# Patient Record
Sex: Female | Born: 1965 | Race: Black or African American | Hispanic: No | Marital: Married | Smoking: Never smoker
Health system: Southern US, Community
[De-identification: ages and names within clinical notes are randomized; demographics above are authoritative.]

## PROBLEM LIST (undated history)

## (undated) DIAGNOSIS — E079 Disorder of thyroid, unspecified: Secondary | ICD-10-CM

## (undated) DIAGNOSIS — E7800 Pure hypercholesterolemia, unspecified: Secondary | ICD-10-CM

## (undated) DIAGNOSIS — M1990 Unspecified osteoarthritis, unspecified site: Secondary | ICD-10-CM

## (undated) DIAGNOSIS — G43909 Migraine, unspecified, not intractable, without status migrainosus: Secondary | ICD-10-CM

## (undated) DIAGNOSIS — N939 Abnormal uterine and vaginal bleeding, unspecified: Secondary | ICD-10-CM

## (undated) DIAGNOSIS — F419 Anxiety disorder, unspecified: Secondary | ICD-10-CM

## (undated) DIAGNOSIS — I10 Essential (primary) hypertension: Secondary | ICD-10-CM

## (undated) DIAGNOSIS — M7500 Adhesive capsulitis of unspecified shoulder: Secondary | ICD-10-CM

## (undated) DIAGNOSIS — N83209 Unspecified ovarian cyst, unspecified side: Secondary | ICD-10-CM

## (undated) DIAGNOSIS — N840 Polyp of corpus uteri: Secondary | ICD-10-CM

## (undated) HISTORY — DX: Essential (primary) hypertension: I10

## (undated) HISTORY — DX: Disorder of thyroid, unspecified: E07.9

## (undated) HISTORY — PX: KNEE SURGERY: SHX244

## (undated) HISTORY — PX: COMBINED HYSTEROSCOPY DIAGNOSTIC / D&C: SUR297

## (undated) HISTORY — DX: Anxiety disorder, unspecified: F41.9

## (undated) HISTORY — DX: Migraine, unspecified, not intractable, without status migrainosus: G43.909

## (undated) HISTORY — DX: Polyp of corpus uteri: N84.0

## (undated) HISTORY — DX: Unspecified osteoarthritis, unspecified site: M19.90

## (undated) HISTORY — DX: Pure hypercholesterolemia, unspecified: E78.00

## (undated) HISTORY — PX: ROTATOR CUFF REPAIR: SHX139

## (undated) HISTORY — DX: Unspecified ovarian cyst, unspecified side: N83.209

## (undated) HISTORY — PX: WRIST SURGERY: SHX841

## (undated) HISTORY — DX: Adhesive capsulitis of unspecified shoulder: M75.00

## (undated) HISTORY — PX: INTRAUTERINE DEVICE INSERTION: SHX323

---

## 1991-03-04 HISTORY — PX: DIAGNOSTIC LAPAROSCOPY: SUR761

## 1997-06-05 ENCOUNTER — Ambulatory Visit (HOSPITAL_COMMUNITY): Admission: RE | Admit: 1997-06-05 | Discharge: 1997-06-05 | Payer: Self-pay | Admitting: Obstetrics and Gynecology

## 1997-09-19 ENCOUNTER — Ambulatory Visit (HOSPITAL_BASED_OUTPATIENT_CLINIC_OR_DEPARTMENT_OTHER): Admission: RE | Admit: 1997-09-19 | Discharge: 1997-09-19 | Payer: Self-pay | Admitting: *Deleted

## 1998-05-07 ENCOUNTER — Encounter: Payer: Self-pay | Admitting: Gastroenterology

## 1998-05-07 ENCOUNTER — Ambulatory Visit (HOSPITAL_COMMUNITY): Admission: RE | Admit: 1998-05-07 | Discharge: 1998-05-07 | Payer: Self-pay | Admitting: Gastroenterology

## 1998-09-07 ENCOUNTER — Ambulatory Visit (HOSPITAL_COMMUNITY): Admission: RE | Admit: 1998-09-07 | Discharge: 1998-09-07 | Payer: Self-pay | Admitting: Obstetrics and Gynecology

## 1999-02-03 ENCOUNTER — Encounter: Payer: Self-pay | Admitting: Gynecology

## 1999-02-03 ENCOUNTER — Inpatient Hospital Stay (HOSPITAL_COMMUNITY): Admission: AD | Admit: 1999-02-03 | Discharge: 1999-02-03 | Payer: Self-pay | Admitting: Gynecology

## 1999-03-14 ENCOUNTER — Other Ambulatory Visit: Admission: RE | Admit: 1999-03-14 | Discharge: 1999-03-14 | Payer: Self-pay | Admitting: Internal Medicine

## 1999-03-28 ENCOUNTER — Encounter: Admission: RE | Admit: 1999-03-28 | Discharge: 1999-03-28 | Payer: Self-pay | Admitting: Internal Medicine

## 1999-03-28 ENCOUNTER — Ambulatory Visit (HOSPITAL_COMMUNITY): Admission: RE | Admit: 1999-03-28 | Discharge: 1999-03-28 | Payer: Self-pay | Admitting: Infectious Diseases

## 1999-04-09 ENCOUNTER — Encounter: Admission: RE | Admit: 1999-04-09 | Discharge: 1999-04-09 | Payer: Self-pay | Admitting: Internal Medicine

## 1999-05-06 ENCOUNTER — Ambulatory Visit (HOSPITAL_COMMUNITY): Admission: RE | Admit: 1999-05-06 | Discharge: 1999-05-06 | Payer: Self-pay | Admitting: Infectious Diseases

## 1999-05-06 ENCOUNTER — Encounter: Admission: RE | Admit: 1999-05-06 | Discharge: 1999-05-06 | Payer: Self-pay | Admitting: Infectious Diseases

## 1999-05-27 ENCOUNTER — Encounter: Admission: RE | Admit: 1999-05-27 | Discharge: 1999-05-27 | Payer: Self-pay | Admitting: Internal Medicine

## 1999-06-15 ENCOUNTER — Inpatient Hospital Stay (HOSPITAL_COMMUNITY): Admission: AD | Admit: 1999-06-15 | Discharge: 1999-06-15 | Payer: Self-pay | Admitting: Gynecology

## 1999-06-21 ENCOUNTER — Encounter: Payer: Self-pay | Admitting: Gynecology

## 1999-06-21 ENCOUNTER — Inpatient Hospital Stay (HOSPITAL_COMMUNITY): Admission: AD | Admit: 1999-06-21 | Discharge: 1999-06-21 | Payer: Self-pay | Admitting: *Deleted

## 1999-07-09 ENCOUNTER — Encounter: Admission: RE | Admit: 1999-07-09 | Discharge: 1999-10-07 | Payer: Self-pay | Admitting: Gynecology

## 1999-07-23 ENCOUNTER — Inpatient Hospital Stay (HOSPITAL_COMMUNITY): Admission: AD | Admit: 1999-07-23 | Discharge: 1999-07-23 | Payer: Self-pay | Admitting: Gynecology

## 1999-08-06 ENCOUNTER — Inpatient Hospital Stay (HOSPITAL_COMMUNITY): Admission: AD | Admit: 1999-08-06 | Discharge: 1999-08-08 | Payer: Self-pay | Admitting: Gynecology

## 1999-09-14 ENCOUNTER — Inpatient Hospital Stay (HOSPITAL_COMMUNITY): Admission: AD | Admit: 1999-09-14 | Discharge: 1999-09-14 | Payer: Self-pay | Admitting: Gynecology

## 1999-09-26 ENCOUNTER — Inpatient Hospital Stay (HOSPITAL_COMMUNITY): Admission: AD | Admit: 1999-09-26 | Discharge: 1999-09-29 | Payer: Self-pay | Admitting: Gynecology

## 1999-11-14 ENCOUNTER — Other Ambulatory Visit: Admission: RE | Admit: 1999-11-14 | Discharge: 1999-11-14 | Payer: Self-pay | Admitting: Gynecology

## 2000-11-18 ENCOUNTER — Other Ambulatory Visit: Admission: RE | Admit: 2000-11-18 | Discharge: 2000-11-18 | Payer: Self-pay | Admitting: Obstetrics and Gynecology

## 2001-01-26 ENCOUNTER — Emergency Department (HOSPITAL_COMMUNITY): Admission: EM | Admit: 2001-01-26 | Discharge: 2001-01-27 | Payer: Self-pay | Admitting: Emergency Medicine

## 2001-01-26 ENCOUNTER — Encounter: Payer: Self-pay | Admitting: Emergency Medicine

## 2001-07-12 ENCOUNTER — Emergency Department (HOSPITAL_COMMUNITY): Admission: EM | Admit: 2001-07-12 | Discharge: 2001-07-12 | Payer: Self-pay | Admitting: Emergency Medicine

## 2001-07-12 ENCOUNTER — Encounter: Payer: Self-pay | Admitting: Emergency Medicine

## 2002-01-16 ENCOUNTER — Emergency Department (HOSPITAL_COMMUNITY): Admission: EM | Admit: 2002-01-16 | Discharge: 2002-01-16 | Payer: Self-pay | Admitting: Emergency Medicine

## 2002-01-16 ENCOUNTER — Encounter: Payer: Self-pay | Admitting: Emergency Medicine

## 2002-11-29 ENCOUNTER — Other Ambulatory Visit: Admission: RE | Admit: 2002-11-29 | Discharge: 2002-11-29 | Payer: Self-pay | Admitting: Obstetrics and Gynecology

## 2003-06-21 ENCOUNTER — Emergency Department (HOSPITAL_COMMUNITY): Admission: EM | Admit: 2003-06-21 | Discharge: 2003-06-21 | Payer: Self-pay | Admitting: Emergency Medicine

## 2003-11-30 ENCOUNTER — Other Ambulatory Visit: Admission: RE | Admit: 2003-11-30 | Discharge: 2003-11-30 | Payer: Self-pay | Admitting: Obstetrics and Gynecology

## 2004-12-04 ENCOUNTER — Other Ambulatory Visit: Admission: RE | Admit: 2004-12-04 | Discharge: 2004-12-04 | Payer: Self-pay | Admitting: Obstetrics and Gynecology

## 2006-01-01 ENCOUNTER — Other Ambulatory Visit: Admission: RE | Admit: 2006-01-01 | Discharge: 2006-01-01 | Payer: Self-pay | Admitting: Obstetrics and Gynecology

## 2006-03-03 HISTORY — PX: OOPHORECTOMY: SHX86

## 2006-05-15 ENCOUNTER — Ambulatory Visit (HOSPITAL_COMMUNITY): Admission: RE | Admit: 2006-05-15 | Discharge: 2006-05-15 | Payer: Self-pay | Admitting: Obstetrics and Gynecology

## 2006-05-19 ENCOUNTER — Encounter (INDEPENDENT_AMBULATORY_CARE_PROVIDER_SITE_OTHER): Payer: Self-pay | Admitting: *Deleted

## 2006-05-19 ENCOUNTER — Inpatient Hospital Stay (HOSPITAL_COMMUNITY): Admission: RE | Admit: 2006-05-19 | Discharge: 2006-05-21 | Payer: Self-pay | Admitting: Obstetrics and Gynecology

## 2007-01-25 ENCOUNTER — Other Ambulatory Visit: Admission: RE | Admit: 2007-01-25 | Discharge: 2007-01-25 | Payer: Self-pay | Admitting: Obstetrics and Gynecology

## 2007-08-24 ENCOUNTER — Ambulatory Visit (HOSPITAL_COMMUNITY): Admission: RE | Admit: 2007-08-24 | Discharge: 2007-08-24 | Payer: Self-pay | Admitting: Obstetrics and Gynecology

## 2007-09-16 ENCOUNTER — Encounter: Payer: Self-pay | Admitting: Obstetrics and Gynecology

## 2007-09-16 ENCOUNTER — Ambulatory Visit (HOSPITAL_BASED_OUTPATIENT_CLINIC_OR_DEPARTMENT_OTHER): Admission: RE | Admit: 2007-09-16 | Discharge: 2007-09-16 | Payer: Self-pay | Admitting: Obstetrics and Gynecology

## 2008-02-29 ENCOUNTER — Ambulatory Visit: Payer: Self-pay | Admitting: Obstetrics and Gynecology

## 2008-02-29 ENCOUNTER — Encounter: Payer: Self-pay | Admitting: Obstetrics and Gynecology

## 2008-02-29 ENCOUNTER — Other Ambulatory Visit: Admission: RE | Admit: 2008-02-29 | Discharge: 2008-02-29 | Payer: Self-pay | Admitting: Obstetrics and Gynecology

## 2008-06-24 ENCOUNTER — Emergency Department (HOSPITAL_COMMUNITY): Admission: EM | Admit: 2008-06-24 | Discharge: 2008-06-24 | Payer: Self-pay | Admitting: Family Medicine

## 2008-10-09 ENCOUNTER — Ambulatory Visit: Payer: Self-pay | Admitting: Obstetrics and Gynecology

## 2009-03-20 ENCOUNTER — Other Ambulatory Visit: Admission: RE | Admit: 2009-03-20 | Discharge: 2009-03-20 | Payer: Self-pay | Admitting: Obstetrics and Gynecology

## 2009-03-20 ENCOUNTER — Ambulatory Visit: Payer: Self-pay | Admitting: Obstetrics and Gynecology

## 2009-03-27 ENCOUNTER — Ambulatory Visit: Payer: Self-pay | Admitting: Obstetrics and Gynecology

## 2010-03-21 ENCOUNTER — Ambulatory Visit
Admission: RE | Admit: 2010-03-21 | Discharge: 2010-03-21 | Payer: Self-pay | Source: Home / Self Care | Attending: Obstetrics and Gynecology | Admitting: Obstetrics and Gynecology

## 2010-03-21 ENCOUNTER — Other Ambulatory Visit
Admission: RE | Admit: 2010-03-21 | Discharge: 2010-03-21 | Payer: Self-pay | Source: Home / Self Care | Admitting: Obstetrics and Gynecology

## 2010-03-21 ENCOUNTER — Other Ambulatory Visit: Payer: Self-pay | Admitting: Obstetrics and Gynecology

## 2010-06-16 ENCOUNTER — Observation Stay (HOSPITAL_COMMUNITY)
Admission: EM | Admit: 2010-06-16 | Discharge: 2010-06-17 | Disposition: A | Discharge status: Home / Self Care | Payer: 59 | Source: Ambulatory Visit | Attending: Emergency Medicine | Admitting: Emergency Medicine

## 2010-06-16 ENCOUNTER — Emergency Department (HOSPITAL_COMMUNITY): Payer: 59

## 2010-06-16 DIAGNOSIS — E119 Type 2 diabetes mellitus without complications: Secondary | ICD-10-CM | POA: Insufficient documentation

## 2010-06-16 DIAGNOSIS — E7800 Pure hypercholesterolemia, unspecified: Secondary | ICD-10-CM | POA: Insufficient documentation

## 2010-06-16 DIAGNOSIS — R079 Chest pain, unspecified: Principal | ICD-10-CM | POA: Insufficient documentation

## 2010-06-16 DIAGNOSIS — I10 Essential (primary) hypertension: Secondary | ICD-10-CM | POA: Insufficient documentation

## 2010-06-16 LAB — COMPREHENSIVE METABOLIC PANEL
ALT: 11 U/L (ref 0–35)
Albumin: 3.5 g/dL (ref 3.5–5.2)
Alkaline Phosphatase: 60 U/L (ref 39–117)
BUN: 15 mg/dL (ref 6–23)
Calcium: 9 mg/dL (ref 8.4–10.5)
Total Bilirubin: 0.6 mg/dL (ref 0.3–1.2)
Total Protein: 7.1 g/dL (ref 6.0–8.3)

## 2010-06-16 LAB — POCT CARDIAC MARKERS: Myoglobin, poc: 55.5 ng/mL (ref 12–200)

## 2010-06-16 LAB — CBC
HCT: 36.1 % (ref 36.0–46.0)
MCHC: 33.5 g/dL (ref 30.0–36.0)
MCV: 82.2 fL (ref 78.0–100.0)
RBC: 4.39 MIL/uL (ref 3.87–5.11)
WBC: 8 10*3/uL (ref 4.0–10.5)

## 2010-06-17 DIAGNOSIS — R072 Precordial pain: Secondary | ICD-10-CM

## 2010-06-17 LAB — POCT CARDIAC MARKERS
CKMB, poc: <1 ng/mL — ABNORMAL LOW (ref 1.0–8.0)
CKMB, poc: <1 ng/mL — ABNORMAL LOW (ref 1.0–8.0)
Myoglobin, poc: 35.1 ng/mL (ref 12–200)
Troponin i, poc: <0.05 ng/mL (ref 0.00–0.09)

## 2010-07-16 NOTE — Op Note (Signed)
NAME:  [name], [name]             ACCOUNT NO.:  [id number]   MEDICAL RECORD NO.:  [id number]          PATIENT TYPE:  AMB   LOCATION:  [facility]                         FACILITY:  [facility]   PHYSICIAN:  Daniel L. Gottsegen, M.D.DATE OF BIRTH:  1965/11/01   DATE OF PROCEDURE:  09/16/2007  DATE OF DISCHARGE:                               OPERATIVE REPORT   PREOPERATIVE DIAGNOSES:  1. Dysfunctional uterine bleeding.  2. Endometrial polyp.   POSTOPERATIVE DIAGNOSES:  1. Dysfunctional uterine bleeding.  2. Endometrial polyp.   OPERATION:  1. D&C.  2. Hysteroscopy.  3. Insertion of Marina IUD.  4. Excision of endometrial polyp.   SURGEON:  Dr. Eda Paschal   ANESTHESIA:  General.   INDICATIONS:  The patient is a 45 year old gravida 2, para 1, AB 1 who  had presented in the office with dysfunctional uterine bleeding with an  IUD in place.  She underwent a saline infusion histogram.  At the time  of the saline infusion histogram, it appeared that she was expelling her  [name], and the IUD was actually in the upper endocervical canal.  It was removed; however, she also had an endometrial polyp, and this was  seen at the time of saline infusion histogram.  She now enters the  hospital for excision of endometrial polyp as well as reinsertion of her  [name]   FINDINGS:  External is normal; BUS is normal; vaginal is normal; cervix  is clean.  Uterus is normal size and shape without any descensus.  Adnexa failed to reveal any masses.  At the time of hysteroscopy, the  patient had 2 endometrial polyps.  One was coming off the anterior wall  of the fundus and was about 1 cm.  One was little bit less than that and  was coming off the posterior wall of the fundus.  Other than this, top  of the fundus, tubal ostia, anterior and posterior walls of the fundus,  lower uterine segment and endocervical canal were free of disease.   PROCEDURE:  After adequate general endotracheal anesthesia, the  patient  was placed in the dorsal lithotomy position, prepped and draped in the  usual sterile manner.  A single-tooth tenaculum was placed in the  anterior lip of the cervix.  The cervix was dilated to a #31 Pratt  dilator.  A hysteroscopic resectoscope was introduced; 3% sorbitol was  used to expand each intrauterine cavity, and a camera was used for  magnification.  Using a 90-degree wire loop, the 2 polyps could be  sharply excised.  Endometrial curettings were also obtained.  She was  rehysteroscoped at this  point, and the cavity appeared to be completely clean.  Fluid deficit  was negligible.  Blood loss was minimal.  After the procedure been  terminated, a [name] was inserted without difficulty.  The patient  left the operating room in satisfactory condition.      Daniel L. Eda Paschal, M.D.  Electronically Signed     DLG/MEDQ  D:  09/16/2007  T:  09/16/2007  Job:  [id number]

## 2010-07-19 NOTE — H&P (Signed)
[facility]  Patient:    [name], [name]                    MRN: [id number] Adm. Date:  [id number] Attending:  Tonye Royalty                         History and Physical  CHIEF COMPLAINT:                  Low abdominal pressure.  HISTORY OF PRESENT ILLNESS:       The patient is a 45 year old, gravida 2, para 0, AB 1, with last menstrual period January 05, 1999, estimated date of confinement October 11, 1999.  The patient currently is 34-1/2 weeks estimated gestational age.  She presented to [facility] complaining of low abdominal pressure earlier today.  The patient has a known history of preterm labor for which she had been on terbutaline 2.5 mg p.o. q.4h.  She was placed on the monitor and was found to have contractions every 3 to 7 minutes apart. She was given p.o. fluids.  She was given terbutaline 0.25 mg subcutaneously and repeated administered at 1522 hours and then repeated at 1655 hours, and she still had some irregular contractions that were noted, and the patient was symptomatic.  Her pelvic examination demonstrated cervix to be long, closed, and posterior.  There was some thick white discharge for which a wet prep GC and chlamydia culture was obtained.  Of note, the patient also had been treated for urinary tract infection with Augmentin and afterwards was having a vaginal discharge and was found to have bacterial vaginosis for which she completed treatment with MetroGel approximately two days ago.  The patient also has a history of chronic hypertension for which she is on Aldomet 250 mg b.i.d. and has done well.  Her blood pressure on arrival to the hospital was 109/68.  PRENATAL COURSE:                  Significant for the fact that she had a therapeutic abortion in 1987.  She also had a false positive HIV for which she was followed in consultation with infectious disease at [facility]. The patient also, during  this pregnancy, developed gestational diabetes that did not respond to diet and was placed on insulin, and she currently takes 4 units of NPH at bedtime.  ALLERGIES:                        SULFA and CLINDAMYCIN.  REVIEW OF SYSTEMS:                See Hollister form.  PHYSICAL EXAMINATION:  VITAL SIGNS:                      Blood pressure 109/68, temperature 97.9, pulse 96, respirations 20.  HEENT:                            Unremarkable.  NECK:                             Supple.  Trachea midline.  No carotid bruits, no thyromegaly.  LUNGS:  Clear to auscultation without rhonchi or wheezes.  HEART:                            Regular rate and rhythm.  No murmurs or gallops.  BREASTS:                          Exam done in the first trimester and reported to be normal.  ABDOMEN:                          Gravid uterus.  Positive fetal heart tones.  PELVIC:                           Cervix long, closed, and posterior.  Thick, white vaginal discharge.  EXTREMITIES:                      Trace edema.  DTRs 1+, negative clonus.  PRENATAL LABORATORY DATA:         B positive blood type, negative antibody screen.  Sickle cell trait was negative.  VDRL was nonreactive.  Rubella with evidence of immunity.  Hepatitis B surface antigen was negative.  HIV initially thought positive, on repeat was negative.  Pap smear was normal. Alpha-fetoprotein was normal and patient with abnormal diabetes screen and three-hour GTP.  ASSESSMENT:                       A 45 year old gravida 2, para 0, AB 1 at 30-1/2 weeks estimated gestational age, not responding to outpatient treatment with oral tocolytics.   She will be admitted for IV hydration and magnesium sulfate tocolysis with 4 g bolus followed by 2 g per hour.  GC and chlamydia wet prep obtained.  Since patient was recently treated with Augmentin, will hold off on placing her on any antibiotic until the cultures become  available unless there is any evidence of any imminent delivery or anything shows differently on the wet prep.  We will continue to monitor her blood sugars in the hospital consisting of fasting blood sugar and two-hour postprandial, and she will receive her 4 units of NPH this evening.  We will hold off her p.o. terbutaline.  She will continue with her p.o. prenatal vitamins and iron supplementation due to her recently diagnosed anemia and continue her Aldomet 250 mg b.i.d. for hypertension.  The patients fetal heart rate tracing was found to be reassuring.  All this was related to the patient and will follow accordingly.  PLAN:                             As per Assessment above. DD:  08/06/99 TD:  08/06/99 Job: [id number]

## 2010-07-19 NOTE — Op Note (Signed)
[place]  Patient:    SKYLLAR, NOTARIANNI Visit Number: [id number] MRN: [id number]          Service Type: EMS Location: ED Attending Physician:  Sandi Raveling Proc. Date: 09/19/97 Admit Date:  01/26/2001 Discharge Date: 01/27/2001   CC:         Jearld Adjutant, M.D.             Dario Guardian, M.D.                           Operative Report  PREOPERATIVE DIAGNOSIS:  Left knee medial meniscus tear, rule out medial plica and chondromalacia patella.  POSTOPERATIVE DIAGNOSIS: 1. Large inflamed medial and lateral plicae. 2. 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  OPERATION:  Left knee operative arthroscopy with excision medial and lateral plicae with arthroscopic lateral retinacular release.  SURGEON:  Jearld Adjutant, M.D.  ASSISTANT:  Currie Paris. Thedore Mins.  ANESTHESIA:  General with LMA.  CULTURES:  None.  DRAINS:  None.  ESTIMATED BLOOD LOSS:  Without.  TOURNIQUET TIME: without.  PATHOLOGIC FINDINGS AND HISTORY:   [place] came in to [place] with a new problem with her knee August 09, 1997.  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  She went to [employer] at [employer]. Examination revealed range of motion from 10 degrees to 70 degrees when the knee was relaxed.  [place]rays was difficult because of pain.  Tender at both joint lines.  Had a 1-2+ effusion, negative Lachman, negative post-anterior drawer, stable to varus, valgus stressing.  Could not do a pivot-shift initially.  X-rays were negative.  We felt she had an internal derangement.  We needed to rule out a medial meniscus tear, data at ACL.  We injected the med portal with 3:12 Depo Medrol/Marcaine.  Did not aspirate any blood or any significant joint fluid.  Put her in a knee  immobilizer, put her Vicodin and sent her to physical therapy.  She came back in one week.  She was unable to bear weight and was having difficulty bending the knee, made zero progress in physical therapy.  Unable to weight bear, pain medially deep inside the joint.  Exam was still range of motion only 10-80 degrees, no laxity of her ligaments and we obtained a MRI scan and held on physical therapy.  She came back August 25, 1997.  MRI scan was basically normal.  The only abnormality being thinning of the tibial articular cartilage, possibly degenerative in nature.  The medial lateral menisci appeared normal and there was no cruciate ligament tear.  She complained of pain with weight bearing of the knee and came in with crutches and a knee immobilizer.  There was no ACL laxity.  She could flex the knee to about 100 degrees, we still thought she could have an internal derangement.  We placed her on Vioxx, knee immobilizer and saw her back in 10 more days. By September 06, 1997, she came back in, had gone to physical therapy, no change in range of motion or pain.  Slight decrease in swelling, still had marked tenderness at the medial joint line over the medial femoral condyle and medial retinaculum.  Our physical therapy stated, she made no progress in  physical therapy. The knee gave way over the weekend.  She had pain with flexion past 100 degrees in the medial compartment, no significant effusion.  I felt there was a component of chondromalacia and I thought she either had a medial meniscus tear versus an inflamed medial plica.  She had made absolutely no progress despite aggressive conservative treatment and we elected to proceed with diagnostic and operative arthroscopy.  At surgery today, we found a huge, thickened, inflamed medial and lateral plica in the medial lateral gutters that had a lot of adipose tissue in it and a fibrous base.  This also came into the medial joint line  and corresponded with her area of maximal tenderness.  This was all shaved out on both sides.  The joint surfaces all looked good, except for grade I chondromalacia patella with softening of the lateral facet, obvious tilting and lateral tracking and a very tight lateral retinaculum.  ACL was intact.  Both menisci were intact.  We did an arthroscopic lateral retinacular release.  DESCRIPTION OF PROCEDURE:  With adequate anesthesia obtained using LMA technique and 1 g Ancef given IV prophylaxis, the patient was placed in the supine position.  The left lower extremity was prepped from the malleoli to the leg holder in a standard fashion.  After standard prepping and draping, a superolateral inflow portal is made.  The knee was insufflated with normal saline with the arthroscopic pump.  Medial, lateral scope portals were then made, the joint was thoroughly inspected. We then shaved out the medial plica cauterized bleeding points, probed the ACL and medial meniscus.  Reversed portals, shaved out the lateral plica, cauterized bleeding point, assessed the tilt and track of the lateral patella, as well as, the posterior surface and then did a lateral retinacular release with arthroscopic Bovie from the vastus lateralis to the joint line. Good release was obtained.  We then Bovied the bleeding points, the knee was irrigated through the scope.   Then 0.5% Marcaine was injected in and about the portals.  The portals were left open.  A bulky, sterile, compressive dressing was applied with lateral compression for tamponade and the patient, having tolerated the procedure well was awakened, taken to recovery room in satisfactory condition to be discharged per outpatient routine, crutches and weight bearing as tolerated.  Told to call the office for recheck tomorrow. Attending Physician:  Sandi Raveling DD:  09/19/97 TD:  09/19/97 Job: [id number]

## 2010-07-19 NOTE — Consult Note (Signed)
[facility]  Patient:    [name], [name]                    MRN: [id number] Adm. Date:  [id number] Attending:  Merrily Pew                          Consultation Report  CHIEF COMPLAINT:              Contractions.  HISTORY OF PRESENT ILLNESS:   The patient is a 45 year old, G2, P0, AB 1, female, [redacted] weeks gestation.  Pregnancy complicated by chronic hypertension, on Aldomet 250 b.i.d. and gestational diabetes on NPH 10 units at bedtime. History of preterm labor requiring magnesium sulfate and subsequent oral terbutaline which she stopped this past week.  The patient notes since last evening she has been contracting and presents for evaluation.  She denies rupture of membranes or bleeding.  PAST MEDICAL HISTORY:         Elevated blood pressure.  PAST SURGICAL HISTORY:        TAB.  Knee surgery.  ALLERGIES:                    SULFA, CLINDAMYCIN.  REVIEW OF SYSTEMS:            Noncontributory.  ADMISSION PHYSICAL EXAMINATION:  VITAL SIGNS:                  Temperature 98, blood pressure 119/81, respirations 20, pulse 86.  HEENT:                        Normal.  LUNGS:                        Clear.  CARDIAC:                      Regular rate.  No murmurs, rubs or gallops.  ABDOMEN:                      Gravid, vertex presentation.  PELVIC:                       Uterus is soft, nontender.  External monitor shows a reactive fetal tracing with contractions every 3-7 minutes.  Pelvic examination:  Cervix 50%, closed, -2, -3 station, vertex presentation.  No evidence of rupture of membranes or bleeding.  ASSESSMENT AND PLAN:          A 45 year old G2, P0, AB 1 female, [redacted] weeks gestation with a history of preterm contractions requiring tocolysis, recently stopped several days ago with contractions every 4-7 minutes.  The patient was initially checked three hours ago and was walked and re-monitored and now on recheck her cervix has  remained unchanged.  I reviewed with the patient this may be prodromal labor in the early labor versus false labor and reviewed the various options with her and we elected for a trial of one shot of subcu terbutaline, subsequent monitoring, and then discharge home where she lives approximately 5-8 minutes from here with Ambien 10 mg for attempted therapeutic rest.  The patient has an appointment in 48 hours in the office. I rescheduled for an office visit and NST.  If her contractions resolve and she is otherwise doing well with good fetal movement, she will monitor  and present 48 hours for reevaluation.  If contractions continue and increase in intensity she will represent for further evaluation.  Certainly if any evidence of rupture of membranes, bleeding or any other concerning symptoms such as decreased fetal movement then she knows to present ASAP for evaluation. DD:  09/14/99 TD:  09/14/99 Job: [id number]

## 2010-07-19 NOTE — Discharge Summary (Signed)
[facility]  Patient:    [name], [name]                    MRN: [id number] Adm. Date:  [id number] Disc. Date: [id number] Attending:  Tonye Royalty Dictator:   Antony Contras, [facility]                           Discharge Summary  DISCHARGE DIAGNOSES:          1. Intrauterine pregnancy at 34-1/2 weeks.                               2. Preterm labor not responding to outpatient                                  treatment with oral tocolytics.  HISTORY OF PRESENT ILLNESS:   The patient is a 45 year old, gravida 2, para 0-0-1-0, with an LMP of January 05, 1999, and an [facility] of October 11, 1999. The pregnancy has been complicated by chronic hypertension.  The patient takes Aldomet 250 mg b.i.d.  She also has gestational diabetes and preterm labor for which she has been prescribed terbutaline 2.5 mg p.o. q.4h.  PRENATAL LABORATORY DATA:     Blood type B+.  Sickle cell negative.  Antibody screen negative.  Rubella immune.  RPR, HBSAG, and HIV nonreactive.  MSAFP within normal limits.  HOSPITAL COURSE:              The patient presented to [facility] on August 06, 1999, complaining of low abdominal pressure.  She was placed on the monitor and was found to have contractions every three to seven minutes.  She was given p.o. fluids and terbutaline 0.25 mg subcutaneously, which was repeated x 2.  She continued to have some irregular contractions.  On pelvic exam, her cervix was long, thick, and closed.  At this point, she was admitted for IV hydration and magnesium sulfate tocolysis with a 4 g bolus followed by 2 g/hr.  GC, chlamydia, and wet prep were also obtained.  She will be continued on her 4 units of NPH Insulin which she received in the evening and her Aldomet 250 mg b.i.d.  The patient did respond to this treatment.  Her magnesium sulfate was to be discontinued on August 07, 1999.  Her cervical exam remained unchanged.  She was able to be discharged  on her second hospital day in satisfactory condition.  LABORATORY DATA:              CMET:  Normal.  CBC:  Hemoglobin 11.8, hematocrit 36.2, white blood cell count 10.1.  Her fasting blood sugar was 99 and two-hour of 105, 78, and 103.  DISCHARGE MEDICATIONS:        The patient is to continue her terbutaline q.4h. at home and continue her Aldomet.  FOLLOW-UP:                    Follow up in the office in one week. DD:  08/23/99 TD:  08/24/99 Job: [id number]

## 2010-07-19 NOTE — Discharge Summary (Signed)
[name], [name]             ACCOUNT NO.:  [id number]   MEDICAL RECORD NO.:  [id number]          PATIENT TYPE:  INP   LOCATION:  9317                          FACILITY:  [facility]   PHYSICIAN:  Daniel L. Gottsegen, M.D.DATE OF BIRTH:  10-05-1965   DATE OF ADMISSION:  05/19/2006  DATE OF DISCHARGE:  05/21/2006                               DISCHARGE SUMMARY   The patient is a 45 year old female with severe left lower quadrant pain  and large left ovarian cyst who was admitted to the hospital for  definitive surgery.  The patient had previous laparoscopy, had dense  pelvic adhesions on the left, and previous surgical comments were that  she required salpingo-oophorectomy at some point that it should be done  open.   HOSPITAL COURSE:  Because of the above she was admitted for exploratory  laparotomy with left salpingo-oophorectomy.  Findings at the time of  surgery were large endometrioma of the left ovary. Findings were  confined to the left adnexa and she underwent a left salpingo-  oophorectomy.  Postoperatively she did well and by the second  postoperative day she was ready for discharge.   DISCHARGE MEDICATIONS:  Vicodin 5/500 for pain relief. Regular diet and  ambulatory activity.  She will return to office in 3 weeks.  Wound care  is routine. She has a subcuticular suture present.   DISCHARGE DIAGNOSIS:  Endometrioma left ovary.   OPERATION:  Exploratory laparotomy with left salpingo-oophorectomy.  Pathology report not available at time of dictation.   CONDITION ON DISCHARGE:  Is improved.      Daniel L. Eda Paschal, M.D.  Electronically Signed     DLG/MEDQ  D:  05/21/2006  T:  05/21/2006  Job:  [id number]

## 2010-07-19 NOTE — Op Note (Signed)
[name], [name]             ACCOUNT NO.:  [id number]   MEDICAL RECORD NO.:  [id number]          PATIENT TYPE:  AMB   LOCATION:  SDC                           FACILITY:  [facility]   PHYSICIAN:  Daniel L. Gottsegen, M.D.DATE OF BIRTH:  1966-03-01   DATE OF PROCEDURE:  05/19/2006  DATE OF DISCHARGE:                               OPERATIVE REPORT   PREOPERATIVE DIAGNOSIS:  Left lower quadrant pain with left ovarian  cyst.   POSTOPERATIVE DIAGNOSIS:  Left lower quadrant pain with endometrioma of  left ovary.   OPERATION:  Exploratory laparotomy with left salpingo-oophorectomy.   SURGEONS:  Dr. Eda Paschal   FIRST ASSISTANT:  Dr. Lily Peer.   FINDINGS AT SURGERY:  The patient's left ovary was enlarged to  approximately 6 cm. In elevating in order to see the pathology the cyst  ruptured and drained obvious endometrioma type fluid.  The ovary and  tube were actually fairly free as opposed to how they had been at her  two previous laparoscopies. The uterus was normal mobile without any  pathology. Right ovary and tube were normal and mobile without any  pathology.  All endometriosis was limited to both the left ovary and  also there was what appeared to be burned out endometrial implants on  her sigmoid colon.  These could be teased off the sigmoid colon. There  was no evidence of diverticulitis. Ileocecal junction was identified.  She had normal appendix. Right and left upper quadrant were explored and  were normal.   PROCEDURE:  After adequate general endotracheal anesthesia the patient  was placed in supine position, prepped and draped in usual sterile  manner.  A Foley catheter was inserted in the patient's bladder.  The  patient had two previous laparoscopies and the surgeons note on the  second was that if she required salpingo-oophorectomy because of dense  adherence of sigmoid colon which had been taken down at second  laparoscopy, she should have a laparotomy and not  laparoscopic S&O.  As  a result of this a Pfannenstiel incision was made.  The fascia was  opened transversely.  The peritoneum was entered vertically.  Subcutaneous bleeders were clamped and bovied as encountered.  When the  peritoneal cavity was opened there was obvious enlargement the left  ovary.  It was adherent behind the uterus and the surgeon put his hand  underneath, elevated the cyst which ruptured endometriotic fluid  drained. The entire ovary and tube however were mobile and free from the  bowel. The round ligament was sutured with #1 chromic catgut.  The IP  ligament was identified, clamped and doubly suture with #1 chromic  catgut.  The ureter had been identified and was nowhere near the above.  The rest of the attachments of the ovary and tube to the broad ligament  and the uterus were clamped, cut and suture ligated with #1 chromic  catgut.  The specimen was sent to pathology for tissue diagnosis.  This  was the left ovary and tube.  There was some endometriotic implant on  the serosa of the sigmoid colon which could be teased off.  There was no  active disease and this was done. Exploration of the cul-de-sac,  vesicouterine fold of peritoneum, right ovary and tube failed to reveal  any other disease.  The rest of the abdomen was explored and was normal.  Two sponge counts were counts were correct.  Copious irrigation was done  with sterile saline. Peritoneum was closed with a running 0 Vicryl.  Sub  and super fascial spaces were copiously irrigated and the fascia was  closed with two running 0 Vicryls.  The skin was closed with a 3-0 plain  subcuticular suture.  Estimated blood loss for entire procedure was 100  mL with none replaced.  The patient tolerated procedure well and left  the operating  room in satisfactory condition draining clear urine from  her Foley catheter which was then removed.      Daniel L. Eda Paschal, M.D.  Electronically Signed     DLG/MEDQ  D:   05/19/2006  T:  05/19/2006  Job:  [id number]

## 2010-07-19 NOTE — Discharge Summary (Signed)
[facility]  Patient:    [name], [name]                    MRN: [id number] Adm. Date:  [id number] Disc. Date: [id number] Attending:  Merrily Pew Dictator:   Antony Contras, [facility]                           Discharge Summary  DISCHARGE DIAGNOSES:          1. Intrauterine pregnancy at 37-38 weeks.                               2. History of chronic hypertension.                               3. Insulin-dependent gestational diabetes.                               4. Preterm labor during the current pregnancy.  PROCEDURES:                   Vacuum-assisted vaginal delivery of viable infant over a midline episiotomy.  HISTORY OF PRESENT ILLNESS:   The patient is a 45 year old gravida 2, para 0-0-1-0, with LMP January 05, 1999, and [facility] October 11, 1999.  Prenatal risk factors included chronic hypertension, insulin-dependent gestational diabetes. The patient also had a false-positive HIV test which was negative on repeat, and she did receive follow-up per infectious disease clinic at [facility]. Other prenatal laboratories were as follows:  Blood type was B positive, antibody screen negative, sickle cell negative.  RPR, HBsAg, and HIV nonreactive.  MSAFP normal.  HOSPITAL COURSE:              The patient was admitted on September 26, 1999, with spontaneous rupture of membranes, increasing contractions, and clear fluid. The cervix on admission was tight fingertip, 50% effaced, -2 station.  She was augmented with Pitocin and progressed to complete dilatation and delivered an Apgars 8/9 female infant weighing 6 pounds 6 ounces over a midline episiotomy per vacuum-assisted vaginal delivery.  Postpartum course was normal.  She remained afebrile, had no difficulty with voiding.  CBC:  Hematocrit 30.6, hemoglobin 10.0, platelets 228.  Fasting blood sugar 59.  She was discharged on her second postpartum day.  CONDITION ON DISCHARGE:        Satisfactory.  FOLLOW-UP:                    In the office in six weeks.  MEDICATIONS:                  Continue with prenatal vitamins and iron. Motrin and Tylox for pain.  Continue with Aldomet 250 mg b.i.d. DD:  10/18/99 TD:  10/20/99 Job: [id number]

## 2010-07-19 NOTE — Consult Note (Signed)
[facility]  Patient:    [name], [name]                    MRN: [id number] Adm. Date:  [id number] Disc. Date: [id number] Attending:  Tonye Royalty                          Consultation Report  EMERGENCY ROOM CONSULTATION  HISTORY:                      The patient is a 45 year old with an estimated date of confinement of October 11, 1999 who is currently at 20-5/[redacted] weeks gestation and presented to the emergency room at [facility] this evening complaining of contractions and lower abdominal pressure which started at approximately 1400 hours at home this afternoon and worsened.  She was placed on the monitor and found to be contracting every three minutes apart.  Fetal heart rate baseline is 150 beats/minutes.  Her vital signs were as follows: temperature is 98.4, pulse 96, respirations 20 and blood pressure 128/69.  Patient with a history of chronic hypertension and had been on Aldomet 250 mg b.i.d. during her pregnancy.  Also, she was recently started on Augmentin 875 mg b.i.d. of a urinary tract infection.  When the patient was placed on the monitor, besides the contractions that were noted, a reassuring fetal heart tracing was noted.  The patient, with this pregnancy, has had bacterial vaginosis, which was treated earlier.  PHYSICAL EXAMINATION:  ABDOMEN:                      Soft and nontender.  PELVIC EXAM:                  Cervix was long, closed and posterior.  LABORATORY DATA:              Urinalysis was essentially negative, specific gravity was 1.005.  Her wet prep demonstrated a few bacteria and GC and Chlamydia as well as group B Strep culture are pending at the time of this dictation.  EMERGENCY DEPARTMENT COURSE:  The patient was given 500 cc of lactated Ringers and terbutaline 0.25 mg subcu every 20 minutes apart x 3, which arrested her contractions.  The remaining IV fluid will be administered to the patient and  she will be discharged home.  She will be released on terbutaline 2.5 mg p.o. q.4h. and was instructed to follow up in the office at the end of the week.  She is currently working a half-day schedule consisting of four hours per day and was given a note to stay off work for the remainder of this week, and then return to four hours per day if the patient feels comfortable and is no longer contracting.  Signs, symptoms and warning signs of premature contractions and premature labor were discussed with the patient so she is aware of same.  All questions were answered and we will follow accordingly. DD:  07/23/99 TD:  07/24/99 Job: [id number]

## 2010-07-19 NOTE — H&P (Signed)
[facility]  Patient:    [name], [name]                    MRN: [id number] Adm. Date:  [id number] Attending:  Merrily Pew                         History and Physical  CHIEF COMPLAINT:              Labor, spontaneous rupture of membranes.  HISTORY OF PRESENT ILLNESS:   A 44 year old G2 P0 AB1 female at 84-[redacted] weeks gestation being followed for history of chronic hypertension and insulin-dependent gestational diabetes, normal antepartum testing, with spontaneous rupture of membranes following increasing contractions today. Fluid was noted to be clear and she was confirmed to be ruptured in the office and was sent to the hospital for admission.  OBSTETRICAL HISTORY:          Significant for apparent false positive HIV screening.  She initially had a positive HIV screening with an indeterminate Western blot to follow-up.  She was seen by Dr. Cliffton Asters in the infectious disease clinic and subsequently had a negative HIV EIA, and his recommendation was no further testing or any need for empiric antiretroviral therapy.  PAST MEDICAL HISTORY:         Hypertension.  PAST SURGICAL HISTORY:        TAB.  ALLERGIES:                    SULFA, CLINDAMYCIN.  MEDICATIONS:                  1. Aldomet 250 b.i.d.                               2. NPH insulin 10 units at bedtime.  REVIEW OF SYSTEMS:            Noncontributory.  SOCIAL HISTORY:               Noncontributory.  FAMILY HISTORY:               Noncontributory.  ADMISSION PHYSICAL EXAMINATION:  VITAL SIGNS:                  Afebrile, vital signs stable.  HEENT:                        Normal.  LUNGS:                        Clear.  CARDIAC:                      Regular rate, no rubs, murmurs, or gallops.  ABDOMEN:                      Benign, gravid.  Vertex fetus, positive fetal heart tones.  PELVIC:                       Cervix tight fingertip, 50% effaced -2 station, gross  rupture of membranes, fluid noted to be clear.  ASSESSMENT:                   A 45 year old G2 P0 AB1 female, gestational diabetes, insulin-dependent with good control, chronic hypertensive on  Aldomet 250 b.i.d. with normal blood pressures, early labor with spontaneous rupture of membranes.  History of false positive HIV, no recommended therapy or intervention.  Will admit, labor, and deliver. DD:  09/26/99 TD:  09/27/99 Job: [id number]

## 2010-10-08 ENCOUNTER — Encounter: Payer: Self-pay | Admitting: *Deleted

## 2010-10-08 ENCOUNTER — Ambulatory Visit (INDEPENDENT_AMBULATORY_CARE_PROVIDER_SITE_OTHER): Payer: 59 | Admitting: Obstetrics and Gynecology

## 2010-10-08 ENCOUNTER — Encounter: Payer: Self-pay | Admitting: Obstetrics and Gynecology

## 2010-10-08 DIAGNOSIS — N949 Unspecified condition associated with female genital organs and menstrual cycle: Secondary | ICD-10-CM

## 2010-10-08 DIAGNOSIS — N938 Other specified abnormal uterine and vaginal bleeding: Secondary | ICD-10-CM

## 2010-10-08 NOTE — Progress Notes (Signed)
The patient came to see me today with a history of spotting for the last 7-10 days. She has had a [name] now for 3 years. After the first year she never saw any bleeding so this is a change.  Her abdomen is soft without guarding rebound or masses. Pelvic: External and BUN is within normal limits. Vaginal exam within normal limits. Cervix is clean with IUD string visible. The IUD is not sitting in the cervix. Uterus is normal size and shape. Adnexa failed to reveal any masses.  Assessment: Vaginal spotting  Plan: Patient reassured. If spotting persists she will call we will cycle for one month with birth control pills. She is hypertensive but I think one-month pills will be fine. I told the last thing we will do would be an ultrasound.

## 2011-01-22 ENCOUNTER — Ambulatory Visit (INDEPENDENT_AMBULATORY_CARE_PROVIDER_SITE_OTHER): Payer: 59 | Admitting: Obstetrics and Gynecology

## 2011-01-22 ENCOUNTER — Encounter: Payer: Self-pay | Admitting: Obstetrics and Gynecology

## 2011-01-22 DIAGNOSIS — N760 Acute vaginitis: Secondary | ICD-10-CM

## 2011-01-22 DIAGNOSIS — N898 Other specified noninflammatory disorders of vagina: Secondary | ICD-10-CM

## 2011-01-22 DIAGNOSIS — B373 Candidiasis of vulva and vagina: Secondary | ICD-10-CM

## 2011-01-22 DIAGNOSIS — B9689 Other specified bacterial agents as the cause of diseases classified elsewhere: Secondary | ICD-10-CM

## 2011-01-22 DIAGNOSIS — A499 Bacterial infection, unspecified: Secondary | ICD-10-CM

## 2011-01-22 MED ORDER — METRONIDAZOLE 0.75 % VA GEL: 1.0000 | Freq: Two times a day (BID) | VAGINAL | Status: AC

## 2011-01-22 MED ORDER — FLUCONAZOLE 150 MG PO TABS: 150.0000 mg | ORAL_TABLET | Freq: Once | ORAL | Status: AC

## 2011-01-22 NOTE — Progress Notes (Signed)
Patient came to see me today with a three-week history of vaginal discharge with odor. She is now starting to have some spotting vaginally again but she is due to have a [name] changed in January and we discussed that that probably the reason for the above. She is having no itching. She has not been on antibiotics.  Pelvic exam: Beola Cord present. External: Within normal limits no vulvitis. BUS within normal limits. Vaginal exam: Normal estrogen effect, some blood present from cervical os. Wet prep positive for yeast, amine, includes cells. Cervix: Clean with IUD string visible.  Assessment: Bacterial vaginosis. Yeast vaginitis.  Plan: MetroGel vaginal cream one applicatorful at bedtime in the vagina for 5 days. Diflucan 150 mg daily for 4 days. Patient to call in December to set up approval for new [name].

## 2011-02-14 ENCOUNTER — Telehealth: Payer: Self-pay | Admitting: *Deleted

## 2011-02-14 NOTE — Telephone Encounter (Signed)
Pt called stating she has been having bad hot flashes during the daytime for about a week now. She use to have hot flashes at night but now only daytime. Pt wanted to know what she should do? Pt informed to make ov to see G once her returns in the office. Pt okay with this

## 2011-03-06 ENCOUNTER — Telehealth: Payer: Self-pay | Admitting: *Deleted

## 2011-03-06 ENCOUNTER — Other Ambulatory Visit: Payer: Self-pay | Admitting: *Deleted

## 2011-03-06 DIAGNOSIS — Z3049 Encounter for surveillance of other contraceptives: Secondary | ICD-10-CM

## 2011-03-06 MED ORDER — LEVONORGESTREL 20 MCG/24HR IU IUD: INTRAUTERINE_SYSTEM | Freq: Once | INTRAUTERINE | Status: DC

## 2011-03-06 NOTE — Telephone Encounter (Signed)
Patient informed benefits for [name].  Covered at 100%.  Patient has appt coming up on 03/12/11.

## 2011-03-12 ENCOUNTER — Encounter: Payer: 59 | Admitting: Obstetrics and Gynecology

## 2011-03-12 ENCOUNTER — Ambulatory Visit (INDEPENDENT_AMBULATORY_CARE_PROVIDER_SITE_OTHER): Payer: 59 | Admitting: Obstetrics and Gynecology

## 2011-03-12 ENCOUNTER — Encounter: Payer: Self-pay | Admitting: Obstetrics and Gynecology

## 2011-03-12 DIAGNOSIS — Z3049 Encounter for surveillance of other contraceptives: Secondary | ICD-10-CM

## 2011-03-12 DIAGNOSIS — Z30431 Encounter for routine checking of intrauterine contraceptive device: Secondary | ICD-10-CM

## 2011-03-12 NOTE — Progress Notes (Signed)
Patient came today to have her [name] removed after 5 years and have a new one inserted.  Pelvic exam: Kennon Portela present. External within normal limits. Vaginal exam within normal limits. Cervix clean with IUD string visible. Uterus normal size and shape. Adnexa fails to reveal masses.  Assessment: Normal GYN exam with IUD present.  Plan: [name] removed. New [name] inserted with ease. Patient will return in several weeks for yearly visit.

## 2011-03-24 ENCOUNTER — Other Ambulatory Visit (HOSPITAL_COMMUNITY)
Admission: RE | Admit: 2011-03-24 | Discharge: 2011-03-24 | Disposition: A | Discharge status: Home / Self Care | Payer: 59 | Source: Ambulatory Visit | Attending: Obstetrics and Gynecology | Admitting: Obstetrics and Gynecology

## 2011-03-24 ENCOUNTER — Ambulatory Visit (INDEPENDENT_AMBULATORY_CARE_PROVIDER_SITE_OTHER): Payer: 59 | Admitting: Obstetrics and Gynecology

## 2011-03-24 ENCOUNTER — Encounter: Payer: Self-pay | Admitting: Obstetrics and Gynecology

## 2011-03-24 VITALS — BP 124/76 | Ht 59.0 in | Wt 180.0 lb

## 2011-03-24 DIAGNOSIS — E7800 Pure hypercholesterolemia, unspecified: Secondary | ICD-10-CM | POA: Insufficient documentation

## 2011-03-24 DIAGNOSIS — Z01419 Encounter for gynecological examination (general) (routine) without abnormal findings: Secondary | ICD-10-CM | POA: Insufficient documentation

## 2011-03-24 DIAGNOSIS — N951 Menopausal and female climacteric states: Secondary | ICD-10-CM

## 2011-03-24 DIAGNOSIS — E785 Hyperlipidemia, unspecified: Secondary | ICD-10-CM | POA: Insufficient documentation

## 2011-03-24 DIAGNOSIS — Z780 Asymptomatic menopausal state: Secondary | ICD-10-CM

## 2011-03-24 LAB — URINALYSIS W MICROSCOPIC + REFLEX CULTURE
Bilirubin Urine: NEGATIVE
Crystals: NONE SEEN
Glucose, UA: NEGATIVE mg/dL
Nitrite: NEGATIVE
pH: 7.5 (ref 5.0–8.0)

## 2011-03-24 LAB — TSH: TSH: 2.182 u[IU]/mL (ref 0.350–4.500)

## 2011-03-24 MED ORDER — TERCONAZOLE 0.8 % VA CREA: 1.0000 | TOPICAL_CREAM | Freq: Every day | VAGINAL | Status: AC

## 2011-03-24 NOTE — Progress Notes (Signed)
The patient came to see me today for her annual GYN exam. She is having a lot of hot flashes. This has been going on for over 6 months. She has irregular bleeding with a [name]. She is a mammogram last June at work. The report was sent to Dr. Renato Gails but she was told it was normal. She is having no pelvic pain. She still little bit of persistent bleeding since I changed her IUD. Dr. Renato Gails watches her cholesterol. She is still having some vaginal irritation since I treated her with MetroGel and Diflucan.  Physical examination:  Kennon Portela present. HEENT within normal limits. Neck: Thyroid not large. No masses. Supraclavicular nodes: not enlarged. Breasts: Examined in both sitting midline position. No skin changes and no masses. Abdomen: Soft no guarding rebound or masses or hernia. Pelvic: External: Within normal limits. BUS: Within normal limits. Vaginal:within normal limits. Good estrogen effect. No evidence of cystocele rectocele or enterocele. Cervix: clean. IUD string visible. Uterus: Normal size and shape. Adnexa: No masses. Rectovaginal exam: Confirmatory and negative. Wet prep not done because she is bleeding. Extremities: Within normal limits.  Assessment: hot flashes, persistent yeast.  Plan: TSH and FSH drawn. We will treat her for transitional symptoms with estrogen alone pending lab results. No progesterone needed due to her [name]. Terconazole 3 cream.

## 2011-03-25 ENCOUNTER — Other Ambulatory Visit: Payer: Self-pay | Admitting: *Deleted

## 2011-03-25 MED ORDER — ESTRADIOL 1 MG PO TABS: 1.0000 mg | ORAL_TABLET | Freq: Every day | ORAL | Status: DC

## 2011-04-15 ENCOUNTER — Telehealth: Payer: Self-pay | Admitting: *Deleted

## 2011-04-15 NOTE — Telephone Encounter (Signed)
Pt had her Mirena removed and new one inserted beginning of January. Saw you end of January for AEX and told you had irregular bleeding since new one in along with hot flashes. You tested the [facility] showed she was menopausal, you gave her estrogen and told her to call in 6 weeks if sx's werent better. Was this just for hot flashes or for bleeding too? She is still having daily bleeding some days like a period other days spotting. Pls advise She knows you are not in the office today and may be tomorrow before an answer is given.

## 2011-04-16 ENCOUNTER — Ambulatory Visit (INDEPENDENT_AMBULATORY_CARE_PROVIDER_SITE_OTHER): Payer: 59 | Admitting: Obstetrics and Gynecology

## 2011-04-16 DIAGNOSIS — N950 Postmenopausal bleeding: Secondary | ICD-10-CM

## 2011-04-16 NOTE — Progress Notes (Signed)
Patient came back to see me today because of persistent postmenopausal bleeding. Some days very light and some days it is heavy. It has persisted since we changed her IUD in early January. We added estrogen on Jan 22 due to symptoms and an elevated FSH. She has a history of an endometrial polyp which we removed several years ago.  Pelvic exam: External within normal limits. BUS within normal limits. Vaginal exam within normal limits. Cervix is clean without lesions. IUD string present.  Uterus is normal size and shape. Adnexa failed to reveal masses. Rectovaginal examination is confirmatory and without masses. Kennon Portela present for exam.  Assessment: Postmenopausal bleeding  Plan: We discussed possibilities which include return of ovarian activity, a new endometrial polyp, adjustment to a new IUD with added estrogen.for the moment we've elected to observe her symptoms. We will continue the estrogen since she feels much better on it. If the bleeding persists for 8 more weeks we will have her return for ultrasound and recheck her [facility]. It is too annoying to wait that long she'll return for ultrasound and FSH levels sooner.

## 2011-04-16 NOTE — Telephone Encounter (Signed)
Office visit

## 2011-04-16 NOTE — Telephone Encounter (Signed)
Pt informed and transferred to apts

## 2011-05-09 ENCOUNTER — Telehealth: Payer: Self-pay | Admitting: *Deleted

## 2011-05-09 MED ORDER — METRONIDAZOLE 0.75 % VA GEL: 1.0000 | Freq: Every day | VAGINAL | Status: AC

## 2011-05-09 NOTE — Telephone Encounter (Signed)
MetroGel vaginal cream one applicatorful at bedtime in her vagina for 5 days.

## 2011-05-09 NOTE — Telephone Encounter (Signed)
Left message on pt vm that the below sent to pharmacy.

## 2011-05-09 NOTE — Telephone Encounter (Signed)
Pt called c/o vaginal odor, pt seen in office on 2/13 for PMB with mirena. The bleeding has now stopped but she has this bad odor now. I offered OV to pt but she said that you gave her something last time for this problem. No other complaints. Please advise

## 2011-09-16 ENCOUNTER — Ambulatory Visit (INDEPENDENT_AMBULATORY_CARE_PROVIDER_SITE_OTHER): Payer: 59 | Admitting: Gynecology

## 2011-09-16 ENCOUNTER — Encounter: Payer: Self-pay | Admitting: Gynecology

## 2011-09-16 VITALS — BP 124/78

## 2011-09-16 DIAGNOSIS — Z780 Asymptomatic menopausal state: Secondary | ICD-10-CM | POA: Insufficient documentation

## 2011-09-16 DIAGNOSIS — N920 Excessive and frequent menstruation with regular cycle: Secondary | ICD-10-CM

## 2011-09-16 NOTE — Progress Notes (Signed)
Patient is a 46 year old who in January this year had her [name] changed since the old was overdue. She had had some irregular bleeding and had an [facility] that was drawn and was found to be in the menopausal range and Dr. Eda Paschal (her primary physician) start her on Estrace 1 mg daily to help with her menopausal symptoms. She has not had a menstrual cycle now since February and started menstruating a few days ago. She 1 to make sure that the IUD string was in place. She stated now that she no longer has vasomotor symptoms and has been doing well on this regimen.  Exam: Bartholin urethra Skene glands: Within normal limits Vagina: No lesions or discharge Cervix: No lesions or discharge IUD string was seen with utilization of the colposcope and use of endocervical speculum. Uterus, anteverted normal size shape and consistency no palpable masses or tenderness Adnexa: No palpable masses or tenderness Rectal exam: Not examined  Assessment/plan: Menopausal patient with [name] and Estrace 1 mg by mouth daily. Patient with apparent cyclical bleed. Patient was instructed to monitor her cycles that it's okay if she has one cycle every month but if she has more than one a month she will return to the office for further evaluation. She is scheduled for her annual exam in January 2014. She was reassured all questions were answered and we will follow accordingly.

## 2011-09-16 NOTE — Patient Instructions (Addendum)
Intrauterine Device Information An intrauterine device (IUD) is inserted into your uterus and prevents pregnancy. There are 2 types of IUDs available:  Copper IUD. This type of IUD is wrapped in copper wire and is placed inside the uterus. Copper makes the uterus and fallopian tubes produce a fluid that kills sperm. The copper IUD can stay in place for 10 years.   Hormone IUD. This type of IUD contains the hormone progestin (synthetic progesterone). The hormone thickens the cervical mucus and prevents sperm from entering the uterus, and it also thins the uterine lining to prevent implantation of a fertilized egg. The hormone can weaken or kill the sperm that get into the uterus. The hormone IUD can stay in place for 5 years.  Your caregiver will make sure you are a good candidate for a contraceptive IUD. Discuss with your caregiver the possible side effects. ADVANTAGES  It is highly effective, reversible, long-acting, and low maintenance.   There are no estrogen-related side effects.   An IUD can be used when breastfeeding.   It is not associated with weight gain.   It works immediately after insertion.   The copper IUD does not interfere with your female hormones.   The progesterone IUD can make heavy menstrual periods lighter.   The progesterone IUD can be used for 5 years.   The copper IUD can be used for 10 years.  DISADVANTAGES  The progesterone IUD can be associated with irregular bleeding patterns.   The copper IUD can make your menstrual flow heavier and more painful.   You may experience cramping and vaginal bleeding after insertion.  Document Released: 01/22/2004 Document Revised: 02/06/2011 Document Reviewed: 06/22/2010 [facility] Patient Information 2012 [place], [place].  Menopause Menopause is the normal time of life when menstrual periods stop completely. Menopause is complete when you have missed 12 consecutive menstrual periods. It usually occurs between the ages of  86 to 81, with an average age of 46. Very rarely does a woman develop menopause before 44 years old. At menopause, your ovaries stop producing the female hormones, estrogen and progesterone. This can cause undesirable symptoms and also affect your health. Sometimes the symptoms may occur 4 to 5 years before the menopause begins. There is no relationship between menopause and:  Oral contraceptives.   Number of children you had.   Race.   The age your menstrual periods started (menarche).  Heavy smokers and very thin women may develop menopause earlier in life. CAUSES  The ovaries stop producing the female hormones estrogen and progesterone.   Other causes include:   Surgery to remove both ovaries.   The ovaries stop functioning for no known reason.   Tumors of the pituitary gland in the brain.   Medical disease that affects the ovaries and hormone production.   Radiation treatment to the abdomen or pelvis.   Chemotherapy that affects the ovaries.  SYMPTOMS   Hot flashes.   Night sweats.   Decrease in sex drive.   Vaginal dryness and thinning of the vagina causing painful intercourse.   Dryness of the skin and developing wrinkles.   Headaches.   Tiredness.   Irritability.   Memory problems.   Weight gain.   Bladder infections.   Hair growth of the face and chest.   Infertility.  More serious symptoms include:  Loss of bone (osteoporosis) causing breaks (fractures).   Depression.   Hardening and narrowing of the arteries (atherosclerosis) causing heart attacks and strokes.  DIAGNOSIS   When the  menstrual periods have stopped for 12 straight months.   Physical exam.   Hormone studies of the blood.  TREATMENT  There are many treatment choices and nearly as many questions about them. The decisions to treat or not to treat menopausal changes is an individual choice made with your caregiver. Your caregiver can discuss the treatments with you. Together, you  can decide which treatment will work best for you. Your treatment choices may include:   Hormone therapy (estorgen and progesterone).   Non-hormonal medications.   Treating the individual symptoms with medication (for example antidepressants for depression).   Herbal medications that may help specific symptoms.   Counseling by a psychiatrist or psychologist.   Group therapy.   Lifestyle changes including:   Eating healthy.   Regular exercise.   Limiting caffeine and alcohol.   Stress management and meditation.   No treatment.  HOME CARE INSTRUCTIONS   Take the medication your caregiver gives you as directed.   Get plenty of sleep and rest.   Exercise regularly.   Eat a diet that contains calcium (good for the bones) and soy products (acts like estrogen hormone).   Avoid alcoholic beverages.   Do not smoke.   If you have hot flashes, dress in layers.   Take supplements, calcium and vitamin D to strengthen bones.   You can use over-the-counter lubricants or moisturizers for vaginal dryness.   Group therapy is sometimes very helpful.   Acupuncture may be helpful in some cases.  SEEK MEDICAL CARE IF:   You are not sure you are in menopause.   You are having menopausal symptoms and need advice and treatment.   You are still having menstrual periods after age 21.   You have pain with intercourse.   Menopause is complete (no menstrual period for 12 months) and you develop vaginal bleeding.   You need a referral to a specialist (gynecologist, psychiatrist or psychologist) for treatment.  SEEK IMMEDIATE MEDICAL CARE IF:   You have severe depression.   You have excessive vaginal bleeding.   You fell and think you have a broken bone.   You have pain when you urinate.   You develop leg or chest pain.   You have a fast pounding heart beat (palpitations).   You have severe headaches.   You develop vision problems.   You feel a lump in your breast.    You have abdominal pain or severe indigestion.  Document Released: 05/10/2003 Document Revised: 02/06/2011 Document Reviewed: 12/16/2007 [facility] Patient Information 2012 [place], [place].

## 2011-11-22 ENCOUNTER — Encounter (HOSPITAL_COMMUNITY): Payer: Self-pay | Admitting: *Deleted

## 2011-11-22 ENCOUNTER — Emergency Department (HOSPITAL_COMMUNITY)
Admission: EM | Admit: 2011-11-22 | Discharge: 2011-11-22 | Disposition: A | Discharge status: Home / Self Care | Payer: 59 | Attending: Emergency Medicine | Admitting: Emergency Medicine

## 2011-11-22 ENCOUNTER — Emergency Department (HOSPITAL_COMMUNITY): Payer: 59

## 2011-11-22 DIAGNOSIS — E7800 Pure hypercholesterolemia, unspecified: Secondary | ICD-10-CM | POA: Insufficient documentation

## 2011-11-22 DIAGNOSIS — I10 Essential (primary) hypertension: Secondary | ICD-10-CM | POA: Insufficient documentation

## 2011-11-22 DIAGNOSIS — E119 Type 2 diabetes mellitus without complications: Secondary | ICD-10-CM | POA: Insufficient documentation

## 2011-11-22 DIAGNOSIS — Z888 Allergy status to other drugs, medicaments and biological substances status: Secondary | ICD-10-CM | POA: Insufficient documentation

## 2011-11-22 DIAGNOSIS — M169 Osteoarthritis of hip, unspecified: Secondary | ICD-10-CM | POA: Insufficient documentation

## 2011-11-22 DIAGNOSIS — Z882 Allergy status to sulfonamides status: Secondary | ICD-10-CM | POA: Insufficient documentation

## 2011-11-22 DIAGNOSIS — M25559 Pain in unspecified hip: Secondary | ICD-10-CM | POA: Insufficient documentation

## 2011-11-22 DIAGNOSIS — Z8249 Family history of ischemic heart disease and other diseases of the circulatory system: Secondary | ICD-10-CM | POA: Insufficient documentation

## 2011-11-22 DIAGNOSIS — N809 Endometriosis, unspecified: Secondary | ICD-10-CM | POA: Insufficient documentation

## 2011-11-22 DIAGNOSIS — M1610 Unilateral primary osteoarthritis, unspecified hip: Secondary | ICD-10-CM | POA: Insufficient documentation

## 2011-11-22 DIAGNOSIS — M25551 Pain in right hip: Secondary | ICD-10-CM

## 2011-11-22 DIAGNOSIS — Z881 Allergy status to other antibiotic agents status: Secondary | ICD-10-CM | POA: Insufficient documentation

## 2011-11-22 DIAGNOSIS — M160 Bilateral primary osteoarthritis of hip: Secondary | ICD-10-CM

## 2011-11-22 DIAGNOSIS — Z833 Family history of diabetes mellitus: Secondary | ICD-10-CM | POA: Insufficient documentation

## 2011-11-22 MED ORDER — IBUPROFEN 400 MG PO TABS
800.0000 mg | ORAL_TABLET | Freq: Once | ORAL | Status: AC
  Administered 2011-11-22: 800 mg via ORAL
  Filled 2011-11-22 (×2): qty 2

## 2011-11-22 MED ORDER — IBUPROFEN 800 MG PO TABS: 800.0000 mg | ORAL_TABLET | Freq: Three times a day (TID) | ORAL | Status: DC | PRN

## 2011-11-22 MED ORDER — HYDROCODONE-ACETAMINOPHEN 5-325 MG PO TABS
2.0000 | ORAL_TABLET | Freq: Once | ORAL | Status: AC
  Administered 2011-11-22: 2 via ORAL
  Filled 2011-11-22 (×2): qty 2

## 2011-11-22 MED ORDER — HYDROCODONE-ACETAMINOPHEN 5-325 MG PO TABS: ORAL_TABLET | ORAL | Status: DC

## 2011-11-22 NOTE — ED Notes (Signed)
Patient C/O pain in her right lateral hip. Denies injury.  States that the pain has been worsening over the past few days.  States that it is interfering with her sleep.  States that she is unable to support weight on her hip. States that she has to hold on to the furniture to move.  States that the pain shoots down her right leg.

## 2011-11-22 NOTE — ED Provider Notes (Signed)
History  This chart was scribed for Cyndra Numbers, MD by Shari Heritage. The patient was seen in room TR05C/TR05C. Patient's care was started at 1110.     CSN: [id number]  Arrival date & time 11/22/11  4098   First MD Initiated Contact with Patient 11/22/11 1110      Chief Complaint  Patient presents with  . Leg Pain  . Hip Pain    The history is provided by the patient. No language interpreter was used.    [name] is a 46 y.o. female who presents to the Emergency Department complaining of moderate to severe, constant right hip pain that radiates down her right leg. Patient states that the pain has been present for awhile, but has worsened over the past few days. Patient states that she cannot lie down on her right side and she has difficulty moving. Patient denies bladder or bowel incontinence, abdominal pain, tingling, weakness, nausea, vomiting or diarrhea. Patient also has Achilles tendonitis and spurs in her left foot and is wearing a boot. Patient says that she has been taking Ibuprofen 800mg  for her foot problems. Patient has been seeing a podiatrist. She has a medical history of HTN, endometriosis, diabetes and high cholesterol.    Past Medical History  Diagnosis Date  . Hypertension   . Endometriosis   . Endometrial polyp   . Diabetes mellitus   . Elevated cholesterol     Past Surgical History  Procedure Date  . Knee surgery   . Wrist surgery     gang. cyst  . Diagnostic laparoscopy 1993    with laser adhesions  . Rotator cuff repair   . Combined hysteroscopy diagnostic / d&c   . Oophorectomy 2008    left  . Intrauterine device insertion 03/2006    mirena    Family History  Problem Relation Age of Onset  . Hypertension Mother   . Diabetes Mother   . Hypertension Father   . Diabetes Sister   . Hypertension Brother   . Diabetes Maternal Grandfather     History  Substance Use Topics  . Smoking status: Never Smoker   . Smokeless tobacco: Never  Used  . Alcohol Use: Yes     rare    OB History    Grav Para Term Preterm Abortions TAB SAB Ect Mult Living   2 1 1  1     1       Review of Systems  Constitutional: Negative.   HENT: Negative.   Eyes: Negative.   Respiratory: Negative.   Cardiovascular: Negative.   Gastrointestinal: Negative for nausea, vomiting, abdominal pain and diarrhea.  Genitourinary: Negative for difficulty urinating.  Musculoskeletal: Positive for myalgias.  Skin: Negative.   Neurological: Negative for weakness and numbness.  Psychiatric/Behavioral: Negative.     Allergies  Clindamycin/lincomycin; Oxycodone-acetaminophen; and Sulfa antibiotics  Home Medications   Current Outpatient Rx  Name Route Sig Dispense Refill  . LIPITOR PO Oral Take by mouth.      Marland Kitchen CALTRATE PLUS PO Oral Take by mouth 2 (two) times daily.      Marland Kitchen ESTRADIOL 1 MG PO TABS Oral Take 1 tablet (1 mg total) by mouth daily. 30 tablet 11  . LEVONORGESTREL 20 MCG/24HR IU IUD Intrauterine 1 each by Intrauterine route once. Inserted 08/16/07     . MULTIVITAMINS PO CAPS Oral Take 1 capsule by mouth daily.      Marland Kitchen NAPROXEN SODIUM 220 MG PO TABS Oral Take 220 mg by mouth  as needed.      Marland Kitchen POTASSIUM PO Oral Take by mouth.      . ONGLYZA PO Oral Take by mouth.      . TELMISARTAN-HCTZ 80-25 MG PO TABS Oral Take 1 tablet by mouth daily.        BP 139/96  Pulse 80  Temp 98.3 F (36.8 C) (Oral)  Resp 20  Ht 4\' 10"  (1.473 m)  Wt 187 lb (84.823 kg)  BMI 39.08 kg/m2  SpO2 99%  Physical Exam GEN: Well-developed, well-nourished female in no acute distress HEENT: Atraumatic, normocephalic. EYES: PERRLA BL CV: regular rate and rhythm.  PULM: No respiratory distress.  No crackles, wheezes, or rales. GI: soft, non-tender. No guarding, rebound, or tenderness.  Neuro: cranial nerves grossly 2-12 intact, no abnormalities of strength or sensation, A and O x 3 MSK: Patient moves all 4 extremities symmetrically, no deformity, edema, or injury  noted. No tenderness to palpation over lumbar area. Pain on log roll with the right leg in the right hip. Tenderness to palpation in the right hip. No costovertebral angle tenderness. No paraspinal tenderness. No belly pain. No deformity noted at right hip. Skin: No rashes petechiae, purpura, or jaundice Psych: no abnormality of mood   ED Course  Procedures (including critical care time) DIAGNOSTIC STUDIES: Oxygen Saturation is 99% on room air, normal by my interpretation.    COORDINATION OF CARE: 11:42am- Patient informed of current plan for treatment and evaluation and agrees with plan at this time.    Labs Reviewed - No data to display  Dg Hip Complete Right  11/22/2011  *RADIOLOGY REPORT*  Clinical Data: Hip pain.  RIGHT HIP - COMPLETE 2+ VIEW  Comparison: No priors.  Findings: Single AP view of the pelvis and AP and lateral views of the right hip demonstrate no acute fracture, subluxation or dislocation.  There are degenerative changes of mild - moderate osteoarthritis of the hip joints bilaterally (left greater than right).  Multiple pelvic phleboliths are noted.  An IUD is seen projecting over the mid pelvis.  IMPRESSION: 1.  No acute radiographic abnormality of the bony pelvis or the right hip. 2.  Mild - moderate bilateral hip joint osteoarthritis (left greater than right).   Original Report Authenticated By: Florencia Reasons, M.D.      1. Right hip pain   2. Osteoarthritis of both hips       MDM  Patient was evaluated by myself. Based on history I felt the patient likely had right hip pain that was acute worsening of her chronic pain due to recent placement of a left boot for injury to her left foot. I felt the patient was likely favoring the right leg as a result which exacerbated the pain she had been having. Plain film the hips show degenerative changes bilaterally. Patient denies any acute fracture. Patient was told that she would need to followup with an orthopedist if  her symptoms continued. Patient has seen Dr. Thurston Hole in the past and his phone number was provided again. Patient was discharged with prescriptions for medications to control her symptoms. She is welcome to return if she has any other emergent concerns. I did not feel the patient had septic joint or any other concerning emergent etiology for her symptoms today.      I personally performed the services described in this documentation, which was scribed in my presence. The recorded information has been reviewed and considered.      Cyndra Numbers, MD 11/23/11  1111 

## 2011-11-22 NOTE — ED Notes (Signed)
Patient states she cannot put any pressure on her right side from her hip to her foot.  Patient states she has pain.  She states she cannot lay on the right side and she has diff moving.  Patient has had pain for a while but has tried to deal with it.

## 2012-02-23 ENCOUNTER — Other Ambulatory Visit: Payer: Self-pay | Admitting: Family Medicine

## 2012-02-23 DIAGNOSIS — M545 Low back pain: Secondary | ICD-10-CM

## 2012-02-27 ENCOUNTER — Ambulatory Visit
Admission: RE | Admit: 2012-02-27 | Discharge: 2012-02-27 | Disposition: A | Discharge status: Home / Self Care | Payer: 59 | Source: Ambulatory Visit | Attending: Family Medicine | Admitting: Family Medicine

## 2012-02-27 DIAGNOSIS — M545 Low back pain: Secondary | ICD-10-CM

## 2012-03-26 ENCOUNTER — Encounter: Payer: 59 | Admitting: Gynecology

## 2012-03-29 ENCOUNTER — Ambulatory Visit (INDEPENDENT_AMBULATORY_CARE_PROVIDER_SITE_OTHER): Payer: 59 | Admitting: Gynecology

## 2012-03-29 ENCOUNTER — Encounter: Payer: Self-pay | Admitting: Gynecology

## 2012-03-29 VITALS — BP 118/74 | Ht 59.0 in | Wt 190.0 lb

## 2012-03-29 DIAGNOSIS — N760 Acute vaginitis: Secondary | ICD-10-CM

## 2012-03-29 DIAGNOSIS — B9689 Other specified bacterial agents as the cause of diseases classified elsewhere: Secondary | ICD-10-CM

## 2012-03-29 DIAGNOSIS — Z01419 Encounter for gynecological examination (general) (routine) without abnormal findings: Secondary | ICD-10-CM

## 2012-03-29 DIAGNOSIS — A499 Bacterial infection, unspecified: Secondary | ICD-10-CM

## 2012-03-29 DIAGNOSIS — Z113 Encounter for screening for infections with a predominantly sexual mode of transmission: Secondary | ICD-10-CM

## 2012-03-29 DIAGNOSIS — N898 Other specified noninflammatory disorders of vagina: Secondary | ICD-10-CM

## 2012-03-29 LAB — WET PREP FOR TRICH, YEAST, CLUE
Trich, Wet Prep: NONE SEEN
Yeast Wet Prep HPF POC: NONE SEEN

## 2012-03-29 MED ORDER — METRONIDAZOLE 500 MG PO TABS: ORAL_TABLET | ORAL | Status: DC

## 2012-03-29 MED ORDER — ESTRADIOL 1 MG PO TABS: 1.0000 mg | ORAL_TABLET | Freq: Every day | ORAL | Status: DC

## 2012-03-29 NOTE — Progress Notes (Signed)
[name] [birth year] [id number]   History:    47 y.o.  for annual gyn exam who in January of this year had her [name] changed since the old was overdue. She had had some irregular bleeding and had an [facility] that was drawn and was found to be in the menopausal range and Dr. Eda Paschal (her primary physician) started  her on Estrace 1 mg daily to help with her menopausal symptoms. She states that even before she went on hormone replacement therapy that he she has complained of night sweats for years. She was complaining today of vaginal discharge with odor for the past week and a half and has recently had a new partner and wanted to have an STD screen. Her last mammogram was in 2013 she is due for one later this year. Patient states that she does her monthly self breast examination. Patient with past history of endometrial polyp removed several years ago. Patient's flu vaccine as well as Tdap are up-to-date. She states that she is taking her calcium and vitamin D regularly. Her last Pap smear was normal in 2013 and she denies any past history of abnormal Pap smears. Her primary physician is Dr. Elias Else who has been treating her for hypercholesterolemia and type 2 diabetes and has been drawn her labs.  Past medical history,surgical history, family history and social history were all reviewed and documented in the EPIC chart.  Gynecologic History No LMP recorded. Patient is not currently having periods (Reason: IUD)., and is menopausal and taking Estrace 1 mg daily Contraception: See above Last Pap: 2013. Results were: normal Last mammogram: 2013. Results were: normal  Obstetric History OB History    Grav Para Term Preterm Abortions TAB SAB Ect Mult Living   2 1 1  1     1      # Outc Date GA Lbr Len/2nd Wgt Sex Del Anes PTL Lv   1 TRM            2 ABT                ROS: A ROS was performed and pertinent positives and negatives are included in the history.  GENERAL: No fevers or  chills. HEENT: No change in vision, no earache, sore throat or sinus congestion. NECK: No pain or stiffness. CARDIOVASCULAR: No chest pain or pressure. No palpitations. PULMONARY: No shortness of breath, cough or wheeze. GASTROINTESTINAL: No abdominal pain, nausea, vomiting or diarrhea, melena or bright red blood per rectum. GENITOURINARY: No urinary frequency, urgency, hesitancy or dysuria. MUSCULOSKELETAL: No joint or muscle pain, no back pain, no recent trauma. DERMATOLOGIC: No rash, no itching, no lesions. ENDOCRINE: No polyuria, polydipsia, no heat or cold intolerance. No recent change in weight. HEMATOLOGICAL: No anemia or easy bruising or bleeding. NEUROLOGIC: No headache, seizures, numbness, tingling or weakness. PSYCHIATRIC: No depression, no loss of interest in normal activity or change in sleep pattern.     Exam: chaperone present  BP 118/74  Ht 4\' 11"  (1.499 m)  Wt 190 lb (86.183 kg)  BMI 38.38 kg/m2  Body mass index is 38.38 kg/(m^2).  General appearance : Well developed well nourished female. No acute distress HEENT: Neck supple, trachea midline, no carotid bruits, no thyroidmegaly Lungs: Clear to auscultation, no rhonchi or wheezes, or rib retractions  Heart: Regular rate and rhythm, no murmurs or gallops Breast:Examined in sitting and supine position were symmetrical in appearance, no palpable masses or tenderness,  no skin retraction, no nipple inversion,  no nipple discharge, no skin discoloration, no axillary or supraclavicular lymphadenopathy Abdomen: no palpable masses or tenderness, no rebound or guarding Extremities: no edema or skin discoloration or tenderness  Pelvic:  Bartholin, Urethra, Skene Glands: Within normal limits             Vagina: No gross lesions or discharge  Cervix: No gross lesions or discharge, IUD string not seen  Uterus  anteverted, normal size, shape and consistency, non-tender and mobile  Adnexa  Without masses or tenderness  Anus and perineum   normal   Rectovaginal  normal sphincter tone without palpated masses or tenderness             Hemoccult not indicated   Wet prep: Clue cells were evident as well as to numerous to count bacteria   Assessment/Plan:  47 y.o. female for annual exam with clinical evidence of bacterial vaginosis. Patient with early menopause has recently been placed on estradiol 1 mg daily. Last year she had a [name] placed. Today's wet prep demonstrated evidence of bacterial vaginosis for which she'll be placed on Flagyl 500 mg one by mouth twice a day for 5 days. GC and Chlamydia culture result pending at time of this dictation. Patient will return back to the office next week for an ultrasound to confirm the position of the IUD since the string was not seen on today's exam. Patient was reminded of the importance of calcium vitamin D for osteoporosis prevention as well as regular exercise. We'll be getting a baseline bone density study in the next couple of years. She was reminded do her monthly self breast examination and to schedule her mammogram for later this year.    Ok Edwards MD, 8:54 PM 03/29/2012

## 2012-03-29 NOTE — Patient Instructions (Addendum)
Bacterial Vaginosis Bacterial vaginosis (BV) is a vaginal infection where the normal balance of bacteria in the vagina is disrupted. The normal balance is then replaced by an overgrowth of certain bacteria. There are several different kinds of bacteria that can cause BV. BV is the most common vaginal infection in women of childbearing age. CAUSES   The cause of BV is not fully understood. BV develops when there is an increase or imbalance of harmful bacteria.  Some activities or behaviors can upset the normal balance of bacteria in the vagina and put women at increased risk including:  Having a new sex partner or multiple sex partners.  Douching.  Using an intrauterine device (IUD) for contraception.  It is not clear what role sexual activity plays in the development of BV. However, women that have never had sexual intercourse are rarely infected with BV. Women do not get BV from toilet seats, bedding, swimming pools or from touching objects around them.  SYMPTOMS   Grey vaginal discharge.  A fish-like odor with discharge, especially after sexual intercourse.  Itching or burning of the vagina and vulva.  Burning or pain with urination.  Some women have no signs or symptoms at all. DIAGNOSIS  Your caregiver must examine the vagina for signs of BV. Your caregiver will perform lab tests and look at the sample of vaginal fluid through a microscope. They will look for bacteria and abnormal cells (clue cells), a pH test higher than 4.5, and a positive amine test all associated with BV.  RISKS AND COMPLICATIONS   Pelvic inflammatory disease (PID).  Infections following gynecology surgery.  Developing HIV.  Developing herpes virus. TREATMENT  Sometimes BV will clear up without treatment. However, all women with symptoms of BV should be treated to avoid complications, especially if gynecology surgery is planned. Female partners generally do not need to be treated. However, BV may spread  between female sex partners so treatment is helpful in preventing a recurrence of BV.   BV may be treated with antibiotics. The antibiotics come in either pill or vaginal cream forms. Either can be used with nonpregnant or pregnant women, but the recommended dosages differ. These antibiotics are not harmful to the baby.  BV can recur after treatment. If this happens, a second round of antibiotics will often be prescribed.  Treatment is important for pregnant women. If not treated, BV can cause a premature delivery, especially for a pregnant woman who had a premature birth in the past. All pregnant women who have symptoms of BV should be checked and treated.  For chronic reoccurrence of BV, treatment with a type of prescribed gel vaginally twice a week is helpful. HOME CARE INSTRUCTIONS   Finish all medication as directed by your caregiver.  Do not have sex until treatment is completed.  Tell your sexual partner that you have a vaginal infection. They should see their caregiver and be treated if they have problems, such as a mild rash or itching.  Practice safe sex. Use condoms. Only have 1 sex partner. PREVENTION  Basic prevention steps can help reduce the risk of upsetting the natural balance of bacteria in the vagina and developing BV:  Do not have sexual intercourse (be abstinent).  Do not douche.  Use all of the medicine prescribed for treatment of BV, even if the signs and symptoms go away.  Tell your sex partner if you have BV. That way, they can be treated, if needed, to prevent reoccurrence. SEEK MEDICAL CARE IF:     Your symptoms are not improving after 3 days of treatment.  You have increased discharge, pain, or fever. MAKE SURE YOU:   Understand these instructions.  Will watch your condition.  Will get help right away if you are not doing well or get worse. FOR MORE INFORMATION  Division of STD Prevention (DSTDP), Centers for Disease Control and Prevention:  www.cdc.gov/std American Social Health Association (ASHA): www.ashastd.org  Document Released: 02/17/2005 Document Revised: 05/12/2011 Document Reviewed: 08/10/2008 [employer] Patient Information 2013 [employer].  

## 2012-03-30 ENCOUNTER — Encounter: Payer: Self-pay | Admitting: Gynecology

## 2012-03-30 LAB — GC/CHLAMYDIA PROBE AMP: CT Probe RNA: NEGATIVE

## 2012-04-05 ENCOUNTER — Other Ambulatory Visit: Payer: 59

## 2012-04-05 ENCOUNTER — Ambulatory Visit: Payer: 59 | Admitting: Gynecology

## 2012-04-14 ENCOUNTER — Ambulatory Visit: Payer: 59 | Admitting: Gynecology

## 2012-04-14 ENCOUNTER — Other Ambulatory Visit: Payer: 59

## 2012-04-28 ENCOUNTER — Ambulatory Visit (INDEPENDENT_AMBULATORY_CARE_PROVIDER_SITE_OTHER): Payer: 59

## 2012-04-28 ENCOUNTER — Encounter: Payer: Self-pay | Admitting: Gynecology

## 2012-04-28 ENCOUNTER — Ambulatory Visit (INDEPENDENT_AMBULATORY_CARE_PROVIDER_SITE_OTHER): Payer: 59 | Admitting: Gynecology

## 2012-04-28 MED ORDER — METRONIDAZOLE 0.75 % VA GEL: 1.0000 | Freq: Two times a day (BID) | VAGINAL | Status: DC

## 2012-04-28 NOTE — Addendum Note (Signed)
Addended by: Ok Edwards on: 04/28/2012 03:24 PM   Modules accepted: Orders

## 2012-04-28 NOTE — Progress Notes (Signed)
Patient is a 47 year old who was recently seen the office for her annual exam. In January 2014 and had a [name] changed. Patient last year had an FSH that was found to be in the menopausal range. Her primary physician Dr. Eda Paschal had started her on Estrace 1 mg daily to help with her menopausal symptoms. She states that even before she went on hormone replacement therapy that he she has complained of night sweats for years.at time of her annual exam her IUD string was not seen so she was asked to come in today for an ultrasound.  Ultrasound report: Uterus measured 8.2 x 5.1 x 4.0 cm with endometrial stripe of 1.2 mm. Right and left ovary were normal. IUD was seen in the endometrial cavity normal position.  Assessment/plan: Patient menopausal on Estrace 1 mg daily and has a [name] as a progestational agent. Instead of increasing her estrogen to help with her evening night sweats we discussed about placing her on Brisdelle 7.5 mg daily. The risks benefits and pros and cons were discussed. Patient would like to go this route instead of putting her at risk for breast cancer by increasing her estrogen. Literature information was provided. Patient will return back in one year for her annual exam or when necessary.

## 2012-08-26 ENCOUNTER — Encounter: Payer: Self-pay | Admitting: Gynecology

## 2012-09-13 ENCOUNTER — Telehealth: Payer: Self-pay | Admitting: *Deleted

## 2012-09-13 NOTE — Telephone Encounter (Signed)
Pt informed with the below note. 

## 2012-09-13 NOTE — Telephone Encounter (Signed)
Pt said she received a recall letter in mail for return 6 months from Mar 29, 2012 to have IUD changed? Pt just had IUD placed with Dr.G on OV 03/12/11. Pt is very confused about why she received the letter, it has not been 5 years. Please advise

## 2012-09-13 NOTE — Telephone Encounter (Signed)
Please offer patient an apology for g received. I reviewed her records and her IUD is good for 5 years. We will see her at her annual gynecological exam

## 2012-11-18 ENCOUNTER — Emergency Department (HOSPITAL_COMMUNITY): Payer: 59

## 2012-11-18 ENCOUNTER — Observation Stay (HOSPITAL_COMMUNITY)
Admission: EM | Admit: 2012-11-18 | Discharge: 2012-11-19 | Disposition: A | Discharge status: Home / Self Care | Payer: 59 | Attending: Internal Medicine | Admitting: Internal Medicine

## 2012-11-18 ENCOUNTER — Encounter (HOSPITAL_COMMUNITY): Payer: Self-pay | Admitting: Emergency Medicine

## 2012-11-18 DIAGNOSIS — Y9210 Unspecified residential institution as the place of occurrence of the external cause: Secondary | ICD-10-CM | POA: Insufficient documentation

## 2012-11-18 DIAGNOSIS — E1165 Type 2 diabetes mellitus with hyperglycemia: Secondary | ICD-10-CM

## 2012-11-18 DIAGNOSIS — E119 Type 2 diabetes mellitus without complications: Secondary | ICD-10-CM | POA: Diagnosis present

## 2012-11-18 DIAGNOSIS — E7800 Pure hypercholesterolemia, unspecified: Secondary | ICD-10-CM | POA: Insufficient documentation

## 2012-11-18 DIAGNOSIS — E876 Hypokalemia: Secondary | ICD-10-CM | POA: Diagnosis present

## 2012-11-18 DIAGNOSIS — R0789 Other chest pain: Principal | ICD-10-CM | POA: Diagnosis present

## 2012-11-18 DIAGNOSIS — T148XXA Other injury of unspecified body region, initial encounter: Secondary | ICD-10-CM

## 2012-11-18 DIAGNOSIS — R079 Chest pain, unspecified: Secondary | ICD-10-CM

## 2012-11-18 DIAGNOSIS — I10 Essential (primary) hypertension: Secondary | ICD-10-CM | POA: Diagnosis present

## 2012-11-18 DIAGNOSIS — T80818A Extravasation of other vesicant agent, initial encounter: Secondary | ICD-10-CM

## 2012-11-18 DIAGNOSIS — Z23 Encounter for immunization: Secondary | ICD-10-CM | POA: Insufficient documentation

## 2012-11-18 DIAGNOSIS — Z780 Asymptomatic menopausal state: Secondary | ICD-10-CM

## 2012-11-18 DIAGNOSIS — T888XXA Other specified complications of surgical and medical care, not elsewhere classified, initial encounter: Secondary | ICD-10-CM | POA: Insufficient documentation

## 2012-11-18 DIAGNOSIS — Y849 Medical procedure, unspecified as the cause of abnormal reaction of the patient, or of later complication, without mention of misadventure at the time of the procedure: Secondary | ICD-10-CM | POA: Insufficient documentation

## 2012-11-18 DIAGNOSIS — E785 Hyperlipidemia, unspecified: Secondary | ICD-10-CM | POA: Diagnosis present

## 2012-11-18 DIAGNOSIS — Z79899 Other long term (current) drug therapy: Secondary | ICD-10-CM | POA: Insufficient documentation

## 2012-11-18 LAB — SEDIMENTATION RATE: Sed Rate: 33 mm/hr — ABNORMAL HIGH (ref 0–22)

## 2012-11-18 LAB — POCT I-STAT TROPONIN I

## 2012-11-18 LAB — CBC
MCH: 28.1 pg (ref 26.0–34.0)
MCHC: 34.7 g/dL (ref 30.0–36.0)
MCV: 80.9 fL (ref 78.0–100.0)
Platelets: 291 10*3/uL (ref 150–400)
RBC: 5.02 MIL/uL (ref 3.87–5.11)
RDW: 13.1 % (ref 11.5–15.5)

## 2012-11-18 LAB — HEPATIC FUNCTION PANEL
Albumin: 4.2 g/dL (ref 3.5–5.2)
Total Protein: 8.3 g/dL (ref 6.0–8.3)

## 2012-11-18 LAB — BASIC METABOLIC PANEL
Calcium: 9.7 mg/dL (ref 8.4–10.5)
Creatinine, Ser: 0.69 mg/dL (ref 0.50–1.10)
GFR calc non Af Amer: >90 mL/min (ref >90)
Sodium: 138 mEq/L (ref 135–145)

## 2012-11-18 LAB — GLUCOSE, CAPILLARY: Glucose-Capillary: 94 mg/dL (ref 70–99)

## 2012-11-18 LAB — LIPASE, BLOOD: Lipase: 42 U/L (ref 11–59)

## 2012-11-18 MED ORDER — POTASSIUM CHLORIDE CRYS ER 20 MEQ PO TBCR: 40.0000 meq | EXTENDED_RELEASE_TABLET | Freq: Two times a day (BID) | ORAL | Status: DC

## 2012-11-18 MED ORDER — ONDANSETRON HCL 4 MG PO TABS: 4.0000 mg | ORAL_TABLET | Freq: Four times a day (QID) | ORAL | Status: DC | PRN

## 2012-11-18 MED ORDER — HYDROMORPHONE HCL PF 1 MG/ML IJ SOLN
1.0000 mg | INTRAMUSCULAR | Status: DC | PRN
  Administered 2012-11-19: 1 mg via INTRAVENOUS
  Filled 2012-11-18: qty 1

## 2012-11-18 MED ORDER — HYDROCHLOROTHIAZIDE 12.5 MG PO CAPS
12.5000 mg | ORAL_CAPSULE | Freq: Every day | ORAL | Status: DC
Start: 2012-11-19 — End: 2012-11-19
  Administered 2012-11-19: 12.5 mg via ORAL
  Filled 2012-11-18: qty 1

## 2012-11-18 MED ORDER — ATORVASTATIN CALCIUM 20 MG PO TABS
20.0000 mg | ORAL_TABLET | Freq: Every day | ORAL | Status: DC
  Administered 2012-11-19: 20 mg via ORAL
  Filled 2012-11-18: qty 1

## 2012-11-18 MED ORDER — POTASSIUM CHLORIDE CRYS ER 20 MEQ PO TBCR
40.0000 meq | EXTENDED_RELEASE_TABLET | Freq: Once | ORAL | Status: AC
  Administered 2012-11-18: 40 meq via ORAL
  Filled 2012-11-18: qty 2

## 2012-11-18 MED ORDER — ASPIRIN 81 MG PO CHEW: 324.0000 mg | CHEWABLE_TABLET | Freq: Once | ORAL | Status: DC

## 2012-11-18 MED ORDER — ACETAMINOPHEN 650 MG RE SUPP: 650.0000 mg | Freq: Four times a day (QID) | RECTAL | Status: DC | PRN

## 2012-11-18 MED ORDER — MORPHINE SULFATE 4 MG/ML IJ SOLN
4.0000 mg | Freq: Once | INTRAMUSCULAR | Status: AC
  Administered 2012-11-18: 4 mg via INTRAVENOUS
  Filled 2012-11-18: qty 1

## 2012-11-18 MED ORDER — ESTRADIOL 1 MG PO TABS
1.0000 mg | ORAL_TABLET | Freq: Every day | ORAL | Status: DC
  Administered 2012-11-19: 1 mg via ORAL
  Filled 2012-11-18: qty 1

## 2012-11-18 MED ORDER — ACETAMINOPHEN 325 MG PO TABS: 650.0000 mg | ORAL_TABLET | Freq: Four times a day (QID) | ORAL | Status: DC | PRN

## 2012-11-18 MED ORDER — HYDROMORPHONE HCL PF 1 MG/ML IJ SOLN
1.0000 mg | Freq: Once | INTRAMUSCULAR | Status: DC
  Filled 2012-11-18: qty 1

## 2012-11-18 MED ORDER — KETOROLAC TROMETHAMINE 30 MG/ML IJ SOLN
30.0000 mg | Freq: Once | INTRAMUSCULAR | Status: AC
  Administered 2012-11-18: 30 mg via INTRAVENOUS
  Filled 2012-11-18: qty 1

## 2012-11-18 MED ORDER — OLMESARTAN MEDOXOMIL-HCTZ 40-12.5 MG PO TABS: 1.0000 | ORAL_TABLET | Freq: Every day | ORAL | Status: DC

## 2012-11-18 MED ORDER — SODIUM CHLORIDE 0.9 % IJ SOLN
3.0000 mL | Freq: Two times a day (BID) | INTRAMUSCULAR | Status: DC
  Administered 2012-11-18 – 2012-11-19 (×2): 3 mL via INTRAVENOUS

## 2012-11-18 MED ORDER — IRBESARTAN 300 MG PO TABS
300.0000 mg | ORAL_TABLET | Freq: Every day | ORAL | Status: DC
  Administered 2012-11-19: 300 mg via ORAL
  Filled 2012-11-18: qty 1

## 2012-11-18 MED ORDER — IOHEXOL 350 MG/ML SOLN
100.0000 mL | Freq: Once | INTRAVENOUS | Status: AC | PRN
  Administered 2012-11-18: 100 mL via INTRAVENOUS

## 2012-11-18 MED ORDER — DIAZEPAM 5 MG PO TABS
5.0000 mg | ORAL_TABLET | Freq: Once | ORAL | Status: AC
  Administered 2012-11-18: 5 mg via ORAL
  Filled 2012-11-18: qty 1

## 2012-11-18 MED ORDER — MELOXICAM 7.5 MG PO TABS
7.5000 mg | ORAL_TABLET | Freq: Every day | ORAL | Status: DC
  Filled 2012-11-18: qty 1

## 2012-11-18 MED ORDER — HYDROMORPHONE HCL PF 1 MG/ML IJ SOLN
0.5000 mg | Freq: Once | INTRAMUSCULAR | Status: DC
  Filled 2012-11-18: qty 1

## 2012-11-18 MED ORDER — ASPIRIN EC 81 MG PO TBEC
81.0000 mg | DELAYED_RELEASE_TABLET | Freq: Every day | ORAL | Status: DC
  Administered 2012-11-19: 81 mg via ORAL
  Filled 2012-11-18 (×2): qty 1

## 2012-11-18 MED ORDER — LINAGLIPTIN 5 MG PO TABS
5.0000 mg | ORAL_TABLET | Freq: Every day | ORAL | Status: DC
  Administered 2012-11-19: 5 mg via ORAL
  Filled 2012-11-18: qty 1

## 2012-11-18 MED ORDER — OXYCODONE HCL 5 MG PO TABS: 5.0000 mg | ORAL_TABLET | Freq: Once | ORAL | Status: DC

## 2012-11-18 MED ORDER — ONDANSETRON HCL 4 MG/2ML IJ SOLN
4.0000 mg | Freq: Four times a day (QID) | INTRAMUSCULAR | Status: DC | PRN
  Administered 2012-11-19: 4 mg via INTRAVENOUS
  Filled 2012-11-18: qty 2

## 2012-11-18 MED ORDER — NITROGLYCERIN 0.4 MG SL SUBL
0.4000 mg | SUBLINGUAL_TABLET | SUBLINGUAL | Status: DC | PRN
  Administered 2012-11-18 (×2): 0.4 mg via SUBLINGUAL
  Filled 2012-11-18: qty 25

## 2012-11-18 NOTE — Consult Note (Addendum)
[name] is an 47 y.o. female.   Chief Complaint: right forearm pain HPI: 46 yo female states she came to ED for chest pain and shortness of breath.  While undergoing CT angiogram, contrast extravasated in proximal lateral forearm.  Per radiology note, extravasation of 30-40 ml of omnipaque contrast at ~ 1645.  I was consulted by ED for evaluation for possible compartment syndrome ~1900.  Patient seen at 64.  Patient rates her pain at 6/10 in proximal right forearm.  Feels it has swollen more since extravasation event, but not more painful.  Previous right shoulder arthroscopy and right wrist volar ganglion excision.    Past Medical History  Diagnosis Date  . Hypertension   . Endometriosis   . Endometrial polyp   . Diabetes mellitus   . Elevated cholesterol   . Arthritis     Past Surgical History  Procedure Laterality Date  . Knee surgery    . Wrist surgery      gang. cyst  . Diagnostic laparoscopy  1993    with laser adhesions  . Rotator cuff repair    . Combined hysteroscopy diagnostic / d&c    . Oophorectomy  2008    left  . Intrauterine device insertion  03/2006    mirena    Family History  Problem Relation Age of Onset  . Hypertension Mother   . Diabetes Mother   . Hypertension Father   . Diabetes Sister   . Hypertension Brother   . Diabetes Maternal Grandfather    Social History:  reports that she has never smoked. She has never used smokeless tobacco. She reports that  drinks alcohol. She reports that she does not use illicit drugs.  Allergies:  Allergies  Allergen Reactions  . Sulfa Antibiotics Hives  . Clindamycin/Lincomycin Rash     (Not in a hospital admission)  Results for orders placed during the hospital encounter of 11/18/12 (from the past 48 hour(s))  CBC     Status: None   Collection Time    11/18/12 12:57 PM      Result Value Range   WBC 10.0  4.0 - 10.5 K/uL   RBC 5.02  3.87 - 5.11 MIL/uL   Hemoglobin 14.1  12.0 - 15.0 g/dL   HCT  04.5  40.9 - 81.1 %   MCV 80.9  78.0 - 100.0 fL   MCH 28.1  26.0 - 34.0 pg   MCHC 34.7  30.0 - 36.0 g/dL   RDW 91.4  78.2 - 95.6 %   Platelets 291  150 - 400 K/uL  BASIC METABOLIC PANEL     Status: Abnormal   Collection Time    11/18/12 12:57 PM      Result Value Range   Sodium 138  135 - 145 mEq/L   Potassium 3.2 (*) 3.5 - 5.1 mEq/L   Chloride 96  96 - 112 mEq/L   CO2 29  19 - 32 mEq/L   Glucose, Bld 102 (*) 70 - 99 mg/dL   BUN 10  6 - 23 mg/dL   Creatinine, Ser 2.13  0.50 - 1.10 mg/dL   Calcium 9.7  8.4 - 08.6 mg/dL   GFR calc non Af Amer >90  >90 mL/min   GFR calc Af Amer >90  >90 mL/min   Comment: (NOTE)     The eGFR has been calculated using the CKD EPI equation.     This calculation has not been validated in all clinical situations.  eGFR's persistently <90 mL/min signify possible Chronic Kidney     Disease.  HEPATIC FUNCTION PANEL     Status: None   Collection Time    11/18/12 12:57 PM      Result Value Range   Total Protein 8.3  6.0 - 8.3 g/dL   Albumin 4.2  3.5 - 5.2 g/dL   AST 15  0 - 37 U/L   ALT 9  0 - 35 U/L   Alkaline Phosphatase 80  39 - 117 U/L   Total Bilirubin 0.4  0.3 - 1.2 mg/dL   Bilirubin, Direct PENDING  0.0 - 0.3 mg/dL   Indirect Bilirubin PENDING  0.3 - 0.9 mg/dL  LIPASE, BLOOD     Status: None   Collection Time    11/18/12 12:57 PM      Result Value Range   Lipase 42  11 - 59 U/L  POCT I-STAT TROPONIN I     Status: None   Collection Time    11/18/12  1:04 PM      Result Value Range   Troponin i, poc 0.00  0.00 - 0.08 ng/mL   Comment 3            Comment: Due to the release kinetics of cTnI,     a negative result within the first hours     of the onset of symptoms does not rule out     myocardial infarction with certainty.     If myocardial infarction is still suspected,     repeat the test at appropriate intervals.  TROPONIN I     Status: None   Collection Time    11/18/12  7:20 PM      Result Value Range   Troponin I <0.30  <0.30  ng/mL   Comment:            Due to the release kinetics of cTnI,     a negative result within the first hours     of the onset of symptoms does not rule out     myocardial infarction with certainty.     If myocardial infarction is still suspected,     repeat the test at appropriate intervals.    Dg Chest 2 View  11/18/2012   CLINICAL DATA:  Chest pain  EXAM: CHEST  2 VIEW  COMPARISON:  None.  FINDINGS: The lungs are clear. Heart size and pulmonary vascularity are normal. No adenopathy. No bone lesions. There is mild upper thoracic levoscoliosis. No pneumothorax.  IMPRESSION: No edema or consolidation.   Electronically Signed   By: Bretta Bang   On: 11/18/2012 14:24   Ct Angio Chest Pe W/cm &/or Wo Cm  11/18/2012   CLINICAL DATA:  Left chest pain, shortness of breast  EXAM: CT ANGIOGRAPHY CHEST WITH CONTRAST  TECHNIQUE: Multidetector CT imaging of the chest was performed using the standard protocol during bolus administration of intravenous contrast. Multiplanar CT image reconstructions including MIPs were obtained to evaluate the vascular anatomy.  CONTRAST:  OMNIPAQUE IOHEXOL 350 MG/ML SOLN  Note: During contrast administration, there was extravasation of contrast at the IV site (right antecubital fossa). The total extravasation volume likely did not exceed 30-40 mL based on the diagnostic quality of the study. However, the patient exhibited significant discomfort and swelling. The patient was evaluated by Dr Derinda Late, an ice pack was administered, and the arm was elevated. [name] was notified in the ED on 11/18/2012 at 1645 hrs and the patient  returned to the ED for further evaluation/care.  COMPARISON:  Chest radiographs dated 11/18/2012.  FINDINGS: No evidence of pulmonary embolism.  Lungs are clear. No suspicious pulmonary nodules. No pleural effusion or pneumothorax.  Visualized thyroid is a mildly prominent in size.  The heart is mildly enlarged. No pericardial effusion.  No  suspicious mediastinal, hilar, or axillary lymphadenopathy.  Visualized upper abdomen is unremarkable.  Mild degenerative changes of the visualized thoracolumbar spine.  Review of the MIP images confirms the above findings.  IMPRESSION: No evidence of pulmonary embolism.  No evidence of acute cardiopulmonary disease.  Extravasation of contrast at the IV site (right antecubital fossa), as described above. [name] was notified in the ED on 11/18/2012 at 1645 hrs and the patient returned to the ED for further evaluation/care.   Electronically Signed   By: Charline Bills M.D.   On: 11/18/2012 17:02     A comprehensive review of systems was negative except for: Cardiovascular: positive for chest pain and shortness of breath  Blood pressure 125/83, pulse 85, temperature 97.9 F (36.6 C), temperature source Oral, resp. rate 20, SpO2 94.00%.  General appearance: alert, cooperative and appears stated age Head: Normocephalic, without obvious abnormality, atraumatic Neck: supple, symmetrical, trachea midline Extremities: intact sensation and capillary refill all digits bilaterally.  normal sensation.  hand warm. +epl/fpl/io.  full range of motion of all digits and wrist without pain.  albe to flex and extend elbow without pain.  compartments soft.  swelling laterally at elbow just proximal to iv site.  tender to palpation in this area (~5x3 cm). no other tenderness to palpation.  pulses equal but weak bilaterally at wrists.  no tenderness, wounds, or swelling of left arm.  Pulses: equal but weak at wrists bilaterally Skin: Skin color, texture, turgor normal. No rashes or lesions Neurologic: Grossly normal Incision/Wound: na  Assessment/Plan Right forearm contrast extravasation.  Does not appear to be compartment syndrome at this time.  Extravasation seems limited to subcutaneous tissues lateral to elbow.  Will follow closely.  Nyx Keady R 11/18/2012, 8:12 PM

## 2012-11-18 NOTE — ED Provider Notes (Signed)
Medical screening examination/treatment/procedure(s) were conducted as a shared visit with non-physician practitioner(s) and myself.  I personally evaluated the patient during the encounter.  Chest pain of uncertain etiology. Admit. Extravasation of contrast into right distal humerus/proximal forearm was examined. Hand surgery consult obtained to rule out compartment syndrome  Donnetta Hutching, MD 11/18/12 [phone]

## 2012-11-18 NOTE — ED Notes (Signed)
Per EMS Pt here from [facility] PCP c/o CP x24 hours. Central CP radiates to left arm, pain is constant in nature but intermittently increases to 10/10. Pain increases with movement and breathing. NSR on monitor.

## 2012-11-18 NOTE — H&P (Addendum)
[facility] History and Physical  [name] ZOX:[id number] DOB: [birth year] DOA: 11/18/2012  Referring physician: Adriana Vasquez PCP: Isabel Patella, MD   Chief Complaint: Chest pain  HPI: [name] is a 47 y.o. female who began having essentially constant substernal and left-sided chest pain yesterday. Radiated to intrascapular area and mid back. Shortness of breath. No fevers or chills. Reports having had a negative stress test at [facility] in 2012 though I do not see results in Epic. She has a history of diabetes, hypertension, hyperlipidemia. CT angiogram of the chest showed no pulmonary embolus. Also, patient had extravasation of dye during CAT scan. She's complaining of arm pain swelling and blistering.  Review of Systems: Systems reviewed and as above otherwise negative.  Past Medical History  Diagnosis Date  . Hypertension   . Endometriosis   . Endometrial polyp   . Diabetes mellitus   . Elevated cholesterol   . Arthritis    Past Surgical History  Procedure Laterality Date  . Knee surgery    . Wrist surgery      gang. cyst  . Diagnostic laparoscopy  1993    with laser adhesions  . Rotator cuff repair    . Combined hysteroscopy diagnostic / d&c    . Oophorectomy  2008    left  . Intrauterine device insertion  03/2006    mirena   Social History:  reports that she has never smoked. She has never used smokeless tobacco. She reports that  drinks alcohol. She reports that she does not use illicit drugs.  Allergies  Allergen Reactions  . Sulfa Antibiotics Hives  . Clindamycin/Lincomycin Rash    Family History  Problem Relation Age of Onset  . Hypertension Mother   . Diabetes Mother   . Hypertension Father   . Diabetes Sister   . Hypertension Brother   . Diabetes Maternal Grandfather    Prior to Admission medications   Medication Sig Start Date End Date Taking? Authorizing Provider  atorvastatin (LIPITOR) 20 MG tablet Take 20 mg by  mouth at bedtime.   Yes Historical Provider, MD  estradiol (ESTRACE) 1 MG tablet Take 1 tablet (1 mg total) by mouth daily. 03/29/12  Yes Ok Edwards, MD  furosemide (LASIX) 20 MG tablet Take 20 mg by mouth 2 (two) times daily as needed (fluid).    Yes Historical Provider, MD  ibuprofen (ADVIL,MOTRIN) 400 MG tablet Take 800 mg by mouth 3 (three) times daily.   Yes Historical Provider, MD  ibuprofen (ADVIL,MOTRIN) 800 MG tablet Take 1 tablet (800 mg total) by mouth every 8 (eight) hours as needed for pain. 11/22/11  Yes Meagan Hunt, MD  meloxicam (MOBIC) 7.5 MG tablet Take 7.5 mg by mouth daily.   Yes Historical Provider, MD  olmesartan-hydrochlorothiazide (BENICAR HCT) 40-12.5 MG per tablet Take 1 tablet by mouth daily.   Yes Historical Provider, MD  saxagliptin HCl (ONGLYZA) 2.5 MG TABS tablet Take 5 mg by mouth every morning.   Yes Historical Provider, MD  levonorgestrel (MIRENA) 20 MCG/24HR IUD 1 each by Intrauterine route once.     Historical Provider, MD   Physical Exam: Filed Vitals:   11/18/12 1810  BP: 125/83  Pulse: 85  Temp:   Resp: 20   BP 125/83  Pulse 85  Temp(Src) 97.9 F (36.6 C) (Oral)  Resp 20  SpO2 94%  General Appearance:   anxious appearing. Alert. Uncomfortable  Head:    Normocephalic, without obvious abnormality, atraumatic  Eyes:  PERRL, conjunctiva/corneas clear, EOM's intact, fundi    benign, both eyes     Nose:   Nares normal, septum midline, mucosa normal, no drainage    or sinus tenderness  Throat:   Lips, mucosa, and tongue normal; teeth and gums normal  Neck:   Supple, symmetrical, trachea midline, no adenopathy;    thyroid:  no enlargement/tenderness/nodules; no carotid   bruit or JVD     Lungs:     Clear to auscultation bilaterally, respirations unlabored  Chest Wall:    No tenderness or deformity   Heart:    Regular rate and rhythm, S1 and S2 normal, no murmur, rub   or gallop     Abdomen:     Soft, non-tender, bowel sounds active all  four quadrants,    no masses, no organomegaly  Genitalia:  deferred  Rectal:  deferred  Extremities:   right arm with blistering near the antecubital fossa. Tenderness, induration and fullness noted proximal and distal to the elbow. Patient has this arm elevated with ice.  Pulses:   2+ and symmetric all extremities  Skin:   Skin color, texture, turgor normal, no rashes or lesions  Lymph nodes:   Cervical, supraclavicular, and axillary nodes normal  Neurologic:   CNII-XII intact, normal strength, sensation and reflexes    throughout    Psychiatric: Anxious appearing.  Labs on Admission:  Basic Metabolic Panel:  Recent Labs Lab 11/18/12 1257  NA 138  K 3.2*  CL 96  CO2 29  GLUCOSE 102*  BUN 10  CREATININE 0.69  CALCIUM 9.7   Liver Function Tests: No results found for this basename: AST, ALT, ALKPHOS, BILITOT, PROT, ALBUMIN,  in the last 168 hours No results found for this basename: LIPASE, AMYLASE,  in the last 168 hours No results found for this basename: AMMONIA,  in the last 168 hours CBC:  Recent Labs Lab 11/18/12 1257  WBC 10.0  HGB 14.1  HCT 40.6  MCV 80.9  PLT 291    Radiological Exams on Admission: Dg Chest 2 View  11/18/2012   CLINICAL DATA:  Chest pain  EXAM: CHEST  2 VIEW  COMPARISON:  None.  FINDINGS: The lungs are clear. Heart size and pulmonary vascularity are normal. No adenopathy. No bone lesions. There is mild upper thoracic levoscoliosis. No pneumothorax.  IMPRESSION: No edema or consolidation.   Electronically Signed   By: Bretta Bang   On: 11/18/2012 14:24   Ct Angio Chest Pe W/cm &/or Wo Cm  11/18/2012   CLINICAL DATA:  Left chest pain, shortness of breast  EXAM: CT ANGIOGRAPHY CHEST WITH CONTRAST  TECHNIQUE: Multidetector CT imaging of the chest was performed using the standard protocol during bolus administration of intravenous contrast. Multiplanar CT image reconstructions including MIPs were obtained to evaluate the vascular anatomy.   CONTRAST:  OMNIPAQUE IOHEXOL 350 MG/ML SOLN  Note: During contrast administration, there was extravasation of contrast at the IV site (right antecubital fossa). The total extravasation volume likely did not exceed 30-40 mL based on the diagnostic quality of the study. However, the patient exhibited significant discomfort and swelling. The patient was evaluated by Dr Derinda Late, an ice pack was administered, and the arm was elevated. [name] was notified in the ED on 11/18/2012 at 1645 hrs and the patient returned to the ED for further evaluation/care.  COMPARISON:  Chest radiographs dated 11/18/2012.  FINDINGS: No evidence of pulmonary embolism.  Lungs are clear. No suspicious pulmonary nodules. No pleural effusion  or pneumothorax.  Visualized thyroid is a mildly prominent in size.  The heart is mildly enlarged. No pericardial effusion.  No suspicious mediastinal, hilar, or axillary lymphadenopathy.  Visualized upper abdomen is unremarkable.  Mild degenerative changes of the visualized thoracolumbar spine.  Review of the MIP images confirms the above findings.  IMPRESSION: No evidence of pulmonary embolism.  No evidence of acute cardiopulmonary disease.  Extravasation of contrast at the IV site (right antecubital fossa), as described above. [name] was notified in the ED on 11/18/2012 at 1645 hrs and the patient returned to the ED for further evaluation/care.   Electronically Signed   By: Charline Bills M.D.   On: 11/18/2012 17:02   EKG: Sinus rhythm Left anterior fascicular block Low voltage, precordial leads Consider anterior infarct Baseline wander in lead(s) V1  Assessment/Plan Active Problems:   Atypical chest pain: With radiation to back. We'll check lipase, liver function tests. Echocardiogram and ESR. Cycle troponins. Observation on telemetry   Injection site extravasation of IV contrast: At this point, more concerned about her arm swelling post extravasation. Discussed with  Dr. Adriana Vasquez who has consulted Dr. Ave Filter via telephone. Dr. Ave Filter recommends hand surgery consult and evaluation is currently pending. Keep the arm elevated and iced. N.p.o. pending orthopedic evaluation.   DM (diabetes mellitus), type 2,    Benign hypertension   Elevated cholesterol   Hypokalemia: Replete.  Code Status: full Family Communication: mother at bedside Disposition Plan: home  Time spent: 60 min  Christiane Ha [facility] Pager [phone]  If 7PM-7AM, please contact night-coverage [email or web address] Password [email or web address] 11/18/2012, 7:24 PM

## 2012-11-18 NOTE — Progress Notes (Signed)
Subjective:     Patient reports pain as mild.    Objective: Vital signs in last 24 hours: Temp:  [97.9 F (36.6 C)-98.3 F (36.8 C)] 98.3 F (36.8 C) (09/18 2035) Pulse Rate:  [85-98] 85 (09/18 2035) Resp:  [12-26] 16 (09/18 2035) BP: (108-153)/(61-96) 125/85 mmHg (09/18 2035) SpO2:  [93 %-99 %] 98 % (09/18 2035) Weight:  [190 lb (86.183 kg)] 190 lb (86.183 kg) (09/18 2035)  Intake/Output from previous day:   Intake/Output this shift:     Recent Labs  11/18/12 1257  HGB 14.1    Recent Labs  11/18/12 1257  WBC 10.0  RBC 5.02  HCT 40.6  PLT 291    Recent Labs  11/18/12 1257  NA 138  K 3.2*  CL 96  CO2 29  BUN 10  CREATININE 0.69  GLUCOSE 102*  CALCIUM 9.7   No results found for this basename: LABPT, INR,  in the last 72 hours  intact sensation and capillary refill all digits.  full ROM digits/hand with no pain.  near full ROM elbow with no pain.  feels tightness, but no pain.  compartments soft and swelling significantly decreased since evaluation in ED.  forearm and upper arm soft to palpation.  no tenderness to palpation.  Assessment/Plan:     No signs of compartment syndrome.  Swelling improving.  Will recheck again later; expect continued improvement.    [name] 11/18/2012, 9:26 PM

## 2012-11-18 NOTE — ED Provider Notes (Signed)
CSN: [id number]     Arrival date & time 11/18/12  1157 History   First MD Initiated Contact with Patient 11/18/12 1217     Chief Complaint  Patient presents with  . Chest Pain  . Cough   (Consider location/radiation/quality/duration/timing/severity/associated sxs/prior Treatment) HPI  [name] is a 47 y.o. female HTN,  NIDDM, high cholesterol sent from PCP to evaluate CP. Chest pain started yesterday at 3 PM while she was at work typing on a computer. There was no exertion beforehand. She states the pain started in the left scapular region radiated down the arm to the fingertips. It is also associated with a left anterior chest pain. She cannot describe the character of the pain she rates it as severe, it is with her constantly but has exacerbations lasting one to 2 minutes. She cannot identify any exacerbating or alleviating factors. It is associated with shortness of breath. Patient denies nausea, vomiting, dyspepsia, diaphoresis. Patient has had 2 weeks of productive cough associated with posttussive emesis. There is no fever. 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 Denies recent immobilization, calf swelling or tenderness. Patient reports that she has stress test 2 years ago and that was normal. She's never seen a cardiologist. She denies any   Past Medical History  Diagnosis Date  . Hypertension   . Endometriosis   . Endometrial polyp   . Diabetes mellitus   . Elevated cholesterol   . Arthritis    Past Surgical History  Procedure Laterality Date  . Knee surgery    . Wrist surgery      gang. cyst  . Diagnostic laparoscopy  1993    with laser adhesions  . Rotator cuff repair    . Combined hysteroscopy diagnostic / d&c    . Oophorectomy  2008    left  . Intrauterine device insertion  03/2006    mirena   Family History  Problem Relation Age of Onset  . Hypertension Mother   . Diabetes Mother   .  Hypertension Father   . Diabetes Sister   . Hypertension Brother   . Diabetes Maternal Grandfather    History  Substance Use Topics  . Smoking status: Never Smoker   . Smokeless tobacco: Never Used  . Alcohol Use: Yes     Comment: rare   OB History   Grav Para Term Preterm Abortions TAB SAB Ect Mult Living   2 1 1  1     1      Review of Systems 10 systems reviewed and found to be negative, except as noted in the HPI  Allergies  Sulfa antibiotics and Clindamycin/lincomycin  Home Medications   Current Outpatient Rx  Name  Route  Sig  Dispense  Refill  . atorvastatin (LIPITOR) 20 MG tablet   Oral   Take 20 mg by mouth at bedtime.         Marland Kitchen estradiol (ESTRACE) 1 MG tablet   Oral   Take 1 tablet (1 mg total) by mouth daily.   30 tablet   11   . furosemide (LASIX) 20 MG tablet   Oral   Take 20 mg by mouth 2 (two) times daily as needed (fluid).          Marland Kitchen ibuprofen (ADVIL,MOTRIN) 400 MG tablet   Oral   Take 800 mg by mouth 3 (three) times daily.         Marland Kitchen  ibuprofen (ADVIL,MOTRIN) 800 MG tablet   Oral   Take 1 tablet (800 mg total) by mouth every 8 (eight) hours as needed for pain.   30 tablet   0   . meloxicam (MOBIC) 7.5 MG tablet   Oral   Take 7.5 mg by mouth daily.         Marland Kitchen olmesartan-hydrochlorothiazide (BENICAR HCT) 40-12.5 MG per tablet   Oral   Take 1 tablet by mouth daily.         . saxagliptin HCl (ONGLYZA) 2.5 MG TABS tablet   Oral   Take 5 mg by mouth every morning.         Marland Kitchen levonorgestrel (MIRENA) 20 MCG/24HR IUD   Intrauterine   1 each by Intrauterine route once.           BP 153/96  Pulse 86  Temp(Src) 97.9 F (36.6 C) (Oral)  SpO2 99% Physical Exam  Nursing note and vitals reviewed. Constitutional: She is oriented to person, place, and time. She appears well-developed and well-nourished. No distress.  HENT:  Head: Normocephalic.  Mouth/Throat: Oropharynx is clear and moist.  Eyes: Conjunctivae and EOM are normal.  Pupils are equal, round, and reactive to light.  Neck: No JVD present.  Cardiovascular: Normal rate, regular rhythm and intact distal pulses.   Pulmonary/Chest: Effort normal and breath sounds normal. No stridor. No respiratory distress. She has no wheezes. She has no rales. She exhibits no tenderness.  Abdominal: Soft. Bowel sounds are normal. She exhibits no distension and no mass. There is no tenderness. There is no rebound and no guarding.  Musculoskeletal: Normal range of motion. She exhibits no edema.  No calf asymmetry, superficial collaterals, palpable cords, edema, Homans sign negative bilaterally.    Neurological: She is alert and oriented to person, place, and time.  Psychiatric: She has a normal mood and affect.    ED Course  Procedures (including critical care time) Labs Review Labs Reviewed  BASIC METABOLIC PANEL - Abnormal; Notable for the following:    Potassium 3.2 (*)    Glucose, Bld 102 (*)    All other components within normal limits  CBC  POCT I-STAT TROPONIN I   Imaging Review Dg Chest 2 View  11/18/2012   CLINICAL DATA:  Chest pain  EXAM: CHEST  2 VIEW  COMPARISON:  None.  FINDINGS: The lungs are clear. Heart size and pulmonary vascularity are normal. No adenopathy. No bone lesions. There is mild upper thoracic levoscoliosis. No pneumothorax.  IMPRESSION: No edema or consolidation.   Electronically Signed   By: Bretta Bang   On: 11/18/2012 14:24   Ct Angio Chest Pe W/cm &/or Wo Cm  11/18/2012   CLINICAL DATA:  Left chest pain, shortness of breast  EXAM: CT ANGIOGRAPHY CHEST WITH CONTRAST  TECHNIQUE: Multidetector CT imaging of the chest was performed using the standard protocol during bolus administration of intravenous contrast. Multiplanar CT image reconstructions including MIPs were obtained to evaluate the vascular anatomy.  CONTRAST:  OMNIPAQUE IOHEXOL 350 MG/ML SOLN  Note: During contrast administration, there was extravasation of contrast at the  IV site (right antecubital fossa). The total extravasation volume likely did not exceed 30-40 mL based on the diagnostic quality of the study. However, the patient exhibited significant discomfort and swelling. The patient was evaluated by Dr Derinda Late, an ice pack was administered, and the arm was elevated. Joni Reining Neftali Thurow was notified in the ED on 11/18/2012 at 1645 hrs and the patient returned to the  ED for further evaluation/care.  COMPARISON:  Chest radiographs dated 11/18/2012.  FINDINGS: No evidence of pulmonary embolism.  Lungs are clear. No suspicious pulmonary nodules. No pleural effusion or pneumothorax.  Visualized thyroid is a mildly prominent in size.  The heart is mildly enlarged. No pericardial effusion.  No suspicious mediastinal, hilar, or axillary lymphadenopathy.  Visualized upper abdomen is unremarkable.  Mild degenerative changes of the visualized thoracolumbar spine.  Review of the MIP images confirms the above findings.  IMPRESSION: No evidence of pulmonary embolism.  No evidence of acute cardiopulmonary disease.  Extravasation of contrast at the IV site (right antecubital fossa), as described above. Joni Reining Donella Pascarella was notified in the ED on 11/18/2012 at 1645 hrs and the patient returned to the ED for further evaluation/care.   Electronically Signed   By: Charline Bills M.D.   On: 11/18/2012 17:02    Date: 11/18/2012  Rate: 82  Rhythm: normal sinus rhythm  QRS Axis: left  Intervals: normal  ST/T Wave abnormalities: nonspecific T wave changes  Conduction Disutrbances:left anterior fascicular block  Narrative Interpretation:   Old EKG Reviewed: unchanged  4:15 PM pain is not improved after morphine, nitroglycerin and Valium. I will give the patient Dilaudid and heat pack.  4:46 PM radiologist called and informed me that during the PE study there was extravasation of contrast, there still able to get a diagnostic study. Roughly 30 cc extravasated. There is swelling and tenderness  to palpation in the right a.c., small blistering on the lateral side.   MDM   1. Chest pain   2. Extravasation injury     Filed Vitals:   11/18/12 1330 11/18/12 1400 11/18/12 1404 11/18/12 1430  BP: 129/81 108/61 108/61 114/78  Pulse: 89 98 90 85  Temp:      TempSrc:      Resp: 20 26 12 17   SpO2: 95% 93% 98% 98%     [name] is a 47 y.o. female with past medical history significant for non-insulin-dependent diabetes, hypertension and high cholesterol sent by her primary care physician at [facility] family [facility] for evaluation of chest pain at rest starting 24 hours ago. Pain radiates to the scapula and down the left arm, his been constant with episodes of severe exacerbation she cannot identify any exacerbating or alleviating factors. EKG is nonischemic and troponin is negative. Patient has a history of amount DVT, she is not anticoagulated and is taking exogenous estrogen. CTA rules out pulmonary embolism. Patient has severe pain, 8/10 which is not alleviated by nitroglycerin, morphine, Dilaudid, Valium. She will need admission for a low risk chest pain rule out.   This will be admitted to [facility] hospitalist Dr. Lendell Caprice. Hand surgeon Dr. Merlyn Lot will come to evaluate the patient.   Medications  nitroGLYCERIN (NITROSTAT) SL tablet 0.4 mg (0.4 mg Sublingual Given 11/18/12 1336)  aspirin chewable tablet 324 mg (0 mg Oral Hold 11/18/12 1321)  morphine 4 MG/ML injection 4 mg (4 mg Intravenous Given 11/18/12 1410)  potassium chloride SA (K-DUR,KLOR-CON) CR tablet 40 mEq (40 mEq Oral Given 11/18/12 1456)  diazepam (VALIUM) tablet 5 mg (5 mg Oral Given 11/18/12 1512)  iohexol (OMNIPAQUE) 350 MG/ML injection 100 mL (100 mLs Intravenous Contrast Given 11/18/12 1627)  ketorolac (TORADOL) 30 MG/ML injection 30 mg (30 mg Intravenous Given 11/18/12 1912)    Note: Portions of this report may have been transcribed using voice recognition software. Every effort was made to ensure accuracy; however,  inadvertent computerized transcription errors may be present  Wynetta Emery, PA-C 11/18/12 1926

## 2012-11-18 NOTE — ED Notes (Signed)
Right arm circumference measurement at point of skin marker 13.25 inches.

## 2012-11-19 ENCOUNTER — Encounter (HOSPITAL_COMMUNITY): Payer: Self-pay | Admitting: Anesthesiology

## 2012-11-19 DIAGNOSIS — R072 Precordial pain: Secondary | ICD-10-CM

## 2012-11-19 DIAGNOSIS — R079 Chest pain, unspecified: Secondary | ICD-10-CM

## 2012-11-19 DIAGNOSIS — E119 Type 2 diabetes mellitus without complications: Secondary | ICD-10-CM

## 2012-11-19 LAB — TROPONIN I: Troponin I: <0.3 ng/mL (ref <0.30)

## 2012-11-19 MED ORDER — PNEUMOCOCCAL VAC POLYVALENT 25 MCG/0.5ML IJ INJ: 0.5000 mL | INJECTION | INTRAMUSCULAR | Status: DC

## 2012-11-19 MED ORDER — INFLUENZA VAC SPLIT QUAD 0.5 ML IM SUSP
0.5000 mL | Freq: Once | INTRAMUSCULAR | Status: AC
  Administered 2012-11-19: 0.5 mL via INTRAMUSCULAR
  Filled 2012-11-19: qty 0.5

## 2012-11-19 MED ORDER — METHOCARBAMOL 500 MG PO TABS
500.0000 mg | ORAL_TABLET | Freq: Four times a day (QID) | ORAL | Status: DC | PRN
  Administered 2012-11-19: 500 mg via ORAL
  Filled 2012-11-19 (×3): qty 1

## 2012-11-19 MED ORDER — METHOCARBAMOL 500 MG PO TABS: 500.0000 mg | ORAL_TABLET | Freq: Four times a day (QID) | ORAL | Status: DC | PRN

## 2012-11-19 MED ORDER — PANTOPRAZOLE SODIUM 40 MG PO TBEC
40.0000 mg | DELAYED_RELEASE_TABLET | Freq: Every day | ORAL | Status: DC
  Administered 2012-11-19: 40 mg via ORAL
  Filled 2012-11-19: qty 1

## 2012-11-19 MED ORDER — PANTOPRAZOLE SODIUM 40 MG PO TBEC: 40.0000 mg | DELAYED_RELEASE_TABLET | Freq: Every day | ORAL | Status: DC

## 2012-11-19 MED ORDER — ASPIRIN 81 MG PO TBEC: 81.0000 mg | DELAYED_RELEASE_TABLET | Freq: Every day | ORAL | Status: DC

## 2012-11-19 MED ORDER — INFLUENZA VAC SPLIT QUAD 0.5 ML IM SUSP: 0.5000 mL | INTRAMUSCULAR | Status: DC

## 2012-11-19 MED ORDER — PNEUMOCOCCAL VAC POLYVALENT 25 MCG/0.5ML IJ INJ
0.5000 mL | INJECTION | Freq: Once | INTRAMUSCULAR | Status: AC
  Administered 2012-11-19: 0.5 mL via INTRAMUSCULAR
  Filled 2012-11-19: qty 0.5

## 2012-11-19 NOTE — Progress Notes (Signed)
Subjective:     Patient reports pain as minimal.    Objective: Vital signs in last 24 hours: Temp:  [97.9 F (36.6 C)-98.3 F (36.8 C)] 98.3 F (36.8 C) (09/18 2035) Pulse Rate:  [85-98] 85 (09/18 2035) Resp:  [12-26] 16 (09/18 2035) BP: (108-153)/(61-96) 125/85 mmHg (09/18 2035) SpO2:  [93 %-99 %] 98 % (09/18 2035) Weight:  [190 lb (86.183 kg)] 190 lb (86.183 kg) (09/18 2035)  Intake/Output from previous day:   Intake/Output this shift:     Recent Labs  11/18/12 1257  HGB 14.1    Recent Labs  11/18/12 1257  WBC 10.0  RBC 5.02  HCT 40.6  PLT 291    Recent Labs  11/18/12 1257  NA 138  K 3.2*  CL 96  CO2 29  BUN 10  CREATININE 0.69  GLUCOSE 102*  CALCIUM 9.7   No results found for this basename: LABPT, INR,  in the last 72 hours  intact sensation and capillary refill all digits.  moving digits/wrist/elbow well without pain.  swelling significantly decreased.  compartments soft with no tenderness.  some tenderness directly proximal to iv infiltration site.    Assessment/Plan:     No signs of compartment syndrome.  Continuing to improve.  May follow up as outpatient as needed.   Ricky Gallery R 11/19/2012, 12:16 AM

## 2012-11-19 NOTE — Progress Notes (Signed)
Utilization review completed.  

## 2012-11-19 NOTE — Progress Notes (Signed)
  Echocardiogram 2D Echocardiogram has been performed.  ALVIE, SPELTZ 11/19/2012, 9:42 AM

## 2012-11-19 NOTE — Discharge Summary (Signed)
Physician Discharge Summary  [name] ZOX:[id number] DOB: 10-24-1965 DOA: 11/18/2012  PCP: Isabel Patella, MD  Admit date: 11/18/2012 Discharge date: 11/19/2012  Time spent: 35 minutes  Recommendations for Outpatient Follow-up:    Discharge Diagnoses:  Active Problems:   Elevated cholesterol   Atypical chest pain   Injection site extravasation of IV contrast   DM (diabetes mellitus), type 2   Benign hypertension   Hypokalemia   Discharge Condition: improved  Diet recommendation: cardiac/diabetic  Filed Weights   11/18/12 2035  Weight: 86.183 kg (190 lb)    History of present illness:  [name] is a 47 y.o. female who began having essentially constant substernal and left-sided chest pain yesterday. Radiated to intrascapular area and mid back. Shortness of breath. No fevers or chills. Reports having had a negative stress test at [facility] in 2012 though I do not see results in Epic. She has a history of diabetes, hypertension, hyperlipidemia. CT angiogram of the chest showed no pulmonary embolus. Also, patient had extravasation of dye during CAT scan. She's complaining of arm pain swelling and blistering   Hospital Course:  IV infiltration- seen by ortho- arm soft, NT Musculoskeletal pain- robaxin, heat, stress reduction advised DM- continue home meds Atypical chest pain- CE negative, tele ok, echo  Procedures:  Echo: Left ventricle: The cavity size was normal. Wall thickness was normal. Systolic function was normal. The estimated ejection fraction was in the range of 60% to 65%. Regional wall motion abnormalities cannot be excluded. Left ventricular diastolic function parameters were normal   Consultations:  none  Discharge Exam: Filed Vitals:   11/19/12 0822  BP: 118/65  Pulse: 85  Temp: 97.7 F (36.5 C)  Resp:     General: A+Ox3, NAD Cardiovascular: rrr Respiratory: clear anterior  Discharge Instructions       Discharge Orders   Future Orders Complete By Expires   Diet - low sodium heart healthy  As directed    Diet Carb Modified  As directed    Discharge instructions  As directed    Comments:     Can follow up with ortho surgeon as needed Ice and elevate arm until swelling gone   Increase activity slowly  As directed        Medication List    STOP taking these medications       meloxicam 7.5 MG tablet  Commonly known as:  MOBIC      TAKE these medications       aspirin 81 MG EC tablet  Take 1 tablet (81 mg total) by mouth daily.     atorvastatin 20 MG tablet  Commonly known as:  LIPITOR  Take 20 mg by mouth at bedtime.     estradiol 1 MG tablet  Commonly known as:  ESTRACE  Take 1 tablet (1 mg total) by mouth daily.     furosemide 20 MG tablet  Commonly known as:  LASIX  Take 20 mg by mouth 2 (two) times daily as needed (fluid).     ibuprofen 400 MG tablet  Commonly known as:  ADVIL,MOTRIN  Take 800 mg by mouth 3 (three) times daily.     levonorgestrel 20 MCG/24HR IUD  Commonly known as:  MIRENA  1 each by Intrauterine route once.     methocarbamol 500 MG tablet  Commonly known as:  ROBAXIN  Take 1 tablet (500 mg total) by mouth every 6 (six) hours as needed.     olmesartan-hydrochlorothiazide 40-12.5 MG  per tablet  Commonly known as:  BENICAR HCT  Take 1 tablet by mouth daily.     pantoprazole 40 MG tablet  Commonly known as:  PROTONIX  Take 1 tablet (40 mg total) by mouth daily.     saxagliptin HCl 2.5 MG Tabs tablet  Commonly known as:  ONGLYZA  Take 5 mg by mouth every morning.       Allergies  Allergen Reactions  . Sulfa Antibiotics Hives  . Tylox [Oxycodone-Acetaminophen] Nausea And Vomiting  . Clindamycin/Lincomycin Rash   Follow-up Information   Call Tami Ribas, MD. (As needed)    Specialty:  Orthopedic Surgery   Contact information:   [address] [place] [address] [phone]        The results of significant  diagnostics from this hospitalization (including imaging, microbiology, ancillary and laboratory) are listed below for reference.    Significant Diagnostic Studies: Dg Chest 2 View  11/18/2012   CLINICAL DATA:  Chest pain  EXAM: CHEST  2 VIEW  COMPARISON:  None.  FINDINGS: The lungs are clear. Heart size and pulmonary vascularity are normal. No adenopathy. No bone lesions. There is mild upper thoracic levoscoliosis. No pneumothorax.  IMPRESSION: No edema or consolidation.   Electronically Signed   By: Bretta Bang   On: 11/18/2012 14:24   Ct Angio Chest Pe W/cm &/or Wo Cm  11/18/2012   CLINICAL DATA:  Left chest pain, shortness of breast  EXAM: CT ANGIOGRAPHY CHEST WITH CONTRAST  TECHNIQUE: Multidetector CT imaging of the chest was performed using the standard protocol during bolus administration of intravenous contrast. Multiplanar CT image reconstructions including MIPs were obtained to evaluate the vascular anatomy.  CONTRAST:  OMNIPAQUE IOHEXOL 350 MG/ML SOLN  Note: During contrast administration, there was extravasation of contrast at the IV site (right antecubital fossa). The total extravasation volume likely did not exceed 30-40 mL based on the diagnostic quality of the study. However, the patient exhibited significant discomfort and swelling. The patient was evaluated by Dr Derinda Late, an ice pack was administered, and the arm was elevated. [name] was notified in the ED on 11/18/2012 at 1645 hrs and the patient returned to the ED for further evaluation/care.  COMPARISON:  Chest radiographs dated 11/18/2012.  FINDINGS: No evidence of pulmonary embolism.  Lungs are clear. No suspicious pulmonary nodules. No pleural effusion or pneumothorax.  Visualized thyroid is a mildly prominent in size.  The heart is mildly enlarged. No pericardial effusion.  No suspicious mediastinal, hilar, or axillary lymphadenopathy.  Visualized upper abdomen is unremarkable.  Mild degenerative changes of the  visualized thoracolumbar spine.  Review of the MIP images confirms the above findings.  IMPRESSION: No evidence of pulmonary embolism.  No evidence of acute cardiopulmonary disease.  Extravasation of contrast at the IV site (right antecubital fossa), as described above. [name] was notified in the ED on 11/18/2012 at 1645 hrs and the patient returned to the ED for further evaluation/care.   Electronically Signed   By: Charline Bills M.D.   On: 11/18/2012 17:02    Microbiology: No results found for this or any previous visit (from the past 240 hour(s)).   Labs: Basic Metabolic Panel:  Recent Labs Lab 11/18/12 1257  NA 138  K 3.2*  CL 96  CO2 29  GLUCOSE 102*  BUN 10  CREATININE 0.69  CALCIUM 9.7   Liver Function Tests:  Recent Labs Lab 11/18/12 1257  AST 15  ALT 9  ALKPHOS 80  BILITOT  0.4  PROT 8.3  ALBUMIN 4.2    Recent Labs Lab 11/18/12 1257  LIPASE 42   No results found for this basename: AMMONIA,  in the last 168 hours CBC:  Recent Labs Lab 11/18/12 1257  WBC 10.0  HGB 14.1  HCT 40.6  MCV 80.9  PLT 291   Cardiac Enzymes:  Recent Labs Lab 11/18/12 1920 11/19/12 0520  TROPONINI <0.30 <0.30   BNP: BNP (last 3 results) No results found for this basename: PROBNP,  in the last 8760 hours CBG:  Recent Labs Lab 11/18/12 2037  GLUCAP 94       Signed:  Rishita Petron  [facility] 11/19/2012, 2:11 PM

## 2013-01-31 HISTORY — PX: OTHER SURGICAL HISTORY: SHX169

## 2013-03-04 ENCOUNTER — Ambulatory Visit (INDEPENDENT_AMBULATORY_CARE_PROVIDER_SITE_OTHER): Payer: 59 | Admitting: Gynecology

## 2013-03-04 ENCOUNTER — Encounter: Payer: Self-pay | Admitting: Gynecology

## 2013-03-04 DIAGNOSIS — N898 Other specified noninflammatory disorders of vagina: Secondary | ICD-10-CM

## 2013-03-04 DIAGNOSIS — A499 Bacterial infection, unspecified: Secondary | ICD-10-CM

## 2013-03-04 DIAGNOSIS — A5901 Trichomonal vulvovaginitis: Secondary | ICD-10-CM

## 2013-03-04 DIAGNOSIS — N760 Acute vaginitis: Secondary | ICD-10-CM

## 2013-03-04 DIAGNOSIS — A5900 Urogenital trichomoniasis, unspecified: Secondary | ICD-10-CM

## 2013-03-04 DIAGNOSIS — B9689 Other specified bacterial agents as the cause of diseases classified elsewhere: Secondary | ICD-10-CM

## 2013-03-04 LAB — WET PREP FOR TRICH, YEAST, CLUE: Yeast Wet Prep HPF POC: NONE SEEN

## 2013-03-04 MED ORDER — METRONIDAZOLE 500 MG PO TABS: 500.0000 mg | ORAL_TABLET | Freq: Two times a day (BID) | ORAL | Status: DC

## 2013-03-04 NOTE — Patient Instructions (Signed)
Take the Flagyl medication twice daily for 7 days. Avoid alcohol while taking. Followup if symptoms persist, worsen or recur. Have your partner evaluated because he will need to be treated with antibiotics or he will give the infection back to you.  Trichomoniasis Trichomoniasis is an infection, caused by the Trichomonas organism, that affects both women and men. In women, the outer female genitalia and the vagina are affected. In men, the penis is mainly affected, but the prostate and other reproductive organs can also be involved. Trichomoniasis is a sexually transmitted disease (STD) and is most often passed to another person through sexual contact. The majority of people who get trichomoniasis do so from a sexual encounter and are also at risk for other STDs. CAUSES   Sexual intercourse with an infected partner.  It can be present in swimming pools or hot tubs. SYMPTOMS   Abnormal gray-green frothy vaginal discharge in women.  Vaginal itching and irritation in women.  Itching and irritation of the area outside the vagina in women.  Penile discharge with or without pain in males.  Inflammation of the urethra (urethritis), causing painful urination.  Bleeding after sexual intercourse. RELATED COMPLICATIONS  Pelvic inflammatory disease.  Infection of the uterus (endometritis).  Infertility.  Tubal (ectopic) pregnancy.  It can be associated with other STDs, including gonorrhea and chlamydia, hepatitis B, and HIV. COMPLICATIONS DURING PREGNANCY  Early (premature) delivery.  Premature rupture of the membranes (PROM).  Low birth weight. DIAGNOSIS   Visualization of Trichomonas under the microscope from the vagina discharge.  Ph of the vagina greater than 4.5, tested with a test tape.  Trich Rapid Test.  Culture of the organism, but this is not usually needed.  It may be found on a Pap test.  Having a "strawberry cervix,"which means the cervix looks very red like a  strawberry. TREATMENT   You may be given medication to fight the infection. Inform your caregiver if you could be or are pregnant. Some medications used to treat the infection should not be taken during pregnancy.  Over-the-counter medications or creams to decrease itching or irritation may be recommended.  Your sexual partner will need to be treated if infected. HOME CARE INSTRUCTIONS   Take all medication prescribed by your caregiver.  Take over-the-counter medication for itching or irritation as directed by your caregiver.  Do not have sexual intercourse while you have the infection.  Do not douche or wear tampons.  Discuss your infection with your partner, as your partner may have acquired the infection from you. Or, your partner may have been the person who transmitted the infection to you.  Have your sex partner examined and treated if necessary.  Practice safe, informed, and protected sex.  See your caregiver for other STD testing. SEEK MEDICAL CARE IF:   You still have symptoms after you finish the medication.  You have an oral temperature above 102 F (38.9 C).  You develop belly (abdominal) pain.  You have pain when you urinate.  You have bleeding after sexual intercourse.  You develop a rash.  The medication makes you sick or makes you throw up (vomit). Document Released: 08/13/2000 Document Revised: 05/12/2011 Document Reviewed: 09/08/2008 [facility] Patient Information 2014 [place], [place].

## 2013-03-04 NOTE — Progress Notes (Signed)
Patient presents with several days of vaginal discharge, odor, irritation. No urinary symptoms such as frequency dysuria or urgency. Does have history of being treated for bacterial vaginosis in the past.  Exam with Maudie Mercury Assistant Abdomen soft nontender without masses guarding rebound organomegaly. Pelvic external BUS vagina with frothy yellow to white discharge area cervix normal. Uterus grossly normal size midline mobile nontender. Adnexa without masses or tenderness.  Assessment and plan: 1. Vaginal discharge. Exam and wet prep consistent with bacterial vaginosis and trichomoniasis. Will treat with Flagyl 500 mg twice a day x7 days, alcohol avoidance reviewed. Followup if symptoms persist, worsen or recur. Discussed possible STD nature of this and the need for her partner to be seen evaluated and treated so that he does not really affect her. Screening GC/chlamydia was done today. 2. IUD management. Patient has [name] and IUD string was not visualized. Review of her record shows similar in the past with ultrasound 04/2012 demonstrating intrauterine placement. No further evaluation needed at this time.

## 2013-03-06 LAB — GC/CHLAMYDIA PROBE AMP
CT PROBE, AMP APTIMA: NEGATIVE
GC Probe RNA: NEGATIVE

## 2013-04-04 ENCOUNTER — Encounter: Payer: 59 | Admitting: Gynecology

## 2013-04-08 ENCOUNTER — Telehealth: Payer: Self-pay | Admitting: *Deleted

## 2013-04-08 ENCOUNTER — Other Ambulatory Visit (HOSPITAL_COMMUNITY)
Admission: RE | Admit: 2013-04-08 | Discharge: 2013-04-08 | Disposition: A | Discharge status: Home / Self Care | Payer: 59 | Source: Ambulatory Visit | Attending: Gynecology | Admitting: Gynecology

## 2013-04-08 ENCOUNTER — Encounter: Payer: Self-pay | Admitting: Gynecology

## 2013-04-08 ENCOUNTER — Ambulatory Visit (INDEPENDENT_AMBULATORY_CARE_PROVIDER_SITE_OTHER): Payer: 59 | Admitting: Gynecology

## 2013-04-08 VITALS — BP 130/86 | Ht 58.5 in | Wt 191.0 lb

## 2013-04-08 DIAGNOSIS — T8332XA Displacement of intrauterine contraceptive device, initial encounter: Secondary | ICD-10-CM

## 2013-04-08 DIAGNOSIS — Z1151 Encounter for screening for human papillomavirus (HPV): Secondary | ICD-10-CM | POA: Insufficient documentation

## 2013-04-08 DIAGNOSIS — N760 Acute vaginitis: Secondary | ICD-10-CM

## 2013-04-08 DIAGNOSIS — Z30431 Encounter for routine checking of intrauterine contraceptive device: Secondary | ICD-10-CM

## 2013-04-08 DIAGNOSIS — A499 Bacterial infection, unspecified: Secondary | ICD-10-CM

## 2013-04-08 DIAGNOSIS — Z01419 Encounter for gynecological examination (general) (routine) without abnormal findings: Secondary | ICD-10-CM | POA: Insufficient documentation

## 2013-04-08 DIAGNOSIS — B9689 Other specified bacterial agents as the cause of diseases classified elsewhere: Secondary | ICD-10-CM

## 2013-04-08 DIAGNOSIS — T8389XA Other specified complication of genitourinary prosthetic devices, implants and grafts, initial encounter: Secondary | ICD-10-CM

## 2013-04-08 DIAGNOSIS — L293 Anogenital pruritus, unspecified: Secondary | ICD-10-CM

## 2013-04-08 DIAGNOSIS — N898 Other specified noninflammatory disorders of vagina: Secondary | ICD-10-CM

## 2013-04-08 DIAGNOSIS — Z113 Encounter for screening for infections with a predominantly sexual mode of transmission: Secondary | ICD-10-CM

## 2013-04-08 LAB — WET PREP FOR TRICH, YEAST, CLUE
Trich, Wet Prep: NONE SEEN
Yeast Wet Prep HPF POC: NONE SEEN

## 2013-04-08 MED ORDER — FLUCONAZOLE 150 MG PO TABS: 150.0000 mg | ORAL_TABLET | Freq: Once | ORAL | Status: DC

## 2013-04-08 MED ORDER — METRONIDAZOLE 0.75 % VA GEL: 1.0000 | Freq: Every day | VAGINAL | Status: DC

## 2013-04-08 MED ORDER — METRONIDAZOLE 0.75 % VA GEL: 1.0000 | Freq: Two times a day (BID) | VAGINAL | Status: DC

## 2013-04-08 NOTE — Telephone Encounter (Signed)
Pt was seen today for annual visit, pt was prescribed  metrogel vaginal cream x 1 week, no twice daily. New rx sent

## 2013-04-08 NOTE — Progress Notes (Signed)
[name] [birth year] [id number]   History:    48 y.o.  for annual gyn exam with no complaints today with the exception of slight vulvar pruritus. Patient was seen here in the office last month with a vaginal discharge and was noted to have trichomoniasis and BV and was treated with Flagyl. She had a negative GC and chlamydia culture. Upon questioning she has not sure if her partner ever got treated. She has a [name] that was placed several years ago. The patient denies any prior history of abnormal Pap smear. She has had one new sexual partner within the past year. Patient received her flu vaccine in September 2014. Her PCP Dr. Mariea Clonts has been doing her blood work.  Past medical history,surgical history, family history and social history were all reviewed and documented in the EPIC chart.  Gynecologic History No LMP recorded. Patient is not currently having periods (Reason: IUD). Contraception: IUD Last Pap: 2013. Results were: normal Last mammogram: 2014. Results were: normal  Obstetric History OB History  Gravida Para Term Preterm AB SAB TAB Ectopic Multiple Living  2 1 1  1     1     # Outcome Date GA Lbr Len/2nd Weight Sex Delivery Anes PTL Lv  2 ABT           1 TRM                ROS: A ROS was performed and pertinent positives and negatives are included in the history.  GENERAL: No fevers or chills. HEENT: No change in vision, no earache, sore throat or sinus congestion. NECK: No pain or stiffness. CARDIOVASCULAR: No chest pain or pressure. No palpitations. PULMONARY: No shortness of breath, cough or wheeze. GASTROINTESTINAL: No abdominal pain, nausea, vomiting or diarrhea, melena or bright red blood per rectum. GENITOURINARY: No urinary frequency, urgency, hesitancy or dysuria. MUSCULOSKELETAL: No joint or muscle pain, no back pain, no recent trauma. DERMATOLOGIC: No rash, no itching, no lesions. ENDOCRINE: No polyuria, polydipsia, no heat or cold intolerance. No recent  change in weight. HEMATOLOGICAL: No anemia or easy bruising or bleeding. NEUROLOGIC: No headache, seizures, numbness, tingling or weakness. PSYCHIATRIC: No depression, no loss of interest in normal activity or change in sleep pattern.     Exam: chaperone present  BP 130/86  Ht 4' 10.5" (1.486 m)  Wt 191 lb (86.637 kg)  BMI 39.23 kg/m2  Body mass index is 39.23 kg/(m^2).  General appearance : Well developed well nourished female. No acute distress HEENT: Neck supple, trachea midline, no carotid bruits, no thyroidmegaly Lungs: Clear to auscultation, no rhonchi or wheezes, or rib retractions  Heart: Regular rate and rhythm, no murmurs or gallops Breast:Examined in sitting and supine position were symmetrical in appearance, no palpable masses or tenderness,  no skin retraction, no nipple inversion, no nipple discharge, no skin discoloration, no axillary or supraclavicular lymphadenopathy Abdomen: no palpable masses or tenderness, no rebound or guarding Extremities: no edema or skin discoloration or tenderness  Pelvic:  Bartholin, Urethra, Skene Glands: Within normal limits             Vagina: No gross lesions or discharge  Cervix: No gross lesions or discharge, IUD string not seen  Uterus  anteverted, normal size, shape and consistency, non-tender and mobile  Adnexa  Without masses or tenderness  Anus and perineum  normal   Rectovaginal  normal sphincter tone without palpated masses or tenderness  Hemoccult not indicated   Wet prep moderate bacteria rare WBC rare clue cell  Assessment/Plan:  48 y.o. female for annual exam treated one month ago for trichomoniasis and BV. GC and chlamydia culture was obtained today along with a RPR, HIV, hepatitis B and hepatitis C. She will be called and MetroGel to apply intravaginally each bedtime for one week. We'll wait for the above test results. She was reminded to encourage her partner to be treated for trichomoniasis before she engages  in sexual activity with him. Patient will return back to the office in a few weeks for an ultrasound to confirm position of the IUD since the string was not visualized today. She was reminded to do her mammogram in June of this year. PCP has draw her blood work. A prescription for Diflucan 150 mg was provided as well.  Note: This dictation was prepared with  Dragon/digital dictation along withSmart phrase technology. Any transcriptional errors that result from this process are unintentional.   Terrance Mass MD, 11:07 AM 04/08/2013

## 2013-04-08 NOTE — Patient Instructions (Signed)

## 2013-04-09 LAB — GC/CHLAMYDIA PROBE AMP
CT Probe RNA: NEGATIVE
GC Probe RNA: NEGATIVE

## 2013-05-06 ENCOUNTER — Other Ambulatory Visit: Payer: 59

## 2013-05-06 ENCOUNTER — Ambulatory Visit: Payer: 59 | Admitting: Gynecology

## 2013-05-09 ENCOUNTER — Other Ambulatory Visit: Payer: Self-pay | Admitting: Gynecology

## 2013-05-25 ENCOUNTER — Other Ambulatory Visit: Payer: 59

## 2013-05-25 ENCOUNTER — Ambulatory Visit: Payer: 59 | Admitting: Gynecology

## 2013-06-08 ENCOUNTER — Other Ambulatory Visit: Payer: Self-pay | Admitting: Gynecology

## 2013-06-08 ENCOUNTER — Ambulatory Visit (INDEPENDENT_AMBULATORY_CARE_PROVIDER_SITE_OTHER): Payer: 59

## 2013-06-08 ENCOUNTER — Ambulatory Visit (INDEPENDENT_AMBULATORY_CARE_PROVIDER_SITE_OTHER): Payer: 59 | Admitting: Gynecology

## 2013-06-08 ENCOUNTER — Encounter: Payer: Self-pay | Admitting: Gynecology

## 2013-06-08 VITALS — BP 126/84

## 2013-06-08 DIAGNOSIS — D259 Leiomyoma of uterus, unspecified: Secondary | ICD-10-CM

## 2013-06-08 DIAGNOSIS — B3731 Acute candidiasis of vulva and vagina: Secondary | ICD-10-CM

## 2013-06-08 DIAGNOSIS — B373 Candidiasis of vulva and vagina: Secondary | ICD-10-CM

## 2013-06-08 DIAGNOSIS — T8389XA Other specified complication of genitourinary prosthetic devices, implants and grafts, initial encounter: Principal | ICD-10-CM

## 2013-06-08 DIAGNOSIS — Z30431 Encounter for routine checking of intrauterine contraceptive device: Secondary | ICD-10-CM

## 2013-06-08 DIAGNOSIS — N9089 Other specified noninflammatory disorders of vulva and perineum: Secondary | ICD-10-CM

## 2013-06-08 DIAGNOSIS — T8332XA Displacement of intrauterine contraceptive device, initial encounter: Secondary | ICD-10-CM

## 2013-06-08 DIAGNOSIS — N831 Corpus luteum cyst of ovary, unspecified side: Secondary | ICD-10-CM

## 2013-06-08 DIAGNOSIS — N83209 Unspecified ovarian cyst, unspecified side: Secondary | ICD-10-CM

## 2013-06-08 LAB — WET PREP FOR TRICH, YEAST, CLUE
CLUE CELLS WET PREP: NONE SEEN
Trich, Wet Prep: NONE SEEN

## 2013-06-08 MED ORDER — FLUCONAZOLE 150 MG PO TABS: 150.0000 mg | ORAL_TABLET | Freq: Once | ORAL | Status: DC

## 2013-06-08 NOTE — Progress Notes (Signed)
   The patient presented to the office today to discuss her ultrasound that was ordered at the time of her last visit on February 6 as a result of the IUD string not having been visualized. She was also complaining some slight vulvar parotid.  Ultrasound today: Uterus measured 8.0 x 4.5 x 4.2 cm with endometrial stripe of 1.5 mm. The IUD was seen in the normal position. There was a right ovarian thick wall cyst measuring 21 x 15 x 17 mm with internal low level echoes in the lumen and a small feeder vessel to the cystic mass noted there was no fluid in the cul-de-sac left adnexa was normal.  Assessment/plan: IUD migration present in the normal position in the uterine cavity based on ultrasound findings today. Small ovarian cyst possibly hemorrhagic will followup with ultrasound in 4 months. Patient was complaining simple vulvar pruritus and a wet prep demonstrated evidence of Moniliasis and will be prescribed Diflucan 150 mg one by mouth.

## 2013-06-08 NOTE — Patient Instructions (Addendum)
Ovarian Cyst An ovarian cyst is a fluid-filled sac that forms on an ovary. The ovaries are small organs that produce eggs in women. Various types of cysts can form on the ovaries. Most are not cancerous. Many do not cause problems, and they often go away on their own. Some may cause symptoms and require treatment. Common types of ovarian cysts include:  Functional cysts These cysts may occur every month during the menstrual cycle. This is normal. The cysts usually go away with the next menstrual cycle if the woman does not get pregnant. Usually, there are no symptoms with a functional cyst.  Endometrioma cysts These cysts form from the tissue that lines the uterus. They are also called "chocolate cysts" because they become filled with blood that turns brown. This type of cyst can cause pain in the lower abdomen during intercourse and with your menstrual period.  Cystadenoma cysts This type develops from the cells on the outside of the ovary. These cysts can get very big and cause lower abdomen pain and pain with intercourse. This type of cyst can twist on itself, cut off its blood supply, and cause severe pain. It can also easily rupture and cause a lot of pain.  Dermoid cysts This type of cyst is sometimes found in both ovaries. These cysts may contain different kinds of body tissue, such as skin, teeth, hair, or cartilage. They usually do not cause symptoms unless they get very big.  Theca lutein cysts These cysts occur when too much of a certain hormone (human chorionic gonadotropin) is produced and overstimulates the ovaries to produce an egg. This is most common after procedures used to assist with the conception of a baby (in vitro fertilization). CAUSES   Fertility drugs can cause a condition in which multiple large cysts are formed on the ovaries. This is called ovarian hyperstimulation syndrome.  A condition called polycystic ovary syndrome can cause hormonal imbalances that can lead to  nonfunctional ovarian cysts. SIGNS AND SYMPTOMS  Many ovarian cysts do not cause symptoms. If symptoms are present, they may include:  Pelvic pain or pressure.  Pain in the lower abdomen.  Pain during sexual intercourse.  Increasing girth (swelling) of the abdomen.  Abnormal menstrual periods.  Increasing pain with menstrual periods.  Stopping having menstrual periods without being pregnant. DIAGNOSIS  These cysts are commonly found during a routine or annual pelvic exam. Tests may be ordered to find out more about the cyst. These tests may include:  Ultrasound.  X-ray of the pelvis.  CT scan.  MRI.  Blood tests. TREATMENT  Many ovarian cysts go away on their own without treatment. Your health care provider may want to check your cyst regularly for 2 3 months to see if it changes. For women in menopause, it is particularly important to monitor a cyst closely because of the higher rate of ovarian cancer in menopausal women. When treatment is needed, it may include any of the following:  A procedure to drain the cyst (aspiration). This may be done using a long needle and ultrasound. It can also be done through a laparoscopic procedure. This involves using a thin, lighted tube with a tiny camera on the end (laparoscope) inserted through a small incision.  Surgery to remove the whole cyst. This may be done using laparoscopic surgery or an open surgery involving a larger incision in the lower abdomen.  Hormone treatment or birth control pills. These methods are sometimes used to help dissolve a cyst. HOME CARE  INSTRUCTIONS   Only take over-the-counter or prescription medicines as directed by your health care provider.  Follow up with your health care provider as directed.  Get regular pelvic exams and Pap tests. SEEK MEDICAL CARE IF:   Your periods are late, irregular, or painful, or they stop.  Your pelvic pain or abdominal pain does not go away.  Your abdomen becomes  larger or swollen.  You have pressure on your bladder or trouble emptying your bladder completely.  You have pain during sexual intercourse.  You have feelings of fullness, pressure, or discomfort in your stomach.  You lose weight for no apparent reason.  You feel generally ill.  You become constipated.  You lose your appetite.  You develop acne.  You have an increase in body and facial hair.  You are gaining weight, without changing your exercise and eating habits.  You think you are pregnant. SEEK IMMEDIATE MEDICAL CARE IF:   You have increasing abdominal pain.  You feel sick to your stomach (nauseous), and you throw up (vomit).  You develop a fever that comes on suddenly.  You have abdominal pain during a bowel movement.  Your menstrual periods become heavier than usual. Document Released: 02/17/2005 Document Revised: 12/08/2012 Document Reviewed: 10/25/2012 [facility] Patient Information 2014 [place]. Monilial Vaginitis Vaginitis in a soreness, swelling and redness (inflammation) of the vagina and vulva. Monilial vaginitis is not a sexually transmitted infection. CAUSES  Yeast vaginitis is caused by yeast (candida) that is normally found in your vagina. With a yeast infection, the candida has overgrown in number to a point that upsets the chemical balance. SYMPTOMS  White, thick vaginal discharge. Swelling, itching, redness and irritation of the vagina and possibly the lips of the vagina (vulva). Burning or painful urination. Painful intercourse. DIAGNOSIS  Things that may contribute to monilial vaginitis are: Postmenopausal and virginal states. Pregnancy. Infections. Being tired, sick or stressed, especially if you had monilial vaginitis in the past. Diabetes. Good control will help lower the chance. Birth control pills. Tight fitting garments. Using bubble bath, feminine sprays, douches or deodorant tampons. Taking certain medications that kill germs  (antibiotics). Sporadic recurrence can occur if you become ill. TREATMENT  Your caregiver will give you medication. There are several kinds of anti monilial vaginal creams and suppositories specific for monilial vaginitis. For recurrent yeast infections, use a suppository or cream in the vagina 2 times a week, or as directed. Anti-monilial or steroid cream for the itching or irritation of the vulva may also be used. Get your caregiver's permission. Painting the vagina with methylene blue solution may help if the monilial cream does not work. Eating yogurt may help prevent monilial vaginitis. HOME CARE INSTRUCTIONS  Finish all medication as prescribed. Do not have sex until treatment is completed or after your caregiver tells you it is okay. Take warm sitz baths. Do not douche. Do not use tampons, especially scented ones. Wear cotton underwear. Avoid tight pants and panty hose. Tell your sexual partner that you have a yeast infection. They should go to their caregiver if they have symptoms such as mild rash or itching. Your sexual partner should be treated as well if your infection is difficult to eliminate. Practice safer sex. Use condoms. Some vaginal medications cause latex condoms to fail. Vaginal medications that harm condoms are: Cleocin cream. Butoconazole (Femstat). Terconazole (Terazol) vaginal suppository. Miconazole (Monistat) (may be purchased over the counter). SEEK MEDICAL CARE IF:  You have a temperature by mouth above 102 F (38.9  C). The infection is getting worse after 2 days of treatment. The infection is not getting better after 3 days of treatment. You develop blisters in or around your vagina. You develop vaginal bleeding, and it is not your menstrual period. You have pain when you urinate. You develop intestinal problems. You have pain with sexual intercourse. Document Released: 11/27/2004 Document Revised: 05/12/2011 Document Reviewed: 08/11/2008 [facility]  Patient Information 2014 [place], [place].

## 2013-07-26 ENCOUNTER — Other Ambulatory Visit: Payer: Self-pay | Admitting: Gynecology

## 2013-08-29 ENCOUNTER — Encounter: Payer: Self-pay | Admitting: Gynecology

## 2013-10-10 ENCOUNTER — Other Ambulatory Visit: Payer: 59

## 2013-10-10 ENCOUNTER — Ambulatory Visit: Payer: 59 | Admitting: Gynecology

## 2013-10-20 ENCOUNTER — Encounter: Payer: Self-pay | Admitting: Gynecology

## 2013-10-20 ENCOUNTER — Ambulatory Visit (INDEPENDENT_AMBULATORY_CARE_PROVIDER_SITE_OTHER): Payer: 59 | Admitting: Gynecology

## 2013-10-20 ENCOUNTER — Ambulatory Visit (INDEPENDENT_AMBULATORY_CARE_PROVIDER_SITE_OTHER): Payer: 59

## 2013-10-20 VITALS — BP 120/80

## 2013-10-20 DIAGNOSIS — R300 Dysuria: Secondary | ICD-10-CM

## 2013-10-20 DIAGNOSIS — L293 Anogenital pruritus, unspecified: Secondary | ICD-10-CM

## 2013-10-20 DIAGNOSIS — N83209 Unspecified ovarian cyst, unspecified side: Secondary | ICD-10-CM

## 2013-10-20 DIAGNOSIS — Z113 Encounter for screening for infections with a predominantly sexual mode of transmission: Secondary | ICD-10-CM

## 2013-10-20 DIAGNOSIS — N898 Other specified noninflammatory disorders of vagina: Secondary | ICD-10-CM

## 2013-10-20 DIAGNOSIS — N83201 Unspecified ovarian cyst, right side: Secondary | ICD-10-CM | POA: Insufficient documentation

## 2013-10-20 DIAGNOSIS — A599 Trichomoniasis, unspecified: Secondary | ICD-10-CM | POA: Insufficient documentation

## 2013-10-20 LAB — WET PREP FOR TRICH, YEAST, CLUE: Yeast Wet Prep HPF POC: NONE SEEN

## 2013-10-20 LAB — URINALYSIS W MICROSCOPIC + REFLEX CULTURE
Bilirubin Urine: NEGATIVE
Casts: NONE SEEN
Crystals: NONE SEEN
GLUCOSE, UA: NEGATIVE mg/dL
Ketones, ur: NEGATIVE mg/dL
NITRITE: NEGATIVE
PROTEIN: NEGATIVE mg/dL
Specific Gravity, Urine: 1.01 (ref 1.005–1.030)
UROBILINOGEN UA: 0.2 mg/dL (ref 0.0–1.0)
pH: 6.5 (ref 5.0–8.0)

## 2013-10-20 MED ORDER — TINIDAZOLE 500 MG PO TABS: ORAL_TABLET | ORAL | Status: DC

## 2013-10-20 NOTE — Patient Instructions (Signed)
CA-125 Tumor Marker CA 125 is a tumor marker that is used to help monitor the course of ovarian or endometrial cancer. PREPARATION FOR TEST No preparation is necessary. NORMAL FINDINGS Adults: 0-35 units/mL (0-35 kilounits)/L Ranges for normal findings may vary among different laboratories and hospitals. You should always check with your doctor after having lab work or other tests done to discuss the meaning of your test results and whether your values are considered within normal limits. MEANING OF TEST  Your caregiver will go over the test results with you and discuss the importance and meaning of your results, as well as treatment options and the need for additional tests if necessary. OBTAINING THE TEST RESULTS It is your responsibility to obtain your test results. Ask the lab or department performing the test when and how you will get your results. Document Released: 03/11/2004 Document Revised: 05/12/2011 Document Reviewed: 01/26/2008 [facility] Patient Information 2015 [place], [place]. This information is not intended to replace advice given to you by your health care provider. Make sure you discuss any questions you have with your health care provider. Tinidazole tablets What is this medicine? TINIDAZOLE (tye NI da zole) is an antiinfective. It is used to treat amebiasis, giardiasis, trichomoniasis, and vaginosis. It will not work for colds, flu, or other viral infections. This medicine may be used for other purposes; ask your health care provider or pharmacist if you have questions. COMMON BRAND NAME(S): Tindamax What should I tell my health care provider before I take this medicine? They need to know if you have any of these conditions: -anemia or other blood disorders -if you frequently drink alcohol containing drinks -receiving hemodialysis -seizure disorder -an unusual or allergic reaction to tinidazole, other medicines, foods, dyes, or preservatives -pregnant or trying to get  pregnant -breast-feeding How should I use this medicine? Take this medicine by mouth with a full glass of water. Follow the directions on the prescription label. Take with food. Take your medicine at regular intervals. Do not take your medicine more often than directed. Take all of your medicine as directed even if you think you are better. Do not skip doses or stop your medicine early. Talk to your pediatrician regarding the use of this medicine in children. While this drug may be prescribed for children as young as 15 years of age for selected conditions, precautions do apply. Overdosage: If you think you have taken too much of this medicine contact a poison control center or emergency room at once. NOTE: This medicine is only for you. Do not share this medicine with others. What if I miss a dose? If you miss a dose, take it as soon as you can. If it is almost time for your next dose, take only that dose. Do not take double or extra doses. What may interact with this medicine? Do not take this medicine with any of the following medications: -alcohol or any product that contains alcohol -amprenavir oral solution -disulfiram -paclitaxel injection -ritonavir oral solution -sertraline oral solution -sulfamethoxazole-trimethoprim injection This medicine may also interact with the following medications: -cholestyramine -cimetidine -conivaptan -cyclosporin -fluorouracil -fosphenytoin, phenytoin -ketoconazole -lithium -phenobarbital -tacrolimus -warfarin This list may not describe all possible interactions. Give your health care provider a list of all the medicines, herbs, non-prescription drugs, or dietary supplements you use. Also tell them if you smoke, drink alcohol, or use illegal drugs. Some items may interact with your medicine. What should I watch for while using this medicine? Tell your doctor or health care professional  if your symptoms do not improve or if they get worse. Avoid  alcoholic drinks while you are taking this medicine and for three days afterward. Alcohol may make you feel dizzy, sick, or flushed. If you are being treated for a sexually transmitted disease, avoid sexual contact until you have finished your treatment. Your sexual partner may also need treatment. What side effects may I notice from receiving this medicine? Side effects that you should report to your doctor or health care professional as soon as possible: -allergic reactions like skin rash, itching or hives, swelling of the face, lips, or tongue -breathing problems -confusion, depression -dark or white patches in the mouth -feeling faint or lightheaded, falls -fever, infection -numbness, tingling, pain or weakness in the hands or feet -pain when passing urine -seizures -unusually weak or tired -vaginal irritation or discharge -vomiting Side effects that usually do not require medical attention (report to your doctor or health care professional if they continue or are bothersome): -dark brown or reddish urine -diarrhea -headache -loss of appetite -metallic taste -nausea -stomach upset This list may not describe all possible side effects. Call your doctor for medical advice about side effects. You may report side effects to FDA at 1-800-FDA-1088. Where should I keep my medicine? Keep out of the reach of children. Store at room temperature between 15 and 30 degrees C (59 and 86 degrees F). Protect from light and moisture. Keep container tightly closed. Throw away any unused medicine after the expiration date. NOTE: This sheet is a summary. It may not cover all possible information. If you have questions about this medicine, talk to your doctor, pharmacist, or health care provider.  2015, [employer] Standard. (2007-11-15 15:22:28) Trichomoniasis Trichomoniasis is an infection caused by an organism called Trichomonas. The infection can affect both women and men. In women, the outer  female genitalia and the vagina are affected. In men, the penis is mainly affected, but the prostate and other reproductive organs can also be involved. Trichomoniasis is a sexually transmitted infection (STI) and is most often passed to another person through sexual contact.  RISK FACTORS  Having unprotected sexual intercourse.  Having sexual intercourse with an infected partner. SIGNS AND SYMPTOMS  Symptoms of trichomoniasis in women include:  Abnormal gray-green frothy vaginal discharge.  Itching and irritation of the vagina.  Itching and irritation of the area outside the vagina. Symptoms of trichomoniasis in men include:   Penile discharge with or without pain.  Pain during urination. This results from inflammation of the urethra. DIAGNOSIS  Trichomoniasis may be found during a Pap test or physical exam. Your health care provider may use one of the following methods to help diagnose this infection:  Examining vaginal discharge under a microscope. For men, urethral discharge would be examined.  Testing the pH of the vagina with a test tape.  Using a vaginal swab test that checks for the Trichomonas organism. A test is available that provides results within a few minutes.  Doing a culture test for the organism. This is not usually needed. TREATMENT   You may be given medicine to fight the infection. Women should inform their health care provider if they could be or are pregnant. Some medicines used to treat the infection should not be taken during pregnancy.  Your health care provider may recommend over-the-counter medicines or creams to decrease itching or irritation.  Your sexual partner will need to be treated if infected. HOME CARE INSTRUCTIONS   Take medicines only as directed by your  health care provider.  Take over-the-counter medicine for itching or irritation as directed by your health care provider.  Do not have sexual intercourse while you have the  infection.  Women should not douche or wear tampons while they have the infection.  Discuss your infection with your partner. Your partner may have gotten the infection from you, or you may have gotten it from your partner.  Have your sex partner get examined and treated if necessary.  Practice safe, informed, and protected sex.  See your health care provider for other STI testing. SEEK MEDICAL CARE IF:   You still have symptoms after you finish your medicine.  You develop abdominal pain.  You have pain when you urinate.  You have bleeding after sexual intercourse.  You develop a rash.  Your medicine makes you sick or makes you throw up (vomit). MAKE SURE YOU:  Understand these instructions.  Will watch your condition.  Will get help right away if you are not doing well or get worse. Document Released: 08/13/2000 Document Revised: 07/04/2013 Document Reviewed: 11/29/2012 [facility] Patient Information 2015 [place], [place]. This information is not intended to replace advice given to you by your health care provider. Make sure you discuss any questions you have with your health care provider.

## 2013-10-20 NOTE — Progress Notes (Signed)
   Patient presented to the office today for ultrasound and followup on ovarian cysts. She was complaining of vaginal irritation and discharge. The ultrasound originally had been done due the fact that at time of her annual exam the IUD string from her Mirena was not visualized. The ultrasound demonstrated the following:  Uterus measured 8.0 x 4.5 x 4.2 cm with endometrial stripe of 1.5 mm. The IUD was seen in the normal position. There was a right ovarian thick wall cyst measuring 21 x 15 x 17 mm with internal low level echoes in the lumen and a small feeder vessel to the cystic mass noted there was no fluid in the cul-de-sac left adnexa was normal.   Today's ultrasound demonstrated the following: Uterus measured 8.1 x 4.3 x 3.7 cm with endometrial stripe 1.3 mm. IUD was seen in the normal position. Right ovary continued presence of a thick wall cyst measuring 14 x 16 x 14 mm average size 16 mm slight decrease in size from previous scan. The regular cystic lumen avascular. Left adnexa absent due to the fact the patient had a left salpingo-oophorectomy the past for hemorrhagic cyst. Patient has had history of endometriosis in the past.  Patient was complaining as well as some dysuria so a wet prep and urinalysis was done. Before we did the wet prep a urinalysis that demonstrated trichomoniasis with many bacteria along with many white blood cells and few RBC. We proceeded with doing a GC chlamydia culture and wet prep which confirmed evidence of trichomoniasis, many clue cells, many WBC and too numerous to count bacteria.  In January of this year the patient was treated for trichomoniasis. She returned 2 months later for test of cure and was negative as well as for GC and chlamydia.  Assessment/: #1 persistent small right ovarian cyst decreasing in size. Past history of endometriosis and hemorrhagic cyst. She will return back in 6 months for follow up ultrasound time of her annual exam. She was  otherwise asymptomatic. We will check a CA 125 today. Its limitations were discussed with the patient. #2 trichomoniasis will be treated with Tindamax 500 mg 4 tablets today repeat in 24 hours. A GC and chlamydia culture was obtained today. To complete the STD screen and HIV, RPR, hepatitis B and C. will be obtained today as well. #3 urine culture pending

## 2013-10-21 LAB — URINE CULTURE

## 2013-10-21 LAB — GC/CHLAMYDIA PROBE AMP
CT Probe RNA: NEGATIVE
GC PROBE AMP APTIMA: NEGATIVE

## 2013-10-21 LAB — HEPATITIS B SURFACE ANTIGEN: HEP B S AG: NEGATIVE

## 2013-10-21 LAB — RPR

## 2013-10-21 LAB — CA 125: CA 125: 4.9 U/mL (ref 0.0–30.2)

## 2013-10-21 LAB — HEPATITIS C ANTIBODY: HCV Ab: NEGATIVE

## 2013-10-21 LAB — HIV ANTIBODY (ROUTINE TESTING W REFLEX): HIV 1&2 Ab, 4th Generation: NONREACTIVE

## 2013-11-01 ENCOUNTER — Other Ambulatory Visit: Payer: Self-pay | Admitting: Gynecology

## 2013-11-01 DIAGNOSIS — R8271 Bacteriuria: Secondary | ICD-10-CM

## 2013-11-16 ENCOUNTER — Ambulatory Visit: Payer: 59

## 2013-11-16 DIAGNOSIS — R8271 Bacteriuria: Secondary | ICD-10-CM

## 2013-11-17 LAB — URINALYSIS W MICROSCOPIC + REFLEX CULTURE
Bilirubin Urine: NEGATIVE
Casts: NONE SEEN
Crystals: NONE SEEN
Glucose, UA: NEGATIVE mg/dL
HGB URINE DIPSTICK: NEGATIVE
Ketones, ur: NEGATIVE mg/dL
Leukocytes, UA: NEGATIVE
NITRITE: NEGATIVE
PROTEIN: NEGATIVE mg/dL
Specific Gravity, Urine: 1.01 (ref 1.005–1.030)
Squamous Epithelial / LPF: NONE SEEN
Urobilinogen, UA: 0.2 mg/dL (ref 0.0–1.0)
pH: 7 (ref 5.0–8.0)

## 2013-11-18 LAB — URINE CULTURE

## 2013-11-30 ENCOUNTER — Telehealth: Payer: Self-pay

## 2013-11-30 NOTE — Telephone Encounter (Signed)
Patient informed. Patient said this was recheck from August urine culture that showed 40,000 multiple bacteria morphotypes present.  She has no symptoms.  She asked if she really needs to check it a 3rd time?  Also, patient said at August visit she was diagnosed with Trich infection. She was treated with Tindamax but she said her symptoms never resolved and the odor is very bothersome.  She said she is not currently sexually active and has not been since her visit. Rec?

## 2013-11-30 NOTE — Telephone Encounter (Signed)
Patient was advised and scheduled.

## 2013-11-30 NOTE — Telephone Encounter (Signed)
Patient called regarding 11/16/13 urine culture results.  "Colony Count  >=100,000 COLONIES/ML   Organism ID, Bacteria  Multiple bacterial morphotypes present, none   Organism ID, Bacteria  predominant. Suggest appropriate recollection if    Organism ID, Bacteria  clinically ".  Please advise what to tell her?

## 2013-11-30 NOTE — Telephone Encounter (Signed)
Sounds to me an office visit is warranted this week

## 2013-11-30 NOTE — Telephone Encounter (Signed)
Nonspecific mixed bacterial organisms found correction. She can come by and we can repeat the urinalysis and culture later this week

## 2013-12-05 ENCOUNTER — Ambulatory Visit: Payer: 59 | Admitting: Gynecology

## 2013-12-12 ENCOUNTER — Ambulatory Visit: Payer: 59 | Admitting: Gynecology

## 2013-12-13 ENCOUNTER — Ambulatory Visit: Payer: 59 | Admitting: Gynecology

## 2014-01-02 ENCOUNTER — Encounter: Payer: Self-pay | Admitting: Gynecology

## 2014-03-10 ENCOUNTER — Encounter: Payer: Self-pay | Admitting: Women's Health

## 2014-03-10 ENCOUNTER — Ambulatory Visit (INDEPENDENT_AMBULATORY_CARE_PROVIDER_SITE_OTHER): Payer: 59 | Admitting: Women's Health

## 2014-03-10 VITALS — BP 138/80 | Ht 59.0 in | Wt 196.0 lb

## 2014-03-10 DIAGNOSIS — N898 Other specified noninflammatory disorders of vagina: Secondary | ICD-10-CM

## 2014-03-10 DIAGNOSIS — R350 Frequency of micturition: Secondary | ICD-10-CM

## 2014-03-10 LAB — URINALYSIS W MICROSCOPIC + REFLEX CULTURE
BILIRUBIN URINE: NEGATIVE
Casts: NONE SEEN
Crystals: NONE SEEN
Glucose, UA: NEGATIVE mg/dL
KETONES UR: NEGATIVE mg/dL
Nitrite: NEGATIVE
Protein, ur: NEGATIVE mg/dL
SPECIFIC GRAVITY, URINE: 1.015 (ref 1.005–1.030)
Urobilinogen, UA: 0.2 mg/dL (ref 0.0–1.0)
pH: 6 (ref 5.0–8.0)

## 2014-03-10 LAB — WET PREP FOR TRICH, YEAST, CLUE
Clue Cells Wet Prep HPF POC: NONE SEEN
Trich, Wet Prep: NONE SEEN
YEAST WET PREP: NONE SEEN

## 2014-03-10 MED ORDER — NITROFURANTOIN MONOHYD MACRO 100 MG PO CAPS: 100.0000 mg | ORAL_CAPSULE | Freq: Two times a day (BID) | ORAL | Status: AC

## 2014-03-10 NOTE — Progress Notes (Signed)
Patient ID: [name], female   DOB: January 11, 1966, 49 y.o.   MRN: [id number] Presents with complaint of increased urinary frequency, urgency, pain at end of stream of urination for 4 days. Scant discharge minimal vaginal irritation, amenorrheic [name]. Not sexually active greater than 6 months.  Exam: Appears well. External genitalia within normal limits, speculum exam scant white discharge wet prep negative. Bimanual no CMT or adnexal fullness or tenderness. UA: Moderate blood, large leukocytes, TNTC WBCs, TNTC RBCs, many bacteria.  UTI  Plan: Macrobid twice daily for 7 days prescription, proper use, take with food reviewed. Instructed to call if no relief of symptoms. Urine culture pending.

## 2014-03-10 NOTE — Patient Instructions (Signed)

## 2014-03-11 LAB — URINE CULTURE: Colony Count: 40000

## 2014-03-13 ENCOUNTER — Other Ambulatory Visit: Payer: Self-pay | Admitting: Women's Health

## 2014-03-13 DIAGNOSIS — R3129 Other microscopic hematuria: Secondary | ICD-10-CM

## 2014-03-15 ENCOUNTER — Telehealth: Payer: Self-pay | Admitting: *Deleted

## 2014-03-15 NOTE — Telephone Encounter (Signed)
Pt was treated on OV 03/10/14 for UTI given Macrobid 100 mg x 7 days #14 2 days left on Rx and does not feel any better. Pt still have pain and frequency along with loose stools since on medication. Please advise

## 2014-03-16 NOTE — Telephone Encounter (Signed)
Message left

## 2014-03-17 ENCOUNTER — Ambulatory Visit (INDEPENDENT_AMBULATORY_CARE_PROVIDER_SITE_OTHER): Payer: 59

## 2014-03-17 ENCOUNTER — Ambulatory Visit (INDEPENDENT_AMBULATORY_CARE_PROVIDER_SITE_OTHER): Payer: 59 | Admitting: Gynecology

## 2014-03-17 ENCOUNTER — Other Ambulatory Visit: Payer: Self-pay | Admitting: Gynecology

## 2014-03-17 ENCOUNTER — Encounter: Payer: Self-pay | Admitting: Gynecology

## 2014-03-17 VITALS — BP 138/88

## 2014-03-17 DIAGNOSIS — N809 Endometriosis, unspecified: Secondary | ICD-10-CM

## 2014-03-17 DIAGNOSIS — N8320 Unspecified ovarian cysts: Secondary | ICD-10-CM

## 2014-03-17 DIAGNOSIS — D251 Intramural leiomyoma of uterus: Secondary | ICD-10-CM

## 2014-03-17 DIAGNOSIS — N83201 Unspecified ovarian cyst, right side: Secondary | ICD-10-CM

## 2014-03-17 DIAGNOSIS — R102 Pelvic and perineal pain: Secondary | ICD-10-CM

## 2014-03-17 DIAGNOSIS — N898 Other specified noninflammatory disorders of vagina: Secondary | ICD-10-CM

## 2014-03-17 DIAGNOSIS — R300 Dysuria: Secondary | ICD-10-CM

## 2014-03-17 LAB — CBC WITH DIFFERENTIAL/PLATELET
BASOS ABS: 0.1 10*3/uL (ref 0.0–0.1)
Basophils Relative: 1 % (ref 0–1)
Eosinophils Absolute: 0.2 10*3/uL (ref 0.0–0.7)
Eosinophils Relative: 2 % (ref 0–5)
HCT: 40.6 % (ref 36.0–46.0)
Hemoglobin: 13.2 g/dL (ref 12.0–15.0)
Lymphocytes Relative: 40 % (ref 12–46)
Lymphs Abs: 3.3 10*3/uL (ref 0.7–4.0)
MCH: 26.6 pg (ref 26.0–34.0)
MCHC: 32.5 g/dL (ref 30.0–36.0)
MCV: 81.9 fL (ref 78.0–100.0)
MPV: 9.5 fL (ref 8.6–12.4)
Monocytes Absolute: 0.5 10*3/uL (ref 0.1–1.0)
Monocytes Relative: 6 % (ref 3–12)
Neutro Abs: 4.2 10*3/uL (ref 1.7–7.7)
Neutrophils Relative %: 51 % (ref 43–77)
PLATELETS: 306 10*3/uL (ref 150–400)
RBC: 4.96 MIL/uL (ref 3.87–5.11)
RDW: 14 % (ref 11.5–15.5)
WBC: 8.3 10*3/uL (ref 4.0–10.5)

## 2014-03-17 LAB — URINALYSIS W MICROSCOPIC + REFLEX CULTURE
BILIRUBIN URINE: NEGATIVE
CASTS: NONE SEEN
Crystals: NONE SEEN
Glucose, UA: NEGATIVE mg/dL
Ketones, ur: NEGATIVE mg/dL
NITRITE: NEGATIVE
Specific Gravity, Urine: 1.015 (ref 1.005–1.030)
UROBILINOGEN UA: 0.2 mg/dL (ref 0.0–1.0)
pH: 6.5 (ref 5.0–8.0)

## 2014-03-17 LAB — WET PREP FOR TRICH, YEAST, CLUE
Clue Cells Wet Prep HPF POC: NONE SEEN
Trich, Wet Prep: NONE SEEN
Yeast Wet Prep HPF POC: NONE SEEN

## 2014-03-17 MED ORDER — METRONIDAZOLE 500 MG PO TABS: 500.0000 mg | ORAL_TABLET | Freq: Two times a day (BID) | ORAL | Status: DC

## 2014-03-17 MED ORDER — DOXYCYCLINE HYCLATE 100 MG PO CAPS: 100.0000 mg | ORAL_CAPSULE | Freq: Two times a day (BID) | ORAL | Status: DC

## 2014-03-17 MED ORDER — OXYCODONE-ACETAMINOPHEN 5-325 MG PO TABS: 1.0000 | ORAL_TABLET | ORAL | Status: DC | PRN

## 2014-03-17 NOTE — Progress Notes (Signed)
   Patient is a 49 year old presented to the office today complaining of low abdominal discomfort radiating to her back. Patient denied any true dysuria some frequency. She denied any fever, chills, nausea or vomiting. She was seen in the office last week for a suspected urinary tract infection based on urinalysis and our nurse practitioner had started her on Macrobid one by mouth twice a day for 7 days and when the cultures came back no microorganism was identified. Patient was having slight discharge today as well. Review of her record indicated she has been treated in the past for trichomoniasis. She had a negative STD screen in favor 2015. Patient had a [name] placed in January 2014. Patient has a history of a small right ovarian cyst and was previously scheduled for an ultrasound next month for follow-up. This was found incidentally when the ultrasound had been ordered since the IUD string was not visualized. She had a normal CA- 125. Exam: Back: No CVA tenderness Abdomen: Soft slightly tender on the right lower quadrant some guarding. Pelvic: Bartholin urethra Skene was within normal limits Vagina: Brown creamy-like discharge was noted. Wet prep was obtained. IUD string not visualized Cervix: As per above. Bimanual exam: Uterus anteverted normal size shape and consistency left adnexa nontender, tenderness in right adnexa was noted difficult to assess due to some guarding. Rectal exam: Not done  Wet prep: Moderate WBC, moderate bacteria   Urinalysis: WBC: too numerous to count RBC: 21-50 Bacteria: Many  Ultrasound today:  Uterus measured 8.5 x 5.2 x 3.6 cm with endometrial stripe of 4.4 mm. A single fibroid measuring 9 x 6 x 10 mm intramural was noted. The IUD was seen in the proper position in the endometrial cavity. Left and right adnexa normal. No free fluid in the cul-de-sac.   Assessment/plan: With apparent urinary tract infection and slightly tender lower abdomen. In the event  of underlying PID she will be placed on Vibramycin 100 mg twice a day for 7 days and Flagyl 500 mg twice a day for 5 days. Since she develops yeast infection after taking antibiotics and Diflucan 150 mg will be prescribed. Patient has not been sexually active since June 2015. A GC and Chlamydia culture was not done. If her symptoms do not improve she'll return back sooner are otherwise next month she is due for her annual exam and we will wait a result of the urine culture as well. For abdominal discomfort she was prescribed Percocet to take 1 by mouth every 4-6 hours when necessary.

## 2014-03-17 NOTE — Telephone Encounter (Signed)
Just saw her in hallway

## 2014-03-17 NOTE — Telephone Encounter (Signed)
Pt came in the office today to been seen regarding this.

## 2014-03-17 NOTE — Patient Instructions (Signed)
Bacterial Vaginosis Bacterial vaginosis is a vaginal infection that occurs when the normal balance of bacteria in the vagina is disrupted. It results from an overgrowth of certain bacteria. This is the most common vaginal infection in women of childbearing age. Treatment is important to prevent complications, especially in pregnant women, as it can cause a premature delivery. CAUSES  Bacterial vaginosis is caused by an increase in harmful bacteria that are normally present in smaller amounts in the vagina. Several different kinds of bacteria can cause bacterial vaginosis. However, the reason that the condition develops is not fully understood. RISK FACTORS Certain activities or behaviors can put you at an increased risk of developing bacterial vaginosis, including:  Having a new sex partner or multiple sex partners.  Douching.  Using an intrauterine device (IUD) for contraception. Women do not get bacterial vaginosis from toilet seats, bedding, swimming pools, or contact with objects around them. SIGNS AND SYMPTOMS  Some women with bacterial vaginosis have no signs or symptoms. Common symptoms include:  Grey vaginal discharge.  A fishlike odor with discharge, especially after sexual intercourse.  Itching or burning of the vagina and vulva.  Burning or pain with urination. DIAGNOSIS  Your health care provider will take a medical history and examine the vagina for signs of bacterial vaginosis. A sample of vaginal fluid may be taken. Your health care provider will look at this sample under a microscope to check for bacteria and abnormal cells. A vaginal pH test may also be done.  TREATMENT  Bacterial vaginosis may be treated with antibiotic medicines. These may be given in the form of a pill or a vaginal cream. A second round of antibiotics may be prescribed if the condition comes back after treatment.  HOME CARE INSTRUCTIONS   Only take over-the-counter or prescription medicines as  directed by your health care provider.  If antibiotic medicine was prescribed, take it as directed. Make sure you finish it even if you start to feel better.  Do not have sex until treatment is completed.  Tell all sexual partners that you have a vaginal infection. They should see their health care provider and be treated if they have problems, such as a mild rash or itching.  Practice safe sex by using condoms and only having one sex partner. SEEK MEDICAL CARE IF:   Your symptoms are not improving after 3 days of treatment.  You have increased discharge or pain.  You have a fever. MAKE SURE YOU:   Understand these instructions.  Will watch your condition.  Will get help right away if you are not doing well or get worse. FOR MORE INFORMATION  Centers for Disease Control and Prevention, Division of STD Prevention: www.cdc.gov/std American Sexual Health Association (ASHA): www.ashastd.org  Document Released: 02/17/2005 Document Revised: 12/08/2012 Document Reviewed: 09/29/2012 [employer] Patient Information 2015 [employer]. This information is not intended to replace advice given to you by your health care provider. Make sure you discuss any questions you have with your health care provider.  

## 2014-03-19 LAB — URINE CULTURE
COLONY COUNT: NO GROWTH
Organism ID, Bacteria: NO GROWTH

## 2014-04-17 ENCOUNTER — Other Ambulatory Visit: Payer: 59

## 2014-04-17 ENCOUNTER — Encounter: Payer: Self-pay | Admitting: Gynecology

## 2014-04-17 ENCOUNTER — Encounter: Payer: 59 | Admitting: Gynecology

## 2014-05-10 ENCOUNTER — Encounter: Payer: Self-pay | Admitting: Gynecology

## 2014-05-10 ENCOUNTER — Ambulatory Visit (INDEPENDENT_AMBULATORY_CARE_PROVIDER_SITE_OTHER): Payer: 59 | Admitting: Gynecology

## 2014-05-10 VITALS — BP 142/94 | Ht 58.25 in | Wt 194.0 lb

## 2014-05-10 DIAGNOSIS — Z01419 Encounter for gynecological examination (general) (routine) without abnormal findings: Secondary | ICD-10-CM | POA: Diagnosis not present

## 2014-05-10 DIAGNOSIS — I10 Essential (primary) hypertension: Secondary | ICD-10-CM | POA: Diagnosis not present

## 2014-05-10 DIAGNOSIS — A599 Trichomoniasis, unspecified: Secondary | ICD-10-CM

## 2014-05-10 DIAGNOSIS — N898 Other specified noninflammatory disorders of vagina: Secondary | ICD-10-CM

## 2014-05-10 LAB — WET PREP FOR TRICH, YEAST, CLUE: YEAST WET PREP: NONE SEEN

## 2014-05-10 MED ORDER — OLMESARTAN MEDOXOMIL-HCTZ 40-12.5 MG PO TABS: 1.0000 | ORAL_TABLET | Freq: Every day | ORAL | Status: DC

## 2014-05-10 MED ORDER — TINIDAZOLE 500 MG PO TABS: 500.0000 mg | ORAL_TABLET | Freq: Once | ORAL | Status: DC

## 2014-05-10 NOTE — Progress Notes (Signed)
[name] 08-12-1965 [id number]   History:    49 y.o.  for annual gyn exam under a lot of stress. She ran out of her hypertensive medication and is not scheduled to see her primary care physician Dr. Mariea Clonts until next week. Patient also is complaining of vaginal discharge. She has been treated for trichomoniasis in August 2015. At which time she had a negative STD screen to include GC and Chlamydia culture, HIV, RPR, hepatitis B and C. Patient states she still with the same partner. She has a [name] that has been in place for more than 5 years and needs to be changed. Patient with no previous history of any abnormal Pap smears. She did receive her flu vaccine this past year.  Past medical history,surgical history, family history and social history were all reviewed and documented in the EPIC chart.  Gynecologic History No LMP recorded. Patient is not currently having periods (Reason: IUD). Contraception: IUD Last Pap: 2015. Results were: normal Last mammogram: 2015. Results were: Dense but normal  Obstetric History OB History  Gravida Para Term Preterm AB SAB TAB Ectopic Multiple Living  2 1 1  1     1     # Outcome Date GA Lbr Len/2nd Weight Sex Delivery Anes PTL Lv  2 AB           1 Term                ROS: A ROS was performed and pertinent positives and negatives are included in the history.  GENERAL: No fevers or chills. HEENT: No change in vision, no earache, sore throat or sinus congestion. NECK: No pain or stiffness. CARDIOVASCULAR: No chest pain or pressure. No palpitations. PULMONARY: No shortness of breath, cough or wheeze. GASTROINTESTINAL: No abdominal pain, nausea, vomiting or diarrhea, melena or bright red blood per rectum. GENITOURINARY: No urinary frequency, urgency, hesitancy or dysuria. MUSCULOSKELETAL: No joint or muscle pain, no back pain, no recent trauma. DERMATOLOGIC: No rash, no itching, no lesions. ENDOCRINE: No polyuria, polydipsia, no heat or cold  intolerance. No recent change in weight. HEMATOLOGICAL: No anemia or easy bruising or bleeding. NEUROLOGIC: No headache, seizures, numbness, tingling or weakness. PSYCHIATRIC: No depression, no loss of interest in normal activity or change in sleep pattern.     Exam: chaperone present  BP 142/94 mmHg  Ht 4' 10.25" (1.48 m)  Wt 194 lb (87.998 kg)  BMI 40.17 kg/m2  Body mass index is 40.17 kg/(m^2).  General appearance : Well developed well nourished female. No acute distress HEENT: Eyes: no retinal hemorrhage or exudates,  Neck supple, trachea midline, no carotid bruits, no thyroidmegaly Lungs: Clear to auscultation, no rhonchi or wheezes, or rib retractions  Heart: Regular rate and rhythm, no murmurs or gallops Breast:Examined in sitting and supine position were symmetrical in appearance, no palpable masses or tenderness,  no skin retraction, no nipple inversion, no nipple discharge, no skin discoloration, no axillary or supraclavicular lymphadenopathy Abdomen: no palpable masses or tenderness, no rebound or guarding Extremities: no edema or skin discoloration or tenderness  Pelvic:  Bartholin, Urethra, Skene Glands: Within normal limits             Vagina: No gross lesions clear discharge  Cervix: No gross lesions or discharge IUD string not seen Uterus  anteverted, normal size, shape and consistency, non-tender and mobile  Adnexa  Without masses or tenderness  Anus and perineum  normal   Rectovaginal  normal sphincter tone without  palpated masses or tenderness             Hemoccult not indicated   Wet prep: Positive amine, moderate trichomonads, many clue cells, too numerous to count white blood cell, too numerous to count bacteria  Assessment/Plan:  49 y.o. female for annual exam with clinical evidence of trichomoniasis. She'll be treated with Tindamax 500 mg she is to take 4 times today and repeat in 24 hours. A GC and Chlamydia culture was obtained today. She was reminded to  make sure that her partner gets treated. She will return back next week to change her [name] this overdue. I have given her a prescription for 30 days supply of her Benicar HCT for hypertension until she sees her PCP next week. She was reminded on the importance of monthly breast exam. When she has her mammogram this year she is to request a three-dimensional mammogram due to the fact that her last study demonstrated her breasts were dense. We also discussed importance of calcium vitamin D and regular exercise for osteoporosis prevention. We also discussed until the IUD is changed at meanwhile she use condoms during intercourse but first make sure that her partner gets treated for trichomoniasis.   Terrance Mass MD, 10:48 AM 05/10/2014

## 2014-05-10 NOTE — Patient Instructions (Signed)
Trichomoniasis Trichomoniasis is an infection caused by an organism called Trichomonas. The infection can affect both women and men. In women, the outer female genitalia and the vagina are affected. In men, the penis is mainly affected, but the prostate and other reproductive organs can also be involved. Trichomoniasis is a sexually transmitted infection (STI) and is most often passed to another person through sexual contact.  RISK FACTORS  Having unprotected sexual intercourse.  Having sexual intercourse with an infected partner. SIGNS AND SYMPTOMS  Symptoms of trichomoniasis in women include:  Abnormal gray-green frothy vaginal discharge.  Itching and irritation of the vagina.  Itching and irritation of the area outside the vagina. Symptoms of trichomoniasis in men include:   Penile discharge with or without pain.  Pain during urination. This results from inflammation of the urethra. DIAGNOSIS  Trichomoniasis may be found during a Pap test or physical exam. Your health care provider may use one of the following methods to help diagnose this infection:  Examining vaginal discharge under a microscope. For men, urethral discharge would be examined.  Testing the pH of the vagina with a test tape.  Using a vaginal swab test that checks for the Trichomonas organism. A test is available that provides results within a few minutes.  Doing a culture test for the organism. This is not usually needed. TREATMENT   You may be given medicine to fight the infection. Women should inform their health care provider if they could be or are pregnant. Some medicines used to treat the infection should not be taken during pregnancy.  Your health care provider may recommend over-the-counter medicines or creams to decrease itching or irritation.  Your sexual partner will need to be treated if infected. HOME CARE INSTRUCTIONS   Take medicines only as directed by your health care provider.  Take  over-the-counter medicine for itching or irritation as directed by your health care provider.  Do not have sexual intercourse while you have the infection.  Women should not douche or wear tampons while they have the infection.  Discuss your infection with your partner. Your partner may have gotten the infection from you, or you may have gotten it from your partner.  Have your sex partner get examined and treated if necessary.  Practice safe, informed, and protected sex.  See your health care provider for other STI testing. SEEK MEDICAL CARE IF:   You still have symptoms after you finish your medicine.  You develop abdominal pain.  You have pain when you urinate.  You have bleeding after sexual intercourse.  You develop a rash.  Your medicine makes you sick or makes you throw up (vomit). MAKE SURE YOU:  Understand these instructions.  Will watch your condition.  Will get help right away if you are not doing well or get worse. Document Released: 08/13/2000 Document Revised: 07/04/2013 Document Reviewed: 11/29/2012 [facility] Patient Information 2015 [place], [place]. This information is not intended to replace advice given to you by your health care provider. Make sure you discuss any questions you have with your health care provider. Tinidazole tablets What is this medicine? TINIDAZOLE (tye NI da zole) is an antiinfective. It is used to treat amebiasis, giardiasis, trichomoniasis, and vaginosis. It will not work for colds, flu, or other viral infections. This medicine may be used for other purposes; ask your health care provider or pharmacist if you have questions. COMMON BRAND NAME(S): Tindamax What should I tell my health care provider before I take this medicine? They need to  know if you have any of these conditions: -anemia or other blood disorders -if you frequently drink alcohol containing drinks -receiving hemodialysis -seizure disorder -an unusual or allergic  reaction to tinidazole, other medicines, foods, dyes, or preservatives -pregnant or trying to get pregnant -breast-feeding How should I use this medicine? Take this medicine by mouth with a full glass of water. Follow the directions on the prescription label. Take with food. Take your medicine at regular intervals. Do not take your medicine more often than directed. Take all of your medicine as directed even if you think you are better. Do not skip doses or stop your medicine early. Talk to your pediatrician regarding the use of this medicine in children. While this drug may be prescribed for children as young as 34 years of age for selected conditions, precautions do apply. Overdosage: If you think you have taken too much of this medicine contact a poison control center or emergency room at once. NOTE: This medicine is only for you. Do not share this medicine with others. What if I miss a dose? If you miss a dose, take it as soon as you can. If it is almost time for your next dose, take only that dose. Do not take double or extra doses. What may interact with this medicine? Do not take this medicine with any of the following medications: -alcohol or any product that contains alcohol -amprenavir oral solution -disulfiram -paclitaxel injection -ritonavir oral solution -sertraline oral solution -sulfamethoxazole-trimethoprim injection This medicine may also interact with the following medications: -cholestyramine -cimetidine -conivaptan -cyclosporin -fluorouracil -fosphenytoin, phenytoin -ketoconazole -lithium -phenobarbital -tacrolimus -warfarin This list may not describe all possible interactions. Give your health care provider a list of all the medicines, herbs, non-prescription drugs, or dietary supplements you use. Also tell them if you smoke, drink alcohol, or use illegal drugs. Some items may interact with your medicine. What should I watch for while using this medicine? Tell  your doctor or health care professional if your symptoms do not improve or if they get worse. Avoid alcoholic drinks while you are taking this medicine and for three days afterward. Alcohol may make you feel dizzy, sick, or flushed. If you are being treated for a sexually transmitted disease, avoid sexual contact until you have finished your treatment. Your sexual partner may also need treatment. What side effects may I notice from receiving this medicine? Side effects that you should report to your doctor or health care professional as soon as possible: -allergic reactions like skin rash, itching or hives, swelling of the face, lips, or tongue -breathing problems -confusion, depression -dark or white patches in the mouth -feeling faint or lightheaded, falls -fever, infection -numbness, tingling, pain or weakness in the hands or feet -pain when passing urine -seizures -unusually weak or tired -vaginal irritation or discharge -vomiting Side effects that usually do not require medical attention (report to your doctor or health care professional if they continue or are bothersome): -dark brown or reddish urine -diarrhea -headache -loss of appetite -metallic taste -nausea -stomach upset This list may not describe all possible side effects. Call your doctor for medical advice about side effects. You may report side effects to FDA at 1-800-FDA-1088. Where should I keep my medicine? Keep out of the reach of children. Store at room temperature between 15 and 30 degrees C (59 and 86 degrees F). Protect from light and moisture. Keep container tightly closed. Throw away any unused medicine after the expiration date. NOTE: This sheet is a summary.  It may not cover all possible information. If you have questions about this medicine, talk to your doctor, pharmacist, or health care provider.  2015, Elsevier/[employer] Standard. (2007-11-15 15:22:28)

## 2014-05-11 LAB — GC/CHLAMYDIA PROBE AMP
CT PROBE, AMP APTIMA: NEGATIVE
GC Probe RNA: NEGATIVE

## 2014-05-15 ENCOUNTER — Other Ambulatory Visit: Payer: Self-pay | Admitting: Gynecology

## 2014-05-15 ENCOUNTER — Telehealth: Payer: Self-pay | Admitting: Gynecology

## 2014-05-15 MED ORDER — LEVONORGESTREL 20 MCG/24HR IU IUD: INTRAUTERINE_SYSTEM | Freq: Once | INTRAUTERINE | Status: DC

## 2014-05-15 NOTE — Telephone Encounter (Signed)
05/15/14-I LM VM for pt today that her [place] ins will cover the removal of existing Mirena and insertion of new for contraception at 100%. Pt already has appt set with JF for 05/16/14/wl

## 2014-05-16 ENCOUNTER — Ambulatory Visit (INDEPENDENT_AMBULATORY_CARE_PROVIDER_SITE_OTHER): Payer: 59 | Admitting: Gynecology

## 2014-05-16 ENCOUNTER — Encounter: Payer: Self-pay | Admitting: Gynecology

## 2014-05-16 VITALS — BP 136/92

## 2014-05-16 DIAGNOSIS — Z30433 Encounter for removal and reinsertion of intrauterine contraceptive device: Secondary | ICD-10-CM | POA: Insufficient documentation

## 2014-05-16 DIAGNOSIS — Z8742 Personal history of other diseases of the female genital tract: Secondary | ICD-10-CM

## 2014-05-16 DIAGNOSIS — Z8619 Personal history of other infectious and parasitic diseases: Secondary | ICD-10-CM

## 2014-05-16 DIAGNOSIS — Z975 Presence of (intrauterine) contraceptive device: Secondary | ICD-10-CM | POA: Insufficient documentation

## 2014-05-16 LAB — WET PREP FOR TRICH, YEAST, CLUE
CLUE CELLS WET PREP: NONE SEEN
TRICH WET PREP: NONE SEEN
YEAST WET PREP: NONE SEEN

## 2014-05-16 NOTE — Patient Instructions (Signed)

## 2014-05-16 NOTE — Progress Notes (Addendum)
   Patient presented to the office today to remove her outdated [name] and replace it with a new one. Patient was seen in the office on March 9 for her annual exam and had been complaining of vaginal discharge and a wet prep demonstrated trichomoniasis. She was started on Tindamax 500 mg 4 tablets a day for 2 days. She also had a negative GC and Chlamydia culture. She was advised her partner needs to be treated which she has told him. She also was offered a full STD screening to include HIV, RPR, hepatitis B and C she would like to wait to month. She did have a full STD screen 6 months ago was negative.  Exam: Abdomen: Soft nontender no rebound or guarding Pelvic: Bartholin urethra Skene was within normal limits Vagina: No lesions or discharge Cervix: No lesions or discharge Wet prep was done which was negative Bimanual exam: Uterus anteverted normal size shape and consistency Adnexa: No palpable masses or Rectal exam: Not done  The cervix was cleansed with Betadine solution and a small metal was placed into the uterine cavity and the IUD was retrieved shown to the patient discarded.                                                                    IUD procedure note       Patient presented to the office today for placement of [name]. The patient had previously been provided with literature information on this method of contraception. The risks benefits and pros and cons were discussed and all her questions were answered. She is fully aware that this form of contraception is 99% effective and is good for 5 years.  The cervix was cleansed with Betadine solution. A single-tooth tenaculum was placed on the anterior cervical lip. The uterus sounded to 7-1/2 centimeter. The IUD was shown to the patient and inserted in a sterile fashion. The IUD string was trimmed. The single-tooth tenaculum was removed. Patient was instructed to return back to the office in one month for follow up.     Patient  had experienced some queasiness and some cramping so she was given Toradol 30 mg IM and Zofran ODT 8 mg sublingual she was watched for about 30 minutes with stable vital signs she was given some the drink and to eat and released home.

## 2014-05-17 ENCOUNTER — Encounter: Payer: Self-pay | Admitting: Gynecology

## 2014-06-05 ENCOUNTER — Other Ambulatory Visit: Payer: Self-pay | Admitting: Gynecology

## 2014-06-05 NOTE — Telephone Encounter (Signed)
Was pt still going to take HRT, didn't see anything about it in annual note?

## 2014-06-09 ENCOUNTER — Telehealth: Payer: Self-pay | Admitting: *Deleted

## 2014-06-09 MED ORDER — ESTRADIOL 1 MG PO TABS: 1.0000 mg | ORAL_TABLET | Freq: Every day | ORAL | Status: DC

## 2014-06-09 NOTE — Telephone Encounter (Signed)
Pt called due to Estrace 1mg  prescription being denied.  The prescription was denied for reason of not being prescribed medication.  Pt was started in 03/2011 by Dr Cherylann Banas due to menopausal symptoms and an elevated FSH. Pt states has continued to take it to keep symptoms down. [place] for refill?  KW CMA

## 2014-06-09 NOTE — Telephone Encounter (Signed)
Per JF verbally okay to prescribe Estrace 1mg  for a year. KW CMA

## 2014-06-09 NOTE — Telephone Encounter (Signed)
RX sent

## 2014-06-13 ENCOUNTER — Encounter: Payer: Self-pay | Admitting: Gynecology

## 2014-06-13 ENCOUNTER — Ambulatory Visit (INDEPENDENT_AMBULATORY_CARE_PROVIDER_SITE_OTHER): Payer: 59 | Admitting: Gynecology

## 2014-06-13 VITALS — BP 130/84

## 2014-06-13 DIAGNOSIS — Z30431 Encounter for routine checking of intrauterine contraceptive device: Secondary | ICD-10-CM | POA: Diagnosis not present

## 2014-06-13 NOTE — Progress Notes (Signed)
   Patient presented to the office today for her 1 month follow-up after having replaced her outdated [name]. On March 9 of this year patient was treated for trichomoniasis with Tindamax. She broke up with her boyfriend. She had a negative GC and Chlamydia culture. She was advised her partner needs to be treated which she has told him. She also was offered a full STD screening to include HIV, RPR, hepatitis B and C which was negative. Her follow-up with prep was negative. Patient is done well with a [name]. Patient recently today.  Exam: Abdomen soft nontender no rebound or guarding Pelvic: Bartholin urethra Skene was within normal limits Vagina: No lesions or discharge Cervix: No lesions or discharge IUD string was visualized Uterus: Anteverted normal size shape and consistency Adnexa: No palpable mass or tenderness Rectal exam not done  Assessment/plan: Patient status post one month placement of [name] doing well. Patient treated in March of this year for trichomoniasis follow-up test of cure was negative. Patient no longer seeing her partner although she had informed him to be treated. Patient scheduled to return back next year for annual exam or when necessary.

## 2014-08-01 ENCOUNTER — Other Ambulatory Visit: Payer: Self-pay

## 2014-08-01 DIAGNOSIS — Z1231 Encounter for screening mammogram for malignant neoplasm of breast: Secondary | ICD-10-CM

## 2014-09-12 ENCOUNTER — Ambulatory Visit: Payer: Self-pay

## 2014-10-12 ENCOUNTER — Ambulatory Visit: Payer: 59

## 2014-10-31 ENCOUNTER — Ambulatory Visit (INDEPENDENT_AMBULATORY_CARE_PROVIDER_SITE_OTHER): Payer: 59 | Admitting: Gynecology

## 2014-10-31 ENCOUNTER — Ambulatory Visit (INDEPENDENT_AMBULATORY_CARE_PROVIDER_SITE_OTHER): Payer: 59

## 2014-10-31 ENCOUNTER — Encounter: Payer: Self-pay | Admitting: Gynecology

## 2014-10-31 ENCOUNTER — Other Ambulatory Visit: Payer: Self-pay | Admitting: Gynecology

## 2014-10-31 VITALS — BP 142/90

## 2014-10-31 DIAGNOSIS — B9689 Other specified bacterial agents as the cause of diseases classified elsewhere: Secondary | ICD-10-CM

## 2014-10-31 DIAGNOSIS — N760 Acute vaginitis: Secondary | ICD-10-CM | POA: Diagnosis not present

## 2014-10-31 DIAGNOSIS — N898 Other specified noninflammatory disorders of vagina: Secondary | ICD-10-CM

## 2014-10-31 DIAGNOSIS — N8320 Unspecified ovarian cysts: Secondary | ICD-10-CM

## 2014-10-31 DIAGNOSIS — R1032 Left lower quadrant pain: Secondary | ICD-10-CM

## 2014-10-31 DIAGNOSIS — A499 Bacterial infection, unspecified: Secondary | ICD-10-CM

## 2014-10-31 DIAGNOSIS — Z113 Encounter for screening for infections with a predominantly sexual mode of transmission: Secondary | ICD-10-CM | POA: Diagnosis not present

## 2014-10-31 DIAGNOSIS — N83201 Unspecified ovarian cyst, right side: Secondary | ICD-10-CM

## 2014-10-31 LAB — CBC WITH DIFFERENTIAL/PLATELET
BASOS PCT: 0 % (ref 0–1)
Basophils Absolute: 0 10*3/uL (ref 0.0–0.1)
EOS ABS: 0.1 10*3/uL (ref 0.0–0.7)
Eosinophils Relative: 1 % (ref 0–5)
HCT: 41.1 % (ref 36.0–46.0)
HEMOGLOBIN: 13.6 g/dL (ref 12.0–15.0)
Lymphocytes Relative: 45 % (ref 12–46)
Lymphs Abs: 4.2 10*3/uL — ABNORMAL HIGH (ref 0.7–4.0)
MCH: 27 pg (ref 26.0–34.0)
MCHC: 33.1 g/dL (ref 30.0–36.0)
MCV: 81.5 fL (ref 78.0–100.0)
MONO ABS: 0.6 10*3/uL (ref 0.1–1.0)
MONOS PCT: 6 % (ref 3–12)
MPV: 9.6 fL (ref 8.6–12.4)
Neutro Abs: 4.5 10*3/uL (ref 1.7–7.7)
Neutrophils Relative %: 48 % (ref 43–77)
PLATELETS: 312 10*3/uL (ref 150–400)
RBC: 5.04 MIL/uL (ref 3.87–5.11)
RDW: 14.1 % (ref 11.5–15.5)
WBC: 9.3 10*3/uL (ref 4.0–10.5)

## 2014-10-31 LAB — WET PREP FOR TRICH, YEAST, CLUE
Clue Cells Wet Prep HPF POC: NONE SEEN
Trich, Wet Prep: NONE SEEN
YEAST WET PREP: NONE SEEN

## 2014-10-31 MED ORDER — KETOROLAC TROMETHAMINE 30 MG/ML IJ SOLN
30.0000 mg | Freq: Once | INTRAMUSCULAR | Status: AC
  Administered 2014-10-31: 30 mg via INTRAMUSCULAR

## 2014-10-31 MED ORDER — METRONIDAZOLE 500 MG PO TABS: 500.0000 mg | ORAL_TABLET | Freq: Two times a day (BID) | ORAL | Status: DC

## 2014-10-31 MED ORDER — KETOROLAC TROMETHAMINE 10 MG PO TABS: 10.0000 mg | ORAL_TABLET | Freq: Four times a day (QID) | ORAL | Status: DC | PRN

## 2014-10-31 MED ORDER — CIPROFLOXACIN HCL 250 MG PO TABS: 250.0000 mg | ORAL_TABLET | Freq: Two times a day (BID) | ORAL | Status: DC

## 2014-10-31 MED ORDER — FLUCONAZOLE 150 MG PO TABS: 150.0000 mg | ORAL_TABLET | Freq: Once | ORAL | Status: DC

## 2014-10-31 NOTE — Progress Notes (Signed)
   Patient is a 49 year old who presented to the office today complaining of several days of on and off left lower quadrant discomfort. She reports no problems with urination or bowel movements. She denies any fever, chills, nausea, or vomiting. Patient was last seen in the office on 06/13/2014 for a 1 month follow-up after having changed her IUD for a new Mirena. Patient has had some spotting at times as a result of the [name]. She stated her last sexual contact was back in June and that she use condoms. Review of her record indicated that in March of this year she was treated for trichomoniasis but has not had the full STD screen as had been recommended at that time because she felt she had had one less than 6 months prior to that event. Patient denied any GU or GI complaints. No fever, chills, nausea, vomiting.  Exam: Blood pressure 142/90 Gen. appearance patient complaining of left lower quadrant discomfort Abdomen: Some tenderness noted in the left lower quadrant but no rebound or guarding Back: No CVA tenderness Pelvic: Bartholin urethra Skene glands within normal limits Vagina: No lesions or discharge Cervix: No lesions or discharge IUD string not visualized Uterus: Anteverted normal size shape and consistency Right adnexa no palpable masses or tenderness Some tenderness elicited in the left lower quadrant with some guarding. Rectal exam not done  Wet prep few white blood cells and moderate bacteria  Ultrasound today: Uterus measured 8.0 x 4.6 x 4.0 cm endometrial stripe 1.3 mm. A small intramural fibroid measured 10 x 7 mm was noted. IUD was seen in the normal position. A right wall thinned echo-free cyst measuring 2.0 x 2.4 x 2.2 cm internal low level echoes negative color flow. Left adnexa absent. No fluid in the cul-de-sac.  Assessment/plan: Patient with on and off left lower quadrant pain of the past few days. Patient with past history of left salpingo-oophorectomy. Patient with  [name] for cycle control and for contraception. Full STD screening done today to include: GC and Chlamydia culture, HIV, RPR, hepatitis B and C. If the event of a possible diverticulitis patient will be started on Cipro 250 mg one by mouth twice a day for 7 days along with Flagyl 500 mg one by mouth twice a day for 7 days. Instructions were provided. She would develop any worsening of symptoms or temperature elevation she should report to the office or to the emergency room after hours. She was given a prescription Diflucan the event she develops a yeast infection. She received Toradol 30 mg IM today and a prescription for 10 mg to take 1 by mouth every 6 hours when necessary pain was provided. If system persists after the above she will contact the office and we'll refer her to the gastroenterologist.

## 2014-10-31 NOTE — Patient Instructions (Signed)

## 2014-11-01 LAB — GC/CHLAMYDIA PROBE AMP
CT Probe RNA: NEGATIVE
GC Probe RNA: NEGATIVE

## 2014-11-01 LAB — HEPATITIS B SURFACE ANTIGEN: HEP B S AG: NEGATIVE

## 2014-11-01 LAB — HIV ANTIBODY (ROUTINE TESTING W REFLEX): HIV 1&2 Ab, 4th Generation: NONREACTIVE

## 2014-11-01 LAB — RPR

## 2014-11-01 LAB — HEPATITIS C ANTIBODY: HCV AB: NEGATIVE

## 2014-11-03 ENCOUNTER — Ambulatory Visit: Payer: 59

## 2014-11-29 ENCOUNTER — Telehealth: Payer: Self-pay | Admitting: *Deleted

## 2014-11-29 MED ORDER — METRONIDAZOLE 0.75 % VA GEL: 1.0000 | Freq: Every day | VAGINAL | Status: DC

## 2014-11-29 NOTE — Telephone Encounter (Signed)
MetroGel will be fine

## 2014-11-29 NOTE — Telephone Encounter (Signed)
Pt is allergic to cleocin vaginal cream asked if she can take metrogel vaginal cream?

## 2014-11-29 NOTE — Telephone Encounter (Signed)
Pt was treated with BV infection on OV 10/31/14 Flagyl 500 mg one by mouth twice a day for 7 days, completed Rx, states odor never went away, pt didn't call sooner assumed it would go away. Not sexually active, asked if you could please give Rx for this, no itching, odor only. Pt asked for cream if possible, Ov offered. Please advise

## 2014-11-29 NOTE — Telephone Encounter (Signed)
Rx sent pt aware 

## 2014-11-29 NOTE — Telephone Encounter (Signed)
Please call in Cleocin vaginal cream to apply twice a day for 5-7 days. If symptoms persist she will need to be seen the office

## 2015-02-20 ENCOUNTER — Ambulatory Visit
Admission: RE | Admit: 2015-02-20 | Discharge: 2015-02-20 | Disposition: A | Discharge status: Home / Self Care | Payer: 59 | Source: Ambulatory Visit

## 2015-02-20 DIAGNOSIS — Z1231 Encounter for screening mammogram for malignant neoplasm of breast: Secondary | ICD-10-CM

## 2015-05-22 ENCOUNTER — Encounter: Payer: Self-pay | Admitting: Women's Health

## 2015-05-22 ENCOUNTER — Encounter: Payer: 59 | Admitting: Gynecology

## 2015-05-22 ENCOUNTER — Ambulatory Visit (INDEPENDENT_AMBULATORY_CARE_PROVIDER_SITE_OTHER): Payer: 59 | Admitting: Women's Health

## 2015-05-22 VITALS — BP 132/90 | Ht 58.75 in | Wt 199.0 lb

## 2015-05-22 DIAGNOSIS — N9489 Other specified conditions associated with female genital organs and menstrual cycle: Secondary | ICD-10-CM | POA: Diagnosis not present

## 2015-05-22 DIAGNOSIS — N898 Other specified noninflammatory disorders of vagina: Secondary | ICD-10-CM

## 2015-05-22 LAB — WET PREP FOR TRICH, YEAST, CLUE
CLUE CELLS WET PREP: NONE SEEN
Trich, Wet Prep: NONE SEEN
Yeast Wet Prep HPF POC: NONE SEEN

## 2015-05-22 NOTE — Progress Notes (Signed)
Patient ID: [name], female   DOB: 11/27/1965, 50 y.o.   MRN: [id number] Presents with complaint of vaginal odor and pruritus for the past 4 months. Denies discharge, pain, urinary symptoms, or abnormal vaginal bleeding. [name]/occassional spotting/ no new partners since STD screen 10/2014. Treated for BV in 10/2014 with Flagyl 500mg  BID x7 days followed by MetroGel, which resolved her odor and itching. History of BV.  Exam: Appears well. Normal external genitalia. Speculum exam: minimal discharge, no erythema. No odor noted. IUD strings visible. Wet prep negative.  Vaginal Odor  History BV [name]  Plan: Rx for Boric Acid 600 gel caps per vagina twice weekly, with instructions for use given. Aware to call if no relief of symptoms, will try Metrogel if symptoms persist.

## 2015-05-23 LAB — HM DIABETES EYE EXAM

## 2015-07-09 ENCOUNTER — Encounter: Payer: 59 | Admitting: Gynecology

## 2015-07-12 ENCOUNTER — Other Ambulatory Visit: Payer: Self-pay

## 2015-07-12 MED ORDER — ESTRADIOL 1 MG PO TABS: 1.0000 mg | ORAL_TABLET | Freq: Every day | ORAL | Status: DC

## 2015-08-10 ENCOUNTER — Ambulatory Visit (INDEPENDENT_AMBULATORY_CARE_PROVIDER_SITE_OTHER): Payer: 59 | Admitting: Gynecology

## 2015-08-10 ENCOUNTER — Encounter: Payer: Self-pay | Admitting: Gynecology

## 2015-08-10 VITALS — BP 134/88 | Ht 58.75 in | Wt 195.8 lb

## 2015-08-10 DIAGNOSIS — Z01419 Encounter for gynecological examination (general) (routine) without abnormal findings: Secondary | ICD-10-CM | POA: Diagnosis not present

## 2015-08-10 DIAGNOSIS — N898 Other specified noninflammatory disorders of vagina: Secondary | ICD-10-CM

## 2015-08-10 DIAGNOSIS — N9489 Other specified conditions associated with female genital organs and menstrual cycle: Secondary | ICD-10-CM

## 2015-08-10 LAB — WET PREP FOR TRICH, YEAST, CLUE
Clue Cells Wet Prep HPF POC: NONE SEEN
Trich, Wet Prep: NONE SEEN
Yeast Wet Prep HPF POC: NONE SEEN

## 2015-08-10 NOTE — Progress Notes (Signed)
[name] [birth year] [id number]   History:    50 y.o.  for annual gyn exam with a complaint of occasional vaginal odor. She denies any vaginal discharge. She has not been sexually active in over year. She had a [name] changed last year and is having no menstrual cycle. She was treated in 2015 for trichomoniasis and had a full negative STD screen. She states that she still been with the same partner has not been sexually active since last year. Her PCP is Dr. Joneen Caraway who is been doing her yearly blood work. Patient with no previous history of any abnormal Pap smears.  Past medical history,surgical history, family history and social history were all reviewed and documented in the EPIC chart.  Gynecologic History No LMP recorded. Patient is not currently having periods (Reason: IUD). Contraception: IUD Last Pap: 2015. Results were: normal Last mammogram: 2016. Results were: normal  Obstetric History OB History  Gravida Para Term Preterm AB SAB TAB Ectopic Multiple Living  2 1 1  1     1     # Outcome Date GA Lbr Len/2nd Weight Sex Delivery Anes PTL Lv  2 AB           1 Term                ROS: A ROS was performed and pertinent positives and negatives are included in the history.  GENERAL: No fevers or chills. HEENT: No change in vision, no earache, sore throat or sinus congestion. NECK: No pain or stiffness. CARDIOVASCULAR: No chest pain or pressure. No palpitations. PULMONARY: No shortness of breath, cough or wheeze. GASTROINTESTINAL: No abdominal pain, nausea, vomiting or diarrhea, melena or bright red blood per rectum. GENITOURINARY: No urinary frequency, urgency, hesitancy or dysuria. MUSCULOSKELETAL: No joint or muscle pain, no back pain, no recent trauma. DERMATOLOGIC: No rash, no itching, no lesions. ENDOCRINE: No polyuria, polydipsia, no heat or cold intolerance. No recent change in weight. HEMATOLOGICAL: No anemia or easy bruising or bleeding. NEUROLOGIC: No headache,  seizures, numbness, tingling or weakness. PSYCHIATRIC: No depression, no loss of interest in normal activity or change in sleep pattern.     Exam: chaperone present  BP 134/88 mmHg  Ht 4' 10.75" (1.492 m)  Wt 195 lb 12.8 oz (88.814 kg)  BMI 39.90 kg/m2  Body mass index is 39.9 kg/(m^2).  General appearance : Well developed well nourished female. No acute distress HEENT: Eyes: no retinal hemorrhage or exudates,  Neck supple, trachea midline, no carotid bruits, no thyroidmegaly Lungs: Clear to auscultation, no rhonchi or wheezes, or rib retractions  Heart: Regular rate and rhythm, no murmurs or gallops Breast:Examined in sitting and supine position were symmetrical in appearance, no palpable masses or tenderness,  no skin retraction, no nipple inversion, no nipple discharge, no skin discoloration, no axillary or supraclavicular lymphadenopathy Abdomen: no palpable masses or tenderness, no rebound or guarding Extremities: no edema or skin discoloration or tenderness  Pelvic:  Bartholin, Urethra, Skene Glands: Within normal limits             Vagina: No gross lesions or discharge  Cervix: No gross lesions or discharge  Uterus  anteverted, normal size, shape and consistency, non-tender and mobile  Adnexa  Without masses or tenderness  Anus and perineum  normal   Rectovaginal  normal sphincter tone without palpated masses or tenderness             Hemoccult not indicated   Wet prep negative  Assessment/Plan:  50 y.o. female for annual exam with on and off at times vaginal odor. I have recommended she use Replens as a vaginal moisturizer once or twice a week or refresh a probiotic gel twice a week. Patient will not need a Pap smear this year. She was encouraged to do her monthly breast exam. Patient due for mammogram December this year.   Terrance Mass MD, 11:02 AM 08/10/2015

## 2016-02-28 ENCOUNTER — Other Ambulatory Visit: Payer: Self-pay | Admitting: Gynecology

## 2016-02-28 DIAGNOSIS — Z1231 Encounter for screening mammogram for malignant neoplasm of breast: Secondary | ICD-10-CM

## 2016-02-29 ENCOUNTER — Ambulatory Visit
Admission: RE | Admit: 2016-02-29 | Discharge: 2016-02-29 | Disposition: A | Discharge status: Home / Self Care | Payer: 59 | Source: Ambulatory Visit | Attending: Gynecology | Admitting: Gynecology

## 2016-02-29 DIAGNOSIS — Z1231 Encounter for screening mammogram for malignant neoplasm of breast: Secondary | ICD-10-CM

## 2016-03-12 ENCOUNTER — Telehealth: Payer: Self-pay | Admitting: Neurology

## 2016-03-17 DIAGNOSIS — N390 Urinary tract infection, site not specified: Secondary | ICD-10-CM | POA: Diagnosis not present

## 2016-03-17 DIAGNOSIS — I10 Essential (primary) hypertension: Secondary | ICD-10-CM | POA: Diagnosis not present

## 2016-03-17 DIAGNOSIS — E119 Type 2 diabetes mellitus without complications: Secondary | ICD-10-CM | POA: Diagnosis not present

## 2016-03-17 DIAGNOSIS — E7800 Pure hypercholesterolemia, unspecified: Secondary | ICD-10-CM | POA: Diagnosis not present

## 2016-05-02 DIAGNOSIS — L739 Follicular disorder, unspecified: Secondary | ICD-10-CM | POA: Diagnosis not present

## 2016-05-27 DIAGNOSIS — R21 Rash and other nonspecific skin eruption: Secondary | ICD-10-CM | POA: Diagnosis not present

## 2016-05-27 DIAGNOSIS — L738 Other specified follicular disorders: Secondary | ICD-10-CM | POA: Diagnosis not present

## 2016-05-27 DIAGNOSIS — L0889 Other specified local infections of the skin and subcutaneous tissue: Secondary | ICD-10-CM | POA: Diagnosis not present

## 2016-06-02 ENCOUNTER — Telehealth: Payer: Self-pay

## 2016-06-02 MED ORDER — ESTRADIOL 1 MG PO TABS: 1.0000 mg | ORAL_TABLET | Freq: Every day | ORAL | 0 refills | Status: DC

## 2016-06-02 NOTE — Telephone Encounter (Signed)
Encounter not needed

## 2016-06-16 DIAGNOSIS — E119 Type 2 diabetes mellitus without complications: Secondary | ICD-10-CM | POA: Diagnosis not present

## 2016-07-16 ENCOUNTER — Encounter: Payer: Self-pay | Admitting: Gynecology

## 2016-08-11 ENCOUNTER — Ambulatory Visit (INDEPENDENT_AMBULATORY_CARE_PROVIDER_SITE_OTHER): Payer: 59 | Admitting: Gynecology

## 2016-08-11 ENCOUNTER — Encounter: Payer: Self-pay | Admitting: Gynecology

## 2016-08-11 VITALS — BP 126/80 | Ht <= 58 in | Wt 195.0 lb

## 2016-08-11 DIAGNOSIS — Z01419 Encounter for gynecological examination (general) (routine) without abnormal findings: Secondary | ICD-10-CM

## 2016-08-11 DIAGNOSIS — Z79890 Hormone replacement therapy (postmenopausal): Secondary | ICD-10-CM | POA: Diagnosis not present

## 2016-08-11 MED ORDER — ESTRADIOL 1 MG PO TABS: 1.0000 mg | ORAL_TABLET | Freq: Every day | ORAL | 4 refills | Status: DC

## 2016-08-11 NOTE — Progress Notes (Signed)
[name] [birth year] [id number]   History:    51 y.o.  for annual gyn exam with no complaints today. Review of patient's record indicated that in my previous partner had started her on Estrace 1 mg daily for menopausal symptoms and she's currently with a [name] that was placed in 2015. She reports no vaginal bleeding. Patient with no past history of any abnormal Pap smears. Her PCP Dr. Joneen Caraway has been doing her blood work.  Past medical history,surgical history, family history and social history were all reviewed and documented in the EPIC chart.  Gynecologic History No LMP recorded. Patient is not currently having periods (Reason: IUD). Contraception: IUD Last Pap: 2015. Results were: normal Last mammogram: 2017. Results were: normal  Obstetric History OB History  Gravida Para Term Preterm AB Living  2 1 1   1 1   SAB TAB Ectopic Multiple Live Births               # Outcome Date GA Lbr Len/2nd Weight Sex Delivery Anes PTL Lv  2 AB           1 Term                ROS: A ROS was performed and pertinent positives and negatives are included in the history.  GENERAL: No fevers or chills. HEENT: No change in vision, no earache, sore throat or sinus congestion. NECK: No pain or stiffness. CARDIOVASCULAR: No chest pain or pressure. No palpitations. PULMONARY: No shortness of breath, cough or wheeze. GASTROINTESTINAL: No abdominal pain, nausea, vomiting or diarrhea, melena or bright red blood per rectum. GENITOURINARY: No urinary frequency, urgency, hesitancy or dysuria. MUSCULOSKELETAL: No joint or muscle pain, no back pain, no recent trauma. DERMATOLOGIC: No rash, no itching, no lesions. ENDOCRINE: No polyuria, polydipsia, no heat or cold intolerance. No recent change in weight. HEMATOLOGICAL: No anemia or easy bruising or bleeding. NEUROLOGIC: No headache, seizures, numbness, tingling or weakness. PSYCHIATRIC: No depression, no loss of interest in normal activity or change in  sleep pattern.     Exam: chaperone present  BP 126/80   Ht 4\' 10"  (1.473 m)   Wt 195 lb (88.5 kg)   BMI 40.76 kg/m   Body mass index is 40.76 kg/m.  General appearance : Well developed well nourished female. No acute distress HEENT: Eyes: no retinal hemorrhage or exudates,  Neck supple, trachea midline, no carotid bruits, no thyroidmegaly Lungs: Clear to auscultation, no rhonchi or wheezes, or rib retractions  Heart: Regular rate and rhythm, no murmurs or gallops Breast:Examined in sitting and supine position were symmetrical in appearance, no palpable masses or tenderness,  no skin retraction, no nipple inversion, no nipple discharge, no skin discoloration, no axillary or supraclavicular lymphadenopathy Abdomen: no palpable masses or tenderness, no rebound or guarding Extremities: no edema or skin discoloration or tenderness  Pelvic:  Bartholin, Urethra, Skene Glands: Within normal limits             Vagina: No gross lesions or discharge  Cervix: No gross lesions or discharge, tented of IUD string seen  Uterus  anteverted, normal size, shape and consistency, non-tender and mobile  Adnexa  Without masses or tenderness  Anus and perineum  normal   Rectovaginal  normal sphincter tone without palpated masses or tenderness             Hemoccult not indicated     Assessment/Plan:  51 y.o. female for annual exam menopausal with [name]  to be removed in 2020 also on Estrace 1 mg daily but no vaginal bleeding. We did discuss that she should not be on hormonal replacement therapy for more than 8-10 years. Patient fully understands. Pap smear was done today without HPV. She is due for mammogram in December this year. We discussed importance of calcium vitamin D and weightbearing exercises for osteoporosis prevention.   Terrance Mass MD, 10:04 AM 08/11/2016

## 2016-08-12 LAB — PAP IG W/ RFLX HPV ASCU

## 2016-12-04 LAB — HM DIABETES EYE EXAM

## 2016-12-05 ENCOUNTER — Emergency Department (HOSPITAL_COMMUNITY): Payer: 59

## 2016-12-05 ENCOUNTER — Emergency Department (HOSPITAL_COMMUNITY)
Admission: EM | Admit: 2016-12-05 | Discharge: 2016-12-06 | Disposition: A | Discharge status: Home / Self Care | Payer: 59 | Attending: Emergency Medicine | Admitting: Emergency Medicine

## 2016-12-05 ENCOUNTER — Encounter (HOSPITAL_COMMUNITY): Payer: Self-pay | Admitting: Emergency Medicine

## 2016-12-05 DIAGNOSIS — S7012XA Contusion of left thigh, initial encounter: Secondary | ICD-10-CM

## 2016-12-05 DIAGNOSIS — Y9382 Activity, spectator at an event: Secondary | ICD-10-CM | POA: Insufficient documentation

## 2016-12-05 DIAGNOSIS — M79652 Pain in left thigh: Secondary | ICD-10-CM | POA: Diagnosis not present

## 2016-12-05 DIAGNOSIS — Y998 Other external cause status: Secondary | ICD-10-CM | POA: Insufficient documentation

## 2016-12-05 DIAGNOSIS — S79912A Unspecified injury of left hip, initial encounter: Secondary | ICD-10-CM | POA: Diagnosis not present

## 2016-12-05 DIAGNOSIS — Z79899 Other long term (current) drug therapy: Secondary | ICD-10-CM | POA: Insufficient documentation

## 2016-12-05 DIAGNOSIS — Z7982 Long term (current) use of aspirin: Secondary | ICD-10-CM | POA: Insufficient documentation

## 2016-12-05 DIAGNOSIS — Y92321 Football field as the place of occurrence of the external cause: Secondary | ICD-10-CM | POA: Insufficient documentation

## 2016-12-05 DIAGNOSIS — S79922A Unspecified injury of left thigh, initial encounter: Secondary | ICD-10-CM | POA: Diagnosis present

## 2016-12-05 DIAGNOSIS — E119 Type 2 diabetes mellitus without complications: Secondary | ICD-10-CM | POA: Diagnosis not present

## 2016-12-05 DIAGNOSIS — I10 Essential (primary) hypertension: Secondary | ICD-10-CM | POA: Diagnosis not present

## 2016-12-05 DIAGNOSIS — T148XXA Other injury of unspecified body region, initial encounter: Secondary | ICD-10-CM | POA: Diagnosis not present

## 2016-12-05 DIAGNOSIS — Z7984 Long term (current) use of oral hypoglycemic drugs: Secondary | ICD-10-CM | POA: Insufficient documentation

## 2016-12-05 DIAGNOSIS — S7011XA Contusion of right thigh, initial encounter: Secondary | ICD-10-CM | POA: Diagnosis not present
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
## 2016-12-05 NOTE — ED Notes (Signed)
Bed: WA20 Expected date:  Expected time:  Means of arrival:  Comments: EMS 51 yo female fall-posterior left leg pain/Fentanyl 50 mcg IV
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
## 2016-12-06 DIAGNOSIS — S7012XA Contusion of left thigh, initial encounter: Secondary | ICD-10-CM | POA: Diagnosis not present

## 2016-12-06 MED ORDER — NAPROXEN 500 MG PO TABS: 500.0000 mg | ORAL_TABLET | Freq: Two times a day (BID) | ORAL | 0 refills | Status: DC

## 2016-12-06 MED ORDER — KETOROLAC TROMETHAMINE 15 MG/ML IJ SOLN
15.0000 mg | Freq: Once | INTRAMUSCULAR | Status: AC
  Administered 2016-12-06: 15 mg via INTRAVENOUS
  Filled 2016-12-06: qty 1

## 2016-12-06 NOTE — Discharge Instructions (Signed)
Return to the ER immediately if you develop worsening leg pain, swelling, or a firmness to her leg. This could indicate a very serious condition called compartment syndrome. Other signs and symptoms include change in the color of the skin and a numbness or changes in sensation. Otherwise take anti-inflammatories, ice the area, and follow-up with her primary care doctor.

## 2016-12-16 DIAGNOSIS — E11649 Type 2 diabetes mellitus with hypoglycemia without coma: Secondary | ICD-10-CM | POA: Diagnosis not present

## 2016-12-16 DIAGNOSIS — Z0000 Encounter for general adult medical examination without abnormal findings: Secondary | ICD-10-CM | POA: Diagnosis not present

## 2016-12-16 DIAGNOSIS — Z23 Encounter for immunization: Secondary | ICD-10-CM | POA: Diagnosis not present

## 2016-12-16 DIAGNOSIS — I10 Essential (primary) hypertension: Secondary | ICD-10-CM | POA: Diagnosis not present

## 2016-12-16 DIAGNOSIS — E7800 Pure hypercholesterolemia, unspecified: Secondary | ICD-10-CM | POA: Diagnosis not present

## 2016-12-22 ENCOUNTER — Encounter (INDEPENDENT_AMBULATORY_CARE_PROVIDER_SITE_OTHER): Payer: Self-pay | Admitting: Physician Assistant

## 2016-12-22 ENCOUNTER — Ambulatory Visit (INDEPENDENT_AMBULATORY_CARE_PROVIDER_SITE_OTHER): Payer: 59 | Admitting: Physician Assistant

## 2016-12-22 DIAGNOSIS — S7012XA Contusion of left thigh, initial encounter: Secondary | ICD-10-CM | POA: Diagnosis not present

## 2016-12-22 MED ORDER — CYCLOBENZAPRINE HCL 10 MG PO TABS: 10.0000 mg | ORAL_TABLET | Freq: Three times a day (TID) | ORAL | 1 refills | Status: DC | PRN

## 2016-12-22 MED ORDER — NAPROXEN 500 MG PO TABS: 500.0000 mg | ORAL_TABLET | Freq: Two times a day (BID) | ORAL | 1 refills | Status: DC
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
## 2016-12-24 DIAGNOSIS — M79606 Pain in leg, unspecified: Secondary | ICD-10-CM | POA: Diagnosis not present

## 2016-12-31 DIAGNOSIS — M79606 Pain in leg, unspecified: Secondary | ICD-10-CM | POA: Diagnosis not present

## 2017-01-05 ENCOUNTER — Encounter (INDEPENDENT_AMBULATORY_CARE_PROVIDER_SITE_OTHER): Payer: Self-pay

## 2017-01-05 ENCOUNTER — Ambulatory Visit (INDEPENDENT_AMBULATORY_CARE_PROVIDER_SITE_OTHER): Payer: 59 | Admitting: Physician Assistant

## 2017-01-07 ENCOUNTER — Ambulatory Visit (INDEPENDENT_AMBULATORY_CARE_PROVIDER_SITE_OTHER): Payer: 59

## 2017-01-07 ENCOUNTER — Encounter (INDEPENDENT_AMBULATORY_CARE_PROVIDER_SITE_OTHER): Payer: Self-pay | Admitting: Physician Assistant

## 2017-01-07 ENCOUNTER — Ambulatory Visit (INDEPENDENT_AMBULATORY_CARE_PROVIDER_SITE_OTHER): Payer: 59 | Admitting: Physician Assistant

## 2017-01-07 DIAGNOSIS — S7012XS Contusion of left thigh, sequela: Secondary | ICD-10-CM

## 2017-01-07 DIAGNOSIS — M545 Low back pain: Secondary | ICD-10-CM

## 2017-01-07 DIAGNOSIS — S7002XS Contusion of left hip, sequela: Secondary | ICD-10-CM

## 2017-01-07 MED ORDER — CYCLOBENZAPRINE HCL 10 MG PO TABS: 10.0000 mg | ORAL_TABLET | Freq: Three times a day (TID) | ORAL | 1 refills | Status: DC | PRN

## 2017-01-08 ENCOUNTER — Encounter (INDEPENDENT_AMBULATORY_CARE_PROVIDER_SITE_OTHER): Payer: Self-pay | Admitting: Physician Assistant

## 2017-01-08 DIAGNOSIS — M545 Low back pain, unspecified: Secondary | ICD-10-CM | POA: Insufficient documentation

## 2017-01-08 DIAGNOSIS — S7002XS Contusion of left hip, sequela: Secondary | ICD-10-CM | POA: Insufficient documentation

## 2017-01-08 DIAGNOSIS — S7012XS Contusion of left thigh, sequela: Secondary | ICD-10-CM
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
## 2017-01-12 DIAGNOSIS — M79606 Pain in leg, unspecified: Secondary | ICD-10-CM | POA: Diagnosis not present

## 2017-02-02 ENCOUNTER — Encounter (INDEPENDENT_AMBULATORY_CARE_PROVIDER_SITE_OTHER): Payer: Self-pay | Admitting: Physician Assistant

## 2017-02-02 ENCOUNTER — Ambulatory Visit (INDEPENDENT_AMBULATORY_CARE_PROVIDER_SITE_OTHER): Payer: 59 | Admitting: Physician Assistant

## 2017-02-02 DIAGNOSIS — S7012XS Contusion of left thigh, sequela: Secondary | ICD-10-CM | POA: Diagnosis not present

## 2017-02-02 DIAGNOSIS — S7002XS Contusion of left hip, sequela: Secondary | ICD-10-CM

## 2017-02-02 DIAGNOSIS — M545 Low back pain: Secondary | ICD-10-CM

## 2017-02-02 DIAGNOSIS — M7062 Trochanteric bursitis, left hip: Secondary | ICD-10-CM | POA: Diagnosis not present

## 2017-02-02 MED ORDER — METHYLPREDNISOLONE ACETATE 40 MG/ML IJ SUSP
40.0000 mg | INTRAMUSCULAR | Status: AC | PRN
  Administered 2017-02-02: 40 mg via INTRA_ARTICULAR

## 2017-02-02 MED ORDER — LIDOCAINE HCL 1 % IJ SOLN
3.0000 mL | INTRAMUSCULAR | Status: AC | PRN
  Administered 2017-02-02: 3 mL

## 2017-02-02 NOTE — Progress Notes (Signed)
Office Visit Note   Patient: [name]           Date of Birth: 04-28-1965           MRN: [id number] Visit Date: 02/02/2017              Requested by: Maury Dus, MD [address] [place], [place] [address] PCP: Maury Dus, MD   Assessment & Plan: Visit Diagnoses:  1. Low back pain, unspecified back pain laterality, unspecified chronicity, with sciatica presence unspecified   2. Contusion, thigh and hip, left, sequela   3. Trochanteric bursitis, left hip     Plan: Continue physical therapy and home exercise program for back exercises and stretching of the hamstrings.  She is shown IT band stretching exercises that she is to perform on her own at home did have her demonstrate this to me.  Follow-Up Instructions: Return in about 4 weeks (around 03/02/2017).   Orders:  Orders Placed This Encounter  Procedures  . Large Joint Inj: L greater trochanter   No orders of the defined types were placed in this encounter.     Procedures: Large Joint Inj: L greater trochanter on 02/02/2017 5:42 PM Indications: pain Details: 22 G 1.5 in needle, lateral approach  Arthrogram: No  Medications: 3 mL lidocaine 1 %; 40 mg methylPREDNISolone acetate 40 MG/ML Outcome: tolerated well, no immediate complications Procedure, treatment alternatives, risks and benefits explained, specific risks discussed. Consent was given by the patient. Immediately prior to procedure a time out was called to verify the correct patient, procedure, equipment, support staff and site/side marked as required. Patient was prepped and draped in the usual sterile fashion.       Clinical Data: No additional findings.   Subjective: Left Thigh pain and muscle spasm  HPI Mrs. [name] returns today for follow-up of her left thigh injury almost 2 months status post injury.  She continues to have pain in her thigh with muscle spasms.  She has tingling in her feet which she states is less  severe than it has been.  She feels overall that she is 65-75% better since undergoing physical therapy.  She states she has difficulty walking up steps and has to take 1 at a time slowly due to pain in her thigh.  She notes that she has pain on the lateral aspect of her left hip if she tries to lie on it at night.  She is having no waking pain. Review of Systems Please see HPI otherwise negative  Objective: Vital Signs: There were no vitals taken for this visit.  Physical Exam Well-developed well-nourished female in no acute distress mood and affect appropriate Ortho Exam 5 out of 5 strength throughout the lower extremities against resistance.  Negative straight leg raise bilaterally.  She comes within 2 inches of being able to touch her toes with forward flexion of the lumbar spine.  She has good extension of lumbar spine.  Tenderness over the left trochanteric region.  Good range of motion of bilateral hips otherwise. Specialty Comments:  No specialty comments available.  Imaging: No results found.   PMFS History: Patient Active Problem List   Diagnosis Date Noted  . Contusion, thigh and hip, left, sequela 01/08/2017  . Low back pain 01/08/2017  . IUD (intrauterine device) in place 05/16/2014  . Atypical chest pain 11/18/2012  . Injection site extravasation of IV contrast 11/18/2012  . DM (diabetes mellitus), type 2 ([place]) 11/18/2012  . Benign hypertension  11/18/2012  . Hypokalemia 11/18/2012  . Menopause 09/16/2011  . Elevated cholesterol    Past Medical History:  Diagnosis Date  . Arthritis   . Diabetes mellitus   . Elevated cholesterol   . Endometrial polyp   . Endometriosis   . Hypertension     Family History  Problem Relation Age of Onset  . Hypertension Mother   . Diabetes Mother   . Hypertension Father   . Diabetes Sister   . Hypertension Brother   . Diabetes Maternal Grandfather     Past Surgical History:  Procedure Laterality Date  . COMBINED  HYSTEROSCOPY DIAGNOSTIC / D&C    . DIAGNOSTIC LAPAROSCOPY  1993   with laser adhesions  . INTRAUTERINE DEVICE INSERTION  03/2006   mirena  . KNEE SURGERY    . OOPHORECTOMY  2008   left  . ROTATOR CUFF REPAIR    . trichomoniasis  01/2013  . WRIST SURGERY     gang. cyst   Social History   Occupational History  . Not on file  Tobacco Use  . Smoking status: Never Smoker  . Smokeless tobacco: Never Used  Substance and Sexual Activity  . Alcohol use: Yes    Alcohol/week: 0.0 oz    Comment: rare  . Drug use: No  . Sexual activity: Yes    Birth control/protection: IUD    Comment: Mirena inserted 03-12-11.. 1st intercourse- 19, partners- 5

## 2017-02-04 ENCOUNTER — Other Ambulatory Visit: Payer: Self-pay | Admitting: Gynecology

## 2017-02-04 DIAGNOSIS — Z139 Encounter for screening, unspecified: Secondary | ICD-10-CM

## 2017-02-05 DIAGNOSIS — M79606 Pain in leg, unspecified: Secondary | ICD-10-CM | POA: Diagnosis not present

## 2017-02-12 DIAGNOSIS — M79606 Pain in leg, unspecified: Secondary | ICD-10-CM | POA: Diagnosis not present

## 2017-02-25 ENCOUNTER — Other Ambulatory Visit: Payer: Self-pay | Admitting: *Deleted

## 2017-03-05 ENCOUNTER — Ambulatory Visit (INDEPENDENT_AMBULATORY_CARE_PROVIDER_SITE_OTHER): Payer: 59 | Admitting: Physician Assistant

## 2017-03-05 ENCOUNTER — Ambulatory Visit: Payer: 59

## 2017-03-12 ENCOUNTER — Encounter (INDEPENDENT_AMBULATORY_CARE_PROVIDER_SITE_OTHER): Payer: Self-pay | Admitting: Physician Assistant

## 2017-03-12 ENCOUNTER — Ambulatory Visit (INDEPENDENT_AMBULATORY_CARE_PROVIDER_SITE_OTHER): Payer: 59 | Admitting: Physician Assistant

## 2017-03-12 DIAGNOSIS — S7002XS Contusion of left hip, sequela: Secondary | ICD-10-CM | POA: Diagnosis not present

## 2017-03-12 DIAGNOSIS — M76821 Posterior tibial tendinitis, right leg: Secondary | ICD-10-CM

## 2017-03-12 DIAGNOSIS — S7012XS Contusion of left thigh, sequela: Secondary | ICD-10-CM

## 2017-03-12 DIAGNOSIS — M545 Low back pain: Secondary | ICD-10-CM

## 2017-03-12 MED ORDER — DICLOFENAC SODIUM 1 % TD GEL: 4.0000 g | Freq: Four times a day (QID) | TRANSDERMAL | 2 refills | Status: DC

## 2017-03-12 NOTE — Progress Notes (Signed)
Office Visit Note   Patient: [name]           Date of Birth: [birth year]           MRN: [id number] Visit Date: 03/12/2017              Requested by: Maury Dus, MD [address] [place], [place] [address] PCP: Maury Dus, MD   Assessment & Plan: Visit Diagnoses:  1. Low back pain, unspecified back pain laterality, unspecified chronicity, with sciatica presence unspecified   2. Contusion, thigh and hip, left, sequela   3. Posterior tibial tendinitis, right leg     Plan: She will do heel raises bilaterally.  Apply Voltaren gel 4 g up to 4 times daily to the right posterior tibial tendon.  She does have a boot that she can go in home of her tendons starts bothering her.  She can always follow-up with [place] for treatment of the posterior tibial tendon.  Did offer formal therapy she declines.  In regards to her low back and left hip contusion this is resolved and she is at maximal medical improvement having no pain in her low back or hip at this point time.  She will follow-up with [place] on an as-needed basis.  Follow-Up Instructions: Return if symptoms worsen or fail to improve.   Orders:  No orders of the defined types were placed in this encounter.  Meds ordered this encounter  Medications  . diclofenac sodium (VOLTAREN) 1 % GEL    Sig: Apply 4 g topically 4 (four) times daily. Right posterior tibial tendon    Dispense:  3 Tube    Refill:  2      Procedures: No procedures performed   Clinical Data: No additional findings.   Subjective: Chief Complaint  Patient presents with  . Left Hip - Follow-up    HPI [name] returns today for follow-up of her left hip pain and low back pain.  She states that overall she is doing very well.  She is no longer having any pain in the left hip or the low back.  She denies any radicular symptoms down either leg.  She is finished with therapy.  She found therapy with a dry needling to be very beneficial.  She  also found the trochanteric injection to be beneficial. She has a new complaint of in her right foot pain that is not related to the incident that occurred on 12/05/2016..  She denies any injury to her right foot.  Pain is been ongoing for the past 2-3 weeks but she has had intermittent pain in this region in the past.  Review of Systems See HPI otherwise negative  Objective: Vital Signs: There were no vitals taken for this visit.  Physical Exam  Constitutional: She is oriented to person, place, and time. She appears well-developed and well-nourished. No distress.  Pulmonary/Chest: Effort normal.  Neurological: She is alert and oriented to person, place, and time.  Skin: She is not diaphoretic.  Psychiatric: She has a normal mood and affect.    Ortho Exam Bilateral hips good range of motion without pain.  Nontender over the left trochanteric region.  Negative straight leg raise bilaterally. Right foot pes planus with tenderness at the insertion of the posterior tibial tendon on the navicular.  No erythema ecchymosis impending ulcers of either foot.  She is able to do single heel raise bilaterally.  No pain with inversion eversion of bilateral feet against  resistance and 5 out of 5 strength bilaterally. Specialty Comments:  No specialty comments available.  Imaging: No results found.   PMFS History: Patient Active Problem List   Diagnosis Date Noted  . Contusion, thigh and hip, left, sequela 01/08/2017  . Low back pain 01/08/2017  . IUD (intrauterine device) in place 05/16/2014  . Atypical chest pain 11/18/2012  . Injection site extravasation of IV contrast 11/18/2012  . DM (diabetes mellitus), type 2 ([place]) 11/18/2012  . Benign hypertension 11/18/2012  . Hypokalemia 11/18/2012  . Menopause 09/16/2011  . Elevated cholesterol    Past Medical History:  Diagnosis Date  . Arthritis   . Diabetes mellitus   . Elevated cholesterol   . Endometrial polyp   . Endometriosis   .  Hypertension     Family History  Problem Relation Age of Onset  . Hypertension Mother   . Diabetes Mother   . Hypertension Father   . Diabetes Sister   . Hypertension Brother   . Diabetes Maternal Grandfather     Past Surgical History:  Procedure Laterality Date  . COMBINED HYSTEROSCOPY DIAGNOSTIC / D&C    . DIAGNOSTIC LAPAROSCOPY  1993   with laser adhesions  . INTRAUTERINE DEVICE INSERTION  03/2006   mirena  . KNEE SURGERY    . OOPHORECTOMY  2008   left  . ROTATOR CUFF REPAIR    . trichomoniasis  01/2013  . WRIST SURGERY     gang. cyst   Social History   Occupational History  . Not on file  Tobacco Use  . Smoking status: Never Smoker  . Smokeless tobacco: Never Used  Substance and Sexual Activity  . Alcohol use: Yes    Alcohol/week: 0.0 oz    Comment: rare  . Drug use: No  . Sexual activity: Yes    Birth control/protection: IUD    Comment: Mirena inserted 03-12-11.. 1st intercourse- 19, partners- 5

## 2017-03-24 ENCOUNTER — Ambulatory Visit
Admission: RE | Admit: 2017-03-24 | Discharge: 2017-03-24 | Disposition: A | Discharge status: Home / Self Care | Payer: 59 | Source: Ambulatory Visit | Attending: Gynecology | Admitting: Gynecology

## 2017-03-24 DIAGNOSIS — Z1231 Encounter for screening mammogram for malignant neoplasm of breast: Secondary | ICD-10-CM | POA: Diagnosis not present

## 2017-03-24 DIAGNOSIS — Z139 Encounter for screening, unspecified: Secondary | ICD-10-CM

## 2017-04-29 NOTE — Telephone Encounter (Signed)
Error

## 2017-05-26 ENCOUNTER — Telehealth (INDEPENDENT_AMBULATORY_CARE_PROVIDER_SITE_OTHER): Payer: Self-pay

## 2017-05-26 ENCOUNTER — Encounter (HOSPITAL_COMMUNITY): Payer: Self-pay | Admitting: Family Medicine

## 2017-05-26 ENCOUNTER — Ambulatory Visit (HOSPITAL_COMMUNITY)
Admission: EM | Admit: 2017-05-26 | Discharge: 2017-05-26 | Disposition: A | Discharge status: Home / Self Care | Payer: 59 | Attending: Family Medicine | Admitting: Family Medicine

## 2017-05-26 DIAGNOSIS — M25551 Pain in right hip: Secondary | ICD-10-CM | POA: Diagnosis not present

## 2017-05-26 MED ORDER — METHYLPREDNISOLONE SODIUM SUCC 125 MG IJ SOLR
80.0000 mg | Freq: Once | INTRAMUSCULAR | Status: AC
  Administered 2017-05-26: 80 mg via INTRAMUSCULAR

## 2017-05-26 MED ORDER — METHYLPREDNISOLONE SODIUM SUCC 125 MG IJ SOLR
INTRAMUSCULAR | Status: AC
  Filled 2017-05-26: qty 2

## 2017-05-26 MED ORDER — HYDROCODONE-ACETAMINOPHEN 5-325 MG PO TABS: 1.0000 | ORAL_TABLET | Freq: Four times a day (QID) | ORAL | 0 refills | Status: DC | PRN

## 2017-05-26 NOTE — Telephone Encounter (Signed)
Patient called again concerning being worked in to see Dr. Ninfa Linden or Erskine Emery.  Stated that she went to the Urgent Care and was told to follow-up with her orthopedic doctor.  Cb# is [phone].

## 2017-05-26 NOTE — ED Triage Notes (Addendum)
Pt here for right hip pain since Sunday. sts started after propping her leg up. sts locked up and wouldn't move. No fall or injury. She has been taking naproxen 500 mg since yesterday. She has a hx of chronic hip problems. She called her orthopedic and they couldn't see her until tomorrow.

## 2017-05-26 NOTE — Telephone Encounter (Signed)
Patient would like to be worked in to see Artis Delay or [place] for right hip pain.  I know patient can not be worked in today. Please advise.  Thank You.  Cb# is [phone].

## 2017-05-26 NOTE — Telephone Encounter (Signed)
Worked patient in the schedule tomorrow

## 2017-05-26 NOTE — ED Provider Notes (Signed)
[name]   [id number] 05/26/17 Arrival Time: 1059  ASSESSMENT & PLAN:  1. Right hip pain   -acute on chronic  Meds ordered this encounter  Medications  . methylPREDNISolone sodium succinate (SOLU-MEDROL) 125 mg/2 mL injection 80 mg  . HYDROcodone-acetaminophen (NORCO/VICODIN) 5-325 MG tablet    Sig: Take 1 tablet by mouth every 6 (six) hours as needed for moderate pain or severe pain.    Dispense:  6 tablet    Refill:  0   [place] Controlled Substances Registry consulted for this patient. I feel the risk/benefit ratio today is favorable for proceeding with this prescription for a controlled substance. Medication sedation precautions given. Plans f/u with her PCP.  Reviewed expectations re: course of current medical issues. Questions answered. Outlined signs and symptoms indicating need for more acute intervention. Patient verbalized understanding. After Visit Summary given.  SUBJECTIVE: History from: patient. [name] is a 52 y.o. female with a history of chronic hip pain; infrequent exacerbations. Reports intermittent localized mild to moderate pain of her right hip that is stable; described as aching without radiation. Typical of past exacerbations. Onset: gradual, a few days ago. Injury/trama: no specific injury. Relieved by: sitting. Worsened by: certain movements. Associated symptoms: none reported. Extremity sensation changes or weakness: none. Self treatment: tried OTCs without relief of pain. History of similar: yes  ROS: As per HPI.   OBJECTIVE:  Vitals:   05/26/17 1116  BP: (!) 148/89  Pulse: 88  Resp: 18  Temp: 98.2 F (36.8 C)  SpO2: 100%    General appearance: alert; no distress Extremities: no cyanosis or edema; symmetrical with no gross deformities; diffuse tenderness over her right buttock and posterior thigh with no swelling and no bruising; ROM: limited by reported pain CV: normal extremity capillary refill Skin: warm and  dry Neurologic: normal gait; normal symmetric reflexes in all extremities; normal sensation in all extremities Psychological: alert and cooperative; normal mood and affect  Allergies  Allergen Reactions  . Sulfa Antibiotics Hives  . Tylox [Oxycodone-Acetaminophen] Nausea And Vomiting  . Clindamycin/Lincomycin Rash    Past Medical History:  Diagnosis Date  . Arthritis   . Diabetes mellitus   . Elevated cholesterol   . Endometrial polyp   . Endometriosis   . Hypertension    Social History   Socioeconomic History  . Marital status: Married    Spouse name: Not on file  . Number of children: Not on file  . Years of education: Not on file  . Highest education level: Not on file  Occupational History  . Not on file  Social Needs  . Financial resource strain: Not on file  . Food insecurity:    Worry: Not on file    Inability: Not on file  . Transportation needs:    Medical: Not on file    Non-medical: Not on file  Tobacco Use  . Smoking status: Never Smoker  . Smokeless tobacco: Never Used  Substance and Sexual Activity  . Alcohol use: Yes    Alcohol/week: 0.0 oz    Comment: rare  . Drug use: No  . Sexual activity: Yes    Birth control/protection: IUD    Comment: Mirena inserted 03-12-11.. 1st intercourse- 19, partners- 5  Lifestyle  . Physical activity:    Days per week: Not on file    Minutes per session: Not on file  . Stress: Not on file  Relationships  . Social connections:    Talks on phone: Not on  file    Gets together: Not on file    Attends religious service: Not on file    Active member of club or organization: Not on file    Attends meetings of clubs or organizations: Not on file    Relationship status: Not on file  . Intimate partner violence:    Fear of current or ex partner: Not on file    Emotionally abused: Not on file    Physically abused: Not on file    Forced sexual activity: Not on file  Other Topics Concern  . Not on file  Social History  Narrative  . Not on file   Family History  Problem Relation Age of Onset  . Hypertension Mother   . Diabetes Mother   . Hypertension Father   . Diabetes Sister   . Hypertension Brother   . Diabetes Maternal Grandfather    Past Surgical History:  Procedure Laterality Date  . COMBINED HYSTEROSCOPY DIAGNOSTIC / D&C    . DIAGNOSTIC LAPAROSCOPY  1993   with laser adhesions  . INTRAUTERINE DEVICE INSERTION  03/2006   mirena  . KNEE SURGERY    . OOPHORECTOMY  2008   left  . ROTATOR CUFF REPAIR    . trichomoniasis  01/2013  . WRIST SURGERY     gang. cyst      Vanessa Kick, MD 05/27/17 [phone]

## 2017-05-26 NOTE — Discharge Instructions (Signed)
Be aware, pain medications may cause drowsiness. Please do not drive, operate heavy machinery or make important decisions while on this medication, it can cloud your judgement.  

## 2017-05-27 ENCOUNTER — Ambulatory Visit (INDEPENDENT_AMBULATORY_CARE_PROVIDER_SITE_OTHER): Payer: 59

## 2017-05-27 ENCOUNTER — Encounter (INDEPENDENT_AMBULATORY_CARE_PROVIDER_SITE_OTHER): Payer: Self-pay | Admitting: Orthopaedic Surgery

## 2017-05-27 ENCOUNTER — Ambulatory Visit (INDEPENDENT_AMBULATORY_CARE_PROVIDER_SITE_OTHER): Payer: 59 | Admitting: Orthopaedic Surgery

## 2017-05-27 DIAGNOSIS — M25551 Pain in right hip: Secondary | ICD-10-CM | POA: Insufficient documentation

## 2017-05-27 DIAGNOSIS — M7061 Trochanteric bursitis, right hip: Secondary | ICD-10-CM

## 2017-05-27 MED ORDER — METHOCARBAMOL 500 MG PO TABS: 500.0000 mg | ORAL_TABLET | Freq: Four times a day (QID) | ORAL | 0 refills | Status: DC | PRN

## 2017-05-27 MED ORDER — TRAMADOL HCL 50 MG PO TABS: 50.0000 mg | ORAL_TABLET | Freq: Four times a day (QID) | ORAL | 0 refills | Status: DC | PRN

## 2017-05-27 NOTE — Progress Notes (Signed)
Office Visit Note   Patient: [name]           Date of Birth: 06-13-1965           MRN: [id number] Visit Date: 05/27/2017              Requested by: Maury Dus, MD [address], [place] [id number] PCP: Maury Dus, MD   Assessment & Plan: Visit Diagnoses:  1. Pain in right hip   2. Trochanteric bursitis, right hip     Plan: Hopefully this is just a flareup of severe hip bursitis.  There can be a sciatic component of this as well.  We will try combination of tramadol and methocarbamol.  Also offered a steroid injection of the trochanteric area and she is agreeable to this and tolerated it well.  We will see her back in about 2 weeks to see if she is had good progress or not.  Follow-Up Instructions: Return in about 2 weeks (around 06/10/2017).   Orders:  Orders Placed This Encounter  Procedures  . XR HIP UNILAT W OR W/O PELVIS 2-3 VIEWS RIGHT   Meds ordered this encounter  Medications  . traMADol (ULTRAM) 50 MG tablet    Sig: Take 1-2 tablets (50-100 mg total) by mouth every 6 (six) hours as needed.    Dispense:  60 tablet    Refill:  0  . methocarbamol (ROBAXIN) 500 MG tablet    Sig: Take 1 tablet (500 mg total) by mouth every 6 (six) hours as needed for muscle spasms.    Dispense:  60 tablet    Refill:  0      Procedures: No procedures performed   Clinical Data: No additional findings.   Subjective: Chief Complaint  Patient presents with  . Right Hip - Pain  The patient comes in today with acute right hip and low back pain.  She is actually seen in urgent care yesterday and she received some type of pain shot and was given a few pills of hydrocodone.  She said the hydrocodone does cause him to have bad headaches.  The pain just started Sunday and she has a history of hip bursitis.  She is had some pain though in the groin it does radiate down her right leg as well.  We seen her for her back as well in the past.  She denies any  change in bowel bladder function or weakness in her leg but the pain is quite significant.  HPI  Review of Systems She currently denies any headache, chest pain, shortness of breath, fever, chills, nausea, vomiting.  Objective: Vital Signs: There were no vitals taken for this visit.  Physical Exam She is alert and oriented x3 and in no acute distress examination of her right hip shows fluid range of motion with internal and external rotation.  She has severe pain D on palpation of the trochanteric area.  There is a sciatic component of this as well. Ortho Exam Distally her motor and sensory exam seems to be fine. Specialty Comments:  No specialty comments available.  Imaging: Xr Hip Unilat W Or W/o Pelvis 2-3 Views Right  Result Date: 05/27/2017 An AP pelvis and a lateral of the right hip shows mild arthritic changes with overhanging osteophytes of both acetabulum.  There are no acute findings otherwise.    PMFS History: Patient Active Problem List   Diagnosis Date Noted  . Trochanteric bursitis, right hip 05/27/2017  .  Pain in right hip 05/27/2017  . Contusion, thigh and hip, left, sequela 01/08/2017  . Low back pain 01/08/2017  . IUD (intrauterine device) in place 05/16/2014  . Atypical chest pain 11/18/2012  . Injection site extravasation of IV contrast 11/18/2012  . DM (diabetes mellitus), type 2 ([place]) 11/18/2012  . Benign hypertension 11/18/2012  . Hypokalemia 11/18/2012  . Menopause 09/16/2011  . Elevated cholesterol    Past Medical History:  Diagnosis Date  . Arthritis   . Diabetes mellitus   . Elevated cholesterol   . Endometrial polyp   . Endometriosis   . Hypertension     Family History  Problem Relation Age of Onset  . Hypertension Mother   . Diabetes Mother   . Hypertension Father   . Diabetes Sister   . Hypertension Brother   . Diabetes Maternal Grandfather     Past Surgical History:  Procedure Laterality Date  . COMBINED HYSTEROSCOPY  DIAGNOSTIC / D&C    . DIAGNOSTIC LAPAROSCOPY  1993   with laser adhesions  . INTRAUTERINE DEVICE INSERTION  03/2006   mirena  . KNEE SURGERY    . OOPHORECTOMY  2008   left  . ROTATOR CUFF REPAIR    . trichomoniasis  01/2013  . WRIST SURGERY     gang. cyst   Social History   Occupational History  . Not on file  Tobacco Use  . Smoking status: Never Smoker  . Smokeless tobacco: Never Used  Substance and Sexual Activity  . Alcohol use: Yes    Alcohol/week: 0.0 oz    Comment: rare  . Drug use: No  . Sexual activity: Yes    Birth control/protection: IUD    Comment: Mirena inserted 03-12-11.. 1st intercourse- 19, partners- 5

## 2017-06-10 ENCOUNTER — Ambulatory Visit (INDEPENDENT_AMBULATORY_CARE_PROVIDER_SITE_OTHER): Payer: 59 | Admitting: Orthopaedic Surgery

## 2017-06-10 ENCOUNTER — Encounter (INDEPENDENT_AMBULATORY_CARE_PROVIDER_SITE_OTHER): Payer: Self-pay | Admitting: Orthopaedic Surgery

## 2017-06-10 DIAGNOSIS — M5441 Lumbago with sciatica, right side: Secondary | ICD-10-CM | POA: Insufficient documentation

## 2017-06-10 DIAGNOSIS — M7061 Trochanteric bursitis, right hip: Secondary | ICD-10-CM | POA: Diagnosis not present

## 2017-06-10 NOTE — Progress Notes (Signed)
The patient is referred for follow-up 2 weeks after placed a steroid injection around her greater trochanteric area on the right side.  This was an acute flareup of a chronic condition however she is also had low back pain with symptoms radiating into her groin and her thigh anteriorly.  The x-rays of her right hip are entirely normal.  I was concerned that this may be a back component of things.  She said the steroid injection is helped some but the muscle relaxants and anti-inflammatories help more.  On exam I can easily put her right hip through internal extra rotation with minimal discomfort.  She does have a positive straight leg raise though on the right side.  She hurts in the lumbar spine and radiates into the groin.  It does radiate down the anterior thigh.  There is no pain of the trochanteric area.  This point I would like to send her to outpatient physical therapy and treat this is a it is a back issue with sciatica.  All the therapist work on any modalities to try to get her feeling better even including traction.  She agrees with this plan as well.  Obviously if this does not work we will obtain an MRI of her lumbar spine.  All questions concerns were answered and addressed.

## 2017-06-12 ENCOUNTER — Other Ambulatory Visit (INDEPENDENT_AMBULATORY_CARE_PROVIDER_SITE_OTHER): Payer: Self-pay | Admitting: Orthopaedic Surgery

## 2017-06-15 NOTE — Telephone Encounter (Signed)
Please advise 

## 2017-07-08 ENCOUNTER — Ambulatory Visit (INDEPENDENT_AMBULATORY_CARE_PROVIDER_SITE_OTHER): Payer: 59 | Admitting: Orthopaedic Surgery

## 2017-08-13 ENCOUNTER — Encounter: Payer: 59 | Admitting: Gynecology

## 2017-08-13 DIAGNOSIS — Z0289 Encounter for other administrative examinations: Secondary | ICD-10-CM

## 2017-09-02 ENCOUNTER — Other Ambulatory Visit: Payer: Self-pay

## 2017-09-09 ENCOUNTER — Telehealth: Payer: Self-pay | Admitting: *Deleted

## 2017-09-09 ENCOUNTER — Other Ambulatory Visit: Payer: Self-pay | Admitting: Gynecology

## 2017-09-09 MED ORDER — ESTRADIOL 1 MG PO TABS: 1.0000 mg | ORAL_TABLET | Freq: Every day | ORAL | 0 refills | Status: DC

## 2017-09-09 NOTE — Telephone Encounter (Signed)
Patient has annual exam scheduled on 10/29/17, needs refill on estrace 1 mg tablet. Rx sent.

## 2017-09-18 ENCOUNTER — Ambulatory Visit: Payer: Self-pay | Admitting: Gynecology

## 2017-09-18 ENCOUNTER — Encounter: Payer: Self-pay | Admitting: Gynecology

## 2017-10-29 ENCOUNTER — Ambulatory Visit (INDEPENDENT_AMBULATORY_CARE_PROVIDER_SITE_OTHER): Payer: 59 | Admitting: Gynecology

## 2017-10-29 ENCOUNTER — Encounter: Payer: Self-pay | Admitting: Gynecology

## 2017-10-29 VITALS — BP 126/82 | Ht 59.0 in | Wt 198.0 lb

## 2017-10-29 DIAGNOSIS — Z01419 Encounter for gynecological examination (general) (routine) without abnormal findings: Secondary | ICD-10-CM

## 2017-10-29 DIAGNOSIS — L299 Pruritus, unspecified: Secondary | ICD-10-CM

## 2017-10-29 DIAGNOSIS — Z79890 Hormone replacement therapy (postmenopausal): Secondary | ICD-10-CM

## 2017-10-29 MED ORDER — ESTRADIOL 1 MG PO TABS: 1.0000 mg | ORAL_TABLET | Freq: Every day | ORAL | 3 refills | Status: DC

## 2017-10-29 NOTE — Progress Notes (Signed)
    VELTA ROCKHOLT 1965/10/02 [id number]        52 y.o.  G2P1011 for annual gynecologic exam.  Without gynecologic complaints.  Does note some skin itching in the folds of her panniculus.  On estradiol 1 mg with good relief of her menopausal symptoms.  Using [name] for endometrial protection.  Past medical history,surgical history, problem list, medications, allergies, family history and social history were all reviewed and documented as reviewed in the EPIC chart.  ROS:  Performed with pertinent positives and negatives included in the history, assessment and plan.   Additional significant findings : None   Exam: [name] assistant Vitals:   10/29/17 1539  BP: 126/82  Weight: 198 lb (89.8 kg)  Height: 4\' 11"  (1.499 m)   Body mass index is 39.99 kg/m.  General appearance:  Normal affect, orientation and appearance. Skin: Grossly normal.  Skin under panniculus without significant rash HEENT: Without gross lesions.  No cervical or supraclavicular adenopathy. Thyroid normal.  Lungs:  Clear without wheezing, rales or rhonchi Cardiac: RR, without RMG Abdominal:  Soft, nontender, without masses, guarding, rebound, organomegaly or hernia Breasts:  Examined lying and sitting without masses, retractions, discharge or axillary adenopathy. Pelvic:  Ext, BUS, Vagina: Normal  Cervix: Normal with IUD string visualized  Uterus: Grossly normal midline mobile nontender  Adnexa: Without masses or tenderness    Anus and perineum: Normal   Rectovaginal: Normal sphincter tone without palpated masses or tenderness.    Assessment/Plan:  52 y.o. G37P1011 female for annual gynecologic exam without menses, [name].   1. Postmenopausal/atrophic genital changes/HRT.  On estradiol 1 mg doing well without significant hot flushes or sweats.  No vaginal bleeding.  Using [name] as endometrial protection.  We discussed the issues of HRT, risks versus benefits.  Symptom relief possible  cardiovascular and bone health when started early versus the risks of thrombosis as well as the breast cancer issue.  Use of Mirena for endometrial protection off brand label.  At this point the patient is comfortable continuing and refill x1 year provided. 2. [name] 05/2014.  IUD string visualized doing well with this.  5-year replacement need reviewed. 3. Mammography 03/2017.  Continue with annual mammography when due.  Breast exam normal today. 4. Pap smears 2018.  No Pap smear done today.  No history of significant abnormal Pap smears.  Plan repeat Pap smear at 3-year interval per current screening guidelines. 5. Health maintenance.  No routine lab work done as patient does this through her primary physician's office.  Recommend screening colonoscopy over the next year so she has turned 53.  Follow-up in 1 year, sooner as needed.   Anastasio Auerbach MD, 4:08 PM 10/29/2017

## 2017-10-29 NOTE — Patient Instructions (Signed)
Try the over-the-counter yeast cream for the skin to see if that does not help with the abdominal itching.  Call if this continues to be an issue and I can prescribe a medication for this.  Follow-up in 1 year for annual exam.

## 2017-11-10 ENCOUNTER — Other Ambulatory Visit: Payer: Self-pay

## 2017-11-10 ENCOUNTER — Encounter: Payer: Self-pay | Admitting: Family Medicine

## 2017-11-10 ENCOUNTER — Ambulatory Visit (INDEPENDENT_AMBULATORY_CARE_PROVIDER_SITE_OTHER): Payer: 59 | Admitting: Family Medicine

## 2017-11-10 VITALS — BP 127/88 | HR 93 | Temp 98.8°F | Resp 16 | Ht 59.0 in | Wt 194.4 lb

## 2017-11-10 DIAGNOSIS — Z6839 Body mass index (BMI) 39.0-39.9, adult: Secondary | ICD-10-CM

## 2017-11-10 DIAGNOSIS — E118 Type 2 diabetes mellitus with unspecified complications: Secondary | ICD-10-CM | POA: Diagnosis not present

## 2017-11-10 DIAGNOSIS — I10 Essential (primary) hypertension: Secondary | ICD-10-CM | POA: Diagnosis not present

## 2017-11-10 DIAGNOSIS — Z23 Encounter for immunization: Secondary | ICD-10-CM | POA: Diagnosis not present

## 2017-11-10 DIAGNOSIS — Z1211 Encounter for screening for malignant neoplasm of colon: Secondary | ICD-10-CM | POA: Diagnosis not present

## 2017-11-10 DIAGNOSIS — E785 Hyperlipidemia, unspecified: Secondary | ICD-10-CM

## 2017-11-10 LAB — POCT GLYCOSYLATED HEMOGLOBIN (HGB A1C): Hemoglobin A1C: 7 % — AB (ref 4.0–5.6)

## 2017-11-10 MED ORDER — FUROSEMIDE 20 MG PO TABS: 20.0000 mg | ORAL_TABLET | Freq: Two times a day (BID) | ORAL | 3 refills | Status: DC | PRN

## 2017-11-10 MED ORDER — SITAGLIPTIN PHOSPHATE 100 MG PO TABS: 100.0000 mg | ORAL_TABLET | Freq: Every day | ORAL | 1 refills | Status: DC

## 2017-11-10 NOTE — Progress Notes (Signed)
Chief Complaint  Patient presents with  . New Patient (Initial Visit)    establish care.  . Medication Refill    furosemide    HPI  Diabetes Mellitus: Patient presents for follow up of diabetes. Symptoms: hyperglycemia. Patient denies hypoglycemia , nausea, polydipsia, polyuria and visual disturbances.  Evaluation to date has been included: hemoglobin A1C.  Home sugars: BGs consistently in an acceptable range. Treatment to date:  Metformin and januvia Lab Results  Component Value Date   HGBA1C 7.0 (A) 11/10/2017    Hypertension: Patient here for follow-up of elevated blood pressure. She is not exercising and is adherent to low salt diet.  Blood pressure is well controlled at home. Cardiac symptoms none. Patient denies chest pain, chest pressure/discomfort, claudication, dyspnea, exertional chest pressure/discomfort, irregular heart beat, near-syncope and orthopnea.  Cardiovascular risk factors: diabetes mellitus, dyslipidemia, obesity (BMI >= 30 kg/m2) and sedentary lifestyle.  BP Readings from Last 3 Encounters:  11/10/17 127/88  10/29/17 126/82  05/26/17 (!) 148/89    Hyperlipidemia: Patient presents with hyperlipidemia.  She was tested because of routine screening. negative. There is a family history of hyperlipidemia. There is a family history of early ischemia heart disease. Her father had a stroke.  Obesity: [name] is a 52 y.o. female who presents for continued treatment for obesity.  There is a positive family history for obesity.  Previous treatments for obesity include: weight loss program on her own. Associated medical conditions: hirsutism.  Cardiovascular risk factors besides obesity: diabetes mellitus, dyslipidemia and hypertension. Wt Readings from Last 3 Encounters:  11/10/17 194 lb 6.4 oz (88.2 kg)  10/29/17 198 lb (89.8 kg)  08/11/16 195 lb (88.5 kg)   Body mass index is 39.26 kg/m.     Past Medical History:  Diagnosis Date  . Arthritis   .  Diabetes mellitus   . Elevated cholesterol   . Endometrial polyp   . Endometriosis   . Hypertension     Current Outpatient Medications  Medication Sig Dispense Refill  . aspirin EC 81 MG EC tablet Take 1 tablet (81 mg total) by mouth daily.    . calcium carbonate (OS-CAL) 600 MG TABS tablet Take 600 mg by mouth daily with breakfast.     . estradiol (ESTRACE) 1 MG tablet Take 1 tablet (1 mg total) by mouth daily. 90 tablet 3  . furosemide (LASIX) 20 MG tablet Take 1 tablet (20 mg total) by mouth 2 (two) times daily as needed (fluid). 30 tablet 3  . metFORMIN (GLUCOPHAGE) 500 MG tablet Take 500 mg by mouth daily with breakfast.   0  . olmesartan-hydrochlorothiazide (BENICAR HCT) 40-12.5 MG per tablet Take 1 tablet by mouth daily. 30 tablet 0  . Potassium 75 MG TABS Take 75 mg by mouth daily.     . promethazine (PHENERGAN) 25 MG tablet TAKE 1 TABLET EVERY 6 HOURS AS NEEDED FOR NAUSEA  1  . rosuvastatin (CRESTOR) 10 MG tablet Take 10 mg by mouth daily after breakfast.     . sitaGLIPtin (JANUVIA) 100 MG tablet Take 1 tablet (100 mg total) by mouth daily after breakfast. 90 tablet 1  . SUMAtriptan (IMITREX) 100 MG tablet TAKE 1 TAB IMMEDIATELY AT THE ONSET OF HEADACHE,MAY REPEAT IN 2 HOURS FOR PERSISTENT HEADACHE PAIN  5  . diclofenac sodium (VOLTAREN) 1 % GEL Apply 4 g topically 4 (four) times daily. Right posterior tibial tendon 3 Tube 2  . ibuprofen (ADVIL,MOTRIN) 200 MG tablet Take 200 mg by mouth every  6 (six) hours as needed.     Current Facility-Administered Medications  Medication Dose Route Frequency Provider Last Rate Last Dose  . levonorgestrel (MIRENA) 20 MCG/24HR IUD   Intrauterine Once Terrance Mass, MD        Allergies:  Allergies  Allergen Reactions  . Sulfa Antibiotics Hives  . Tylox [Oxycodone-Acetaminophen] Nausea And Vomiting  . Clindamycin/Lincomycin Rash    Past Surgical History:  Procedure Laterality Date  . COMBINED HYSTEROSCOPY DIAGNOSTIC / D&C    .  DIAGNOSTIC LAPAROSCOPY  1993   with laser adhesions  . INTRAUTERINE DEVICE INSERTION     mirena-Inserted 05-16-14  . KNEE SURGERY    . OOPHORECTOMY  2008   left  . ROTATOR CUFF REPAIR    . trichomoniasis  01/2013  . WRIST SURGERY     gang. cyst    Social History   Socioeconomic History  . Marital status: Married    Spouse name: Not on file  . Number of children: Not on file  . Years of education: Not on file  . Highest education level: Not on file  Occupational History  . Not on file  Social Needs  . Financial resource strain: Not on file  . Food insecurity:    Worry: Not on file    Inability: Not on file  . Transportation needs:    Medical: Not on file    Non-medical: Not on file  Tobacco Use  . Smoking status: Never Smoker  . Smokeless tobacco: Never Used  Substance and Sexual Activity  . Alcohol use: Yes    Alcohol/week: 0.0 standard drinks    Comment: rare  . Drug use: No  . Sexual activity: Not Currently    Birth control/protection: IUD    Comment: Mirena inserted 05-16-14.1st intercourse- 19, partners- 5  Lifestyle  . Physical activity:    Days per week: Not on file    Minutes per session: Not on file  . Stress: Not on file  Relationships  . Social connections:    Talks on phone: Not on file    Gets together: Not on file    Attends religious service: Not on file    Active member of club or organization: Not on file    Attends meetings of clubs or organizations: Not on file    Relationship status: Not on file  Other Topics Concern  . Not on file  Social History Narrative  . Not on file    Family History  Problem Relation Age of Onset  . Hypertension Mother   . Diabetes Mother   . Hypertension Father   . Diabetes Sister   . Hypertension Brother   . Diabetes Maternal Grandfather      ROS Review of Systems See HPI Constitution: No fevers or chills No malaise No diaphoresis Skin: No rash or itching Eyes: no blurry vision, no double  vision GU: no dysuria or hematuria Neuro: no dizziness or headaches all others reviewed and negative   Objective: Vitals:   11/10/17 0848  BP: 127/88  Pulse: 93  Resp: 16  Temp: 98.8 F (37.1 C)  TempSrc: Oral  SpO2: 97%  Weight: 194 lb 6.4 oz (88.2 kg)  Height: 4\' 11"  (1.499 m)  Body mass index is 39.26 kg/m.  Diabetic Foot Exam - Simple   Simple Foot Form Diabetic Foot exam was performed with the following findings:  Yes 11/10/2017  8:55 AM  Visual Inspection No deformities, no ulcerations, no other skin breakdown bilaterally:  Yes Sensation Testing Intact to touch and monofilament testing bilaterally:  Yes Pulse Check Posterior Tibialis and Dorsalis pulse intact bilaterally:  Yes Comments      Physical Exam  Constitutional: She is oriented to person, place, and time. She appears well-developed and well-nourished.  HENT:  Head: Normocephalic and atraumatic.  Nose: Nose normal.  Mouth/Throat: Oropharynx is clear and moist.  Eyes: Conjunctivae and EOM are normal.  Neck: Normal range of motion. Neck supple. No thyromegaly present.  Cardiovascular: Normal rate, regular rhythm and normal heart sounds.  No murmur heard. Pulmonary/Chest: Effort normal and breath sounds normal. No stridor. No respiratory distress. She has no wheezes.  Abdominal: Soft. Bowel sounds are normal. She exhibits no distension. There is no tenderness.  Musculoskeletal: Normal range of motion. She exhibits no edema.  Neurological: She is alert and oriented to person, place, and time.  Skin: Skin is warm. Capillary refill takes less than 2 seconds.  Psychiatric: She has a normal mood and affect. Her behavior is normal. Judgment and thought content normal.     Assessment and Plan Allysia was seen today for new patient (initial visit) and medication refill.  Diagnoses and all orders for this visit:  Type 2 diabetes mellitus with complication, unspecified whether long term insulin use ([place])-  discussed diabetes mgmt Reviewed vaccines Discussed standard of care -     POCT glycosylated hemoglobin (Hb A1C) -     HM Diabetes Foot Exam -     Flu Vaccine QUAD 36+ mos IM -     Ambulatory referral to diabetic education -     TSH  Encounter for screening colonoscopy -     Ambulatory referral to Gastroenterology  Benign hypertension- bp at goal for diabetes mgmt -     Comprehensive metabolic panel -     TSH  Dyslipidemia- will assess levels Tolerating crestor -     Lipid panel -     TSH  Class 2 severe obesity due to excess calories with serious comorbidity and body mass index (BMI) of 39.0 to 39.9 in adult [facility])- discussed weight mgmt   Other orders -     furosemide (LASIX) 20 MG tablet; Take 1 tablet (20 mg total) by mouth 2 (two) times daily as needed (fluid). -     sitaGLIPtin (JANUVIA) 100 MG tablet; Take 1 tablet (100 mg total) by mouth daily after breakfast.       Serrina Minogue A Nolon Rod

## 2017-11-10 NOTE — Patient Instructions (Addendum)
     If you have lab work done today you will be contacted with your lab results within the next 2 weeks.  If you have not heard from us then please contact us. The fastest way to get your results is to register for My Chart.   IF you received an x-ray today, you will receive an invoice from [facility]. Please contact [facility] at [phone] with questions or concerns regarding your invoice.   IF you received labwork today, you will receive an invoice from [facility]. Please contact [facility] at [phone] with questions or concerns regarding your invoice.   Our billing staff will not be able to assist you with questions regarding bills from these companies.  You will be contacted with the lab results as soon as they are available. The fastest way to get your results is to activate your My Chart account. Instructions are located on the last page of this paperwork. If you have not heard from us regarding the results in 2 weeks, please contact this office.     Colorectal Cancer Screening Colorectal cancer screening is a group of tests used to check for colorectal cancer. Colorectal refers to your colon and rectum. Your colon and rectum are located at the end of your large intestine and carry your bowel movements out of your body. Why is colorectal cancer screening done? It is common for abnormal growths (polyps) to form in the lining of your colon, especially as you get older. These polyps can be cancerous or become cancerous. If colorectal cancer is found at an early stage, it is treatable. Who should be screened for colorectal cancer? Screening is recommended for all adults at average risk starting at age 50. Tests may be recommended every 1 to 10 years. Your health care provider may recommend earlier or more frequent screening if you have:  A history of colorectal cancer or polyps.  A family member with a history of colorectal cancer or polyps.  Inflammatory bowel  disease, such as ulcerative colitis or Crohn disease.  A type of hereditary colon cancer syndrome.  Colorectal cancer symptoms.  Types of screening tests There are several types of colorectal screening tests. They include:  Guaiac-based fecal occult blood testing.  Fecal immunochemical test (FIT).  Stool DNA test.  Barium enema.  Virtual colonoscopy.  Sigmoidoscopy. During this test, a sigmoidoscope is used to examine your rectum and lower colon. A sigmoidoscope is a flexible tube with a camera that is inserted through your anus into your rectum and lower colon.  Colonoscopy. During this test, a colonoscope is used to examine your entire colon. A colonoscope is a long, thin, flexible tube with a camera. This test examines your entire colon and rectum.  This information is not intended to replace advice given to you by your health care provider. Make sure you discuss any questions you have with your health care provider. Document Released: 08/07/2009 Document Revised: 09/27/2015 Document Reviewed: 05/26/2013 Elsevier Interactive Patient Education  2018 Elsevier Inc.  

## 2017-11-11 LAB — LIPID PANEL
CHOL/HDL RATIO: 2.4 ratio (ref 0.0–4.4)
Cholesterol, Total: 158 mg/dL (ref 100–199)
HDL: 66 mg/dL (ref >39)
LDL Calculated: 73 mg/dL (ref 0–99)
TRIGLYCERIDES: 93 mg/dL (ref 0–149)
VLDL Cholesterol Cal: 19 mg/dL (ref 5–40)

## 2017-11-11 LAB — COMPREHENSIVE METABOLIC PANEL
ALT: 6 IU/L (ref 0–32)
AST: 14 IU/L (ref 0–40)
Albumin/Globulin Ratio: 1.4 (ref 1.2–2.2)
Albumin: 4.4 g/dL (ref 3.5–5.5)
Alkaline Phosphatase: 61 IU/L (ref 39–117)
BUN/Creatinine Ratio: 12 (ref 9–23)
BUN: 9 mg/dL (ref 6–24)
Bilirubin Total: 0.4 mg/dL (ref 0.0–1.2)
CALCIUM: 9.8 mg/dL (ref 8.7–10.2)
CO2: 25 mmol/L (ref 20–29)
CREATININE: 0.75 mg/dL (ref 0.57–1.00)
Chloride: 97 mmol/L (ref 96–106)
GFR calc non Af Amer: 93 mL/min/{1.73_m2} (ref >59)
GFR, EST AFRICAN AMERICAN: 107 mL/min/{1.73_m2} (ref >59)
Globulin, Total: 3.1 g/dL (ref 1.5–4.5)
Glucose: 107 mg/dL — ABNORMAL HIGH (ref 65–99)
Potassium: 4 mmol/L (ref 3.5–5.2)
Sodium: 141 mmol/L (ref 134–144)
TOTAL PROTEIN: 7.5 g/dL (ref 6.0–8.5)

## 2017-11-11 LAB — TSH: TSH: 1.24 u[IU]/mL (ref 0.450–4.500)

## 2017-11-13 ENCOUNTER — Encounter: Payer: Self-pay | Admitting: Family Medicine

## 2017-11-17 ENCOUNTER — Encounter: Payer: Self-pay | Admitting: Gastroenterology

## 2017-11-30 ENCOUNTER — Other Ambulatory Visit: Payer: Self-pay | Admitting: *Deleted

## 2017-11-30 ENCOUNTER — Telehealth: Payer: Self-pay | Admitting: Family Medicine

## 2017-11-30 MED ORDER — OLMESARTAN MEDOXOMIL-HCTZ 40-12.5 MG PO TABS: 1.0000 | ORAL_TABLET | Freq: Every day | ORAL | 0 refills | Status: DC

## 2017-11-30 MED ORDER — ROSUVASTATIN CALCIUM 10 MG PO TABS: 10.0000 mg | ORAL_TABLET | Freq: Every day | ORAL | 0 refills | Status: DC

## 2017-11-30 NOTE — Telephone Encounter (Signed)
Copied from [place] [id number]. Topic: Quick Communication - Rx Refill/Question >> Nov 30, 2017  2:42 PM Chauncey Mann A wrote: Medication: olmesartan-hydrochlorothiazide (BENICAR HCT) 40-12.5 MG per tablet rosuvastatin (CRESTOR) 10 MG tablet  Has the patient contacted their pharmacy? Yes.   (Agent: If no, request that the patient contact the pharmacy for the refill.) (Agent: If yes, when and what did the pharmacy advise?)  Preferred Pharmacy (with phone number or street name): [id number] - [place], [place] [phone] (Phone) [phone] (Fax)    Agent: Please be advised that RX refills may take up to 3 business days. We ask that you follow-up with your pharmacy.

## 2017-12-01 ENCOUNTER — Other Ambulatory Visit (INDEPENDENT_AMBULATORY_CARE_PROVIDER_SITE_OTHER): Payer: Self-pay | Admitting: Physician Assistant

## 2017-12-17 ENCOUNTER — Ambulatory Visit (AMBULATORY_SURGERY_CENTER): Payer: Self-pay

## 2017-12-17 ENCOUNTER — Other Ambulatory Visit: Payer: Self-pay

## 2017-12-17 ENCOUNTER — Encounter: Payer: Self-pay | Admitting: Gastroenterology

## 2017-12-17 VITALS — Ht 59.0 in | Wt 199.8 lb

## 2017-12-17 DIAGNOSIS — Z1211 Encounter for screening for malignant neoplasm of colon: Secondary | ICD-10-CM

## 2017-12-17 MED ORDER — NA SULFATE-K SULFATE-MG SULF 17.5-3.13-1.6 GM/177ML PO SOLN: 1.0000 | Freq: Once | ORAL | 0 refills | Status: AC

## 2017-12-17 NOTE — Progress Notes (Signed)
No egg or soy allergy known to patient  No issues with past sedation with any surgeries  or procedures, no intubation problems  No diet pills per patient No home 02 use per patient  No blood thinners per patient  Pt denies issues with constipation  No A fib or A flutter  EMMI video sent to pt's e mail , pt declined    

## 2017-12-22 ENCOUNTER — Other Ambulatory Visit: Payer: Self-pay | Admitting: Family Medicine

## 2017-12-22 NOTE — Telephone Encounter (Signed)
Requested Prescriptions  Pending Prescriptions Disp Refills  . rosuvastatin (CRESTOR) 10 MG tablet [Pharmacy Med Name: ROSUVASTATIN CALCIUM 10 MG TAB] 90 tablet 0    Sig: TAKE 1 TABLET (10 MG TOTAL) BY MOUTH DAILY AFTER BREAKFAST.     Cardiovascular:  Antilipid - Statins Passed - 12/22/2017  9:34 AM      Passed - Total Cholesterol in normal range and within 360 days    Cholesterol, Total  Date Value Ref Range Status  11/10/2017 158 100 - 199 mg/dL Final         Passed - LDL in normal range and within 360 days    LDL Calculated  Date Value Ref Range Status  11/10/2017 73 0 - 99 mg/dL Final         Passed - HDL in normal range and within 360 days    HDL  Date Value Ref Range Status  11/10/2017 66 >39 mg/dL Final         Passed - Triglycerides in normal range and within 360 days    Triglycerides  Date Value Ref Range Status  11/10/2017 93 0 - 149 mg/dL Final         Passed - Patient is not pregnant      Passed - Valid encounter within last 12 months    Recent Outpatient Visits          1 month ago Type 2 diabetes mellitus with complication, unspecified whether long term insulin use ([place])   [facility], MD      Future Appointments            In 4 months Forrest Moron, MD [facility], [facility]

## 2017-12-24 ENCOUNTER — Other Ambulatory Visit: Payer: Self-pay | Admitting: Family Medicine

## 2017-12-25 ENCOUNTER — Ambulatory Visit (AMBULATORY_SURGERY_CENTER): Payer: 59 | Admitting: Gastroenterology

## 2017-12-25 ENCOUNTER — Encounter: Payer: Self-pay | Admitting: Gastroenterology

## 2017-12-25 VITALS — BP 117/72 | HR 93 | Temp 97.3°F | Resp 14 | Ht 59.0 in | Wt 194.0 lb

## 2017-12-25 DIAGNOSIS — Z1211 Encounter for screening for malignant neoplasm of colon: Secondary | ICD-10-CM | POA: Diagnosis not present

## 2017-12-25 MED ORDER — SODIUM CHLORIDE 0.9 % IV SOLN: 500.0000 mL | Freq: Once | INTRAVENOUS | Status: DC

## 2017-12-25 NOTE — Progress Notes (Signed)
Pt's states no medical or surgical changes since previsit or office visit. 

## 2017-12-25 NOTE — Patient Instructions (Signed)
YOU HAD AN ENDOSCOPIC PROCEDURE TODAY AT [facility]:   Refer to the procedure report that was given to you for any specific questions about what was found during the examination.  If the procedure report does not answer your questions, please call your gastroenterologist to clarify.  If you requested that your care partner not be given the details of your procedure findings, then the procedure report has been included in a sealed envelope for you to review at your convenience later.  YOU SHOULD EXPECT: Some feelings of bloating in the abdomen. Passage of more gas than usual.  Walking can help get rid of the air that was put into your GI tract during the procedure and reduce the bloating. If you had a lower endoscopy (such as a colonoscopy or flexible sigmoidoscopy) you may notice spotting of blood in your stool or on the toilet paper. If you underwent a bowel prep for your procedure, you may not have a normal bowel movement for a few days.  Please Note:  You might notice some irritation and congestion in your nose or some drainage.  This is from the oxygen used during your procedure.  There is no need for concern and it should clear up in a day or so.  SYMPTOMS TO REPORT IMMEDIATELY:   Following lower endoscopy (colonoscopy or flexible sigmoidoscopy):  Excessive amounts of blood in the stool  Significant tenderness or worsening of abdominal pains  Swelling of the abdomen that is new, acute  Fever of 100F or higher   Following upper endoscopy (EGD)  Vomiting of blood or coffee ground material  New chest pain or pain under the shoulder blades  Painful or persistently difficult swallowing  New shortness of breath  Fever of 100F or higher  Black, tarry-looking stools  For urgent or emergent issues, a gastroenterologist can be reached at any hour by calling [phone].   DIET:  We do recommend a small meal at first, but then you may proceed to your regular diet.  Drink  plenty of fluids but you should avoid alcoholic beverages for 24 hours.  ACTIVITY:  You should plan to take it easy for the rest of today and you should NOT DRIVE or use heavy machinery until tomorrow (because of the sedation medicines used during the test).    FOLLOW UP: Our staff will call the number listed on your records the next business day following your procedure to check on you and address any questions or concerns that you may have regarding the information given to you following your procedure. If we do not reach you, we will leave a message.  However, if you are feeling well and you are not experiencing any problems, there is no need to return our call.  We will assume that you have returned to your regular daily activities without incident.  If any biopsies were taken you will be contacted by phone or by letter within the next 1-3 weeks.  Please call us at [phone] if you have not heard about the biopsies in 3 weeks.    SIGNATURES/CONFIDENTIALITY: You and/or your care partner have signed paperwork which will be entered into your electronic medical record.  These signatures attest to the fact that that the information above on your After Visit Summary has been reviewed and is understood.  Full responsibility of the confidentiality of this discharge information lies with you and/or your care-partner.  Diverticulosis information given.  Recall colonoscopy 10 years 2029YOU HAD AN  ENDOSCOPIC PROCEDURE TODAY AT [facility]:   Refer to the procedure report that was given to you for any specific questions about what was found during the examination.  If the procedure report does not answer your questions, please call your gastroenterologist to clarify.  If you requested that your care partner not be given the details of your procedure findings, then the procedure report has been included in a sealed envelope for you to review at your convenience later.  YOU SHOULD EXPECT:  Some feelings of bloating in the abdomen. Passage of more gas than usual.  Walking can help get rid of the air that was put into your GI tract during the procedure and reduce the bloating. If you had a lower endoscopy (such as a colonoscopy or flexible sigmoidoscopy) you may notice spotting of blood in your stool or on the toilet paper. If you underwent a bowel prep for your procedure, you may not have a normal bowel movement for a few days.  Please Note:  You might notice some irritation and congestion in your nose or some drainage.  This is from the oxygen used during your procedure.  There is no need for concern and it should clear up in a day or so.  SYMPTOMS TO REPORT IMMEDIATELY:   Following lower endoscopy (colonoscopy or flexible sigmoidoscopy):  Excessive amounts of blood in the stool  Significant tenderness or worsening of abdominal pains  Swelling of the abdomen that is new, acute  Fever of 100F or higher   Following upper endoscopy (EGD)  Vomiting of blood or coffee ground material  New chest pain or pain under the shoulder blades  Painful or persistently difficult swallowing  New shortness of breath  Fever of 100F or higher  Black, tarry-looking stools  For urgent or emergent issues, a gastroenterologist can be reached at any hour by calling [phone].   DIET:  We do recommend a small meal at first, but then you may proceed to your regular diet.  Drink plenty of fluids but you should avoid alcoholic beverages for 24 hours.  ACTIVITY:  You should plan to take it easy for the rest of today and you should NOT DRIVE or use heavy machinery until tomorrow (because of the sedation medicines used during the test).    FOLLOW UP: Our staff will call the number listed on your records the next business day following your procedure to check on you and address any questions or concerns that you may have regarding the information given to you following your procedure. If we do not  reach you, we will leave a message.  However, if you are feeling well and you are not experiencing any problems, there is no need to return our call.  We will assume that you have returned to your regular daily activities without incident.  If any biopsies were taken you will be contacted by phone or by letter within the next 1-3 weeks.  Please call us at [phone] if you have not heard about the biopsies in 3 weeks.    SIGNATURES/CONFIDENTIALITY: You and/or your care partner have signed paperwork which will be entered into your electronic medical record.  These signatures attest to the fact that that the information above on your After Visit Summary has been reviewed and is understood.  Full responsibility of the confidentiality of this discharge information lies with you and/or your care-partner.

## 2017-12-25 NOTE — Op Note (Signed)
[facility] Patient Name: [name] Procedure Date: 12/25/2017 2:03 PM MRN: [id number] Endoscopist: Gerrit Heck , MD Age: 52 Referring MD:  Date of Birth: [birth year] Gender: Female Account #: [id number] Procedure:                Colonoscopy Indications:              Screening for colorectal malignant neoplasm, This                            is the patient's first colonoscopy Medicines:                Monitored Anesthesia Care Procedure:                Pre-Anesthesia Assessment:                           - Prior to the procedure, a History and Physical                            was performed, and patient medications and                            allergies were reviewed. The patient's tolerance of                            previous anesthesia was also reviewed. The risks                            and benefits of the procedure and the sedation                            options and risks were discussed with the patient.                            All questions were answered, and informed consent                            was obtained. Prior Anticoagulants: The patient has                            taken aspirin, last dose was 1 day prior to                            procedure. ASA Grade Assessment: II - A patient                            with mild systemic disease. After reviewing the                            risks and benefits, the patient was deemed in                            satisfactory condition to undergo the procedure.  After obtaining informed consent, the colonoscope                            was passed under direct vision. Throughout the                            procedure, the patient's blood pressure, pulse, and                            oxygen saturations were monitored continuously. The                            Colonoscope was introduced through the anus and                            advanced to the the  cecum, identified by                            appendiceal orifice and ileocecal valve. The                            colonoscopy was performed without difficulty. The                            patient tolerated the procedure well. The quality                            of the bowel preparation was adequate. Scope In: 2:19:01 PM Scope Out: 2:30:07 PM Scope Withdrawal Time: 0 hours 7 minutes 53 seconds  Total Procedure Duration: 0 hours 11 minutes 6 seconds  Findings:                 The perianal and digital rectal examinations were                            normal.                           A few small-mouthed diverticula were found in the                            sigmoid colon.                           The exam was otherwise normal throughout the                            examined colon.                           The retroflexed view of the distal rectum and anal                            verge was normal and showed no anal or rectal  abnormalities. Complications:            No immediate complications. Estimated Blood Loss:     Estimated blood loss: none. Impression:               - Diverticulosis in the sigmoid colon. Otherwise,                            normal colonoscopy.                           - No specimens collected. Recommendation:           - Patient has a contact number available for                            emergencies. The signs and symptoms of potential                            delayed complications were discussed with the                            patient. Return to normal activities tomorrow.                            Written discharge instructions were provided to the                            patient.                           - Resume previous diet.                           - Continue present medications.                           - Repeat colonoscopy in 10 years for screening                            purposes.                            - Return to GI office PRN. Gerrit Heck, MD 12/25/2017 2:34:19 PM

## 2017-12-25 NOTE — Progress Notes (Signed)
Report to PACU, RN, vss, BBS= Clear.  

## 2017-12-28 ENCOUNTER — Telehealth: Payer: Self-pay

## 2017-12-28 NOTE — Telephone Encounter (Signed)
  Follow up Call-  Call back number 12/25/2017  Post procedure Call Back phone  # [phone]  Permission to leave phone message Yes  Some recent data might be hidden     Patient questions:  Do you have a fever, pain , or abdominal swelling? No. Pain Score  0 *  Have you tolerated food without any problems? Yes.    Have you been able to return to your normal activities? Yes.  Do you have any questions about your discharge instructions: Diet   No. Medications  No. Follow up visit  No.  Do you have questions or concerns about your Care? No.  Actions: * If pain score is 4 or above: No action needed, pain <4.  No problems noted per pt.  maw

## 2017-12-28 NOTE — Telephone Encounter (Signed)
Called [phone] and left a messaged we tried to reach pt for a follow up call. maw

## 2017-12-31 ENCOUNTER — Ambulatory Visit: Payer: 59 | Admitting: Skilled Nursing Facility1

## 2018-01-01 ENCOUNTER — Other Ambulatory Visit: Payer: Self-pay | Admitting: Family Medicine

## 2018-01-01 NOTE — Telephone Encounter (Signed)
Requested medication (s) are due for refill today: yes  Requested medication (s) are on the active medication list: yesd  Last refill:  Filled by historical provider.   Future visit scheduled: yes 05/03/18  Notes to clinic:  Unable to fill due to medication being prescribed by a different provider previously. LOV on 11/10/17 with Dr. Nolon Rod. Pt states she is out of medication.     Requested Prescriptions  Pending Prescriptions Disp Refills   metFORMIN (GLUCOPHAGE) 500 MG tablet 90 tablet 0    Sig: Take 1 tablet (500 mg total) by mouth daily with breakfast.     Endocrinology:  Diabetes - Biguanides Passed - 01/01/2018 11:34 AM      Passed - Cr in normal range and within 360 days    Creatinine, Ser  Date Value Ref Range Status  11/10/2017 0.75 0.57 - 1.00 mg/dL Final         Passed - HBA1C is between 0 and 7.9 and within 180 days    Hemoglobin A1C  Date Value Ref Range Status  11/10/2017 7.0 (A) 4.0 - 5.6 % Final         Passed - eGFR in normal range and within 360 days    GFR calc Af Amer  Date Value Ref Range Status  11/10/2017 107 >59 mL/min/1.73 Final   GFR calc non Af Amer  Date Value Ref Range Status  11/10/2017 93 >59 mL/min/1.73 Final         Passed - Valid encounter within last 6 months    Recent Outpatient Visits          1 month ago Type 2 diabetes mellitus with complication, unspecified whether long term insulin use (HCC)   [facility] [place], MD      Future Appointments            In 4 months Forrest Moron, MD [facility] at [place], [facility]

## 2018-01-01 NOTE — Telephone Encounter (Signed)
Copied from [place] [id number]. Topic: Quick Communication - Rx Refill/Question >> Jan 01, 2018 11:19 AM Keene Breath wrote: Medication: metFORMIN (GLUCOPHAGE) 500 MG tablet  Patient called to request a refill for the above medication.  Patient is all out and needs asap.  CB# [phone]  Preferred Pharmacy (with phone number or street name): [id number] - [place], [place] [phone] (Phone) [phone] (Fax)

## 2018-01-02 MED ORDER — METFORMIN HCL 500 MG PO TABS: 500.0000 mg | ORAL_TABLET | Freq: Every day | ORAL | 0 refills | Status: DC

## 2018-01-04 ENCOUNTER — Ambulatory Visit: Payer: 59 | Admitting: Registered"

## 2018-01-06 ENCOUNTER — Telehealth: Payer: Self-pay | Admitting: Family Medicine

## 2018-01-06 NOTE — Telephone Encounter (Signed)
Patient was advise we do not have samples Advise patient of Good rx and pulled up prescriptions gave prices to patient she will call back tonight once she does some research to let us know what pharmacy to send prescription to.

## 2018-01-06 NOTE — Telephone Encounter (Signed)
Clicked signed in error.  Forwarding to office.

## 2018-01-06 NOTE — Telephone Encounter (Signed)
Copied from [place] [id number]. Topic: Quick Communication - Rx Refill/Question >> Jan 06, 2018 11:05 AM Reyne Dumas L wrote: Medication:  olmesartan-hydrochlorothiazide (BENICAR HCT) 40-12.5 MG tablet rosuvastatin (CRESTOR) 10 MG tablet metFORMIN (GLUCOPHAGE) 500 MG tablet  Pt wants to know if she can get samples of these medication from [place].  Pt states that it is too expensive for her, states she is on [employer]. Pt can be reached at [phone]

## 2018-01-07 ENCOUNTER — Other Ambulatory Visit: Payer: Self-pay | Admitting: Family Medicine

## 2018-01-07 MED ORDER — ROSUVASTATIN CALCIUM 10 MG PO TABS: 10.0000 mg | ORAL_TABLET | Freq: Every day | ORAL | 0 refills | Status: DC

## 2018-01-07 MED ORDER — OLMESARTAN MEDOXOMIL-HCTZ 40-12.5 MG PO TABS: 1.0000 | ORAL_TABLET | Freq: Every day | ORAL | 0 refills | Status: DC

## 2018-01-07 NOTE — Telephone Encounter (Signed)
Pt requesting pharmacy change

## 2018-01-07 NOTE — Telephone Encounter (Signed)
Copied from [place] [phone]. Topic: Quick Communication - Rx Refill/Question >> Jan 07, 2018  2:27 PM Leward Quan A wrote: Medication: rosuvastatin (CRESTOR) 10 MG tablet , olmesartan-hydrochlorothiazide (BENICAR HCT) 40-12.5 MG tablet   Patient say she is all out of medication  Has the patient contacted their pharmacy? Yes.     Preferred Pharmacy (with phone number or street name): [place], [place] - [address] [phone] (Phone) [phone] (Fax)    Agent: Please be advised that RX refills may take up to 3 business days. We ask that you follow-up with your pharmacy.

## 2018-01-15 ENCOUNTER — Other Ambulatory Visit: Payer: Self-pay | Admitting: Family Medicine

## 2018-01-15 NOTE — Telephone Encounter (Signed)
Copied from [place] [id number]. Topic: Quick Communication - Rx Refill/Question >> Jan 15, 2018 10:21 AM Keene Breath wrote: Medication: metFORMIN (GLUCOPHAGE) 500 MG tablet  Patient called to request a refill for the above medication  Preferred Pharmacy (with phone number or street name): [id number] [place], [place] [phone] (Phone) [phone] (Fax)

## 2018-03-17 ENCOUNTER — Ambulatory Visit (HOSPITAL_COMMUNITY)
Admission: EM | Admit: 2018-03-17 | Discharge: 2018-03-17 | Disposition: A | Discharge status: Home / Self Care | Payer: 59 | Attending: Family Medicine | Admitting: Family Medicine

## 2018-03-17 ENCOUNTER — Encounter (HOSPITAL_COMMUNITY): Payer: Self-pay | Admitting: Emergency Medicine

## 2018-03-17 ENCOUNTER — Ambulatory Visit: Payer: 59 | Admitting: Family Medicine

## 2018-03-17 DIAGNOSIS — J069 Acute upper respiratory infection, unspecified: Secondary | ICD-10-CM | POA: Insufficient documentation

## 2018-03-17 MED ORDER — CETIRIZINE HCL 10 MG PO CHEW: 10.0000 mg | CHEWABLE_TABLET | Freq: Every day | ORAL | 0 refills | Status: DC

## 2018-03-17 MED ORDER — OXYMETAZOLINE HCL 0.05 % NA SOLN: 1.0000 | Freq: Two times a day (BID) | NASAL | 0 refills | Status: AC

## 2018-03-17 MED ORDER — FLUTICASONE PROPIONATE 50 MCG/ACT NA SUSP: 2.0000 | Freq: Every day | NASAL | 0 refills | Status: DC

## 2018-03-17 NOTE — Discharge Instructions (Signed)
Get plenty of rest and push fluids Zyrtec prescribed for nasal congestion, runny nose, and/or sore throat Flonase prescribed for nasal congestion and runny nose Afrin prescribed.  Use as directed for 3 days Use OTC medications like ibuprofen or tylenol as needed fever or pain Follow up with PCP if symptoms persist Return or go to ER if you have any new or worsening symptoms fever, chills, nausea, vomiting, chest pain, cough, shortness of breath, wheezing, abdominal pain, changes in bowel or bladder habits, etc..Marland Kitchen

## 2018-03-17 NOTE — ED Provider Notes (Signed)
[facility]   [id number] 03/17/18 Arrival Time: 0945   CC: URI symptoms   SUBJECTIVE: History from: patient.  [name] is a 53 y.o. female who presents with abrupt onset of sinus pain/ pressure, nasal congestion, sore throat and fatigue that began 3 days ago.  Admits to sick exposure at work to coworkers with similar symptoms.  Has tried OTC medications without relief.   Reports previous symptoms in the past.   Denies fever, chills, SOB, wheezing, chest pain, nausea, changes in bowel or bladder habits.    ROS: As per HPI.  Past Medical History:  Diagnosis Date  . Arthritis   . Diabetes mellitus   . Elevated cholesterol   . Endometrial polyp   . Endometriosis   . Hypertension   . Ovarian cyst    Past Surgical History:  Procedure Laterality Date  . COMBINED HYSTEROSCOPY DIAGNOSTIC / D&C    . DIAGNOSTIC LAPAROSCOPY  1993   with laser adhesions  . INTRAUTERINE DEVICE INSERTION     mirena-Inserted 05-16-14  . KNEE SURGERY    . OOPHORECTOMY  2008   left  . ROTATOR CUFF REPAIR    . trichomoniasis  01/2013  . WRIST SURGERY     gang. cyst   Allergies  Allergen Reactions  . Dilaudid [Hydromorphone Hcl] Other (See Comments)    Broke in sweat and started shaking, can take oral  . Sulfa Antibiotics Hives  . Tylox [Oxycodone-Acetaminophen] Nausea And Vomiting  . Clindamycin/Lincomycin Rash   Current Facility-Administered Medications on File Prior to Encounter  Medication Dose Route Frequency Provider Last Rate Last Dose  . levonorgestrel (MIRENA) 20 MCG/24HR IUD   Intrauterine Once Terrance Mass, MD       Current Outpatient Medications on File Prior to Encounter  Medication Sig Dispense Refill  . aspirin EC 81 MG EC tablet Take 1 tablet (81 mg total) by mouth daily.    Marland Kitchen estradiol (ESTRACE) 1 MG tablet Take 1 tablet (1 mg total) by mouth daily. 90 tablet 3  . furosemide (LASIX) 20 MG tablet Take 1 tablet (20 mg total) by mouth 2 (two) times daily as  needed (fluid). 30 tablet 3  . ibuprofen (ADVIL,MOTRIN) 200 MG tablet Take 200 mg by mouth every 6 (six) hours as needed.    . metFORMIN (GLUCOPHAGE) 500 MG tablet Take 1 tablet (500 mg total) by mouth daily with breakfast. 90 tablet 0  . olmesartan-hydrochlorothiazide (BENICAR HCT) 40-12.5 MG tablet Take 1 tablet by mouth daily. 90 tablet 0  . Potassium 75 MG TABS Take 75 mg by mouth daily.     . rosuvastatin (CRESTOR) 10 MG tablet Take 1 tablet (10 mg total) by mouth daily after breakfast. 90 tablet 0  . sitaGLIPtin (JANUVIA) 100 MG tablet Take 1 tablet (100 mg total) by mouth daily after breakfast. 90 tablet 1  . diclofenac sodium (VOLTAREN) 1 % GEL APPLY 4 GRAMS TOPICALLY 4 (FOUR) TIMES DAILY. RIGHT POSTERIOR TIBIAL TENDON 900 g 0  . promethazine (PHENERGAN) 25 MG tablet TAKE 1 TABLET EVERY 6 HOURS AS NEEDED FOR NAUSEA  1  . SUMAtriptan (IMITREX) 100 MG tablet TAKE 1 TAB IMMEDIATELY AT THE ONSET OF HEADACHE,MAY REPEAT IN 2 HOURS FOR PERSISTENT HEADACHE PAIN  5   Social History   Socioeconomic History  . Marital status: Married    Spouse name: Not on file  . Number of children: Not on file  . Years of education: Not on file  . Highest education level:  Not on file  Occupational History  . Not on file  Social Needs  . Financial resource strain: Not on file  . Food insecurity:    Worry: Not on file    Inability: Not on file  . Transportation needs:    Medical: Not on file    Non-medical: Not on file  Tobacco Use  . Smoking status: Never Smoker  . Smokeless tobacco: Never Used  Substance and Sexual Activity  . Alcohol use: Yes    Alcohol/week: 0.0 standard drinks    Comment: rare  . Drug use: No  . Sexual activity: Not Currently    Birth control/protection: I.U.D.    Comment: Mirena inserted 05-16-14.1st intercourse- 19, partners- 5  Lifestyle  . Physical activity:    Days per week: Not on file    Minutes per session: Not on file  . Stress: Not on file  Relationships  .  Social connections:    Talks on phone: Not on file    Gets together: Not on file    Attends religious service: Not on file    Active member of club or organization: Not on file    Attends meetings of clubs or organizations: Not on file    Relationship status: Not on file  . Intimate partner violence:    Fear of current or ex partner: Not on file    Emotionally abused: Not on file    Physically abused: Not on file    Forced sexual activity: Not on file  Other Topics Concern  . Not on file  Social History Narrative  . Not on file   Family History  Problem Relation Age of Onset  . Hypertension Mother   . Diabetes Mother   . Hypertension Father   . Diabetes Sister   . Hypertension Brother   . Diabetes Maternal Grandfather   . Colon cancer Neg Hx   . Esophageal cancer Neg Hx   . Rectal cancer Neg Hx   . Stomach cancer Neg Hx     OBJECTIVE:  Vitals:   03/17/18 1026  BP: (!) 123/44  Pulse: 90  Temp: 98.4 F (36.9 C)  TempSrc: Temporal  SpO2: 99%     General appearance: alert; appears fatigued, but nontoxic; speaking in full sentences and tolerating own secretions HEENT: NCAT; Ears: EACs clear, TMs pearly gray; Eyes: PERRL.  EOM grossly intact. Sinuses: TTP; Nose: nares patent with mild rhinorrhea, turbinates appear swollen and erythematous, Throat: oropharynx injected, tonsils non erythematous or enlarged, uvula midline  Neck: supple with left sided cervical and submandibular LAD Lungs: unlabored respirations, symmetrical air entry; cough: absent; no respiratory distress; CTAB Heart: regular rate and rhythm.  Radial pulses 2+ symmetrical bilaterally Skin: warm and dry Psychological: alert and cooperative; normal mood and affect  ASSESSMENT & PLAN:  1. Viral URI     Meds ordered this encounter  Medications  . fluticasone (FLONASE) 50 MCG/ACT nasal spray    Sig: Place 2 sprays into both nostrils daily.    Dispense:  16 g    Refill:  0    Order Specific Question:    Supervising Provider    Answer:   Raylene Everts [[id number]]  . cetirizine (ZYRTEC) 10 MG chewable tablet    Sig: Chew 1 tablet (10 mg total) by mouth daily.    Dispense:  20 tablet    Refill:  0    Order Specific Question:   Supervising Provider    Answer:   Blanchie Serve  SUE [[id number]]  . oxymetazoline (AFRIN NASAL SPRAY) 0.05 % nasal spray    Sig: Place 1 spray into both nostrils 2 (two) times daily for 3 days. DO NOT USE AFTER 3 DAYS OR YOU MAY EXPERIENCE A REBOUND EFFECT    Dispense:  14.7 mL    Refill:  0    Order Specific Question:   Supervising Provider    Answer:   Raylene Everts [[id number]]    Get plenty of rest and push fluids Zyrtec prescribed for nasal congestion, runny nose, and/or sore throat Flonase prescribed for nasal congestion and runny nose Afrin prescribed.  Use as directed for 3 days Use OTC medications like ibuprofen or tylenol as needed fever or pain Follow up with PCP if symptoms persist Return or go to ER if you have any new or worsening symptoms fever, chills, nausea, vomiting, chest pain, cough, shortness of breath, wheezing, abdominal pain, changes in bowel or bladder habits, etc...  Reviewed expectations re: course of current medical issues. Questions answered. Outlined signs and symptoms indicating need for more acute intervention. Patient verbalized understanding. After Visit Summary given.         Lestine Box, PA-C 03/17/18 1104

## 2018-03-17 NOTE — ED Triage Notes (Signed)
Pt reports a sore throat, facial pressure and headache and mild cough since Sunday.  Pt has been gargling with warm salt water and Mucinex.

## 2018-04-08 ENCOUNTER — Other Ambulatory Visit: Payer: Self-pay | Admitting: Family Medicine

## 2018-04-08 MED ORDER — ROSUVASTATIN CALCIUM 10 MG PO TABS: 10.0000 mg | ORAL_TABLET | Freq: Every day | ORAL | 0 refills | Status: DC

## 2018-04-08 MED ORDER — OLMESARTAN MEDOXOMIL-HCTZ 40-12.5 MG PO TABS: 1.0000 | ORAL_TABLET | Freq: Every day | ORAL | 0 refills | Status: DC

## 2018-04-08 NOTE — Telephone Encounter (Signed)
Copied from [place] [id number]. Topic: Quick Communication - Rx Refill/Question >> Apr 08, 2018 10:32 AM Lionel December wrote: Medication: olmesartan-hydrochlorothiazide (BENICAR HCT) 40-12.5 MG tablet and  rosuvastatin (CRESTOR) 10 MG tablet   Has the patient contacted their pharmacy? Yes.   (Agent: If no, request that the patient contact the pharmacy for the refill.) (Agent: If yes, when and what did the pharmacy advise?)  Preferred Pharmacy (with phone number or street name): [id number] - [place], [place] [phone] (Phone) [phone] (Fax    Agent: Please be advised that RX refills may take up to 3 business days. We ask that you follow-up with your pharmacy.

## 2018-04-14 ENCOUNTER — Other Ambulatory Visit: Payer: Self-pay | Admitting: Family Medicine

## 2018-04-14 NOTE — Telephone Encounter (Signed)
Requested Prescriptions  Pending Prescriptions Disp Refills  . metFORMIN (GLUCOPHAGE) 500 MG tablet [Pharmacy Med Name: METFORMIN HCL 500 MG TABLET] 90 tablet 0    Sig: TAKE 1 TABLET BY MOUTH EVERY DAY WITH BREAKFAST     Endocrinology:  Diabetes - Biguanides Passed - 04/14/2018  9:32 AM      Passed - Cr in normal range and within 360 days    Creatinine, Ser  Date Value Ref Range Status  11/10/2017 0.75 0.57 - 1.00 mg/dL Final         Passed - HBA1C is between 0 and 7.9 and within 180 days    Hemoglobin A1C  Date Value Ref Range Status  11/10/2017 7.0 (A) 4.0 - 5.6 % Final         Passed - eGFR in normal range and within 360 days    GFR calc Af Amer  Date Value Ref Range Status  11/10/2017 107 >59 mL/min/1.73 Final   GFR calc non Af Amer  Date Value Ref Range Status  11/10/2017 93 >59 mL/min/1.73 Final         Passed - Valid encounter within last 6 months    Recent Outpatient Visits          5 months ago Type 2 diabetes mellitus with complication, unspecified whether long term insulin use (HCC)   Primary Care at [place], MD      Future Appointments            In 2 weeks Forrest Moron, MD Primary Care at [place], [facility]

## 2018-05-03 ENCOUNTER — Ambulatory Visit: Payer: 59 | Admitting: Family Medicine

## 2018-05-07 ENCOUNTER — Other Ambulatory Visit: Payer: Self-pay | Admitting: Family Medicine

## 2018-05-07 ENCOUNTER — Other Ambulatory Visit: Payer: Self-pay | Admitting: Gynecology

## 2018-05-07 DIAGNOSIS — Z1231 Encounter for screening mammogram for malignant neoplasm of breast: Secondary | ICD-10-CM

## 2018-05-29 ENCOUNTER — Other Ambulatory Visit: Payer: Self-pay | Admitting: Family Medicine

## 2018-06-17 ENCOUNTER — Telehealth (INDEPENDENT_AMBULATORY_CARE_PROVIDER_SITE_OTHER): Payer: BLUE CROSS/BLUE SHIELD | Admitting: Family Medicine

## 2018-06-17 ENCOUNTER — Other Ambulatory Visit: Payer: Self-pay

## 2018-06-17 DIAGNOSIS — E785 Hyperlipidemia, unspecified: Secondary | ICD-10-CM

## 2018-06-17 DIAGNOSIS — G43009 Migraine without aura, not intractable, without status migrainosus: Secondary | ICD-10-CM | POA: Diagnosis not present

## 2018-06-17 DIAGNOSIS — I10 Essential (primary) hypertension: Secondary | ICD-10-CM | POA: Diagnosis not present

## 2018-06-17 DIAGNOSIS — Z6839 Body mass index (BMI) 39.0-39.9, adult: Secondary | ICD-10-CM

## 2018-06-17 DIAGNOSIS — E1169 Type 2 diabetes mellitus with other specified complication: Secondary | ICD-10-CM

## 2018-06-17 MED ORDER — RIZATRIPTAN BENZOATE 10 MG PO TBDP: 10.0000 mg | ORAL_TABLET | ORAL | 0 refills | Status: DC | PRN

## 2018-06-17 MED ORDER — METFORMIN HCL 500 MG PO TABS: 500.0000 mg | ORAL_TABLET | Freq: Every day | ORAL | 0 refills | Status: DC

## 2018-06-17 MED ORDER — GABAPENTIN 300 MG PO CAPS: 300.0000 mg | ORAL_CAPSULE | Freq: Every day | ORAL | 3 refills | Status: DC

## 2018-06-17 MED ORDER — SITAGLIPTIN PHOSPHATE 100 MG PO TABS: ORAL_TABLET | ORAL | 0 refills | Status: DC

## 2018-06-17 MED ORDER — OLMESARTAN MEDOXOMIL-HCTZ 40-12.5 MG PO TABS: 1.0000 | ORAL_TABLET | Freq: Every day | ORAL | 0 refills | Status: DC

## 2018-06-17 NOTE — Patient Instructions (Signed)
° ° ° °  If you have lab work done today you will be contacted with your lab results within the next 2 weeks.  If you have not heard from us then please contact us. The fastest way to get your results is to register for My Chart. ° ° °IF you received an x-ray today, you will receive an invoice from [facility]. Please contact [facility] at [phone] with questions or concerns regarding your invoice.  ° °IF you received labwork today, you will receive an invoice from [facility]. Please contact [facility] at [phone] with questions or concerns regarding your invoice.  ° °Our billing staff will not be able to assist you with questions regarding bills from these companies. ° °You will be contacted with the lab results as soon as they are available. The fastest way to get your results is to activate your My Chart account. Instructions are located on the last page of this paperwork. If you have not heard from us regarding the results in 2 weeks, please contact this office. °  ° ° ° °

## 2018-06-17 NOTE — Progress Notes (Signed)
CC: follow up diabetes and htn.  Per pt she needs to talk with dr Nolon Rod about Januvia and sumatriptin as it causes he rhead to hurt more.  Pt c/o not being able to stay asleep at night. Per pt waking up at 1:30 am and not being able to go back to sleep until 4-4:30 am.  Per pt she can fall asleep but unable to stay asleep, pt admits to having a lot of stressors one being her mom was just dx with lung cancer.  Pt needs refills on olmesartan-hctz, metformin and rosuvastatin.  No travel outside the [place] or [place] in the last 3 weeks. Dgaddy, CMA

## 2018-06-17 NOTE — Progress Notes (Signed)
Telemedicine Encounter- SOAP NOTE Established Patient  This telephone encounter was conducted with the patient's (or proxy's) verbal consent via audio telecommunications: yes/no: Yes Patient was instructed to have this encounter in a suitably private space; and to only have persons present to whom they give permission to participate. In addition, patient identity was confirmed by use of name plus two identifiers (DOB and address).  I discussed the limitations, risks, security and privacy concerns of performing an evaluation and management service by telephone and the availability of in person appointments. I also discussed with the patient that there may be a patient responsible charge related to this service. The patient expressed understanding and agreed to proceed.  I spent a total of TIME; 0 MIN TO 60 MIN: 25 minutes talking with the patient or their proxy.  CC: HEADACHE  Subjective   [name] is a 53 y.o. established patient. Telephone visit today for  HPI  Headaches She reports that when she gets headaches the medication sumatriptan makes the headache worse She states that she typically gets unbearable 10/10 pain and causes nausea and vomiting That is when she takes phenergan which helps the nausea and vomiting She denies blurry vision Her headaches are on both sides with aura sometimes with pain behind the eyes  She reports that she gets headaches 3 times a month She has increased stress and is noticing increasing difficulty with sleeping She reports that she has difficulty with sleeping Her mother was recently diagnosed with lung cancer She has tried melatonin She has only taken one but does not stay asleep She is a nonsmoker She does not keep a phone in the bed with her  Hypertension She states that she is on the [facility] She is    She takes Januvia, metformin and crestor She checks her blood glucose today and it was 135 fasting She denies  polyuria, polydipsia, polyphagia Lab Results  Component Value Date   HGBA1C 7.0 (A) 11/10/2017       Patient Active Problem List   Diagnosis Date Noted  . Acute right-sided low back pain with right-sided sciatica 06/10/2017  . Trochanteric bursitis, right hip 05/27/2017  . Pain in right hip 05/27/2017  . Contusion, thigh and hip, left, sequela 01/08/2017  . Low back pain 01/08/2017  . IUD (intrauterine device) in place 05/16/2014  . Atypical chest pain 11/18/2012  . Injection site extravasation of IV contrast 11/18/2012  . DM (diabetes mellitus), type 2 ([place]) 11/18/2012  . Benign hypertension 11/18/2012  . Hypokalemia 11/18/2012  . Menopause 09/16/2011  . Elevated cholesterol     Past Medical History:  Diagnosis Date  . Arthritis   . Diabetes mellitus   . Elevated cholesterol   . Endometrial polyp   . Endometriosis   . Hypertension   . Ovarian cyst     Current Outpatient Medications  Medication Sig Dispense Refill  . aspirin EC 81 MG EC tablet Take 1 tablet (81 mg total) by mouth daily.    . diclofenac sodium (VOLTAREN) 1 % GEL APPLY 4 GRAMS TOPICALLY 4 (FOUR) TIMES DAILY. RIGHT POSTERIOR TIBIAL TENDON 900 g 0  . furosemide (LASIX) 20 MG tablet Take 1 tablet (20 mg total) by mouth 2 (two) times daily as needed (fluid). 30 tablet 3  . ibuprofen (ADVIL,MOTRIN) 200 MG tablet Take 200 mg by mouth every 6 (six) hours as needed.    . metFORMIN (GLUCOPHAGE) 500 MG tablet Take 1 tablet (500 mg total) by mouth daily with  breakfast. 90 tablet 0  . olmesartan-hydrochlorothiazide (BENICAR HCT) 40-12.5 MG tablet Take 1 tablet by mouth daily. 90 tablet 0  . Potassium 75 MG TABS Take 75 mg by mouth daily.     . promethazine (PHENERGAN) 25 MG tablet TAKE 1 TABLET EVERY 6 HOURS AS NEEDED FOR NAUSEA  1  . rosuvastatin (CRESTOR) 10 MG tablet Take 1 tablet (10 mg total) by mouth daily after breakfast. 90 tablet 0  . sitaGLIPtin (JANUVIA) 100 MG tablet TAKE 1 TABLET (100 MG TOTAL) BY MOUTH  DAILY AFTER BREAKFAST. 90 tablet 0  . fluticasone (FLONASE) 50 MCG/ACT nasal spray Place 2 sprays into both nostrils daily. (Patient not taking: Reported on 06/17/2018) 16 g 0  . gabapentin (NEURONTIN) 300 MG capsule Take 1 capsule (300 mg total) by mouth at bedtime. 10 capsule 3  . rizatriptan (MAXALT-MLT) 10 MG disintegrating tablet Take 1 tablet (10 mg total) by mouth as needed for migraine. May repeat in 2 hours if needed 10 tablet 0   Current Facility-Administered Medications  Medication Dose Route Frequency Provider Last Rate Last Dose  . levonorgestrel (MIRENA) 20 MCG/24HR IUD   Intrauterine Once Terrance Mass, MD        Allergies  Allergen Reactions  . Dilaudid [Hydromorphone Hcl] Other (See Comments)    Broke in sweat and started shaking, can take oral  . Sulfa Antibiotics Hives  . Tylox [Oxycodone-Acetaminophen] Nausea And Vomiting  . Clindamycin/Lincomycin Rash    Social History   Socioeconomic History  . Marital status: Married    Spouse name: Not on file  . Number of children: Not on file  . Years of education: Not on file  . Highest education level: Not on file  Occupational History  . Not on file  Social Needs  . Financial resource strain: Not on file  . Food insecurity:    Worry: Not on file    Inability: Not on file  . Transportation needs:    Medical: Not on file    Non-medical: Not on file  Tobacco Use  . Smoking status: Never Smoker  . Smokeless tobacco: Never Used  Substance and Sexual Activity  . Alcohol use: Yes    Alcohol/week: 0.0 standard drinks    Comment: rare  . Drug use: No  . Sexual activity: Not Currently    Birth control/protection: I.U.D.    Comment: Mirena inserted 05-16-14.1st intercourse- 19, partners- 5  Lifestyle  . Physical activity:    Days per week: Not on file    Minutes per session: Not on file  . Stress: Not on file  Relationships  . Social connections:    Talks on phone: Not on file    Gets together: Not on file     Attends religious service: Not on file    Active member of club or organization: Not on file    Attends meetings of clubs or organizations: Not on file    Relationship status: Not on file  . Intimate partner violence:    Fear of current or ex partner: Not on file    Emotionally abused: Not on file    Physically abused: Not on file    Forced sexual activity: Not on file  Other Topics Concern  . Not on file  Social History Narrative  . Not on file    ROS Review of Systems  Constitutional: Negative for activity change, appetite change, chills and fever.  HENT: Negative for congestion, nosebleeds, trouble swallowing and voice change.  Respiratory: Negative for cough, shortness of breath and wheezing.   Gastrointestinal: Negative for diarrhea, nausea and vomiting.  Genitourinary: Negative for difficulty urinating, dysuria, flank pain and hematuria.  Musculoskeletal: Negative for back pain, joint swelling and neck pain.  Neurological: see hpi See HPI. All other review of systems negative.   Objective   Vitals as reported by the patient: There were no vitals filed for this visit.  Diagnoses and all orders for this visit:  Benign hypertension- stable, cpm  Dyslipidemia -     Lipid panel; Future -     TSH; Future  Class 2 severe obesity due to excess calories with serious comorbidity and body mass index (BMI) of 39.0 to 39.9 in adult [facility])- discussed exercise and weight loss  Migraine without aura and without status migrainosus, not intractable-  Discussed that she likely has a tension headache that develops into a migraine headache -     rizatriptan (MAXALT-MLT) 10 MG disintegrating tablet; Take 1 tablet (10 mg total) by mouth as needed for migraine. May repeat in 2 hours if needed -     gabapentin (NEURONTIN) 300 MG capsule; Take 1 capsule (300 mg total) by mouth at bedtime. -     VITAMIN D 25 Hydroxy (Vit-D Deficiency, Fractures); Future -     CBC; Future -     TSH; Future   Type 2 diabetes mellitus with hyperlipidemia ([place])- discussed cholesterol goal for diabetics Refilled meds today Pt to come in for labs -     Hemoglobin A1c; Future -     CMP14+EGFR; Future -     Lipid panel; Future -     CBC; Future -     TSH; Future  Other orders -     sitaGLIPtin (JANUVIA) 100 MG tablet; TAKE 1 TABLET (100 MG TOTAL) BY MOUTH DAILY AFTER BREAKFAST. -     olmesartan-hydrochlorothiazide (BENICAR HCT) 40-12.5 MG tablet; Take 1 tablet by mouth daily. -     metFORMIN (GLUCOPHAGE) 500 MG tablet; Take 1 tablet (500 mg total) by mouth daily with breakfast.     I discussed the assessment and treatment plan with the patient. The patient was provided an opportunity to ask questions and all were answered. The patient agreed with the plan and demonstrated an understanding of the instructions.   The patient was advised to call back or seek an in-person evaluation if the symptoms worsen or if the condition fails to improve as anticipated.  I provided 25 minutes of non-face-to-face time during this encounter.  Forrest Moron, MD  Primary Care at [facility]

## 2018-06-25 ENCOUNTER — Ambulatory Visit (INDEPENDENT_AMBULATORY_CARE_PROVIDER_SITE_OTHER): Payer: BLUE CROSS/BLUE SHIELD | Admitting: Family Medicine

## 2018-06-25 ENCOUNTER — Telehealth: Payer: Self-pay | Admitting: Family Medicine

## 2018-06-25 ENCOUNTER — Other Ambulatory Visit: Payer: Self-pay

## 2018-06-25 DIAGNOSIS — I10 Essential (primary) hypertension: Secondary | ICD-10-CM | POA: Diagnosis not present

## 2018-06-25 DIAGNOSIS — E1169 Type 2 diabetes mellitus with other specified complication: Secondary | ICD-10-CM | POA: Diagnosis not present

## 2018-06-25 DIAGNOSIS — E785 Hyperlipidemia, unspecified: Secondary | ICD-10-CM

## 2018-06-25 DIAGNOSIS — G43009 Migraine without aura, not intractable, without status migrainosus: Secondary | ICD-10-CM | POA: Diagnosis not present

## 2018-06-25 DIAGNOSIS — E7800 Pure hypercholesterolemia, unspecified: Secondary | ICD-10-CM

## 2018-06-25 NOTE — Telephone Encounter (Signed)
The patient called because she is without insurance and she is unable to afford Januvia. She would like to know what the provider would like her to do. She stated if she could send in a more affordable option that would be great.

## 2018-06-26 LAB — CMP14+EGFR
ALT: 9 IU/L (ref 0–32)
AST: 13 IU/L (ref 0–40)
Albumin/Globulin Ratio: 1.6 (ref 1.2–2.2)
Albumin: 4.5 g/dL (ref 3.8–4.8)
Alkaline Phosphatase: 61 IU/L (ref 39–117)
BUN/Creatinine Ratio: 14 (ref 9–23)
BUN: 10 mg/dL (ref 6–24)
Bilirubin Total: 0.3 mg/dL (ref 0.0–1.2)
CO2: 22 mmol/L (ref 20–29)
Calcium: 9.3 mg/dL (ref 8.7–10.2)
Chloride: 96 mmol/L (ref 96–106)
Creatinine, Ser: 0.74 mg/dL (ref 0.57–1.00)
GFR calc Af Amer: 109 mL/min/{1.73_m2} (ref >59)
GFR calc non Af Amer: 95 mL/min/{1.73_m2} (ref >59)
Globulin, Total: 2.8 g/dL (ref 1.5–4.5)
Glucose: 141 mg/dL — ABNORMAL HIGH (ref 65–99)
Potassium: 3.9 mmol/L (ref 3.5–5.2)
Sodium: 141 mmol/L (ref 134–144)
Total Protein: 7.3 g/dL (ref 6.0–8.5)

## 2018-06-26 LAB — LIPID PANEL
Chol/HDL Ratio: 2.1 ratio (ref 0.0–4.4)
Cholesterol, Total: 120 mg/dL (ref 100–199)
HDL: 56 mg/dL (ref >39)
LDL Calculated: 49 mg/dL (ref 0–99)
Triglycerides: 76 mg/dL (ref 0–149)
VLDL Cholesterol Cal: 15 mg/dL (ref 5–40)

## 2018-06-26 LAB — CBC
Hematocrit: 39.5 % (ref 34.0–46.6)
Hemoglobin: 12.7 g/dL (ref 11.1–15.9)
MCH: 25.9 pg — ABNORMAL LOW (ref 26.6–33.0)
MCHC: 32.2 g/dL (ref 31.5–35.7)
MCV: 80 fL (ref 79–97)
Platelets: 346 10*3/uL (ref 150–450)
RBC: 4.91 x10E6/uL (ref 3.77–5.28)
RDW: 13.3 % (ref 11.7–15.4)
WBC: 9.7 10*3/uL (ref 3.4–10.8)

## 2018-06-26 LAB — TSH: TSH: 2.11 u[IU]/mL (ref 0.450–4.500)

## 2018-06-26 LAB — HEMOGLOBIN A1C
Est. average glucose Bld gHb Est-mCnc: 166 mg/dL
Hgb A1c MFr Bld: 7.4 % — ABNORMAL HIGH (ref 4.8–5.6)

## 2018-06-26 LAB — VITAMIN D 25 HYDROXY (VIT D DEFICIENCY, FRACTURES): Vit D, 25-Hydroxy: 15.3 ng/mL — ABNORMAL LOW (ref 30.0–100.0)

## 2018-06-29 ENCOUNTER — Encounter: Payer: Self-pay | Admitting: Family Medicine

## 2018-07-03 ENCOUNTER — Other Ambulatory Visit: Payer: Self-pay | Admitting: Family Medicine

## 2018-07-03 MED ORDER — ROSUVASTATIN CALCIUM 10 MG PO TABS: 10.0000 mg | ORAL_TABLET | Freq: Every day | ORAL | 0 refills | Status: DC

## 2018-07-09 NOTE — Telephone Encounter (Signed)
Pt called back to follow up on her request to Dr. Nolon Rod on 06/25/18. She is unable to afford Januvia at the moment due to the $900 cost. She is requesting an alternative that she can take in the mean time. She has not heard anything back yet regarding this. Also she has been doubling up on her metFORMIN and taking it two time a day. She would also like to know if Dr. Terrence Dupont wants her to continue doing this. Please return pt's call. CB#[phone]

## 2018-07-09 NOTE — Telephone Encounter (Signed)
Please advise about medication change.

## 2018-07-16 ENCOUNTER — Other Ambulatory Visit: Payer: Self-pay | Admitting: Family Medicine

## 2018-07-16 MED ORDER — METFORMIN HCL 1000 MG PO TABS: 1000.0000 mg | ORAL_TABLET | Freq: Two times a day (BID) | ORAL | 2 refills | Status: DC

## 2018-07-18 ENCOUNTER — Other Ambulatory Visit: Payer: Self-pay | Admitting: Family Medicine

## 2018-07-18 DIAGNOSIS — G43009 Migraine without aura, not intractable, without status migrainosus: Secondary | ICD-10-CM

## 2018-07-19 NOTE — Telephone Encounter (Signed)
Relation to pt: self  Call back number: [phone]  Pharmacy:  [id number] - [place], [place]  Reason for call:  Patient would like to speak with PCP or PCP nurse stating she requested an alternate medication for sitaGLIPtin (JANUVIA) 100 MG tablet and instead an increase of her metformin was sent to the pharmacy, patient would like discuss today, please advise

## 2018-07-22 ENCOUNTER — Telehealth: Payer: Self-pay | Admitting: *Deleted

## 2018-07-22 NOTE — Telephone Encounter (Signed)
Patient called c/o what appeared to be a breast rash under both breast. Noticed small red bumps, started Goldbond last night. I explained unsure what the area could be without having provider exam this area. Patient is going to use goldbond over the weekend and follow up next week.

## 2018-07-22 NOTE — Telephone Encounter (Signed)
I have called the to gather some more information in regards to her blood sugar readings and pt request.  Pt is currently taking metformin 500 mg BID and has 1000 mg tabs on hand because it was sent to the pharmacy.  Pt has been off of Januvia for the last 2 months due to cost ($924 per fill, and $428 with the Good RX coupon)   Pt wants to know if there is an additional medication that is cost effective and equalivent to Januvia that will help her with her blood sugar.   The 1000 mg BID of metformin seems like too much in the pt option for her mom takes that much and her diabetes is better than her moms.     Pt Home READINGS of Glucose are  ~07/12/18 = 122 Before Dinner ~07/15/18 = 136 Before Breakfast ~07/16/18 = 164 After Lunch  ~07/21/18 = 109 Before Breakfast   Please advise on recommendations.

## 2018-07-26 NOTE — Telephone Encounter (Addendum)
Please notify the patient that based on her sugar readings off the Januvia the past two months she can discontinue the medication and just continue with the metformin at her current dose, exercise and a diabetic diet.

## 2018-07-27 ENCOUNTER — Encounter: Payer: Self-pay | Admitting: *Deleted

## 2018-07-27 ENCOUNTER — Ambulatory Visit: Payer: BLUE CROSS/BLUE SHIELD | Admitting: Women's Health

## 2018-07-27 ENCOUNTER — Encounter: Payer: Self-pay | Admitting: Women's Health

## 2018-07-27 ENCOUNTER — Other Ambulatory Visit: Payer: Self-pay

## 2018-07-27 VITALS — BP 132/80

## 2018-07-27 DIAGNOSIS — B369 Superficial mycosis, unspecified: Secondary | ICD-10-CM

## 2018-07-27 MED ORDER — NYSTATIN-TRIAMCINOLONE 100000-0.1 UNIT/GM-% EX OINT: 1.0000 "application " | TOPICAL_OINTMENT | Freq: Two times a day (BID) | CUTANEOUS | 0 refills | Status: DC

## 2018-07-27 NOTE — Addendum Note (Signed)
Addended by: Delia Chimes A on: 07/27/2018 12:01 AM   Modules accepted: Orders

## 2018-07-27 NOTE — Progress Notes (Signed)
53 year old MBF G2, P1 presents with complaint of rash under both breasts for the past week with no improvement using over-the-counter home remedies.  Has used Gold Bond medicated powder, tee tree  oil and coconut oil.  Minimal relief with continued irritation, itching and burning sensation.  Denies  change in routine but noticed after being overheated with increased perspiration.  05/2014 [name] on 1 mg estradiol with good control of menopausal symptoms, having no bleeding.  Denies vaginal discharge, irritation, urinary symptoms, abdominal/back pain or fever.  Medical problems include type 2 diabetes, hypertension, hypercholesteremia, and arthritis primary care manages.  Last hemoglobin A1c 7.4, stable.  Exam: Appears uncomfortable, under both breasts visible white discoloration with superficial blistering of skin.  No odor or visible drainage noted, mild erythema.  Skin culture taken.  Probable skin yeast infection  Plan: Skin culture pending.  Mycolog ointment small amount twice daily under both breasts, keep area clean and dry, avoid over-the-counter medication.  Reviewed importance of low-carb diet, weight loss and diabetes control.  Instructed to call if no relief in 2 to 3 days.

## 2018-07-27 NOTE — Telephone Encounter (Signed)
Message sent my chart  

## 2018-07-27 NOTE — Patient Instructions (Signed)

## 2018-08-20 LAB — CULT, FUNGUS, SKIN,HAIR,NAIL W/KOH
MICRO NUMBER:: 505424
SMEAR:: NONE SEEN
SPECIMEN QUALITY:: ADEQUATE

## 2018-08-26 ENCOUNTER — Encounter: Payer: Self-pay | Admitting: Women's Health
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
## 2018-09-16 ENCOUNTER — Other Ambulatory Visit: Payer: Self-pay | Admitting: Family Medicine

## 2018-09-16 NOTE — Telephone Encounter (Signed)
Forwarding medication refill to PCP for review. 

## 2018-10-12 ENCOUNTER — Other Ambulatory Visit: Payer: Self-pay | Admitting: Family Medicine

## 2018-10-12 NOTE — Telephone Encounter (Signed)
Requested Prescriptions  Pending Prescriptions Disp Refills  . rosuvastatin (CRESTOR) 10 MG tablet [Pharmacy Med Name: ROSUVASTATIN CALCIUM 10 MG TAB] 90 tablet 0    Sig: TAKE 1 TABLET (10 MG TOTAL) BY MOUTH DAILY AFTER BREAKFAST.     Cardiovascular:  Antilipid - Statins Passed - 10/12/2018  9:05 AM      Passed - Total Cholesterol in normal range and within 360 days    Cholesterol, Total  Date Value Ref Range Status  06/25/2018 120 100 - 199 mg/dL Final         Passed - LDL in normal range and within 360 days    LDL Calculated  Date Value Ref Range Status  06/25/2018 49 0 - 99 mg/dL Final         Passed - HDL in normal range and within 360 days    HDL  Date Value Ref Range Status  06/25/2018 56 >39 mg/dL Final         Passed - Triglycerides in normal range and within 360 days    Triglycerides  Date Value Ref Range Status  06/25/2018 76 0 - 149 mg/dL Final         Passed - Patient is not pregnant      Passed - Valid encounter within last 12 months    Recent Outpatient Visits          3 months ago Dyslipidemia   Primary Care at [facility], Zoe A, MD   11 months ago Type 2 diabetes mellitus with complication, unspecified whether long term insulin use ([place])   Primary Care at [facility], Arlie Solomons, MD

## 2018-11-16 ENCOUNTER — Other Ambulatory Visit: Payer: Self-pay | Admitting: Family Medicine

## 2018-11-16 ENCOUNTER — Other Ambulatory Visit: Payer: Self-pay

## 2018-11-16 ENCOUNTER — Telehealth: Payer: Self-pay | Admitting: Family Medicine

## 2018-11-16 DIAGNOSIS — E785 Hyperlipidemia, unspecified: Secondary | ICD-10-CM

## 2018-11-16 DIAGNOSIS — E1169 Type 2 diabetes mellitus with other specified complication: Secondary | ICD-10-CM

## 2018-11-16 MED ORDER — METFORMIN HCL 500 MG PO TABS: ORAL_TABLET | ORAL | 0 refills | Status: DC

## 2018-11-16 NOTE — Telephone Encounter (Signed)
Requested medication (s) are due for refill today: yes  Requested medication (s) are on the active medication list: yes  Last refill: 11/16/2018   Future visit scheduled: yes  Notes to clinic:  Requesting 90 day  Requested Prescriptions  Pending Prescriptions Disp Refills   metFORMIN (GLUCOPHAGE) 500 MG tablet [Pharmacy Med Name: METFORMIN HCL 500 MG TABLET] 90 tablet 0    Sig: TAKE 1 TABLET BY MOUTH EVERY DAY WITH BREAKFAST     Endocrinology:  Diabetes - Biguanides Passed - 11/16/2018  2:28 PM      Passed - Cr in normal range and within 360 days    Creatinine, Ser  Date Value Ref Range Status  06/25/2018 0.74 0.57 - 1.00 mg/dL Final         Passed - HBA1C is between 0 and 7.9 and within 180 days    Hgb A1c MFr Bld  Date Value Ref Range Status  06/25/2018 7.4 (H) 4.8 - 5.6 % Final    Comment:             Prediabetes: 5.7 - 6.4          Diabetes: >6.4          Glycemic control for adults with diabetes: <7.0          Passed - eGFR in normal range and within 360 days    GFR calc Af Amer  Date Value Ref Range Status  06/25/2018 109 >59 mL/min/1.73 Final   GFR calc non Af Amer  Date Value Ref Range Status  06/25/2018 95 >59 mL/min/1.73 Final         Passed - Valid encounter within last 6 months    Recent Outpatient Visits          4 months ago Dyslipidemia   Primary Care at [place] [facility], Zoe A, MD   5 months ago Benign hypertension   Primary Care at [place] [facility], Zoe A, MD   1 year ago Type 2 diabetes mellitus with complication, unspecified whether long term insulin use (HCC)   Primary Care at [place] [facility], Zoe A, MD      Future Appointments            Tomorrow Clark, Gilbert W, PA-C [facility]   In 1 month Stall[facility], Zoe A, MD Primary Care at [place], [facility]               

## 2018-11-16 NOTE — Telephone Encounter (Signed)
Patient states she is low on her metformin would need it called it

## 2018-11-16 NOTE — Telephone Encounter (Signed)
Rx sent to pharmacy   

## 2018-11-17 ENCOUNTER — Ambulatory Visit (INDEPENDENT_AMBULATORY_CARE_PROVIDER_SITE_OTHER): Payer: No Typology Code available for payment source | Admitting: Physician Assistant

## 2018-11-17 ENCOUNTER — Encounter: Payer: Self-pay | Admitting: Physician Assistant

## 2018-11-17 ENCOUNTER — Ambulatory Visit (INDEPENDENT_AMBULATORY_CARE_PROVIDER_SITE_OTHER): Payer: No Typology Code available for payment source

## 2018-11-17 ENCOUNTER — Ambulatory Visit: Payer: Self-pay

## 2018-11-17 VITALS — Ht 58.5 in | Wt 195.0 lb

## 2018-11-17 DIAGNOSIS — M79641 Pain in right hand: Secondary | ICD-10-CM | POA: Diagnosis not present

## 2018-11-17 DIAGNOSIS — M79642 Pain in left hand: Secondary | ICD-10-CM

## 2018-11-17 NOTE — Progress Notes (Signed)
Office Visit Note   Patient: [name] Vasquez           Date of Birth: [birth year]           MRN: [id number] Visit Date: 11/17/2018              Requested by: [name] Moron, MD [place],  [place] [address] PCP: [name] Moron, MD   Assessment & Plan: Visit Diagnoses:  1. Bilateral hand pain     Plan:  We will have her try Voltaren gel on the hands applying it to the areas of maximal discomfort which is at the fingertips involving the index long and ring finger.  Also obtain EMG nerve conduction studies to rule out carpal tunnel as a source of her hand pain.  Have her follow-up after the EMG nerve conduction studies to go over results and discuss further treatment. Follow-Up Instructions: Return After EMG nerve conduction studies.   Orders:  Orders Placed This Encounter  Procedures  . XR Hand Complete Left  . XR Hand Complete Right  . Ambulatory referral to Physical Medicine Rehab   No orders of the defined types were placed in this encounter.     Procedures: No procedures performed   Clinical Data: No additional findings.   Subjective: Chief Complaint  Patient presents with  . Left Wrist - Pain  . Right Wrist - Pain    HPI [name] Vasquez is well-known.by my service comes in today with bilateral wrist and hand pain for the past 2 months.  States pain begins in the palm of her hand it goes into the fingertips.  She describes no numbness tingling.  She states she has difficulty holding items at times.  States that the pain will come and go it does awaken her.  She is right-hand dominant.  She is diabetic is been diabetic for 8 to 10 years.  She is been taking ibuprofen which seems to help some.  Review of Systems See HPI   Objective: Vital Signs: Ht 4' 10.5" (1.486 m)   Wt 195 lb (88.5 kg)   BMI 40.06 kg/m   Physical Exam Constitutional:      Appearance: She is not ill-appearing or diaphoretic.  Cardiovascular:     Pulses: Normal pulses.   Neurological:     Mental Status: She is alert and oriented to person, place, and time.  Psychiatric:        Mood and Affect: Mood normal.     Ortho Exam Bilateral hands full range of motion without pain.  Full motor bilateral hands.  No triggering of fingers.  Sensation grossly intact throughout both hands.  Negative Tinel's at the wrist over the median nerve bilaterally.  Compression test over the median nerve is negative bilaterally Phalen's test negative.  Good range of motion bilateral wrist without pain.  There is no rashes skin lesions ulcerations erythema.  Patient however occasionally does stops and shakes her hand particularly the right. Specialty Comments:  No specialty comments available.  Imaging: Xr Hand Complete Left  Result Date: 11/17/2018 Left hand 3 views: No acute fractures or acute findings.  Carpal metacarpal and wrist joints are well-maintained.  No bony abnormalities.  Xr Hand Complete Right  Result Date: 11/17/2018 Right hand: 3 views show no acute fractures no bony abnormalities.  Joint is well-maintained throughout the hand.    PMFS History: Patient Active Problem List   Diagnosis Date Noted  . Acute right-sided low back pain with right-sided  sciatica 06/10/2017  . Trochanteric bursitis, right hip 05/27/2017  . Pain in right hip 05/27/2017  . Contusion, thigh and hip, left, sequela 01/08/2017  . Low back pain 01/08/2017  . IUD (intrauterine device) in place 05/16/2014  . Atypical chest pain 11/18/2012  . Injection site extravasation of IV contrast 11/18/2012  . DM (diabetes mellitus), type 2 ([place]) 11/18/2012  . Benign hypertension 11/18/2012  . Hypokalemia 11/18/2012  . Menopause 09/16/2011  . Elevated cholesterol    Past Medical History:  Diagnosis Date  . Arthritis   . Diabetes mellitus   . Elevated cholesterol   . Endometrial polyp   . Endometriosis   . Hypertension   . Ovarian cyst     Family History  Problem Relation Age of Onset  .  Hypertension Mother   . Diabetes Mother   . Hypertension Father   . Diabetes Sister   . Hypertension Brother   . Diabetes Maternal Grandfather   . Colon cancer Neg Hx   . Esophageal cancer Neg Hx   . Rectal cancer Neg Hx   . Stomach cancer Neg Hx     Past Surgical History:  Procedure Laterality Date  . COMBINED HYSTEROSCOPY DIAGNOSTIC / D&C    . DIAGNOSTIC LAPAROSCOPY  1993   with laser adhesions  . INTRAUTERINE DEVICE INSERTION     mirena-Inserted 05-16-14  . KNEE SURGERY    . OOPHORECTOMY  2008   left  . ROTATOR CUFF REPAIR    . trichomoniasis  01/2013  . WRIST SURGERY     gang. cyst   Social History   Occupational History  . Not on file  Tobacco Use  . Smoking status: Never Smoker  . Smokeless tobacco: Never Used  Substance and Sexual Activity  . Alcohol use: Yes    Alcohol/week: 0.0 standard drinks    Comment: rare  . Drug use: No  . Sexual activity: Not Currently    Birth control/protection: I.U.D.    Comment: Mirena inserted 05-16-14.1st intercourse- 19, partners- 5

## 2018-11-19 ENCOUNTER — Other Ambulatory Visit: Payer: Self-pay

## 2018-11-19 MED ORDER — ESTRADIOL 1 MG PO TABS: 1.0000 mg | ORAL_TABLET | Freq: Every day | ORAL | 0 refills | Status: DC

## 2018-11-24 ENCOUNTER — Encounter: Payer: Self-pay | Admitting: Gynecology

## 2018-12-08 ENCOUNTER — Ambulatory Visit (INDEPENDENT_AMBULATORY_CARE_PROVIDER_SITE_OTHER): Payer: No Typology Code available for payment source | Admitting: Physical Medicine and Rehabilitation

## 2018-12-08 DIAGNOSIS — M79641 Pain in right hand: Secondary | ICD-10-CM

## 2018-12-08 DIAGNOSIS — M79642 Pain in left hand: Secondary | ICD-10-CM

## 2018-12-08 DIAGNOSIS — R202 Paresthesia of skin: Secondary | ICD-10-CM | POA: Diagnosis not present

## 2018-12-08 NOTE — Procedures (Signed)
EMG & NCV Findings: Evaluation of the left median motor nerve showed decreased conduction velocity (Elbow-Wrist, 47 m/s).  The right median motor and the left median (across palm) sensory nerves showed reduced amplitude (R2.1, L1.9 V).  The right median (across palm) sensory nerve showed prolonged distal peak latency (Wrist, 3.7 ms), reduced amplitude (8.8 V), and prolonged distal peak latency (Palm, 2.4 ms).  All remaining nerves (as indicated in the following tables) were within normal limits.  Left vs. Right side comparison data for the median motor nerve indicates abnormal L-R amplitude difference (81.1 %).    All examined muscles (as indicated in the following table) showed no evidence of electrical instability.    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  Clinically her symptoms do not fit very well with carpal tunnel syndrome without any numbness tingling or paresthesia.  The test itself had some technical artifact or anatomic artifact with stimulating at the wrist bilaterally.  We seem to get more amplitude stimulating at the elbow median nerve than at the wrist.  I did learn on the left side to increase the delay on the stimulation and this caused actually perfect stimulation of the median nerve in the wrist.  She may have some level of increased thickness of the tissue in this region.   There is no significant electrodiagnostic evidence of any other focal nerve entrapment, brachial plexopathy, cervical radiculopathy or generalized peripheral neuropathy.   Recommendations: 1.  Follow-up with referring physician. 2.  Continue current management of symptoms.  ___________________________ Laurence Spates FAAPMR Board Certified, American Board of Physical Medicine and Rehabilitation    Nerve Conduction Studies Anti Sensory Summary Table   Stim Site NR Peak (ms) Norm Peak  (ms) P-T Amp (V) Norm P-T Amp Site1 Site2 Delta-P (ms) Dist (cm) Vel (m/s) Norm Vel (m/s)  Left Median Acr Palm Anti Sensory (2nd Digit)  32.5C  Wrist    3.2 <3.6 *1.9 >10 Wrist Palm 1.4 0.0    Palm    1.8 <2.0 16.8         Right Median Acr Palm Anti Sensory (2nd Digit)  32.3C  Wrist    *3.7 <3.6 *8.8 >10 Wrist Palm 1.3 0.0    Palm    *2.4 <2.0 6.6         Right Radial Anti Sensory (Base 1st Digit)  33C  Wrist    2.2 <3.1 23.4  Wrist Base 1st Digit 2.2 0.0    Right Ulnar Anti Sensory (5th Digit)  32.5C  Wrist    3.1 <3.7 35.5 >15.0 Wrist 5th Digit 3.1 14.0 45 >38   Motor Summary Table   Stim Site NR Onset (ms) Norm Onset (ms) O-P Amp (mV) Norm O-P Amp Site1 Site2 Delta-0 (ms) Dist (cm) Vel (m/s) Norm Vel (m/s)  Left Median Motor (Abd Poll Brev)  33.6C  Wrist    3.8 <4.2 11.1 >5 Elbow Wrist 3.4 16.0 *47 >50  Elbow    7.2  11.4         Right Median Motor (Abd Poll Brev)  32.2C  Wrist    3.8 <4.2 *2.1 >5 Elbow Wrist 3.5 17.5 50 >50  Elbow    7.3  9.1         Right Ulnar Motor (Abd Dig Min)  30.9C  Wrist    2.8 <4.2 12.5 >3 B Elbow Wrist 2.8 17.5 63 >53  B Elbow  5.6  12.4  A Elbow B Elbow 1.2 10.0 83 >53  A Elbow    6.8  12.1          EMG   Side Muscle Nerve Root Ins Act Fibs Psw Amp Dur Poly Recrt Int Fraser Din Comment  Right Abd Poll Brev Median C8-T1 Nml Nml Nml Nml Nml 0 Nml Nml   Right 1stDorInt Ulnar C8-T1 Nml Nml Nml Nml Nml 0 Nml Nml   Right PronatorTeres Median C6-7 Nml Nml Nml Nml Nml 0 Nml Nml   Right Biceps Musculocut C5-6 Nml Nml Nml Nml Nml 0 Nml Nml   Right Deltoid Axillary C5-6 Nml Nml Nml Nml Nml 0 Nml Nml     Nerve Conduction Studies Anti Sensory Left/Right Comparison   Stim Site L Lat (ms) R Lat (ms) L-R Lat (ms) L Amp (V) R Amp (V) L-R Amp (%) Site1 Site2 L Vel (m/s) R Vel (m/s) L-R Vel (m/s)  Median Acr Palm Anti Sensory (2nd Digit)  32.5C  Wrist 3.2 *3.7 0.5 *1.9 *8.8 78.4 Wrist Palm     Palm 1.8 *2.4 0.6 16.8 6.6 60.7       Radial Anti Sensory  (Base 1st Digit)  33C  Wrist  2.2   23.4  Wrist Base 1st Digit     Ulnar Anti Sensory (5th Digit)  32.5C  Wrist  3.1   35.5  Wrist 5th Digit  45    Motor Left/Right Comparison   Stim Site L Lat (ms) R Lat (ms) L-R Lat (ms) L Amp (mV) R Amp (mV) L-R Amp (%) Site1 Site2 L Vel (m/s) R Vel (m/s) L-R Vel (m/s)  Median Motor (Abd Poll Brev)  33.6C  Wrist 3.8 3.8 0.0 11.1 *2.1 *81.1 Elbow Wrist *47 50 3  Elbow 7.2 7.3 0.1 11.4 9.1 20.2       Ulnar Motor (Abd Dig Min)  30.9C  Wrist  2.8   12.5  B Elbow Wrist  63   B Elbow  5.6   12.4  A Elbow B Elbow  83   A Elbow  6.8   12.1           Waveforms:

## 2018-12-08 NOTE — Progress Notes (Signed)
 .  Numeric Pain Rating Scale and Functional Assessment Average Pain 3   In the last MONTH (on 0-10 scale) has pain interfered with the following?  1. General activity like being  able to carry out your everyday physical activities such as walking, climbing stairs, carrying groceries, or moving a chair?  Rating(4)

## 2018-12-08 NOTE — Progress Notes (Signed)
[name] - 53 y.o. female MRN [id number]  Date of birth: 1965-06-07  Office Visit Note: Visit Date: 12/08/2018 PCP: Forrest Moron, MD Referred by: Forrest Moron, MD  Subjective: Chief Complaint  Patient presents with  . Left Wrist - Pain  . Right Wrist - Pain   HPI:  [name] is a 53 y.o. female who comes in today Electrodiagnostic study of both upper limbs at the request of Benita Stabile, PA-C.  Patient is right-hand dominant with a history of prior ganglion cyst removal on the volar side of the wrist by Dr. Amedeo Plenty in 2014.  She reports having had electrodiagnostic study at that point but does not remember the results.  We do not have that for review.  She reports pain in both wrist without numbness or tingling.  She reports several months of worsening pain.  Rates her hand pain is a 3 out of 10.  She reports worsening with typing and using the mouse pad.  She gets some relief with rest and not using her hands.  She has not had prior carpal tunnel surgery.  She is a type II diabetic.  She has no numbness or tingling in the hand or fingertips.  No neck pain or radicular pain.  ROS Otherwise per HPI.  Assessment & Plan: Visit Diagnoses:  1. Bilateral hand pain   2. 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  Clinically her symptoms do not fit very well with carpal tunnel syndrome without any numbness tingling or paresthesia.  The test itself had some technical artifact or anatomic artifact with stimulating at the wrist bilaterally.  We seem to get more amplitude stimulating at the elbow median nerve than at the wrist.  I did learn on the left side to increase the delay on the stimulation and this caused actually perfect stimulation of the median nerve in the wrist.  She may have some level of increased thickness of the  tissue in this region.   There is no significant electrodiagnostic evidence of any other focal nerve entrapment, brachial plexopathy, cervical radiculopathy or generalized peripheral neuropathy.   Recommendations: 1.  Follow-up with referring physician. 2.  Continue current management of symptoms.  Meds & Orders: No orders of the defined types were placed in this encounter.   Orders Placed This Encounter  Procedures  . NCV with EMG (electromyography)    Follow-up: Return for Benita Stabile, P.A.-C as scheduled.   Procedures: No procedures performed  EMG & NCV Findings: Evaluation of the left median motor nerve showed decreased conduction velocity (Elbow-Wrist, 47 m/s).  The right median motor and the left median (across palm) sensory nerves showed reduced amplitude (R2.1, L1.9 V).  The right median (across palm) sensory nerve showed prolonged distal peak latency (Wrist, 3.7 ms), reduced amplitude (8.8 V), and prolonged distal peak latency (Palm, 2.4 ms).  All remaining nerves (as indicated in the following tables) were within normal limits.  Left vs. Right side comparison data for the median motor nerve indicates abnormal L-R amplitude difference (81.1 %).    All examined muscles (as indicated in the following table) showed no evidence of electrical instability.    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  Clinically her symptoms do not  fit very well with carpal tunnel syndrome without any numbness tingling or paresthesia.  The test itself had some technical artifact or anatomic artifact with stimulating at the wrist bilaterally.  We seem to get more amplitude stimulating at the elbow median nerve than at the wrist.  I did learn on the left side to increase the delay on the stimulation and this caused actually perfect stimulation of the median nerve in the wrist.  She may  have some level of increased thickness of the tissue in this region.   There is no significant electrodiagnostic evidence of any other focal nerve entrapment, brachial plexopathy, cervical radiculopathy or generalized peripheral neuropathy.   Recommendations: 1.  Follow-up with referring physician. 2.  Continue current management of symptoms.  ___________________________ Laurence Spates FAAPMR Board Certified, American Board of Physical Medicine and Rehabilitation    Nerve Conduction Studies Anti Sensory Summary Table   Stim Site NR Peak (ms) Norm Peak (ms) P-T Amp (V) Norm P-T Amp Site1 Site2 Delta-P (ms) Dist (cm) Vel (m/s) Norm Vel (m/s)  Left Median Acr Palm Anti Sensory (2nd Digit)  32.5C  Wrist    3.2 <3.6 *1.9 >10 Wrist Palm 1.4 0.0    Palm    1.8 <2.0 16.8         Right Median Acr Palm Anti Sensory (2nd Digit)  32.3C  Wrist    *3.7 <3.6 *8.8 >10 Wrist Palm 1.3 0.0    Palm    *2.4 <2.0 6.6         Right Radial Anti Sensory (Base 1st Digit)  33C  Wrist    2.2 <3.1 23.4  Wrist Base 1st Digit 2.2 0.0    Right Ulnar Anti Sensory (5th Digit)  32.5C  Wrist    3.1 <3.7 35.5 >15.0 Wrist 5th Digit 3.1 14.0 45 >38   Motor Summary Table   Stim Site NR Onset (ms) Norm Onset (ms) O-P Amp (mV) Norm O-P Amp Site1 Site2 Delta-0 (ms) Dist (cm) Vel (m/s) Norm Vel (m/s)  Left Median Motor (Abd Poll Brev)  33.6C  Wrist    3.8 <4.2 11.1 >5 Elbow Wrist 3.4 16.0 *47 >50  Elbow    7.2  11.4         Right Median Motor (Abd Poll Brev)  32.2C  Wrist    3.8 <4.2 *2.1 >5 Elbow Wrist 3.5 17.5 50 >50  Elbow    7.3  9.1         Right Ulnar Motor (Abd Dig Min)  30.9C  Wrist    2.8 <4.2 12.5 >3 B Elbow Wrist 2.8 17.5 63 >53  B Elbow    5.6  12.4  A Elbow B Elbow 1.2 10.0 83 >53  A Elbow    6.8  12.1          EMG   Side Muscle Nerve Root Ins Act Fibs Psw Amp Dur Poly Recrt Int Fraser Din Comment  Right Abd Poll Brev Median C8-T1 Nml Nml Nml Nml Nml 0 Nml Nml   Right 1stDorInt Ulnar C8-T1 Nml Nml Nml  Nml Nml 0 Nml Nml   Right PronatorTeres Median C6-7 Nml Nml Nml Nml Nml 0 Nml Nml   Right Biceps Musculocut C5-6 Nml Nml Nml Nml Nml 0 Nml Nml   Right Deltoid Axillary C5-6 Nml Nml Nml Nml Nml 0 Nml Nml     Nerve Conduction Studies Anti Sensory Left/Right Comparison   Stim Site L Lat (ms) R Lat (ms) L-R Lat (ms) L  Amp (V) R Amp (V) L-R Amp (%) Site1 Site2 L Vel (m/s) R Vel (m/s) L-R Vel (m/s)  Median Acr Palm Anti Sensory (2nd Digit)  32.5C  Wrist 3.2 *3.7 0.5 *1.9 *8.8 78.4 Wrist Palm     Palm 1.8 *2.4 0.6 16.8 6.6 60.7       Radial Anti Sensory (Base 1st Digit)  33C  Wrist  2.2   23.4  Wrist Base 1st Digit     Ulnar Anti Sensory (5th Digit)  32.5C  Wrist  3.1   35.5  Wrist 5th Digit  45    Motor Left/Right Comparison   Stim Site L Lat (ms) R Lat (ms) L-R Lat (ms) L Amp (mV) R Amp (mV) L-R Amp (%) Site1 Site2 L Vel (m/s) R Vel (m/s) L-R Vel (m/s)  Median Motor (Abd Poll Brev)  33.6C  Wrist 3.8 3.8 0.0 11.1 *2.1 *81.1 Elbow Wrist *47 50 3  Elbow 7.2 7.3 0.1 11.4 9.1 20.2       Ulnar Motor (Abd Dig Min)  30.9C  Wrist  2.8   12.5  B Elbow Wrist  63   B Elbow  5.6   12.4  A Elbow B Elbow  83   A Elbow  6.8   12.1           Waveforms:                Clinical History: No specialty comments available.     Objective:  VS:  HT:    WT:   BMI:     BP:   HR: bpm  TEMP: ( )  RESP:  Physical Exam Musculoskeletal:        General: No swelling, tenderness or deformity.     Comments: Inspection reveals no atrophy of the bilateral APB or FDI or hand intrinsics. There is no swelling, color changes, allodynia or dystrophic changes. There is 5 out of 5 strength in the bilateral wrist extension, finger abduction and long finger flexion. There is intact sensation to light touch in all dermatomal and peripheral nerve distributions. There is a negative Hoffmann's test bilaterally.  Skin:    General: Skin is warm and dry.     Findings: No erythema or rash.  Neurological:      General: No focal deficit present.     Mental Status: She is alert and oriented to person, place, and time.     Motor: No weakness or abnormal muscle tone.     Coordination: Coordination normal.  Psychiatric:        Mood and Affect: Mood normal.        Behavior: Behavior normal.     Ortho Exam Imaging: No results found.

## 2018-12-09 ENCOUNTER — Other Ambulatory Visit: Payer: Self-pay

## 2018-12-10 ENCOUNTER — Encounter: Payer: Self-pay | Admitting: Gynecology

## 2018-12-10 ENCOUNTER — Ambulatory Visit (INDEPENDENT_AMBULATORY_CARE_PROVIDER_SITE_OTHER): Payer: No Typology Code available for payment source | Admitting: Gynecology

## 2018-12-10 VITALS — BP 124/84 | Ht <= 58 in | Wt 191.0 lb

## 2018-12-10 DIAGNOSIS — Z30431 Encounter for routine checking of intrauterine contraceptive device: Secondary | ICD-10-CM | POA: Diagnosis not present

## 2018-12-10 DIAGNOSIS — Z01419 Encounter for gynecological examination (general) (routine) without abnormal findings: Secondary | ICD-10-CM

## 2018-12-10 DIAGNOSIS — Z1151 Encounter for screening for human papillomavirus (HPV): Secondary | ICD-10-CM | POA: Diagnosis not present

## 2018-12-10 DIAGNOSIS — Z79890 Hormone replacement therapy (postmenopausal): Secondary | ICD-10-CM

## 2018-12-10 MED ORDER — ESTRADIOL 1 MG PO TABS: 1.0000 mg | ORAL_TABLET | Freq: Every day | ORAL | 4 refills | Status: DC

## 2018-12-10 NOTE — Addendum Note (Signed)
Addended by: Nelva Nay on: 12/10/2018 03:28 PM   Modules accepted: Orders

## 2018-12-10 NOTE — Progress Notes (Signed)
    [name] [birth year] [id number]        53 y.o.  G2P1011 for annual gynecologic exam.  Doing well without gynecologic complaints.  Past medical history,surgical history, problem list, medications, allergies, family history and social history were all reviewed and documented as reviewed in the EPIC chart.  ROS:  Performed with pertinent positives and negatives included in the history, assessment and plan.   Additional significant findings : None   Exam: [name] assistant Vitals:   12/10/18 1425  BP: 124/84  Weight: 191 lb (86.6 kg)  Height: 4\' 10"  (1.473 m)   Body mass index is 39.92 kg/m.  General appearance:  Normal affect, orientation and appearance. Skin: Grossly normal HEENT: Without gross lesions.  No cervical or supraclavicular adenopathy. Thyroid normal.  Lungs:  Clear without wheezing, rales or rhonchi Cardiac: RR, without RMG Abdominal:  Soft, nontender, without masses, guarding, rebound, organomegaly or hernia Breasts:  Examined lying and sitting without masses, retractions, discharge or axillary adenopathy. Pelvic:  Ext, BUS, Vagina: Normal  Cervix: Normal.  IUD string visualized.  Pap smear/HPV  Uterus: Grossly normal size, shape and contour, midline and mobile nontender   Adnexa: Without masses or tenderness    Anus and perineum: Normal   Rectovaginal: Normal sphincter tone without palpated masses or tenderness.    Assessment/Plan:  53 y.o. G48P1011 female for annual gynecologic exam.  [name]  1. Postmenopausal/HRT.  Continues on estradiol 1 mg daily.  Using [name] as endometrial protection.  We again discussed the risks versus benefits of HRT to include increased risk of thrombosis of the breast cancer issue.  Using the IUD as off brand labeling for endometrial protection.  At this point the patient wants to continue and I refilled her estradiol x1 year.  Her IUD is expiring beginning of next year.  We discussed options to include replacing  the IUD at that time or removing it and starting on progesterone such as Prometrium 100 mg nightly.  Will readdress that issue beginning of next year.  She knows the need to follow-up by 05/2019 for IUD management. 2. Mammography 2019.  Mammography scheduled end of this month.  Breast exam normal today. 3. Pap smear 2018.  Pap smear/HPV today.  No history of significant abnormal Pap smears. 4. Colonoscopy 2019.  Repeat at their recommended interval. 5. DEXA never.  Will plan further into the menopause. 6. Health maintenance.  No routine lab work done as patient does this elsewhere.  Follow-up 1 year, sooner as needed.   Anastasio Auerbach MD, 2:46 PM 12/10/2018

## 2018-12-10 NOTE — Patient Instructions (Addendum)
Follow-up the beginning of 2021 to have the IUD removed and to discuss options at that time.  Follow-up in 1 year for annual exam, sooner if any issues.

## 2018-12-13 ENCOUNTER — Ambulatory Visit (INDEPENDENT_AMBULATORY_CARE_PROVIDER_SITE_OTHER): Payer: No Typology Code available for payment source | Admitting: Physician Assistant

## 2018-12-13 ENCOUNTER — Encounter: Payer: Self-pay | Admitting: Physician Assistant

## 2018-12-13 VITALS — Ht <= 58 in | Wt 191.0 lb

## 2018-12-13 DIAGNOSIS — G5601 Carpal tunnel syndrome, right upper limb: Secondary | ICD-10-CM | POA: Diagnosis not present

## 2018-12-13 DIAGNOSIS — M79641 Pain in right hand: Secondary | ICD-10-CM | POA: Diagnosis not present

## 2018-12-13 DIAGNOSIS — M79642 Pain in left hand: Secondary | ICD-10-CM

## 2018-12-13 MED ORDER — DICLOFENAC SODIUM 75 MG PO TBEC: 75.0000 mg | DELAYED_RELEASE_TABLET | Freq: Two times a day (BID) | ORAL | 1 refills | Status: DC

## 2018-12-13 NOTE — Progress Notes (Signed)
Office Visit Note   Patient: [name]           Date of Birth: [birth year]           MRN: [id number] Visit Date: 12/13/2018              Requested by: Forrest Moron, MD [place],  [place] [id number] PCP: Forrest Moron, MD   Assessment & Plan: Visit Diagnoses:  1. Bilateral hand pain   2. Carpal tunnel syndrome, right upper limb     Plan: We will have her start diclofenac 75 mg twice daily and continue to use the Voltaren gel on the right breast.  Should follow up with [place] if her pain persist or becomes worse.  Questions encouraged and answered.  In regards to her back if the soreness in her low back becomes worse or she develops any radicular symptoms she will follow up with [place].  Questions encouraged and answered.  Follow-Up Instructions: No follow-ups on file.   Orders:  No orders of the defined types were placed in this encounter.  Meds ordered this encounter  Medications  . diclofenac (VOLTAREN) 75 MG EC tablet    Sig: Take 1 tablet (75 mg total) by mouth 2 (two) times daily.    Dispense:  60 tablet    Refill:  1      Procedures: No procedures performed   Clinical Data: No additional findings.   Subjective: Chief Complaint  Patient presents with  . Right Hand - Follow-up  . Left Hand - Follow-up    HPI [name] returns today follow-up of her bilateral hand pain.  She states that hand pain is improved a lot since her last visit she has been using Voltaren gel.  She did have a EMG nerve conduction studies which showed mild carpal tunnel on the right.  Negative on the left.  She reports she only has pain at the right wrist at this point in time.  She has been doing a lot of lifting on her mother has COPD and lung cancer.  She reports a fall with her mom which landed on her back.  No injury to either wrist.  She is having no radicular symptoms down either leg.  She has been taking ibuprofen for back is unsure if this is helping.  Review of  Systems Negative for fevers chills shortness of breath chest pain.  Please see HPI otherwise negative  Objective: Vital Signs: Ht 4\' 10"  (1.473 m)   Wt 191 lb (86.6 kg)   BMI 39.92 kg/m   Physical Exam Constitutional:      Appearance: She is not ill-appearing or diaphoretic.  Cardiovascular:     Pulses: Normal pulses.  Pulmonary:     Effort: Pulmonary effort is normal.  Neurological:     Mental Status: She is alert and oriented to person, place, and time.     Ortho Exam No hands full sensation.  Radial pulses are 2+ and equal and symmetric.  No rashes skin lesions or ulcerations either hand or wrist.  No edema in either wrist.  She has a negative Tinel's over the wrist median nerve on the right.  Negative compression test on the right.  Slight tenderness over the dorsal aspect of the right hand only distal ulna region and also over the volar region of the right hand near her previous ganglion excision which is located over the distal radius.  No tenderness at the right elbow.  Full supination pronation of the right hand.  Full extension and flexion of the right wrist without pain. Specialty Comments:  No specialty comments available.  Imaging: No results found.   PMFS History: Patient Active Problem List   Diagnosis Date Noted  . Acute right-sided low back pain with right-sided sciatica 06/10/2017  . Trochanteric bursitis, right hip 05/27/2017  . Pain in right hip 05/27/2017  . Contusion, thigh and hip, left, sequela 01/08/2017  . Low back pain 01/08/2017  . IUD (intrauterine device) in place 05/16/2014  . Atypical chest pain 11/18/2012  . Injection site extravasation of IV contrast 11/18/2012  . DM (diabetes mellitus), type 2 ([place]) 11/18/2012  . Benign hypertension 11/18/2012  . Hypokalemia 11/18/2012  . Menopause 09/16/2011  . Elevated cholesterol    Past Medical History:  Diagnosis Date  . Arthritis   . Diabetes mellitus   . Elevated cholesterol   . Endometrial  polyp   . Endometriosis   . Hypertension   . Migraines   . Ovarian cyst     Family History  Problem Relation Age of Onset  . Hypertension Mother   . Diabetes Mother   . COPD Mother   . Cancer Mother        Lung  . Hypertension Father   . Diabetes Sister   . Hypertension Brother   . Diabetes Maternal Grandfather   . Colon cancer Neg Hx   . Esophageal cancer Neg Hx   . Rectal cancer Neg Hx   . Stomach cancer Neg Hx     Past Surgical History:  Procedure Laterality Date  . COMBINED HYSTEROSCOPY DIAGNOSTIC / D&C    . DIAGNOSTIC LAPAROSCOPY  1993   with laser adhesions  . INTRAUTERINE DEVICE INSERTION     mirena-Inserted 05-16-14  . KNEE SURGERY    . OOPHORECTOMY  2008   left  . ROTATOR CUFF REPAIR    . trichomoniasis  01/2013  . WRIST SURGERY     gang. cyst   Social History   Occupational History  . Not on file  Tobacco Use  . Smoking status: Never Smoker  . Smokeless tobacco: Never Used  Substance and Sexual Activity  . Alcohol use: Yes    Alcohol/week: 0.0 standard drinks    Comment: rare  . Drug use: No  . Sexual activity: Not Currently    Birth control/protection: I.U.D.    Comment: Mirena inserted 05-16-14.1st intercourse- 19, partners- 5

## 2018-12-14 LAB — PAP IG AND HPV HIGH-RISK: HPV DNA High Risk: NOT DETECTED

## 2018-12-22 ENCOUNTER — Other Ambulatory Visit: Payer: Self-pay

## 2018-12-22 ENCOUNTER — Encounter: Payer: Self-pay | Admitting: Family Medicine

## 2018-12-22 ENCOUNTER — Ambulatory Visit (INDEPENDENT_AMBULATORY_CARE_PROVIDER_SITE_OTHER): Payer: No Typology Code available for payment source | Admitting: Family Medicine

## 2018-12-22 VITALS — BP 118/84 | HR 88 | Temp 98.0°F | Ht <= 58 in | Wt 191.0 lb

## 2018-12-22 DIAGNOSIS — Z23 Encounter for immunization: Secondary | ICD-10-CM | POA: Diagnosis not present

## 2018-12-22 DIAGNOSIS — E1169 Type 2 diabetes mellitus with other specified complication: Secondary | ICD-10-CM

## 2018-12-22 DIAGNOSIS — E785 Hyperlipidemia, unspecified: Secondary | ICD-10-CM

## 2018-12-22 DIAGNOSIS — G43009 Migraine without aura, not intractable, without status migrainosus: Secondary | ICD-10-CM

## 2018-12-22 DIAGNOSIS — I10 Essential (primary) hypertension: Secondary | ICD-10-CM | POA: Diagnosis not present

## 2018-12-22 LAB — POCT GLYCOSYLATED HEMOGLOBIN (HGB A1C): Hemoglobin A1C: 6.8 % — AB (ref 4.0–5.6)

## 2018-12-22 MED ORDER — RIZATRIPTAN BENZOATE 10 MG PO TBDP: ORAL_TABLET | ORAL | 6 refills | Status: DC

## 2018-12-22 MED ORDER — METFORMIN HCL 500 MG PO TABS: ORAL_TABLET | ORAL | 1 refills | Status: DC

## 2018-12-22 MED ORDER — JANUVIA 100 MG PO TABS: ORAL_TABLET | ORAL | 1 refills | Status: DC

## 2018-12-22 MED ORDER — OLMESARTAN MEDOXOMIL-HCTZ 40-12.5 MG PO TABS: 1.0000 | ORAL_TABLET | Freq: Every day | ORAL | 3 refills | Status: DC

## 2018-12-22 MED ORDER — ROSUVASTATIN CALCIUM 10 MG PO TABS: 10.0000 mg | ORAL_TABLET | Freq: Every day | ORAL | 3 refills | Status: DC

## 2018-12-22 NOTE — Patient Instructions (Addendum)
If you have lab work done today you will be contacted with your lab results within the next 2 weeks.  If you have not heard from [place] then please contact us. The fastest way to get your results is to register for My Chart.   IF you received an x-ray today, you will receive an invoice from [facility]. Please contact [facility] at [phone] with questions or concerns regarding your invoice.   IF you received labwork today, you will receive an invoice from [facility]. Please contact [facility] at [phone] with questions or concerns regarding your invoice.   Our billing staff will not be able to assist you with questions regarding bills from these companies.  You will be contacted with the lab results as soon as they are available. The fastest way to get your results is to activate your My Chart account. Instructions are located on the last page of this paperwork. If you have not heard from [place] regarding the results in 2 weeks, please contact this office.    Form - Headache Record There are many types and causes of headaches. A headache record can help guide your treatment plan. Use this form to record the details. Bring this form with you to your follow-up visits. Follow your health care provider's instructions on how to describe your headache. You may be asked to:  Use a pain scale. This is a tool to rate the intensity of your headache using words or numbers.  Describe what your headache feels like, such as dull, achy, throbbing, or sharp. Headache record Date: _______________ Time (from start to end): ____________________ Location of the headache: _________________________  Intensity of the headache: ____________________ Description of the headache: ______________________________________________________________  Hours of sleep the night before the headache: __________  Food or drinks before the headache started:  ______________________________________________________________________________________  Events before the headache started: _______________________________________________________________________________________________  Symptoms before the headache started: __________________________________________________________________________________________  Symptoms during the headache: __________________________________________________________________________________________________  Treatment: ________________________________________________________________________________________________________________  Effect of treatment: _________________________________________________________________________________________________________  Other comments: ___________________________________________________________________________________________________________ Date: _______________ Time (from start to end): ____________________ Location of the headache: _________________________  Intensity of the headache: ____________________ Description of the headache: ______________________________________________________________  Hours of sleep the night before the headache: __________  Food or drinks before the headache started: ______________________________________________________________________________________  Events before the headache started: ____________________________________________________________________________________________  Symptoms before the headache started: _________________________________________________________________________________________  Symptoms during the headache: _______________________________________________________________________________________________  Treatment: ________________________________________________________________________________________________________________  Effect of treatment:  _________________________________________________________________________________________________________  Other comments: ___________________________________________________________________________________________________________ Date: _______________ Time (from start to end): ____________________ Location of the headache: _________________________  Intensity of the headache: ____________________ Description of the headache: ______________________________________________________________  Hours of sleep the night before the headache: __________  Food or drinks before the headache started: ______________________________________________________________________________________  Events before the headache started: ____________________________________________________________________________________________  Symptoms before the headache started: _________________________________________________________________________________________  Symptoms during the headache: _______________________________________________________________________________________________  Treatment: ________________________________________________________________________________________________________________  Effect of treatment: _________________________________________________________________________________________________________  Other comments: ___________________________________________________________________________________________________________ Date: _______________ Time (from start to end): ____________________ Location of the headache: _________________________  Intensity of the headache: ____________________ Description of the headache: ______________________________________________________________  Hours of sleep the night before the headache: _________  Food or drinks before the headache started: ______________________________________________________________________________________  Events before the  headache started: ____________________________________________________________________________________________  Symptoms before the headache started: _________________________________________________________________________________________  Symptoms during the headache: _______________________________________________________________________________________________  Treatment: ________________________________________________________________________________________________________________  Effect of treatment: _________________________________________________________________________________________________________  Other comments: ___________________________________________________________________________________________________________ Date: _______________ Time (from start to end): ____________________ Location of the headache: _________________________  Intensity of the headache: ____________________ Description of the headache: ______________________________________________________________  Hours  of sleep the night before the headache: _________  Food or drinks before the headache started: ______________________________________________________________________________________  Events before the headache started: ____________________________________________________________________________________________  Symptoms before the headache started: _________________________________________________________________________________________  Symptoms during the headache: _______________________________________________________________________________________________  Treatment: ________________________________________________________________________________________________________________  Effect of treatment: _________________________________________________________________________________________________________  Other comments:  ___________________________________________________________________________________________________________ This information is not intended to replace advice given to you by your health care provider. Make sure you discuss any questions you have with your health care provider. Document Released: 03/08/2018 Document Revised: 03/08/2018 Document Reviewed: 03/08/2018 Elsevier Patient Education  [place].

## 2018-12-22 NOTE — Progress Notes (Signed)
Established Patient Office Visit  Subjective:  Patient ID: [name], female    DOB: Mar 29, 1965  Age: 53 y.o. MRN: [id number]  CC:  Chief Complaint  Patient presents with  . Diabetes    has eye exam next month, dropped cell on left great toe, wants it chked due to being diabetic    HPI [name] presents for   Diabetes Mellitus: Patient presents for follow up of diabetes. Symptoms: none.  Patient denies foot ulcerations, hyperglycemia, hypoglycemia , nausea, polydipsia and visual disturbances.  Evaluation to date has been included: hemoglobin A1C.  Home sugars: BGs have been labile ranging between 90 and 100. Treatment to date: Continued metformin which has been effective.  Lab Results  Component Value Date   HGBA1C 6.8 (A) 12/22/2018   Hypertension: Patient here for follow-up of elevated blood pressure. She is not exercising and is adherent to low salt diet.  Blood pressure is well controlled at home. Cardiac symptoms none. Patient denies chest pain, chest pressure/discomfort, dyspnea, exertional chest pressure/discomfort, irregular heart beat, lower extremity edema, orthopnea and palpitations.  Cardiovascular risk factors: diabetes mellitus, hypertension, obesity (BMI >= 30 kg/m2) and sedentary lifestyle. Use of agents associated with hypertension: none. History of target organ damage: none. BP Readings from Last 3 Encounters:  12/22/18 118/84  12/10/18 124/84  07/27/18 132/80   Morbid Obesity- patient is not exercising  She has made some dietary changes She is limiting some fats and starches from her diet Body mass index is 39.92 kg/m. Wt Readings from Last 3 Encounters:  12/22/18 191 lb (86.6 kg)  12/13/18 191 lb (86.6 kg)  12/10/18 191 lb (86.6 kg)   Migraine She reports that she is having more migraine episodes than before She wakes up with migraines She typically gets headaches on one side of the head She treats her headaches and typically treats it  with excedrin headache She then takes the Rizatriptan   Past Medical History:  Diagnosis Date  . Arthritis   . Diabetes mellitus   . Elevated cholesterol   . Endometrial polyp   . Endometriosis   . Hypertension   . Migraines   . Ovarian cyst     Past Surgical History:  Procedure Laterality Date  . COMBINED HYSTEROSCOPY DIAGNOSTIC / D&C    . DIAGNOSTIC LAPAROSCOPY  1993   with laser adhesions  . INTRAUTERINE DEVICE INSERTION     mirena-Inserted 05-16-14  . KNEE SURGERY    . OOPHORECTOMY  2008   left  . ROTATOR CUFF REPAIR    . trichomoniasis  01/2013  . WRIST SURGERY     gang. cyst    Family History  Problem Relation Age of Onset  . Hypertension Mother   . Diabetes Mother   . COPD Mother   . Cancer Mother        Lung  . Hypertension Father   . Diabetes Sister   . Hypertension Brother   . Diabetes Maternal Grandfather   . Colon cancer Neg Hx   . Esophageal cancer Neg Hx   . Rectal cancer Neg Hx   . Stomach cancer Neg Hx     Social History   Socioeconomic History  . Marital status: Married    Spouse name: Not on file  . Number of children: Not on file  . Years of education: Not on file  . Highest education level: Not on file  Occupational History  . Not on file  Social Needs  . Financial  resource strain: Not on file  . Food insecurity    Worry: Not on file    Inability: Not on file  . Transportation needs    Medical: Not on file    Non-medical: Not on file  Tobacco Use  . Smoking status: Never Smoker  . Smokeless tobacco: Never Used  Substance and Sexual Activity  . Alcohol use: Yes    Alcohol/week: 0.0 standard drinks    Comment: rare  . Drug use: No  . Sexual activity: Not Currently    Birth control/protection: I.U.D.    Comment: Mirena inserted 05-16-14.1st intercourse- 19, partners- 5  Lifestyle  . Physical activity    Days per week: Not on file    Minutes per session: Not on file  . Stress: Not on file  Relationships  . Social  Herbalist on phone: Not on file    Gets together: Not on file    Attends religious service: Not on file    Active member of club or organization: Not on file    Attends meetings of clubs or organizations: Not on file    Relationship status: Not on file  . Intimate partner violence    Fear of current or ex partner: Not on file    Emotionally abused: Not on file    Physically abused: Not on file    Forced sexual activity: Not on file  Other Topics Concern  . Not on file  Social History Narrative  . Not on file    Outpatient Medications Prior to Visit  Medication Sig Dispense Refill  . aspirin EC 81 MG EC tablet Take 1 tablet (81 mg total) by mouth daily.    . diclofenac (VOLTAREN) 75 MG EC tablet Take 1 tablet (75 mg total) by mouth 2 (two) times daily. 60 tablet 1  . diclofenac sodium (VOLTAREN) 1 % GEL APPLY 4 GRAMS TOPICALLY 4 (FOUR) TIMES DAILY. RIGHT POSTERIOR TIBIAL TENDON 900 g 0  . estradiol (ESTRACE) 1 MG tablet Take 1 tablet (1 mg total) by mouth daily. 90 tablet 4  . furosemide (LASIX) 20 MG tablet Take 1 tablet (20 mg total) by mouth 2 (two) times daily as needed (fluid). 30 tablet 3  . nystatin-triamcinolone ointment (MYCOLOG) Apply 1 application topically 2 (two) times daily. 60 g 0  . Potassium 75 MG TABS Take 75 mg by mouth daily.     . promethazine (PHENERGAN) 25 MG tablet TAKE 1 TABLET EVERY 6 HOURS AS NEEDED FOR NAUSEA  1  . JANUVIA 100 MG tablet TAKE 1 TABLET BY MOUTH DAILY AFTER BREAKFAST.    . metFORMIN (GLUCOPHAGE) 500 MG tablet TAKE 1 TABLET BY MOUTH EVERY DAY WITH BREAKFAST 30 tablet 0  . olmesartan-hydrochlorothiazide (BENICAR HCT) 40-12.5 MG tablet TAKE 1 TABLET EVERY DAY 30 tablet 2  . rizatriptan (MAXALT-MLT) 10 MG disintegrating tablet TAKE 1 TABLET BY MOUTH AS NEEDED FOR MIGRAINE. MAY REPEAT IN 2 HOURS IF NEEDED 10 tablet 0  . rosuvastatin (CRESTOR) 10 MG tablet TAKE 1 TABLET (10 MG TOTAL) BY MOUTH DAILY AFTER BREAKFAST. 90 tablet 0    Facility-Administered Medications Prior to Visit  Medication Dose Route Frequency Provider Last Rate Last Dose  . levonorgestrel (MIRENA) 20 MCG/24HR IUD   Intrauterine Once Terrance Mass, MD        Allergies  Allergen Reactions  . Dilaudid [Hydromorphone Hcl] Other (See Comments)    Broke in sweat and started shaking, can take oral  . Sulfa Antibiotics  Hives  . Tylox [Oxycodone-Acetaminophen] Nausea And Vomiting  . Clindamycin/Lincomycin Rash    ROS Review of Systems Review of Systems  Constitutional: Negative for activity change, appetite change, chills and fever.  HENT: Negative for congestion, nosebleeds, trouble swallowing and voice change.   Respiratory: Negative for cough, shortness of breath and wheezing.   Gastrointestinal: Negative for diarrhea, nausea and vomiting.  Genitourinary: Negative for difficulty urinating, dysuria, flank pain and hematuria.  Musculoskeletal: Negative for back pain, joint swelling and neck pain. Dropped a cell phone on the great toe on left foot Neurological: Negative for dizziness, speech difficulty, light-headedness and numbness. See hpi See HPI. All other review of systems negative.     Objective:    Physical Exam  BP 118/84   Pulse 88   Temp 98 F (36.7 C)   Ht _0  (1.473 m)   Wt 191 lb (86.6 kg)   SpO2 100%   BMI 39.92 kg/m  Wt Readings from Last 3 Encounters:  12/22/18 191 lb (86.6 kg)  12/13/18 191 lb (86.6 kg)  12/10/18 191 lb (86.6 kg)   Physical Exam  Constitutional: Oriented to person, place, and time. Appears well-developed and well-nourished.  HENT:  Head: Normocephalic and atraumatic.  Eyes: Conjunctivae and EOM are normal.  Cardiovascular: Normal rate, regular rhythm, normal heart sounds and intact distal pulses.  No murmur heard. Pulmonary/Chest: Effort normal and breath sounds normal. No stridor. No respiratory distress. Has no wheezes.  Neurological: Is alert and oriented to person, place, and time.   Foot: cap refill <3s, no ulcers or wounds Skin: Skin is warm. Capillary refill takes less than 2 seconds.  Psychiatric: Has a normal mood and affect. Behavior is normal. Judgment and thought content normal.    Health Maintenance Due  Topic Date Due  . OPHTHALMOLOGY EXAM  12/04/2017    There are no preventive care reminders to display for this patient.  Lab Results  Component Value Date   TSH 2.110 06/25/2018   Lab Results  Component Value Date   WBC 9.7 06/25/2018   HGB 12.7 06/25/2018   HCT 39.5 06/25/2018   MCV 80 06/25/2018   PLT 346 06/25/2018   Lab Results  Component Value Date   NA 141 06/25/2018   K 3.9 06/25/2018   CO2 22 06/25/2018   GLUCOSE 141 (H) 06/25/2018   BUN 10 06/25/2018   CREATININE 0.74 06/25/2018   BILITOT 0.3 06/25/2018   ALKPHOS 61 06/25/2018   AST 13 06/25/2018   ALT 9 06/25/2018   PROT 7.3 06/25/2018   ALBUMIN 4.5 06/25/2018   CALCIUM 9.3 06/25/2018   Lab Results  Component Value Date   CHOL 120 06/25/2018   Lab Results  Component Value Date   HDL 56 06/25/2018   Lab Results  Component Value Date   LDLCALC 49 06/25/2018   Lab Results  Component Value Date   TRIG 76 06/25/2018   Lab Results  Component Value Date   CHOLHDL 2.1 06/25/2018   Lab Results  Component Value Date   HGBA1C 6.8 (A) 12/22/2018      Assessment & Plan:   Problem List Items Addressed This Visit      Cardiovascular and Mediastinum   Benign hypertension   Relevant Medications   olmesartan-hydrochlorothiazide (BENICAR HCT) 40-12.5 MG tablet   rosuvastatin (CRESTOR) 10 MG tablet   Other Relevant Orders   Lipid panel   CMP14+EGFR    Other Visit Diagnoses    Need for prophylactic vaccination and inoculation  against influenza    -  Primary   Relevant Orders   Flu Vaccine QUAD 36+ mos IM (Completed)   Type 2 diabetes mellitus with hyperlipidemia (HCC)       Relevant Medications   olmesartan-hydrochlorothiazide (BENICAR HCT) 40-12.5 MG tablet    metFORMIN (GLUCOPHAGE) 500 MG tablet   JANUVIA 100 MG tablet   rosuvastatin (CRESTOR) 10 MG tablet   Other Relevant Orders   POCT glycosylated hemoglobin (Hb A1C) (Completed)   Lipid panel   CMP14+EGFR   Microalbumin / creatinine urine ratio   Migraine without aura and without status migrainosus, not intractable       Relevant Medications   olmesartan-hydrochlorothiazide (BENICAR HCT) 40-12.5 MG tablet   rosuvastatin (CRESTOR) 10 MG tablet   rizatriptan (MAXALT-MLT) 10 MG disintegrating tablet     Type 2 Diabetes with hyperlipidemia  Discussed diet and exercise a1c improved Continue statin for ASCVD prevention  Morbid Obesity Goal Setting: Avoiding fatty foods Will increase exercise 0 to 10 minutes out of 1440 minutes a day  Migraines Take rizatriptan at the first sign of migraine She is advised to also continue excedrin headache for pain relief She was advised to keep a headache diary Document triggers Add exercise   Hypertension If she continues to have migraines will add a calcium channel blocker  She is not having as much fluid retention with the dietary changes  Meds ordered this encounter  Medications  . olmesartan-hydrochlorothiazide (BENICAR HCT) 40-12.5 MG tablet    Sig: Take 1 tablet by mouth daily.    Dispense:  90 tablet    Refill:  3  . metFORMIN (GLUCOPHAGE) 500 MG tablet    Sig: TAKE 1 TABLET BY MOUTH EVERY DAY WITH BREAKFAST    Dispense:  90 tablet    Refill:  1  . JANUVIA 100 MG tablet    Sig: TAKE 1 TABLET BY MOUTH DAILY AFTER BREAKFAST.    Dispense:  90 tablet    Refill:  1  . rosuvastatin (CRESTOR) 10 MG tablet    Sig: Take 1 tablet (10 mg total) by mouth daily after breakfast.    Dispense:  90 tablet    Refill:  3  . rizatriptan (MAXALT-MLT) 10 MG disintegrating tablet    Sig: TAKE 1 TABLET BY MOUTH AS NEEDED FOR MIGRAINE. MAY REPEAT IN 2 HOURS IF NEEDED    Dispense:  10 tablet    Refill:  6    Follow-up: Return in about 3 months  (around 03/24/2019) for migraine headache.    Forrest Moron, MD

## 2018-12-23 LAB — CMP14+EGFR
ALT: 5 IU/L (ref 0–32)
AST: 12 IU/L (ref 0–40)
Albumin/Globulin Ratio: 1.6 (ref 1.2–2.2)
Albumin: 4.6 g/dL (ref 3.8–4.9)
Alkaline Phosphatase: 64 IU/L (ref 39–117)
BUN/Creatinine Ratio: 12 (ref 9–23)
BUN: 9 mg/dL (ref 6–24)
Bilirubin Total: 0.4 mg/dL (ref 0.0–1.2)
CO2: 24 mmol/L (ref 20–29)
Calcium: 9.2 mg/dL (ref 8.7–10.2)
Chloride: 97 mmol/L (ref 96–106)
Creatinine, Ser: 0.74 mg/dL (ref 0.57–1.00)
GFR calc Af Amer: 108 mL/min/{1.73_m2} (ref >59)
GFR calc non Af Amer: 94 mL/min/{1.73_m2} (ref >59)
Globulin, Total: 2.8 g/dL (ref 1.5–4.5)
Glucose: 91 mg/dL (ref 65–99)
Potassium: 4.1 mmol/L (ref 3.5–5.2)
Sodium: 137 mmol/L (ref 134–144)
Total Protein: 7.4 g/dL (ref 6.0–8.5)

## 2018-12-23 LAB — LIPID PANEL
Chol/HDL Ratio: 2.1 ratio (ref 0.0–4.4)
Cholesterol, Total: 124 mg/dL (ref 100–199)
HDL: 58 mg/dL (ref >39)
LDL Chol Calc (NIH): 49 mg/dL (ref 0–99)
Triglycerides: 92 mg/dL (ref 0–149)
VLDL Cholesterol Cal: 17 mg/dL (ref 5–40)

## 2018-12-23 LAB — MICROALBUMIN / CREATININE URINE RATIO
Creatinine, Urine: 203.8 mg/dL
Microalb/Creat Ratio: 26 mg/g creat (ref 0–29)
Microalbumin, Urine: 52.9 ug/mL

## 2018-12-31 ENCOUNTER — Other Ambulatory Visit: Payer: Self-pay

## 2018-12-31 ENCOUNTER — Ambulatory Visit
Admission: RE | Admit: 2018-12-31 | Discharge: 2018-12-31 | Disposition: A | Discharge status: Home / Self Care | Payer: No Typology Code available for payment source | Source: Ambulatory Visit | Attending: Family Medicine | Admitting: Family Medicine

## 2018-12-31 DIAGNOSIS — Z1231 Encounter for screening mammogram for malignant neoplasm of breast: Secondary | ICD-10-CM

## 2019-01-05 ENCOUNTER — Telehealth: Payer: Self-pay

## 2019-01-05 NOTE — Telephone Encounter (Signed)
Patient called to discuss letter she received from mammogram result. Copy is in her chart. She wanted to discuss follow up needed.  ""Your mammogram indicates that you may have dense breast tissue.  Dense breast tissue is relatively common and is found in more than forty percent (40%) of women. The presence of dense tissue may make it more difficult to detect abnormalities in the breast and may be associated with an increased risk of breast cancer. We are providing this information to raise your awareness of this important factor and to encourage you to talk with your physician about this and other breast cancer risk factors. Together, you can decide which screening options are right for you. "

## 2019-01-06 NOTE — Telephone Encounter (Signed)
Spoke with patient and read her Dr. Dorette Grate note.

## 2019-01-06 NOTE — Telephone Encounter (Signed)
By law the radiology department has to put a statement about dense breasts.  They grade the density from A to D with the being the densest breasts.  The patient's grade was C which is a pretty common read.  I would do not recommend anything special other than I would recommend doing the 3D mammography when she has her mammograms done annually.

## 2019-01-09 ENCOUNTER — Other Ambulatory Visit: Payer: Self-pay | Admitting: Family Medicine

## 2019-01-18 ENCOUNTER — Encounter: Payer: Self-pay | Admitting: Family Medicine

## 2019-01-19 ENCOUNTER — Telehealth (INDEPENDENT_AMBULATORY_CARE_PROVIDER_SITE_OTHER): Payer: No Typology Code available for payment source | Admitting: Family Medicine

## 2019-01-19 ENCOUNTER — Other Ambulatory Visit: Payer: Self-pay

## 2019-01-19 DIAGNOSIS — F4321 Adjustment disorder with depressed mood: Secondary | ICD-10-CM

## 2019-01-19 MED ORDER — CLONAZEPAM 0.5 MG PO TABS: 0.5000 mg | ORAL_TABLET | Freq: Two times a day (BID) | ORAL | 1 refills | Status: DC | PRN

## 2019-01-19 MED ORDER — QUETIAPINE FUMARATE 50 MG PO TABS: 25.0000 mg | ORAL_TABLET | Freq: Every day | ORAL | 3 refills | Status: DC

## 2019-01-19 NOTE — Progress Notes (Signed)
Telemedicine Encounter- SOAP NOTE Established Patient  This telephone encounter was conducted with the patient's (or proxy's) verbal consent via audio telecommunications: yes/no: Yes Patient was instructed to have this encounter in a suitably private space; and to only have persons present to whom they give permission to participate. In addition, patient identity was confirmed by use of name plus two identifiers (DOB and address).  I discussed the limitations, risks, security and privacy concerns of performing an evaluation and management service by telephone and the availability of in person appointments. I also discussed with the patient that there may be a patient responsible charge related to this service. The patient expressed understanding and agreed to proceed.  I spent a total of TIME; 0 MIN TO 60 MIN: 15 minutes talking with the patient or their proxy.  CC: GRIEVING  Subjective   [name] is a 53 y.o. established patient. Telephone visit today for  HPI  She reports that she gets emotional and has chest tightness like something squeezing on the inside.  This happens when she starts crying.  She has not slept since Sunday night.  She is not eating much.  Her mother died on 2019-02-02.  She is not sleeping. She thinks about her mother all the time. She held her mother as she died.  Patient Active Problem List   Diagnosis Date Noted  . Acute right-sided low back pain with right-sided sciatica 06/10/2017  . Trochanteric bursitis, right hip 05/27/2017  . Pain in right hip 05/27/2017  . Contusion, thigh and hip, left, sequela 01/08/2017  . Low back pain 01/08/2017  . IUD (intrauterine device) in place 05/16/2014  . Atypical chest pain 11/18/2012  . Injection site extravasation of IV contrast 11/18/2012  . DM (diabetes mellitus), type 2 ([place]) 11/18/2012  . Benign hypertension 11/18/2012  . Hypokalemia 11/18/2012  . Menopause 09/16/2011  . Elevated cholesterol      Past Medical History:  Diagnosis Date  . Arthritis   . Diabetes mellitus   . Elevated cholesterol   . Endometrial polyp   . Endometriosis   . Hypertension   . Migraines   . Ovarian cyst     Current Outpatient Medications  Medication Sig Dispense Refill  . aspirin EC 81 MG EC tablet Take 1 tablet (81 mg total) by mouth daily.    . clonazePAM (KLONOPIN) 0.5 MG tablet Take 1 tablet (0.5 mg total) by mouth 2 (two) times daily as needed for anxiety. 30 tablet 1  . diclofenac (VOLTAREN) 75 MG EC tablet Take 1 tablet (75 mg total) by mouth 2 (two) times daily. 60 tablet 1  . diclofenac sodium (VOLTAREN) 1 % GEL APPLY 4 GRAMS TOPICALLY 4 (FOUR) TIMES DAILY. RIGHT POSTERIOR TIBIAL TENDON 900 g 0  . estradiol (ESTRACE) 1 MG tablet Take 1 tablet (1 mg total) by mouth daily. 90 tablet 4  . furosemide (LASIX) 20 MG tablet Take 1 tablet (20 mg total) by mouth 2 (two) times daily as needed (fluid). 30 tablet 3  . JANUVIA 100 MG tablet TAKE 1 TABLET BY MOUTH DAILY AFTER BREAKFAST. 90 tablet 1  . metFORMIN (GLUCOPHAGE) 500 MG tablet TAKE 1 TABLET BY MOUTH EVERY DAY WITH BREAKFAST 90 tablet 1  . nystatin-triamcinolone ointment (MYCOLOG) Apply 1 application topically 2 (two) times daily. 60 g 0  . olmesartan-hydrochlorothiazide (BENICAR HCT) 40-12.5 MG tablet Take 1 tablet by mouth daily. 90 tablet 3  . Potassium 75 MG TABS Take 75 mg by mouth daily.     Marland Kitchen  promethazine (PHENERGAN) 25 MG tablet TAKE 1 TABLET EVERY 6 HOURS AS NEEDED FOR NAUSEA  1  . QUEtiapine (SEROQUEL) 50 MG tablet Take 0.5-1 tablets (25-50 mg total) by mouth at bedtime. 30 tablet 3  . rizatriptan (MAXALT-MLT) 10 MG disintegrating tablet TAKE 1 TABLET BY MOUTH AS NEEDED FOR MIGRAINE. MAY REPEAT IN 2 HOURS IF NEEDED 10 tablet 6  . rosuvastatin (CRESTOR) 10 MG tablet Take 1 tablet (10 mg total) by mouth daily after breakfast. 90 tablet 3   Current Facility-Administered Medications  Medication Dose Route Frequency Provider Last Rate  Last Dose  . levonorgestrel (MIRENA) 20 MCG/24HR IUD   Intrauterine Once Terrance Mass, MD        Allergies  Allergen Reactions  . Dilaudid [Hydromorphone Hcl] Other (See Comments)    Broke in sweat and started shaking, can take oral  . Sulfa Antibiotics Hives  . Tylox [Oxycodone-Acetaminophen] Nausea And Vomiting  . Clindamycin/Lincomycin Rash    Social History   Socioeconomic History  . Marital status: Married    Spouse name: Not on file  . Number of children: Not on file  . Years of education: Not on file  . Highest education level: Not on file  Occupational History  . Not on file  Social Needs  . Financial resource strain: Not on file  . Food insecurity    Worry: Not on file    Inability: Not on file  . Transportation needs    Medical: Not on file    Non-medical: Not on file  Tobacco Use  . Smoking status: Never Smoker  . Smokeless tobacco: Never Used  Substance and Sexual Activity  . Alcohol use: Yes    Alcohol/week: 0.0 standard drinks    Comment: rare  . Drug use: No  . Sexual activity: Not Currently    Birth control/protection: I.U.D.    Comment: Mirena inserted 05-16-14.1st intercourse- 19, partners- 5  Lifestyle  . Physical activity    Days per week: Not on file    Minutes per session: Not on file  . Stress: Not on file  Relationships  . Social Herbalist on phone: Not on file    Gets together: Not on file    Attends religious service: Not on file    Active member of club or organization: Not on file    Attends meetings of clubs or organizations: Not on file    Relationship status: Not on file  . Intimate partner violence    Fear of current or ex partner: Not on file    Emotionally abused: Not on file    Physically abused: Not on file    Forced sexual activity: Not on file  Other Topics Concern  . Not on file  Social History Narrative  . Not on file    ROS Review of Systems  Constitutional: Negative for activity change,  appetite change, chills and fever.  HENT: Negative for congestion, nosebleeds, trouble swallowing and voice change.   Respiratory: Negative for cough, shortness of breath and wheezing.   Gastrointestinal: Negative for diarrhea, nausea and vomiting.  Genitourinary: Negative for difficulty urinating, dysuria, flank pain and hematuria.  Musculoskeletal: Negative for back pain, joint swelling and neck pain.  Neurological: Negative for dizziness, speech difficulty, light-headedness and numbness.  See HPI. All other review of systems negative.   Objective   Vitals as reported by the patient: There were no vitals filed for this visit.  Diagnoses and all orders for this  visit:  Grief reaction -   Advised to stay home for a week for griefing -   Advised seroquel for sleep and klonopin for anxiety Other orders -     QUEtiapine (SEROQUEL) 50 MG tablet; Take 0.5-1 tablets (25-50 mg total) by mouth at bedtime. -     clonazePAM (KLONOPIN) 0.5 MG tablet; Take 1 tablet (0.5 mg total) by mouth 2 (two) times daily as needed for anxiety.     I discussed the assessment and treatment plan with the patient. The patient was provided an opportunity to ask questions and all were answered. The patient agreed with the plan and demonstrated an understanding of the instructions.   The patient was advised to call back or seek an in-person evaluation if the symptoms worsen or if the condition fails to improve as anticipated.  I provided 15 minutes of non-face-to-face time during this encounter.  Forrest Moron, MD  Primary Care at [facility]

## 2019-02-08 ENCOUNTER — Encounter: Payer: Self-pay | Admitting: Family Medicine

## 2019-02-08 ENCOUNTER — Other Ambulatory Visit: Payer: Self-pay

## 2019-02-08 ENCOUNTER — Ambulatory Visit (INDEPENDENT_AMBULATORY_CARE_PROVIDER_SITE_OTHER): Payer: No Typology Code available for payment source | Admitting: Family Medicine

## 2019-02-08 VITALS — BP 123/82 | HR 102 | Temp 98.0°F | Ht <= 58 in | Wt 186.2 lb

## 2019-02-08 DIAGNOSIS — F4321 Adjustment disorder with depressed mood: Secondary | ICD-10-CM | POA: Diagnosis not present

## 2019-02-08 DIAGNOSIS — R634 Abnormal weight loss: Secondary | ICD-10-CM

## 2019-02-08 MED ORDER — QUETIAPINE FUMARATE 25 MG PO TABS: 12.5000 mg | ORAL_TABLET | Freq: Every day | ORAL | 0 refills | Status: DC

## 2019-02-08 NOTE — Progress Notes (Signed)
12/8/20205:25 PM  [name] 29-Aug-1965, 53 y.o., female [id number]  Chief Complaint  Patient presents with  . loosing wt    Pt stated that she notiace over the pass weekend that she has been loosing wt. Just lost her mom over the pass 3 weeks.    HPI:   Patient is a 53 y.o. femalewho presents today for grief/weight loss  Saw her PCP Dr Nolon Rod about 3 weeks ago, serqouel and klonopin Not taking serqouel - too sedating  Patient is worried that something bad is going on, anxious, losing weight She continues to relieve her mother's death, cant stop thinking about it, afraid about being left alone  Depression screen [facility] 2/9 02/08/2019 01/19/2019 12/22/2018  Decreased Interest 2 0 0  Down, Depressed, Hopeless 2 0 0  PHQ - 2 Score 4 0 0  Altered sleeping 0 - -  Tired, decreased energy 2 - -  Change in appetite 0 - -  Feeling bad or failure about yourself  0 - -  Trouble concentrating 0 - -  Moving slowly or fidgety/restless 0 - -  Suicidal thoughts 0 - -  PHQ-9 Score 6 - -  Difficult doing work/chores Not difficult at all - -    Fall Risk  02/08/2019 01/19/2019 12/22/2018 11/10/2017  Falls in the past year? 0 0 0 No  Number falls in past yr: 0 0 0 -  Injury with Fall? 0 0 0 -     Allergies  Allergen Reactions  . Dilaudid [Hydromorphone Hcl] Other (See Comments)    Broke in sweat and started shaking, can take oral  . Sulfa Antibiotics Hives  . Tylox [Oxycodone-Acetaminophen] Nausea And Vomiting  . Clindamycin/Lincomycin Rash    Prior to Admission medications   Medication Sig Start Date End Date Taking? Authorizing Provider  aspirin EC 81 MG EC tablet Take 1 tablet (81 mg total) by mouth daily. 11/19/12  Yes Vann, Jessica U, DO  clonazePAM (KLONOPIN) 0.5 MG tablet Take 1 tablet (0.5 mg total) by mouth 2 (two) times daily as needed for anxiety. 01/19/19  Yes Forrest Moron, MD  diclofenac (VOLTAREN) 75 MG EC tablet Take 1 tablet (75 mg total) by mouth 2  (two) times daily. 12/13/18  Yes Pete Pelt, PA-C  diclofenac sodium (VOLTAREN) 1 % GEL APPLY 4 GRAMS TOPICALLY 4 (FOUR) TIMES DAILY. RIGHT POSTERIOR TIBIAL TENDON 12/01/17  Yes Mcarthur Rossetti, MD  estradiol (ESTRACE) 1 MG tablet Take 1 tablet (1 mg total) by mouth daily. 12/10/18  Yes Fontaine, Belinda Block, MD  furosemide (LASIX) 20 MG tablet Take 1 tablet (20 mg total) by mouth 2 (two) times daily as needed (fluid). 11/10/17  Yes Stallings, Zoe A, MD  JANUVIA 100 MG tablet TAKE 1 TABLET BY MOUTH DAILY AFTER BREAKFAST. 12/22/18  Yes Forrest Moron, MD  levonorgestrel (MIRENA) 20 MCG/24HR IUD 1 each by Intrauterine route once.   Yes [provider]  metFORMIN (GLUCOPHAGE) 500 MG tablet TAKE 1 TABLET BY MOUTH EVERY DAY WITH BREAKFAST 12/22/18  Yes Stallings, Zoe A, MD  olmesartan-hydrochlorothiazide (BENICAR HCT) 40-12.5 MG tablet Take 1 tablet by mouth daily. 12/22/18  Yes Stallings, Zoe A, MD  Potassium 75 MG TABS Take 75 mg by mouth daily.    Yes [provider]  promethazine (PHENERGAN) 25 MG tablet TAKE 1 TABLET EVERY 6 HOURS AS NEEDED FOR NAUSEA 12/16/16  Yes [provider]  QUEtiapine (SEROQUEL) 50 MG tablet Take 0.5-1 tablets (25-50 mg total) by  mouth at bedtime. 01/19/19  Yes Stallings, Zoe A, MD  rizatriptan (MAXALT-MLT) 10 MG disintegrating tablet TAKE 1 TABLET BY MOUTH AS NEEDED FOR MIGRAINE. MAY REPEAT IN 2 HOURS IF NEEDED 12/22/18  Yes Stallings, Zoe A, MD  rosuvastatin (CRESTOR) 10 MG tablet Take 1 tablet (10 mg total) by mouth daily after breakfast. 12/22/18  Yes Stallings, Zoe A, MD  nystatin-triamcinolone ointment (MYCOLOG) Apply 1 application topically 2 (two) times daily. Patient not taking: Reported on 02/08/2019 07/27/18   Huel Cote, NP    Past Medical History:  Diagnosis Date  . Arthritis   . Diabetes mellitus   . Elevated cholesterol   . Endometrial polyp   . Endometriosis   . Hypertension   . Migraines   . Ovarian cyst      Past Surgical History:  Procedure Laterality Date  . COMBINED HYSTEROSCOPY DIAGNOSTIC / D&C    . DIAGNOSTIC LAPAROSCOPY  1993   with laser adhesions  . INTRAUTERINE DEVICE INSERTION     mirena-Inserted 05-16-14  . KNEE SURGERY    . OOPHORECTOMY  2008   left  . ROTATOR CUFF REPAIR    . trichomoniasis  01/2013  . WRIST SURGERY     gang. cyst    Social History   Tobacco Use  . Smoking status: Never Smoker  . Smokeless tobacco: Never Used  Substance Use Topics  . Alcohol use: Yes    Alcohol/week: 0.0 standard drinks    Comment: rare    Family History  Problem Relation Age of Onset  . Hypertension Mother   . Diabetes Mother   . COPD Mother   . Cancer Mother        Lung  . Hypertension Father   . Diabetes Sister   . Hypertension Brother   . Diabetes Maternal Grandfather   . Colon cancer Neg Hx   . Esophageal cancer Neg Hx   . Rectal cancer Neg Hx   . Stomach cancer Neg Hx     ROS Per hpi  OBJECTIVE:  Today's Vitals   02/08/19 1707 02/08/19 1826  BP: 123/82   Pulse: (!) 120 (!) 102  Temp: 98 F (36.7 C)   TempSrc: Temporal   SpO2: 98%   Weight: 186 lb 3.2 oz (84.5 kg)   Height: 4\' 10"  (1.473 m)    Body mass index is 38.92 kg/m.  Wt Readings from Last 3 Encounters:  02/08/19 186 lb 3.2 oz (84.5 kg)  12/22/18 191 lb (86.6 kg)  12/13/18 191 lb (86.6 kg)    Physical Exam Vitals signs and nursing note reviewed.  Constitutional:      Appearance: She is well-developed.  HENT:     Head: Normocephalic and atraumatic.  Eyes:     General: No scleral icterus.    Conjunctiva/sclera: Conjunctivae normal.     Pupils: Pupils are equal, round, and reactive to light.  Neck:     Musculoskeletal: Neck supple.  Pulmonary:     Effort: Pulmonary effort is normal.  Skin:    General: Skin is warm and dry.  Neurological:     Mental Status: She is alert and oriented to person, place, and time.     No results found for this or any previous visit (from the past  24 hour(s)).  No results found.   ASSESSMENT and PLAN  1. Grief reaction discussed stages of grief. Recommended patient reach out to hospice for grief counseling. Trial of lower dose of serqouel. Cont with klonopin prn.  2. Loss of weight - TSH  Other orders - QUEtiapine (SEROQUEL) 25 MG tablet; Take 0.5 tablets (12.5 mg total) by mouth at bedtime.  Return in about 4 weeks (around 03/08/2019) for with Dr Nolon Rod.    Rutherford Guys, MD Primary Care at [place], [place] [address] Ph.  [phone] Fax [phone]

## 2019-02-09 LAB — TSH: TSH: 2.58 u[IU]/mL (ref 0.450–4.500)

## 2019-02-14 ENCOUNTER — Ambulatory Visit (INDEPENDENT_AMBULATORY_CARE_PROVIDER_SITE_OTHER): Payer: No Typology Code available for payment source

## 2019-02-14 ENCOUNTER — Ambulatory Visit (INDEPENDENT_AMBULATORY_CARE_PROVIDER_SITE_OTHER): Payer: No Typology Code available for payment source | Admitting: Physician Assistant

## 2019-02-14 ENCOUNTER — Other Ambulatory Visit: Payer: Self-pay

## 2019-02-14 ENCOUNTER — Encounter: Payer: Self-pay | Admitting: Physician Assistant

## 2019-02-14 DIAGNOSIS — M79604 Pain in right leg: Secondary | ICD-10-CM | POA: Diagnosis not present

## 2019-02-14 NOTE — Progress Notes (Signed)
Office Visit Note   Patient: [name]           Date of Birth: [birth year]           MRN: [id number] Visit Date: 02/14/2019              Requested by: Forrest Moron, MD [place],   [id number] PCP: Forrest Moron, MD   Assessment & Plan: Visit Diagnoses:  1. Pain in right leg     Plan: Due to the fact the patient is currently having no pain recommend some general quad strengthening and back exercises along with IT band stretching exercises handout for back exercises given.  Quad strengthening and IT band stretching exercises reviewed with the patient.  She will follow-up with [place] as needed.  If her pain returns she will follow-up with [place] so that we can further evaluate this.    Follow-Up Instructions: Return if symptoms worsen or fail to improve.   Orders:  Orders Placed This Encounter  Procedures  . XR FEMUR, MIN 2 VIEWS RIGHT   No orders of the defined types were placed in this encounter.     Procedures: No procedures performed   Clinical Data: No additional findings.   Subjective: Chief Complaint  Patient presents with  . Right Thigh - Pain    HPI [name] comes in today with right thigh pain.  She states on Friday she woke up with sharp shooting pain in the mid right anterior thigh.  States this pain lasts all day.  She describes it is just sharp anterior right thigh pain no numbness tingling.  Has on and off back pain but in the past this mostly involved her left lower extremity.  She has had no new injury to the right thigh or back.  States the thigh was sore to touch initially.  Today she is having no symptoms.  Saturday morning she also had another recurrence of this sharp pain that was short-lived.  She did try some Voltaren gel.  Said no bowel bladder dysfunction. Review of Systems Negative for fevers chills shortness of breath.  Please see HPI otherwise negative  Objective: Vital Signs: There were no vitals taken for  this visit.  Physical Exam Constitutional:      Appearance: She is not ill-appearing or diaphoretic.  Cardiovascular:     Pulses: Normal pulses.  Pulmonary:     Effort: Pulmonary effort is normal.  Neurological:     Mental Status: She is alert and oriented to person, place, and time.  Psychiatric:        Mood and Affect: Mood normal.        Behavior: Behavior normal.     Ortho Exam Lumbar spine she has full flexion-extension lumbar spine without pain.  Nontender over the lumbar spinal column.  No tenderness over the paraspinous region of the lumbar spine.  Negative straight leg raise bilaterally.  Good range of motion bilateral hips without pain.  Tenderness over the trochanteric region of both hips left greater than right.  Bilateral knees full range of motion without pain.  No tenderness along medial lateral joint line of the right knee.  No instability valgus varus stressing right knee.  No abnormal warmth erythema or effusion of either knee.  Varicosities of both legs.  Right thigh compartments are soft nontender.  Nummular shaped skin discoloration could represent an early bruise.  However this area is not tender to touch. Specialty Comments:  No specialty  comments available.  Imaging: XR FEMUR, MIN 2 VIEWS RIGHT  Result Date: 02/14/2019 Right femur: AP and lateral views showed no acute findings particularly the mid to distal shaft of the femur.  Knee is well located.  Hips well located.  Calcifications of the greater trochanter [place] region.  Hip joints overall well-maintained.  No significant arthritic changes of the knee.    PMFS History: Patient Active Problem List   Diagnosis Date Noted  . Acute right-sided low back pain with right-sided sciatica 06/10/2017  . Trochanteric bursitis, right hip 05/27/2017  . Pain in right hip 05/27/2017  . Contusion, thigh and hip, left, sequela 01/08/2017  . Low back pain 01/08/2017  . IUD (intrauterine device) in place 05/16/2014  .  Atypical chest pain 11/18/2012  . Injection site extravasation of IV contrast 11/18/2012  . DM (diabetes mellitus), type 2 ([place]) 11/18/2012  . Benign hypertension 11/18/2012  . Hypokalemia 11/18/2012  . Menopause 09/16/2011  . Elevated cholesterol    Past Medical History:  Diagnosis Date  . Arthritis   . Diabetes mellitus   . Elevated cholesterol   . Endometrial polyp   . Endometriosis   . Hypertension   . Migraines   . Ovarian cyst     Family History  Problem Relation Age of Onset  . Hypertension Mother   . Diabetes Mother   . COPD Mother   . Cancer Mother        Lung  . Hypertension Father   . Diabetes Sister   . Hypertension Brother   . Diabetes Maternal Grandfather   . Colon cancer Neg Hx   . Esophageal cancer Neg Hx   . Rectal cancer Neg Hx   . Stomach cancer Neg Hx     Past Surgical History:  Procedure Laterality Date  . COMBINED HYSTEROSCOPY DIAGNOSTIC / D&C    . DIAGNOSTIC LAPAROSCOPY  1993   with laser adhesions  . INTRAUTERINE DEVICE INSERTION     mirena-Inserted 05-16-14  . KNEE SURGERY    . OOPHORECTOMY  2008   left  . ROTATOR CUFF REPAIR    . trichomoniasis  01/2013  . WRIST SURGERY     gang. cyst   Social History   Occupational History  . Not on file  Tobacco Use  . Smoking status: Never Smoker  . Smokeless tobacco: Never Used  Substance and Sexual Activity  . Alcohol use: Yes    Alcohol/week: 0.0 standard drinks    Comment: rare  . Drug use: No  . Sexual activity: Not Currently    Birth control/protection: I.U.D.    Comment: Mirena inserted 05-16-14.1st intercourse- 19, partners- 5

## 2019-02-26 LAB — HM DIABETES EYE EXAM

## 2019-03-02 ENCOUNTER — Other Ambulatory Visit: Payer: Self-pay | Admitting: Family Medicine

## 2019-03-02 NOTE — Telephone Encounter (Signed)
Requested medication (s) are due for refill today: yes  Requested medication (s) are on the active medication list:  yes  Last refill:  02/08/2019  Future visit scheduled: yes  Notes to clinic:  Patient requesting a 90 day supply   Requested Prescriptions  Pending Prescriptions Disp Refills   QUEtiapine (SEROQUEL) 25 MG tablet [Pharmacy Med Name: QUETIAPINE FUMARATE 25 MG TAB] 45 tablet 1    Sig: Take 0.5 tablets (12.5 mg total) by mouth at bedtime.      Not Delegated - Psychiatry:  Antipsychotics - Second Generation (Atypical) - quetiapine Failed - 03/02/2019  8:30 AM      Failed - This refill cannot be delegated      Passed - ALT in normal range and within 180 days    ALT  Date Value Ref Range Status  12/22/2018 5 0 - 32 IU/L Final          Passed - AST in normal range and within 180 days    AST  Date Value Ref Range Status  12/22/2018 12 0 - 40 IU/L Final          Passed - Last BP in normal range    BP Readings from Last 1 Encounters:  02/08/19 123/82          Passed - Valid encounter within last 6 months    Recent Outpatient Visits           3 weeks ago Grief reaction   [facility], Lilia Argue, MD   1 month ago Grief reaction   [facility] at [facility], Arlie Solomons, MD   2 months ago Need for prophylactic vaccination and inoculation against influenza   [facility] at [facility], Arlie Solomons, MD   8 months ago Dyslipidemia   [facility] at [place], MD   8 months ago Benign hypertension   [facility] at [place], MD       Future Appointments             In 1 week Forrest Moron, MD [facility] at [place]

## 2019-03-02 NOTE — Telephone Encounter (Signed)
Patient is requesting a refill of the following medications: Requested Prescriptions   Pending Prescriptions Disp Refills   QUEtiapine (SEROQUEL) 25 MG tablet [Pharmacy Med Name: QUETIAPINE FUMARATE 25 MG TAB] 45 tablet 1    Sig: Take 0.5 tablets (12.5 mg total) by mouth at bedtime.    Date of patient request: 03/02/19 Last office visit: 02/08/19 Date of last refill: 02/08/19 Last refill amount: 30-0rf Follow up time period per chart: 03/09/19

## 2019-03-03 ENCOUNTER — Ambulatory Visit
Admission: EM | Admit: 2019-03-03 | Discharge: 2019-03-03 | Disposition: A | Discharge status: Home / Self Care | Payer: No Typology Code available for payment source | Attending: Physician Assistant | Admitting: Physician Assistant

## 2019-03-03 ENCOUNTER — Encounter: Payer: Self-pay | Admitting: Family Medicine

## 2019-03-03 ENCOUNTER — Encounter: Payer: Self-pay | Admitting: Physician Assistant

## 2019-03-03 ENCOUNTER — Other Ambulatory Visit: Payer: Self-pay

## 2019-03-03 DIAGNOSIS — Z20822 Contact with and (suspected) exposure to covid-19: Secondary | ICD-10-CM

## 2019-03-03 DIAGNOSIS — Z20828 Contact with and (suspected) exposure to other viral communicable diseases: Secondary | ICD-10-CM

## 2019-03-03 NOTE — ED Provider Notes (Signed)
[name]    CSN: [id number] Arrival date & time: 03/03/19  1349      History   Chief Complaint Chief Complaint  Patient presents with  . covid exposure    HPI [name] is a 53 y.o. female.   53 year old female comes in for COVID testing after positive exposure. Had multiple positive exposures at work, first exposure on 12/24, last exposure yesterday. Patient remains asymptomatic. Denies URI symptoms such as cough, congestion, sore throat. Denies fever, chills, body aches. Denies abdominal pain, nausea, vomiting, diarrhea. Denies shortness of breath, loss of taste/smell. No antipyretics in the last 8 hours.      Past Medical History:  Diagnosis Date  . Arthritis   . Diabetes mellitus   . Elevated cholesterol   . Endometrial polyp   . Endometriosis   . Hypertension   . Migraines   . Ovarian cyst     Patient Active Problem List   Diagnosis Date Noted  . Acute right-sided low back pain with right-sided sciatica 06/10/2017  . Trochanteric bursitis, right hip 05/27/2017  . Pain in right hip 05/27/2017  . Contusion, thigh and hip, left, sequela 01/08/2017  . Low back pain 01/08/2017  . IUD (intrauterine device) in place 05/16/2014  . Atypical chest pain 11/18/2012  . Injection site extravasation of IV contrast 11/18/2012  . DM (diabetes mellitus), type 2 ([place]) 11/18/2012  . Benign hypertension 11/18/2012  . Hypokalemia 11/18/2012  . Menopause 09/16/2011  . Elevated cholesterol     Past Surgical History:  Procedure Laterality Date  . COMBINED HYSTEROSCOPY DIAGNOSTIC / D&C    . DIAGNOSTIC LAPAROSCOPY  1993   with laser adhesions  . INTRAUTERINE DEVICE INSERTION     mirena-Inserted 05-16-14  . KNEE SURGERY    . OOPHORECTOMY  2008   left  . ROTATOR CUFF REPAIR    . trichomoniasis  01/2013  . WRIST SURGERY     gang. cyst    OB History    Gravida  2   Para  1   Term  1   Preterm      AB  1   Living  1     SAB      TAB      Ectopic      Multiple      Live Births               Home Medications    Prior to Admission medications   Medication Sig Start Date End Date Taking? Authorizing Provider  aspirin EC 81 MG EC tablet Take 1 tablet (81 mg total) by mouth daily. 11/19/12   Geradine Girt, DO  clonazePAM (KLONOPIN) 0.5 MG tablet Take 1 tablet (0.5 mg total) by mouth 2 (two) times daily as needed for anxiety. 01/19/19   Forrest Moron, MD  diclofenac (VOLTAREN) 75 MG EC tablet Take 1 tablet (75 mg total) by mouth 2 (two) times daily. 12/13/18   Pete Pelt, PA-C  diclofenac sodium (VOLTAREN) 1 % GEL APPLY 4 GRAMS TOPICALLY 4 (FOUR) TIMES DAILY. RIGHT POSTERIOR TIBIAL TENDON 12/01/17   Mcarthur Rossetti, MD  estradiol (ESTRACE) 1 MG tablet Take 1 tablet (1 mg total) by mouth daily. 12/10/18   Fontaine, Belinda Block, MD  furosemide (LASIX) 20 MG tablet Take 1 tablet (20 mg total) by mouth 2 (two) times daily as needed (fluid). 11/10/17   Forrest Moron, MD  JANUVIA 100 MG tablet TAKE 1 TABLET  BY MOUTH DAILY AFTER BREAKFAST. 12/22/18   Forrest Moron, MD  levonorgestrel (MIRENA) 20 MCG/24HR IUD 1 each by Intrauterine route once.    [provider]  metFORMIN (GLUCOPHAGE) 500 MG tablet TAKE 1 TABLET BY MOUTH EVERY DAY WITH BREAKFAST 12/22/18   Forrest Moron, MD  nystatin-triamcinolone ointment (MYCOLOG) Apply 1 application topically 2 (two) times daily. 07/27/18   Huel Cote, NP  olmesartan-hydrochlorothiazide (BENICAR HCT) 40-12.5 MG tablet Take 1 tablet by mouth daily. 12/22/18   Forrest Moron, MD  Potassium 75 MG TABS Take 75 mg by mouth daily.     [provider]  promethazine (PHENERGAN) 25 MG tablet TAKE 1 TABLET EVERY 6 HOURS AS NEEDED FOR NAUSEA 12/16/16   [provider]  QUEtiapine (SEROQUEL) 25 MG tablet TAKE 0.5 TABLETS (12.5 MG TOTAL) BY MOUTH AT BEDTIME. 03/03/19   Rutherford Guys, MD  rizatriptan (MAXALT-MLT) 10 MG disintegrating tablet  TAKE 1 TABLET BY MOUTH AS NEEDED FOR MIGRAINE. MAY REPEAT IN 2 HOURS IF NEEDED 12/22/18   Forrest Moron, MD  rosuvastatin (CRESTOR) 10 MG tablet Take 1 tablet (10 mg total) by mouth daily after breakfast. 12/22/18   Forrest Moron, MD    Family History Family History  Problem Relation Age of Onset  . Hypertension Mother   . Diabetes Mother   . COPD Mother   . Cancer Mother        Lung  . Hypertension Father   . Diabetes Sister   . Hypertension Brother   . Diabetes Maternal Grandfather   . Colon cancer Neg Hx   . Esophageal cancer Neg Hx   . Rectal cancer Neg Hx   . Stomach cancer Neg Hx     Social History Social History   Tobacco Use  . Smoking status: Never Smoker  . Smokeless tobacco: Never Used  Substance Use Topics  . Alcohol use: Yes    Alcohol/week: 0.0 standard drinks    Comment: rare  . Drug use: No     Allergies   Dilaudid [hydromorphone hcl], Sulfa antibiotics, Tylox [oxycodone-acetaminophen], and Clindamycin/lincomycin   Review of Systems Review of Systems  Reason unable to perform ROS: See HPI as above.     Physical Exam Triage Vital Signs ED Triage Vitals [03/03/19 1417]  Enc Vitals Group     BP (!) 104/50     Pulse Rate (!) 101     Resp 18     Temp 99 F (37.2 C)     Temp Source Oral     SpO2 96 %     Weight      Height      Head Circumference      Peak Flow      Pain Score 0     Pain Loc      Pain Edu?      Excl. in [place]?    No data found.  Updated Vital Signs BP (!) 104/50 (BP Location: Left Arm)   Pulse (!) 101   Temp 99 F (37.2 C) (Oral)   Resp 18   SpO2 96%   Physical Exam Constitutional:      General: She is not in acute distress.    Appearance: Normal appearance. She is not ill-appearing, toxic-appearing or diaphoretic.  HENT:     Head: Normocephalic and atraumatic.     Mouth/Throat:     Mouth: Mucous membranes are moist.     Pharynx: Oropharynx is clear. Uvula midline.  Cardiovascular:     Rate and Rhythm:  Normal rate and regular rhythm.     Heart sounds: Normal heart sounds. No murmur. No friction rub. No gallop.   Pulmonary:     Effort: Pulmonary effort is normal. No accessory muscle usage, prolonged expiration, respiratory distress or retractions.     Comments: Lungs clear to auscultation without adventitious lung sounds. Musculoskeletal:     Cervical back: Normal range of motion and neck supple.  Neurological:     General: No focal deficit present.     Mental Status: She is alert and oriented to person, place, and time.      UC Treatments / Results  Labs (all labs ordered are listed, but only abnormal results are displayed) Labs Reviewed  NOVEL CORONAVIRUS, NAA    EKG   Radiology No results found.  Procedures Procedures (including critical care time)  Medications Ordered in UC Medications - No data to display  Initial Impression / Assessment and Plan / UC Course  I have reviewed the triage vital signs and the nursing notes.  Pertinent labs & imaging results that were available during my care of the patient were reviewed by me and considered in my medical decision making (see chart for details).    Discussed with patient, given positive exposure without symptoms, could still be within incubation period. If develop symptoms, may need retesting.  Patient expresses understanding and would like to proceed with testing.  COVID testing ordered.  Patient to quarantine until testing results return.  Return precautions given.  Final Clinical Impressions(s) / UC Diagnoses   Final diagnoses:  Close exposure to COVID-19 virus   ED Prescriptions    None     PDMP not reviewed this encounter.   Ok Edwards, PA-C 03/03/19 2045

## 2019-03-03 NOTE — Discharge Instructions (Addendum)
COVID testing ordered. Please quarantine until testing results return. As discussed, given recent exposure without symptoms, you may still be in incubation period. Monitor for any symptoms such as cough, congestion, shortness of breath, loss of taste/smell, fever, may need retesting. Go to the emergency department for further evaluation if you develop significant shortness of breath, cannot speak in full sentences.

## 2019-03-03 NOTE — ED Triage Notes (Signed)
Pt states exposed to two positive covid cases from co-workers. States first exposure on 12/24 and second one 4 days ago. Pt denies any sx's, wants tested

## 2019-03-04 HISTORY — PX: OTHER SURGICAL HISTORY: SHX169

## 2019-03-05 ENCOUNTER — Ambulatory Visit (INDEPENDENT_AMBULATORY_CARE_PROVIDER_SITE_OTHER)
Admission: RE | Admit: 2019-03-05 | Discharge: 2019-03-05 | Disposition: A | Discharge status: Home / Self Care | Payer: No Typology Code available for payment source | Source: Ambulatory Visit

## 2019-03-05 DIAGNOSIS — Z20822 Contact with and (suspected) exposure to covid-19: Secondary | ICD-10-CM

## 2019-03-05 LAB — NOVEL CORONAVIRUS, NAA: SARS-CoV-2, NAA: NOT DETECTED

## 2019-03-05 NOTE — ED Provider Notes (Signed)
Virtual Visit via Video Note:  [name]  initiated request for Telemedicine visit with [facility] team. I connected with [name]  on 03/05/2019 at 1:07 PM  for a synchronized telemedicine visit using a video enabled HIPPA compliant telemedicine application. I verified that I am speaking with [name]  using two identifiers. Jaynee Eagles, PA-C  was physically located in a [facility] Urgent care site and Hieu Krish was located at a different location.   The limitations of evaluation and management by telemedicine as well as the availability of in-person appointments were discussed. Patient was informed that she  may incur a bill ( including co-pay) for this virtual visit encounter. [name]  expressed understanding and gave verbal consent to proceed with virtual visit.     History of Present Illness:[name]  is a 54 y.o. female presents with need for clarification on her COVID-19 test results.  Patient had exposure at work from 2 different people.  She tested negative for COVID-19 and has been asymptomatic up to now.  ROS  Current Outpatient Medications  Medication Sig Dispense Refill  . aspirin EC 81 MG EC tablet Take 1 tablet (81 mg total) by mouth daily.    . clonazePAM (KLONOPIN) 0.5 MG tablet Take 1 tablet (0.5 mg total) by mouth 2 (two) times daily as needed for anxiety. 30 tablet 1  . diclofenac (VOLTAREN) 75 MG EC tablet Take 1 tablet (75 mg total) by mouth 2 (two) times daily. 60 tablet 1  . diclofenac sodium (VOLTAREN) 1 % GEL APPLY 4 GRAMS TOPICALLY 4 (FOUR) TIMES DAILY. RIGHT POSTERIOR TIBIAL TENDON 900 g 0  . estradiol (ESTRACE) 1 MG tablet Take 1 tablet (1 mg total) by mouth daily. 90 tablet 4  . furosemide (LASIX) 20 MG tablet Take 1 tablet (20 mg total) by mouth 2 (two) times daily as needed (fluid). 30 tablet 3  . JANUVIA 100 MG tablet TAKE 1 TABLET BY MOUTH DAILY AFTER BREAKFAST. 90  tablet 1  . levonorgestrel (MIRENA) 20 MCG/24HR IUD 1 each by Intrauterine route once.    . metFORMIN (GLUCOPHAGE) 500 MG tablet TAKE 1 TABLET BY MOUTH EVERY DAY WITH BREAKFAST 90 tablet 1  . nystatin-triamcinolone ointment (MYCOLOG) Apply 1 application topically 2 (two) times daily. 60 g 0  . olmesartan-hydrochlorothiazide (BENICAR HCT) 40-12.5 MG tablet Take 1 tablet by mouth daily. 90 tablet 3  . Potassium 75 MG TABS Take 75 mg by mouth daily.     . promethazine (PHENERGAN) 25 MG tablet TAKE 1 TABLET EVERY 6 HOURS AS NEEDED FOR NAUSEA  1  . QUEtiapine (SEROQUEL) 25 MG tablet TAKE 0.5 TABLETS (12.5 MG TOTAL) BY MOUTH AT BEDTIME. 45 tablet 0  . rizatriptan (MAXALT-MLT) 10 MG disintegrating tablet TAKE 1 TABLET BY MOUTH AS NEEDED FOR MIGRAINE. MAY REPEAT IN 2 HOURS IF NEEDED 10 tablet 6  . rosuvastatin (CRESTOR) 10 MG tablet Take 1 tablet (10 mg total) by mouth daily after breakfast. 90 tablet 3   Current Facility-Administered Medications  Medication Dose Route Frequency Provider Last Rate Last Admin  . levonorgestrel (MIRENA) 20 MCG/24HR IUD   Intrauterine Once Terrance Mass, MD         Allergies  Allergen Reactions  . Dilaudid [Hydromorphone Hcl] Other (See Comments)    Broke in sweat and started shaking, can take oral  . Sulfa Antibiotics Hives  . Tylox [Oxycodone-Acetaminophen] Nausea And Vomiting  . Clindamycin/Lincomycin Rash  Past Medical History:  Diagnosis Date  . Arthritis   . Diabetes mellitus   . Elevated cholesterol   . Endometrial polyp   . Endometriosis   . Hypertension   . Migraines   . Ovarian cyst     Past Surgical History:  Procedure Laterality Date  . COMBINED HYSTEROSCOPY DIAGNOSTIC / D&C    . DIAGNOSTIC LAPAROSCOPY  1993   with laser adhesions  . INTRAUTERINE DEVICE INSERTION     mirena-Inserted 05-16-14  . KNEE SURGERY    . OOPHORECTOMY  2008   left  . ROTATOR CUFF REPAIR    . trichomoniasis  01/2013  . WRIST SURGERY     gang. cyst       Observations/Objective: Physical Exam Constitutional:      General: She is not in acute distress.    Appearance: Normal appearance. She is well-developed. She is not ill-appearing, toxic-appearing or diaphoretic.  Eyes:     Extraocular Movements: Extraocular movements intact.  Pulmonary:     Effort: Pulmonary effort is normal.  Neurological:     General: No focal deficit present.     Mental Status: She is alert and oriented to person, place, and time.  Psychiatric:        Mood and Affect: Mood normal.        Behavior: Behavior normal.        Thought Content: Thought content normal.        Judgment: Judgment normal.      Assessment and Plan:  1. Exposure to COVID-19 virus    Counseled patient that she is able to return to work given negative Covid test and no symptoms.  RTW of 03/07/2019.   Follow Up Instructions:    I discussed the assessment and treatment plan with the patient. The patient was provided an opportunity to ask questions and all were answered. The patient agreed with the plan and demonstrated an understanding of the instructions.   The patient was advised to call back or seek an in-person evaluation if the symptoms worsen or if the condition fails to improve as anticipated.  I provided 10 minutes of non-face-to-face time during this encounter.    Jaynee Eagles, PA-C  03/05/2019 1:07 PM         Jaynee Eagles, PA-C 03/05/19 1335

## 2019-03-09 ENCOUNTER — Other Ambulatory Visit: Payer: Self-pay

## 2019-03-09 ENCOUNTER — Ambulatory Visit (INDEPENDENT_AMBULATORY_CARE_PROVIDER_SITE_OTHER): Payer: No Typology Code available for payment source | Admitting: Family Medicine

## 2019-03-09 ENCOUNTER — Encounter: Payer: Self-pay | Admitting: Family Medicine

## 2019-03-09 VITALS — BP 132/86 | HR 73 | Temp 97.0°F | Resp 16 | Ht <= 58 in | Wt 192.4 lb

## 2019-03-09 DIAGNOSIS — E1169 Type 2 diabetes mellitus with other specified complication: Secondary | ICD-10-CM

## 2019-03-09 DIAGNOSIS — Z135 Encounter for screening for eye and ear disorders: Secondary | ICD-10-CM

## 2019-03-09 DIAGNOSIS — E785 Hyperlipidemia, unspecified: Secondary | ICD-10-CM

## 2019-03-09 DIAGNOSIS — F4321 Adjustment disorder with depressed mood: Secondary | ICD-10-CM

## 2019-03-09 DIAGNOSIS — I10 Essential (primary) hypertension: Secondary | ICD-10-CM

## 2019-03-09 DIAGNOSIS — E6609 Other obesity due to excess calories: Secondary | ICD-10-CM | POA: Insufficient documentation

## 2019-03-09 DIAGNOSIS — E669 Obesity, unspecified: Secondary | ICD-10-CM | POA: Insufficient documentation

## 2019-03-09 DIAGNOSIS — Z6839 Body mass index (BMI) 39.0-39.9, adult: Secondary | ICD-10-CM | POA: Insufficient documentation

## 2019-03-09 DIAGNOSIS — Z6838 Body mass index (BMI) 38.0-38.9, adult: Secondary | ICD-10-CM | POA: Insufficient documentation

## 2019-03-09 LAB — POCT GLYCOSYLATED HEMOGLOBIN (HGB A1C): Hemoglobin A1C: 6.8 % — AB (ref 4.0–5.6)

## 2019-03-09 MED ORDER — CLONAZEPAM 0.5 MG PO TABS: 0.5000 mg | ORAL_TABLET | Freq: Two times a day (BID) | ORAL | 1 refills | Status: DC | PRN

## 2019-03-09 MED ORDER — JANUVIA 100 MG PO TABS: ORAL_TABLET | ORAL | 1 refills | Status: DC

## 2019-03-09 MED ORDER — QUETIAPINE FUMARATE 25 MG PO TABS: 12.5000 mg | ORAL_TABLET | Freq: Every day | ORAL | 1 refills | Status: DC

## 2019-03-09 MED ORDER — METFORMIN HCL 500 MG PO TABS: ORAL_TABLET | ORAL | 1 refills | Status: DC

## 2019-03-09 NOTE — Progress Notes (Signed)
Established Patient Office Visit  Subjective:  Patient ID: [name], female    DOB: 23-Dec-1965  Age: 54 y.o. MRN: [id number]  CC:  Chief Complaint  Patient presents with  . Medical Management of Chronic Issues    1 month f/u    HPI [name] presents for   Grief Reaction and Anxiety Patient reports that she saw Dr. Pamella Pert for her weight loss She states that she has been doing better since the death of her mother She states that she is sleeping  She reports that she takes the Klonopin and is still having a hard time with the loss of her mother. She reports that she is taking the seroquel and desires a refill  Wt Readings from Last 3 Encounters:  03/09/19 192 lb 6.4 oz (87.3 kg)  02/08/19 186 lb 3.2 oz (84.5 kg)  12/22/18 191 lb (86.6 kg)    Lab Results  Component Value Date   TSH 2.580 02/08/2019    Hypertension: Patient here for follow-up of elevated blood pressure. She is not exercising regularly and is adherent to low salt diet.  Blood pressure is well controlled at home. Cardiac symptoms none. Patient denies chest pain, claudication, exertional chest pressure/discomfort, lower extremity edema and near-syncope.  Cardiovascular risk factors: diabetes mellitus, dyslipidemia and hypertension. Use of agents associated with hypertension: none. History of target organ damage: none. BP Readings from Last 3 Encounters:  03/09/19 132/86  03/03/19 (!) 104/50  02/08/19 123/82    Diabetes Mellitus: Patient presents for follow up of diabetes. Symptoms: hyperglycemia. Symptoms have gradually improved. Patient denies increase appetite, nausea, polydipsia, polyuria and visual disturbances.  Evaluation to date has been included: hemoglobin A1C.  Home sugars: BGs are running  consistent with Hgb A1C.  She checks her blood sugars and her readings are typically 90s-140s  Lab Results  Component Value Date   HGBA1C 6.8 (A) 03/09/2019    She is taking  metformin once daily and Januvia 100mg   Past Medical History:  Diagnosis Date  . Arthritis   . Diabetes mellitus   . Elevated cholesterol   . Endometrial polyp   . Endometriosis   . Hypertension   . Migraines   . Ovarian cyst     Past Surgical History:  Procedure Laterality Date  . COMBINED HYSTEROSCOPY DIAGNOSTIC / D&C    . DIAGNOSTIC LAPAROSCOPY  1993   with laser adhesions  . INTRAUTERINE DEVICE INSERTION     mirena-Inserted 05-16-14  . KNEE SURGERY    . OOPHORECTOMY  2008   left  . ROTATOR CUFF REPAIR    . trichomoniasis  01/2013  . WRIST SURGERY     gang. cyst    Family History  Problem Relation Age of Onset  . Hypertension Mother   . Diabetes Mother   . COPD Mother   . Cancer Mother        Lung  . Hypertension Father   . Diabetes Sister   . Hypertension Brother   . Diabetes Maternal Grandfather   . Colon cancer Neg Hx   . Esophageal cancer Neg Hx   . Rectal cancer Neg Hx   . Stomach cancer Neg Hx     Social History   Socioeconomic History  . Marital status: Married    Spouse name: Not on file  . Number of children: Not on file  . Years of education: Not on file  . Highest education level: Not on file  Occupational History  . Not  on file  Tobacco Use  . Smoking status: Never Smoker  . Smokeless tobacco: Never Used  Substance and Sexual Activity  . Alcohol use: Yes    Alcohol/week: 0.0 standard drinks    Comment: rare  . Drug use: No  . Sexual activity: Not Currently    Birth control/protection: I.U.D.    Comment: Mirena inserted 05-16-14.1st intercourse- 19, partners- 5  Other Topics Concern  . Not on file  Social History Narrative  . Not on file   Social Determinants of Health   Financial Resource Strain:   . Difficulty of Paying Living Expenses: Not on file  Food Insecurity:   . Worried About Charity fundraiser in the Last Year: Not on file  . Ran Out of Food in the Last Year: Not on file  Transportation Needs:   . Lack of  Transportation (Medical): Not on file  . Lack of Transportation (Non-Medical): Not on file  Physical Activity:   . Days of Exercise per Week: Not on file  . Minutes of Exercise per Session: Not on file  Stress:   . Feeling of Stress : Not on file  Social Connections:   . Frequency of Communication with Friends and Family: Not on file  . Frequency of Social Gatherings with Friends and Family: Not on file  . Attends Religious Services: Not on file  . Active Member of Clubs or Organizations: Not on file  . Attends Archivist Meetings: Not on file  . Marital Status: Not on file  Intimate Partner Violence:   . Fear of Current or Ex-Partner: Not on file  . Emotionally Abused: Not on file  . Physically Abused: Not on file  . Sexually Abused: Not on file    Outpatient Medications Prior to Visit  Medication Sig Dispense Refill  . aspirin EC 81 MG EC tablet Take 1 tablet (81 mg total) by mouth daily.    . diclofenac (VOLTAREN) 75 MG EC tablet Take 1 tablet (75 mg total) by mouth 2 (two) times daily. 60 tablet 1  . diclofenac sodium (VOLTAREN) 1 % GEL APPLY 4 GRAMS TOPICALLY 4 (FOUR) TIMES DAILY. RIGHT POSTERIOR TIBIAL TENDON 900 g 0  . estradiol (ESTRACE) 1 MG tablet Take 1 tablet (1 mg total) by mouth daily. 90 tablet 4  . furosemide (LASIX) 20 MG tablet Take 1 tablet (20 mg total) by mouth 2 (two) times daily as needed (fluid). 30 tablet 3  . levonorgestrel (MIRENA) 20 MCG/24HR IUD 1 each by Intrauterine route once.    . nystatin-triamcinolone ointment (MYCOLOG) Apply 1 application topically 2 (two) times daily. 60 g 0  . olmesartan-hydrochlorothiazide (BENICAR HCT) 40-12.5 MG tablet Take 1 tablet by mouth daily. 90 tablet 3  . Potassium 75 MG TABS Take 75 mg by mouth daily.     . promethazine (PHENERGAN) 25 MG tablet TAKE 1 TABLET EVERY 6 HOURS AS NEEDED FOR NAUSEA  1  . rizatriptan (MAXALT-MLT) 10 MG disintegrating tablet TAKE 1 TABLET BY MOUTH AS NEEDED FOR MIGRAINE. MAY REPEAT  IN 2 HOURS IF NEEDED 10 tablet 6  . rosuvastatin (CRESTOR) 10 MG tablet Take 1 tablet (10 mg total) by mouth daily after breakfast. 90 tablet 3  . clonazePAM (KLONOPIN) 0.5 MG tablet Take 1 tablet (0.5 mg total) by mouth 2 (two) times daily as needed for anxiety. 30 tablet 1  . JANUVIA 100 MG tablet TAKE 1 TABLET BY MOUTH DAILY AFTER BREAKFAST. 90 tablet 1  . metFORMIN (GLUCOPHAGE) 500  MG tablet TAKE 1 TABLET BY MOUTH EVERY DAY WITH BREAKFAST 90 tablet 1  . QUEtiapine (SEROQUEL) 25 MG tablet TAKE 0.5 TABLETS (12.5 MG TOTAL) BY MOUTH AT BEDTIME. 45 tablet 0   Facility-Administered Medications Prior to Visit  Medication Dose Route Frequency Provider Last Rate Last Admin  . levonorgestrel (MIRENA) 20 MCG/24HR IUD   Intrauterine Once Terrance Mass, MD        Allergies  Allergen Reactions  . Dilaudid [Hydromorphone Hcl] Other (See Comments)    Broke in sweat and started shaking, can take oral  . Sulfa Antibiotics Hives  . Tylox [Oxycodone-Acetaminophen] Nausea And Vomiting  . Clindamycin/Lincomycin Rash    ROS Review of Systems See hpi Review of Systems  Constitutional: Negative for activity change, appetite change, chills and fever.  HENT: Negative for congestion, nosebleeds, trouble swallowing and voice change.   Respiratory: Negative for cough, shortness of breath and wheezing.   Gastrointestinal: Negative for diarrhea, nausea and vomiting.  Genitourinary: Negative for difficulty urinating, dysuria, flank pain and hematuria.  Musculoskeletal: Negative for back pain, joint swelling and neck pain.  Neurological: Negative for dizziness, speech difficulty, light-headedness and numbness.  See HPI. All other review of systems negative.     Objective:    Physical Exam  BP 132/86 (BP Location: Right Arm, Patient Position: Sitting, Cuff Size: Large)   Pulse 73   Temp (!) 97 F (36.1 C) (Oral)   Resp 16   Ht 4\' 10"  (1.473 m)   Wt 192 lb 6.4 oz (87.3 kg)   SpO2 96%   BMI 40.21  kg/m  Wt Readings from Last 3 Encounters:  03/09/19 192 lb 6.4 oz (87.3 kg)  02/08/19 186 lb 3.2 oz (84.5 kg)  12/22/18 191 lb (86.6 kg)   Review of Systems  Constitutional: Negative for activity change, appetite change, chills and fever.  HENT: Negative for congestion, nosebleeds, trouble swallowing and voice change.   Respiratory: Negative for cough, shortness of breath and wheezing.   Gastrointestinal: Negative for diarrhea, nausea and vomiting.  Genitourinary: Negative for difficulty urinating, dysuria, flank pain and hematuria.  Musculoskeletal: Negative for back pain, joint swelling and neck pain.  Neurological: Negative for dizziness, speech difficulty, light-headedness and numbness.  See HPI. All other review of systems negative.    There are no preventive care reminders to display for this patient.  There are no preventive care reminders to display for this patient.  Lab Results  Component Value Date   TSH 2.580 02/08/2019   Lab Results  Component Value Date   WBC 9.7 06/25/2018   HGB 12.7 06/25/2018   HCT 39.5 06/25/2018   MCV 80 06/25/2018   PLT 346 06/25/2018   Lab Results  Component Value Date   NA 137 12/22/2018   K 4.1 12/22/2018   CO2 24 12/22/2018   GLUCOSE 91 12/22/2018   BUN 9 12/22/2018   CREATININE 0.74 12/22/2018   BILITOT 0.4 12/22/2018   ALKPHOS 64 12/22/2018   AST 12 12/22/2018   ALT 5 12/22/2018   PROT 7.4 12/22/2018   ALBUMIN 4.6 12/22/2018   CALCIUM 9.2 12/22/2018   Lab Results  Component Value Date   CHOL 124 12/22/2018   Lab Results  Component Value Date   HDL 58 12/22/2018   Lab Results  Component Value Date   LDLCALC 49 12/22/2018   Lab Results  Component Value Date   TRIG 92 12/22/2018   Lab Results  Component Value Date   CHOLHDL 2.1 12/22/2018  Lab Results  Component Value Date   HGBA1C 6.8 (A) 03/09/2019      Assessment & Plan:   Problem List Items Addressed This Visit      Cardiovascular and  Mediastinum   Benign hypertension- bp improved on recheck, goal bp 130/80 or less     Other   Class 2 severe obesity due to excess calories with serious comorbidity and body mass index (BMI) of 39.0 to 39.9 in adult ([place]) Weight stable, discussed avoiding stress eating and resting to help with metabolim   Relevant Medications   metFORMIN (GLUCOPHAGE) 500 MG tablet   JANUVIA 100 MG tablet    Other Visit Diagnoses    Type 2 diabetes mellitus with hyperlipidemia ([employer])    -  Primary Diabetes improving, compliance with meds good    Relevant Medications   metFORMIN (GLUCOPHAGE) 500 MG tablet   JANUVIA 100 MG tablet   Other Relevant Orders   POCT glycosylated hemoglobin (Hb A1C) (Completed)   Screening for diabetic retinopathy       Grief reaction    -  Offered sleep aid and support Pt declined formal referral and will continue to rely on family      Meds ordered this encounter  Medications  . clonazePAM (KLONOPIN) 0.5 MG tablet    Sig: Take 1 tablet (0.5 mg total) by mouth 2 (two) times daily as needed for anxiety.    Dispense:  30 tablet    Refill:  1  . QUEtiapine (SEROQUEL) 25 MG tablet    Sig: Take 0.5 tablets (12.5 mg total) by mouth at bedtime.    Dispense:  45 tablet    Refill:  1  . metFORMIN (GLUCOPHAGE) 500 MG tablet    Sig: TAKE 1 TABLET BY MOUTH EVERY DAY WITH BREAKFAST    Dispense:  90 tablet    Refill:  1  . JANUVIA 100 MG tablet    Sig: TAKE 1 TABLET BY MOUTH DAILY AFTER BREAKFAST.    Dispense:  90 tablet    Refill:  1    Follow-up: Return in about 3 months (around 06/07/2019) for anxiety.    Forrest Moron, MD

## 2019-03-09 NOTE — Patient Instructions (Signed)
° ° ° °  If you have lab work done today you will be contacted with your lab results within the next 2 weeks.  If you have not heard from us then please contact us. The fastest way to get your results is to register for My Chart. ° ° °IF you received an x-ray today, you will receive an invoice from [facility]. Please contact [facility] at [phone] with questions or concerns regarding your invoice.  ° °IF you received labwork today, you will receive an invoice from [facility]. Please contact [facility] at [phone] with questions or concerns regarding your invoice.  ° °Our billing staff will not be able to assist you with questions regarding bills from these companies. ° °You will be contacted with the lab results as soon as they are available. The fastest way to get your results is to activate your My Chart account. Instructions are located on the last page of this paperwork. If you have not heard from us regarding the results in 2 weeks, please contact this office. °  ° ° ° °

## 2019-03-24 ENCOUNTER — Other Ambulatory Visit: Payer: Self-pay

## 2019-03-25 ENCOUNTER — Ambulatory Visit: Payer: No Typology Code available for payment source | Admitting: Obstetrics and Gynecology

## 2019-03-25 ENCOUNTER — Encounter: Payer: Self-pay | Admitting: Obstetrics and Gynecology

## 2019-03-25 VITALS — BP 124/78

## 2019-03-25 DIAGNOSIS — N898 Other specified noninflammatory disorders of vagina: Secondary | ICD-10-CM

## 2019-03-25 LAB — WET PREP FOR TRICH, YEAST, CLUE

## 2019-03-25 NOTE — Patient Instructions (Signed)
I recommend trialing probiotic yogurt consumption at least a few times a week, staying hydrated with water, wearing cotton underwear, avoiding tight fitting clothing, in addition to avoiding douching.  Please let us know if the symptoms do not improve.

## 2019-03-25 NOTE — Progress Notes (Signed)
   [name] 08-19-1965 [id number]   HPI [name] is a 54 y.o. female presenting for a vaginal odor that she has noticed for the past several days.  Not sexually active.  She does use the [name].  She has had no abnormal discharge. Slight spotting back in December but resolved on its own, but also notes her mother had suddenly passed away around that time leading to a lot of emotion and stress.  ROS:  No urinary tract symptoms. GYN ROS: As noted above.    Physical Exam BP 124/78  General: Pleasant female, no acute distress, alert and oriented PELVIC EXAM: VULVA: normal appearing vulva with no masses, tenderness or lesions, VAGINA: normal appearing vagina with normal color and discharge, no lesions, CERVIX: normal appearing cervix without discharge or lesions, no odor noted.  Wet mount negative for yeast, clue cells, trichomonas.  Few white blood cells.  Moderate bacteria.  Less than 6 epithelial cells hpf.  Assessment 54 year old G2 P1-0-1-1 presenting for vaginal odor of uncertain etiology  Plan We discussed the reassuring negative wet mount findings today.  She reports she has not sexually active so STD screening is not advised.  Her exam was essentially normal and there is no notable odor or abnormality noted.  We had a discussion regarding vaginal odor of uncertain etiology and that the vagina can have different odor depending on pH and organism conditions intravaginally, some of which may be affected by diet, hygiene practices, etc.  I recommended trialing probiotic yogurt consumption, staying hydrated, wearing cotton underwear, avoiding tight fitting clothing, in addition to avoiding douching.  She should let us know if the symptoms do not improve.  All questions were answered to her satisfaction.  Larey Days MD 03/25/19

## 2019-04-27 ENCOUNTER — Encounter: Payer: Self-pay | Admitting: Physician Assistant

## 2019-04-27 ENCOUNTER — Ambulatory Visit (INDEPENDENT_AMBULATORY_CARE_PROVIDER_SITE_OTHER): Payer: No Typology Code available for payment source

## 2019-04-27 ENCOUNTER — Other Ambulatory Visit: Payer: Self-pay

## 2019-04-27 ENCOUNTER — Ambulatory Visit (INDEPENDENT_AMBULATORY_CARE_PROVIDER_SITE_OTHER): Payer: No Typology Code available for payment source | Admitting: Physician Assistant

## 2019-04-27 DIAGNOSIS — M25522 Pain in left elbow: Secondary | ICD-10-CM

## 2019-04-27 MED ORDER — LIDOCAINE HCL 1 % IJ SOLN
1.0000 mL | INTRAMUSCULAR | Status: AC | PRN
  Administered 2019-04-27: 1 mL

## 2019-04-27 MED ORDER — METHYLPREDNISOLONE ACETATE 40 MG/ML IJ SUSP
40.0000 mg | INTRAMUSCULAR | Status: AC | PRN
  Administered 2019-04-27: 40 mg

## 2019-04-27 NOTE — Progress Notes (Signed)
Office Visit Note   Patient: [name]           Date of Birth: 1965-09-26           MRN: [id number] Visit Date: 04/27/2019              Requested by: Forrest Moron, MD [place],  [place] [id number] PCP: Forrest Moron, MD   Assessment & Plan: Visit Diagnoses:  1. Pain in left elbow     Plan: She will work on lifting techniques which are discussed with her also stretching techniques.  Monitor her glucose levels closely over the next few days.  Continue Voltaren gel to the lateral aspect the elbow.  Follow-up if pain persist or becomes worse.  Questions were encouraged and answered  Follow-Up Instructions: No follow-ups on file.   Orders:  Orders Placed This Encounter  Procedures  . Hand/UE Inj  . XR Elbow Complete Left (3+View)   No orders of the defined types were placed in this encounter.     Procedures: Hand/UE Inj: L elbow for lateral epicondylitis on 04/27/2019 4:31 PM Medications: 1 mL lidocaine 1 %; 40 mg methylPREDNISolone acetate 40 MG/ML      Clinical Data: No additional findings.   Subjective: Chief Complaint  Patient presents with  . Left Elbow - Pain    HPI [name] comes in today with new complaint of left elbow pain for the last month.  No known injury.  She denies any numbness tingling down left arm.  States it is painful to fully extend her arm or lifting objects with the left arm due to elbow pain.  Patient has a diabetic reports her last hemoglobin A1c was 6.1.  She been trying Voltaren gel over the lateral aspect of the elbow but this is gave her no relief. Review of Systems Negative for fevers, chills shortness of breath.  Please see HPI otherwise negative or noncontributory.  Objective: Vital Signs: There were no vitals taken for this visit.  Physical Exam Constitutional:      Appearance: She is not ill-appearing or diaphoretic.  Pulmonary:     Effort: Pulmonary effort is normal.  Neurological:   Mental Status: She is alert and oriented to person, place, and time.  Psychiatric:        Mood and Affect: Mood normal.     Ortho Exam Left elbow full extension full flexion.  Tenderness over the lateral epicondyle.  She has full supination pronation of the forearm.  There is no erythema ecchymosis rashes or skin lesions left elbow.  Provocative left wrist maneuvers cause no significant pain at the lateral elbow. Specialty Comments:  No specialty comments available.  Imaging: XR Elbow Complete Left (3+View)  Result Date: 04/27/2019 3 views left elbow: No acute fractures.  Elbow joints well preserved.  No subluxation dislocation of the elbow.    PMFS History: Patient Active Problem List   Diagnosis Date Noted  . Class 2 severe obesity due to excess calories with serious comorbidity and body mass index (BMI) of 39.0 to 39.9 in adult ([place]) 03/09/2019  . Acute right-sided low back pain with right-sided sciatica 06/10/2017  . Trochanteric bursitis, right hip 05/27/2017  . Pain in right hip 05/27/2017  . Contusion, thigh and hip, left, sequela 01/08/2017  . Low back pain 01/08/2017  . IUD (intrauterine device) in place 05/16/2014  . Atypical chest pain 11/18/2012  . Injection site extravasation of IV contrast 11/18/2012  . DM (diabetes  mellitus), type 2 ([place]) 11/18/2012  . Benign hypertension 11/18/2012  . Hypokalemia 11/18/2012  . Menopause 09/16/2011  . Elevated cholesterol    Past Medical History:  Diagnosis Date  . Arthritis   . Diabetes mellitus   . Elevated cholesterol   . Endometrial polyp   . Endometriosis   . Hypertension   . Migraines   . Ovarian cyst     Family History  Problem Relation Age of Onset  . Hypertension Mother   . Diabetes Mother   . COPD Mother   . Cancer Mother        Lung  . Hypertension Father   . Diabetes Sister   . Hypertension Brother   . Diabetes Maternal Grandfather   . Colon cancer Neg Hx   . Esophageal cancer Neg Hx   . Rectal  cancer Neg Hx   . Stomach cancer Neg Hx     Past Surgical History:  Procedure Laterality Date  . COMBINED HYSTEROSCOPY DIAGNOSTIC / D&C    . DIAGNOSTIC LAPAROSCOPY  1993   with laser adhesions  . INTRAUTERINE DEVICE INSERTION     mirena-Inserted 05-16-14  . KNEE SURGERY    . OOPHORECTOMY  2008   left  . ROTATOR CUFF REPAIR    . trichomoniasis  01/2013  . WRIST SURGERY     gang. cyst   Social History   Occupational History  . Not on file  Tobacco Use  . Smoking status: Never Smoker  . Smokeless tobacco: Never Used  Substance and Sexual Activity  . Alcohol use: Yes    Alcohol/week: 0.0 standard drinks    Comment: rare  . Drug use: No  . Sexual activity: Not Currently    Birth control/protection: I.U.D.    Comment: Mirena inserted 05-16-14.1st intercourse- 19, partners- 5

## 2019-05-11 ENCOUNTER — Other Ambulatory Visit: Payer: Self-pay

## 2019-05-13 ENCOUNTER — Other Ambulatory Visit: Payer: Self-pay

## 2019-05-13 ENCOUNTER — Ambulatory Visit: Payer: No Typology Code available for payment source | Admitting: Obstetrics and Gynecology

## 2019-05-13 ENCOUNTER — Ambulatory Visit
Admission: EM | Admit: 2019-05-13 | Discharge: 2019-05-13 | Disposition: A | Discharge status: Home / Self Care | Payer: No Typology Code available for payment source | Attending: Emergency Medicine | Admitting: Emergency Medicine

## 2019-05-13 DIAGNOSIS — Z20822 Contact with and (suspected) exposure to covid-19: Secondary | ICD-10-CM

## 2019-05-13 DIAGNOSIS — R0981 Nasal congestion: Secondary | ICD-10-CM

## 2019-05-13 MED ORDER — FLUTICASONE PROPIONATE 50 MCG/ACT NA SUSP: 1.0000 | Freq: Every day | NASAL | 0 refills | Status: DC

## 2019-05-13 MED ORDER — LORATADINE 10 MG PO TABS: 10.0000 mg | ORAL_TABLET | Freq: Every day | ORAL | 0 refills | Status: DC

## 2019-05-13 NOTE — ED Triage Notes (Signed)
Pt presents with complaints of sore throat and nasal congestion that started on Wednesday. Concerned for COVID. Denies any other symptoms.

## 2019-05-13 NOTE — Discharge Instructions (Addendum)
Your COVID test is pending - it is important to quarantine / isolate at home until your results are back. °If you test positive and would like further evaluation for persistent or worsening symptoms, you may schedule an E-visit or virtual (video) visit throughout the [facility] MyChart app or website. ° °PLEASE NOTE: If you develop severe chest pain or shortness of breath please go to the ER or call 9-1-1 for further evaluation --> DO NOT schedule electronic or virtual visits for this. °Please call our office for further guidance / recommendations as needed. ° °For information about the Covid vaccine, please visit [email or web address] °

## 2019-05-13 NOTE — ED Provider Notes (Signed)
[name]    CSN: [id number] Arrival date & time: 05/13/19  [id number]      History   Chief Complaint Chief Complaint  Patient presents with  . Nasal Congestion    HPI [name] is a 54 y.o. female with history of diabetes, arthritis, hypertension presenting for nasal congestion and sore throat since Wednesday.  Has tried DayQuil, NyQuil, [employer] brand of phenylephrine nasal spray with some relief.  Of note, started nasal spray today.  Denies fever, chest pain, palpitations, difficulty breathing.  Patient feels this is related to change in season which is common for her: Not taking allergy medication at this time.  States employer is requesting Covid test due to symptoms: No known exposures.   Past Medical History:  Diagnosis Date  . Arthritis   . Diabetes mellitus   . Elevated cholesterol   . Endometrial polyp   . Endometriosis   . Hypertension   . Migraines   . Ovarian cyst     Patient Active Problem List   Diagnosis Date Noted  . Class 2 severe obesity due to excess calories with serious comorbidity and body mass index (BMI) of 39.0 to 39.9 in adult ([place]) 03/09/2019  . Acute right-sided low back pain with right-sided sciatica 06/10/2017  . Trochanteric bursitis, right hip 05/27/2017  . Pain in right hip 05/27/2017  . Contusion, thigh and hip, left, sequela 01/08/2017  . Low back pain 01/08/2017  . IUD (intrauterine device) in place 05/16/2014  . Atypical chest pain 11/18/2012  . Injection site extravasation of IV contrast 11/18/2012  . DM (diabetes mellitus), type 2 ([place]) 11/18/2012  . Benign hypertension 11/18/2012  . Hypokalemia 11/18/2012  . Menopause 09/16/2011  . Elevated cholesterol     Past Surgical History:  Procedure Laterality Date  . COMBINED HYSTEROSCOPY DIAGNOSTIC / D&C    . DIAGNOSTIC LAPAROSCOPY  1993   with laser adhesions  . INTRAUTERINE DEVICE INSERTION     mirena-Inserted 05-16-14  . KNEE SURGERY    . OOPHORECTOMY  2008    left  . ROTATOR CUFF REPAIR    . trichomoniasis  01/2013  . WRIST SURGERY     gang. cyst    OB History    Gravida  2   Para  1   Term  1   Preterm      AB  1   Living  1     SAB      TAB      Ectopic      Multiple      Live Births               Home Medications    Prior to Admission medications   Medication Sig Start Date End Date Taking? Authorizing Provider  aspirin EC 81 MG EC tablet Take 1 tablet (81 mg total) by mouth daily. 11/19/12  Yes Vann, Jessica U, DO  clonazePAM (KLONOPIN) 0.5 MG tablet Take 1 tablet (0.5 mg total) by mouth 2 (two) times daily as needed for anxiety. 03/09/19  Yes Stallings, Zoe A, MD  estradiol (ESTRACE) 1 MG tablet Take 1 tablet (1 mg total) by mouth daily. 12/10/18  Yes Fontaine, Belinda Block, MD  furosemide (LASIX) 20 MG tablet Take 1 tablet (20 mg total) by mouth 2 (two) times daily as needed (fluid). 11/10/17  Yes Stallings, Zoe A, MD  JANUVIA 100 MG tablet TAKE 1 TABLET BY MOUTH DAILY AFTER BREAKFAST. 03/09/19  Yes Forrest Moron, MD  levonorgestrel (MIRENA) 20 MCG/24HR IUD 1 each by Intrauterine route once.   Yes [provider]  metFORMIN (GLUCOPHAGE) 500 MG tablet TAKE 1 TABLET BY MOUTH EVERY DAY WITH BREAKFAST 03/09/19  Yes Forrest Moron, MD  nystatin-triamcinolone ointment (MYCOLOG) Apply 1 application topically 2 (two) times daily. 07/27/18  Yes Huel Cote, NP  olmesartan-hydrochlorothiazide (BENICAR HCT) 40-12.5 MG tablet Take 1 tablet by mouth daily. 12/22/18  Yes Stallings, Zoe A, MD  promethazine (PHENERGAN) 25 MG tablet TAKE 1 TABLET EVERY 6 HOURS AS NEEDED FOR NAUSEA 12/16/16  Yes [provider]  rizatriptan (MAXALT-MLT) 10 MG disintegrating tablet TAKE 1 TABLET BY MOUTH AS NEEDED FOR MIGRAINE. MAY REPEAT IN 2 HOURS IF NEEDED 12/22/18  Yes Stallings, Zoe A, MD  rosuvastatin (CRESTOR) 10 MG tablet Take 1 tablet (10 mg total) by mouth daily after breakfast. 12/22/18  Yes Stallings, Zoe A, MD   diclofenac (VOLTAREN) 75 MG EC tablet Take 1 tablet (75 mg total) by mouth 2 (two) times daily. 12/13/18   Pete Pelt, PA-C  diclofenac sodium (VOLTAREN) 1 % GEL APPLY 4 GRAMS TOPICALLY 4 (FOUR) TIMES DAILY. RIGHT POSTERIOR TIBIAL TENDON 12/01/17   Mcarthur Rossetti, MD  fluticasone [facility]) 50 MCG/ACT nasal spray Place 1 spray into both nostrils daily. 05/13/19   Hall-Potvin, [place], PA-C  loratadine (CLARITIN) 10 MG tablet Take 1 tablet (10 mg total) by mouth daily. 05/13/19   Hall-Potvin, [place], PA-C  Potassium 75 MG TABS Take 75 mg by mouth daily.     [provider]  QUEtiapine (SEROQUEL) 25 MG tablet Take 0.5 tablets (12.5 mg total) by mouth at bedtime. 03/09/19   Forrest Moron, MD    Family History Family History  Problem Relation Age of Onset  . Hypertension Mother   . Diabetes Mother   . COPD Mother   . Cancer Mother        Lung  . Hypertension Father   . Diabetes Sister   . Hypertension Brother   . Diabetes Maternal Grandfather   . Colon cancer Neg Hx   . Esophageal cancer Neg Hx   . Rectal cancer Neg Hx   . Stomach cancer Neg Hx     Social History Social History   Tobacco Use  . Smoking status: Never Smoker  . Smokeless tobacco: Never Used  Substance Use Topics  . Alcohol use: Yes    Alcohol/week: 0.0 standard drinks    Comment: rare  . Drug use: No     Allergies   Dilaudid [hydromorphone hcl], Sulfa antibiotics, Tylox [oxycodone-acetaminophen], and Clindamycin/lincomycin   Review of Systems As per HPI   Physical Exam Triage Vital Signs ED Triage Vitals  Enc Vitals Group     BP      Pulse      Resp      Temp      Temp src      SpO2      Weight      Height      Head Circumference      Peak Flow      Pain Score      Pain Loc      Pain Edu?      Excl. in [place]?    No data found.  Updated Vital Signs BP 124/88   Pulse 86   Temp 98.7 F (37.1 C)   Resp 19   SpO2 96%   Visual Acuity Right Eye Distance:   Left  Eye Distance:  Bilateral Distance:    Right Eye Near:   Left Eye Near:    Bilateral Near:     Physical Exam Constitutional:      General: She is not in acute distress.    Appearance: She is obese. She is not ill-appearing.  HENT:     Head: Normocephalic and atraumatic.     Right Ear: Tympanic membrane, ear canal and external ear normal.     Left Ear: Tympanic membrane, ear canal and external ear normal.     Nose:     Comments: Bilateral turbinate edema with mucosal pallor.  Negative sinus tenderness bilaterally    Mouth/Throat:     Mouth: Mucous membranes are moist.     Pharynx: Oropharynx is clear. No oropharyngeal exudate or posterior oropharyngeal erythema.  Eyes:     General: No scleral icterus.    Pupils: Pupils are equal, round, and reactive to light.  Cardiovascular:     Rate and Rhythm: Normal rate and regular rhythm.  Pulmonary:     Effort: Pulmonary effort is normal. No respiratory distress.     Breath sounds: No wheezing.  Musculoskeletal:     Cervical back: No tenderness.  Lymphadenopathy:     Cervical: No cervical adenopathy.  Skin:    Coloration: Skin is not jaundiced or pale.  Neurological:     Mental Status: She is alert and oriented to person, place, and time.      UC Treatments / Results  Labs (all labs ordered are listed, but only abnormal results are displayed) Labs Reviewed  NOVEL CORONAVIRUS, NAA    EKG   Radiology No results found.  Procedures Procedures (including critical care time)  Medications Ordered in UC Medications - No data to display  Initial Impression / Assessment and Plan / UC Course  I have reviewed the triage vital signs and the nursing notes.  Pertinent labs & imaging results that were available during my care of the patient were reviewed by me and considered in my medical decision making (see chart for details).     Patient afebrile, nontoxic, with SpO2 96%.  Covid PCR pending.  Patient to quarantine until  results are back.  We will continue supportive management.  Return precautions discussed, patient verbalized understanding and is agreeable to plan. Final Clinical Impressions(s) / UC Diagnoses   Final diagnoses:  Nasal congestion     Discharge Instructions     Your COVID test is pending - it is important to quarantine / isolate at home until your results are back. If you test positive and would like further evaluation for persistent or worsening symptoms, you may schedule an E-visit or virtual (video) visit throughout the [facility] app or website.  PLEASE NOTE: If you develop severe chest pain or shortness of breath please go to the ER or call 9-1-1 for further evaluation --> DO NOT schedule electronic or virtual visits for this. Please call our office for further guidance / recommendations as needed.  For information about the Covid vaccine, please visit [email or web address]     ED Prescriptions    Medication Sig Dispense Auth. Provider   loratadine (CLARITIN) 10 MG tablet Take 1 tablet (10 mg total) by mouth daily. 30 tablet Hall-Potvin, [place], PA-C   fluticasone (FLONASE) 50 MCG/ACT nasal spray Place 1 spray into both nostrils daily. 16 g Hall-Potvin, [place], PA-C     PDMP not reviewed this encounter.   Neldon Mc Hermann, [place] 05/13/19 [id number]

## 2019-05-14 LAB — NOVEL CORONAVIRUS, NAA: SARS-CoV-2, NAA: NOT DETECTED

## 2019-05-26 ENCOUNTER — Other Ambulatory Visit: Payer: Self-pay

## 2019-05-27 ENCOUNTER — Ambulatory Visit (INDEPENDENT_AMBULATORY_CARE_PROVIDER_SITE_OTHER): Payer: No Typology Code available for payment source | Admitting: Obstetrics and Gynecology

## 2019-05-27 ENCOUNTER — Encounter: Payer: Self-pay | Admitting: Obstetrics and Gynecology

## 2019-05-27 VITALS — BP 134/84

## 2019-05-27 DIAGNOSIS — Z79890 Hormone replacement therapy (postmenopausal): Secondary | ICD-10-CM

## 2019-05-27 DIAGNOSIS — Z30432 Encounter for removal of intrauterine contraceptive device: Secondary | ICD-10-CM | POA: Diagnosis not present

## 2019-05-27 MED ORDER — PROGESTERONE MICRONIZED 100 MG PO CAPS: 100.0000 mg | ORAL_CAPSULE | Freq: Every day | ORAL | 3 refills | Status: DC

## 2019-05-27 NOTE — Progress Notes (Signed)
   [name]  11/27/1965 [id number]  HPI The patient is a 54 y.o. G2P1011 who presents today for IUD removal.  She is postmenopausal by the best ability to determine this with [place] in low menopausal range (47.7) in 2013, and she has been maintained on estradiol 1 mg daily for vasomotor symptoms..  She has used the [name] for endometrial protection.  The IUD was placed 05/2014. She states her insurance would not cover replacement of the IUD since she is menopausal so she would like to know about other options for contraception and endometrial protection.  Physical Exam  BP 134/84   General: Pleasant female, no acute distress, alert and oriented PELVIC EXAM: VULVA: normal appearing vulva with no masses, tenderness or lesions, VAGINA: normal appearing vagina with normal color and discharge, no lesions, CERVIX: normal appearing cervix without discharge or lesions, IUD strings protrude 1 cm.  Procedure note IUD removal The IUD strings are grasped with a [place] forceps and the device was removed in its entirety. The intact device was confirmed by staff. Patient tolerated the procedure well with moderate cramping afterwards.  [name] was present for exam and procedure.  Assessment 54 yo G2P1011 female, likely postmenopausal, here for IUD removal and discussion of HRT management  Plan 1. IUD was removed today without difficulty. As insurance will not cover a replacement IUD, we did have to discuss other endometrial protection options since she does take HRT. She understands that it is possible to have a hormonal withdrawal bleed. If she begins to cycle again in these upcoming months, she should let us know because this would mean she is not truly menopausal yet. It is hard to gauge menopausal status with absolute certainty since she has been amenorrheic on the IUD for many years. I think it is very unlikely for her to conceive at this point so I would not be worried about using a  true contraceptive to provide the progestin exposure. 2. HRT. She would like to continue on the estradiol 1 mg daily for vasomotor symptoms. I emphasized that it is very important to protect her endometrium from overstimulation with estrogen by continued use of a progestin. I recommend using Prometrium 100 mg daily and sent a prescription in for her. Potential side effects discussed. Should have less effect on lipid profile than synthetic progestins. She understands that Prometrium is not intended to be a contraceptive, but as mentioned above conception is unlikely at this point.  Joseph Pierini MD, FACOG 05/27/19

## 2019-06-08 ENCOUNTER — Encounter: Payer: Self-pay | Admitting: Family Medicine

## 2019-06-08 ENCOUNTER — Ambulatory Visit (INDEPENDENT_AMBULATORY_CARE_PROVIDER_SITE_OTHER): Payer: No Typology Code available for payment source | Admitting: Family Medicine

## 2019-06-08 ENCOUNTER — Other Ambulatory Visit: Payer: Self-pay

## 2019-06-08 VITALS — BP 136/99 | HR 88 | Temp 98.2°F | Resp 17 | Ht <= 58 in | Wt 194.4 lb

## 2019-06-08 DIAGNOSIS — I10 Essential (primary) hypertension: Secondary | ICD-10-CM

## 2019-06-08 DIAGNOSIS — E1169 Type 2 diabetes mellitus with other specified complication: Secondary | ICD-10-CM

## 2019-06-08 DIAGNOSIS — E785 Hyperlipidemia, unspecified: Secondary | ICD-10-CM

## 2019-06-08 LAB — POCT GLYCOSYLATED HEMOGLOBIN (HGB A1C): Hemoglobin A1C: 7.5 % — AB (ref 4.0–5.6)

## 2019-06-08 NOTE — Patient Instructions (Addendum)
  Please set up a follow up appointment for diabetes with Dr. Pamella Pert  If you have lab work done today you will be contacted with your lab results within the next 2 weeks.  If you have not heard from [place] then please contact us. The fastest way to get your results is to register for My Chart.   IF you received an x-ray today, you will receive an invoice from [facility]. Please contact [facility] at [phone] with questions or concerns regarding your invoice.   IF you received labwork today, you will receive an invoice from [place]. Please contact [facility] at [phone] with questions or concerns regarding your invoice.   Our billing staff will not be able to assist you with questions regarding bills from these companies.  You will be contacted with the lab results as soon as they are available. The fastest way to get your results is to activate your My Chart account. Instructions are located on the last page of this paperwork. If you have not heard from [place] regarding the results in 2 weeks, please contact this office.

## 2019-06-08 NOTE — Progress Notes (Signed)
Established Patient Office Visit  Subjective:  Patient ID: [name], female    DOB: [birth year]  Age: 54 y.o. MRN: [id number]  CC:  Chief Complaint  Patient presents with  . Anxiety    3 month f/u    HPI [name] presents for   Anxiety follow up She reports that she has some grief and anxiety but is getting better. She takes the prn klonopin and can go 1-2 weeks without any She is not using seroquel for sleep   Obesity She is not exercise She is planning on some inside exercise with her husband Wt Readings from Last 3 Encounters:  06/08/19 194 lb 6.4 oz (88.2 kg)  03/09/19 192 lb 6.4 oz (87.3 kg)  02/08/19 186 lb 3.2 oz (84.5 kg)     Diabetes Mellitus: Patient presents for follow up of diabetes. Symptoms: hyperglycemia. Symptoms have stabilized. Patient denies foot ulcerations, hypoglycemia , increase appetite, polydipsia and visual disturbances.  Evaluation to date has been included: hemoglobin A1C.  Home sugars: patient does not check sugars. Treatment to date: Continued metformin which has been effective.  She reports that she has been off her Januvia for a month and will go gets some from the pharmacy.  Past Medical History:  Diagnosis Date  . Arthritis   . Diabetes mellitus   . Elevated cholesterol   . Endometrial polyp   . Endometriosis   . Hypertension   . Migraines   . Ovarian cyst     Past Surgical History:  Procedure Laterality Date  . COMBINED HYSTEROSCOPY DIAGNOSTIC / D&C    . DIAGNOSTIC LAPAROSCOPY  1993   with laser adhesions  . INTRAUTERINE DEVICE INSERTION     mirena-Inserted 05-16-14  . KNEE SURGERY    . OOPHORECTOMY  2008   left  . ROTATOR CUFF REPAIR    . trichomoniasis  01/2013  . WRIST SURGERY     gang. cyst    Family History  Problem Relation Age of Onset  . Hypertension Mother   . Diabetes Mother   . COPD Mother   . Cancer Mother        Lung  . Hypertension Father   . Diabetes Sister   .  Hypertension Brother   . Diabetes Maternal Grandfather   . Colon cancer Neg Hx   . Esophageal cancer Neg Hx   . Rectal cancer Neg Hx   . Stomach cancer Neg Hx     Social History   Socioeconomic History  . Marital status: Married    Spouse name: Not on file  . Number of children: Not on file  . Years of education: Not on file  . Highest education level: Not on file  Occupational History  . Not on file  Tobacco Use  . Smoking status: Never Smoker  . Smokeless tobacco: Never Used  Substance and Sexual Activity  . Alcohol use: Yes    Alcohol/week: 0.0 standard drinks    Comment: rare  . Drug use: No  . Sexual activity: Not Currently    Birth control/protection: I.U.D.    Comment: Mirena inserted 05-16-14.1st intercourse- 19, partners- 5  Other Topics Concern  . Not on file  Social History Narrative  . Not on file   Social Determinants of Health   Financial Resource Strain:   . Difficulty of Paying Living Expenses:   Food Insecurity:   . Worried About Charity fundraiser in the Last Year:   . [employer] of  Food in the Last Year:   Transportation Needs:   . Film/video editor (Medical):   Marland Kitchen Lack of Transportation (Non-Medical):   Physical Activity:   . Days of Exercise per Week:   . Minutes of Exercise per Session:   Stress:   . Feeling of Stress :   Social Connections:   . Frequency of Communication with Friends and Family:   . Frequency of Social Gatherings with Friends and Family:   . Attends Religious Services:   . Active Member of Clubs or Organizations:   . Attends Archivist Meetings:   Marland Kitchen Marital Status:   Intimate Partner Violence:   . Fear of Current or Ex-Partner:   . Emotionally Abused:   Marland Kitchen Physically Abused:   . Sexually Abused:     Outpatient Medications Prior to Visit  Medication Sig Dispense Refill  . aspirin EC 81 MG EC tablet Take 1 tablet (81 mg total) by mouth daily.    . clonazePAM (KLONOPIN) 0.5 MG tablet Take 1 tablet (0.5 mg  total) by mouth 2 (two) times daily as needed for anxiety. 30 tablet 1  . diclofenac (VOLTAREN) 75 MG EC tablet Take 1 tablet (75 mg total) by mouth 2 (two) times daily. 60 tablet 1  . diclofenac sodium (VOLTAREN) 1 % GEL APPLY 4 GRAMS TOPICALLY 4 (FOUR) TIMES DAILY. RIGHT POSTERIOR TIBIAL TENDON 900 g 0  . estradiol (ESTRACE) 1 MG tablet Take 1 tablet (1 mg total) by mouth daily. 90 tablet 4  . fluticasone (FLONASE) 50 MCG/ACT nasal spray Place 1 spray into both nostrils daily. 16 g 0  . furosemide (LASIX) 20 MG tablet Take 1 tablet (20 mg total) by mouth 2 (two) times daily as needed (fluid). 30 tablet 3  . JANUVIA 100 MG tablet TAKE 1 TABLET BY MOUTH DAILY AFTER BREAKFAST. 90 tablet 1  . loratadine (CLARITIN) 10 MG tablet Take 1 tablet (10 mg total) by mouth daily. 30 tablet 0  . metFORMIN (GLUCOPHAGE) 500 MG tablet TAKE 1 TABLET BY MOUTH EVERY DAY WITH BREAKFAST 90 tablet 1  . nystatin-triamcinolone ointment (MYCOLOG) Apply 1 application topically 2 (two) times daily. 60 g 0  . olmesartan-hydrochlorothiazide (BENICAR HCT) 40-12.5 MG tablet Take 1 tablet by mouth daily. 90 tablet 3  . Potassium 75 MG TABS Take 75 mg by mouth daily.     . progesterone (PROMETRIUM) 100 MG capsule Take 1 capsule (100 mg total) by mouth daily. 90 capsule 3  . promethazine (PHENERGAN) 25 MG tablet TAKE 1 TABLET EVERY 6 HOURS AS NEEDED FOR NAUSEA  1  . QUEtiapine (SEROQUEL) 25 MG tablet Take 0.5 tablets (12.5 mg total) by mouth at bedtime. 45 tablet 1  . rizatriptan (MAXALT-MLT) 10 MG disintegrating tablet TAKE 1 TABLET BY MOUTH AS NEEDED FOR MIGRAINE. MAY REPEAT IN 2 HOURS IF NEEDED 10 tablet 6  . rosuvastatin (CRESTOR) 10 MG tablet Take 1 tablet (10 mg total) by mouth daily after breakfast. 90 tablet 3  . levonorgestrel (MIRENA) 20 MCG/24HR IUD 1 each by Intrauterine route once.     Facility-Administered Medications Prior to Visit  Medication Dose Route Frequency Provider Last Rate Last Admin  . levonorgestrel  (MIRENA) 20 MCG/24HR IUD   Intrauterine Once Terrance Mass, MD        Allergies  Allergen Reactions  . Dilaudid [Hydromorphone Hcl] Other (See Comments)    Broke in sweat and started shaking, can take oral  . Sulfa Antibiotics Hives  . Tylox [Oxycodone-Acetaminophen] Nausea  And Vomiting  . Clindamycin/Lincomycin Rash    ROS Review of Systems Review of Systems  Constitutional: Negative for activity change, appetite change, chills and fever.  HENT: Negative for congestion, nosebleeds, trouble swallowing and voice change.   Respiratory: Negative for cough, shortness of breath and wheezing.   Gastrointestinal: Negative for diarrhea, nausea and vomiting.  Genitourinary: Negative for difficulty urinating, dysuria, flank pain and hematuria.  Musculoskeletal: Negative for back pain, joint swelling and neck pain.  Neurological: Negative for dizziness, speech difficulty, light-headedness and numbness.  See HPI. All other review of systems negative.     Objective:    Physical Exam  BP (!) 136/99 (BP Location: Right Arm, Patient Position: Sitting, Cuff Size: Large)   Pulse 88   Temp 98.2 F (36.8 C) (Temporal)   Resp 17   Ht 4\' 10"  (1.473 m)   Wt 194 lb 6.4 oz (88.2 kg)   SpO2 98%   BMI 40.63 kg/m  Wt Readings from Last 3 Encounters:  06/08/19 194 lb 6.4 oz (88.2 kg)  03/09/19 192 lb 6.4 oz (87.3 kg)  02/08/19 186 lb 3.2 oz (84.5 kg)   Physical Exam  Constitutional: Oriented to person, place, and time. Appears well-developed and well-nourished.  HENT:  Head: Normocephalic and atraumatic.  Eyes: Conjunctivae and EOM are normal.  Cardiovascular: Normal rate, regular rhythm, normal heart sounds and intact distal pulses.  No murmur heard. Pulmonary/Chest: Effort normal and breath sounds normal. No stridor. No respiratory distress. Has no wheezes.  Neurological: Is alert and oriented to person, place, and time.  Skin: Skin is warm. Capillary refill takes less than 2 seconds.   Psychiatric: Has a normal mood and affect. Behavior is normal. Judgment and thought content normal.    There are no preventive care reminders to display for this patient.  There are no preventive care reminders to display for this patient.  Lab Results  Component Value Date   TSH 2.580 02/08/2019   Lab Results  Component Value Date   WBC 9.7 06/25/2018   HGB 12.7 06/25/2018   HCT 39.5 06/25/2018   MCV 80 06/25/2018   PLT 346 06/25/2018   Lab Results  Component Value Date   NA 140 06/08/2019   K 4.2 06/08/2019   CO2 27 06/08/2019   GLUCOSE 96 06/08/2019   BUN 10 06/08/2019   CREATININE 0.69 06/08/2019   BILITOT 0.3 06/08/2019   ALKPHOS 71 06/08/2019   AST 16 06/08/2019   ALT 6 06/08/2019   PROT 7.8 06/08/2019   ALBUMIN 4.5 06/08/2019   CALCIUM 9.8 06/08/2019   Lab Results  Component Value Date   CHOL 124 12/22/2018   Lab Results  Component Value Date   HDL 58 12/22/2018   Lab Results  Component Value Date   LDLCALC 49 12/22/2018   Lab Results  Component Value Date   TRIG 92 12/22/2018   Lab Results  Component Value Date   CHOLHDL 2.1 12/22/2018   Lab Results  Component Value Date   HGBA1C 7.5 (A) 06/08/2019      Assessment & Plan:   Problem List Items Addressed This Visit      Cardiovascular and Mediastinum   Benign hypertension  - Patient's blood pressure is at goal of 139/89 or less. Condition is stable. Continue current medications and treatment plan. I recommend that you exercise for 30-45 minutes 5 days a week. I also recommend a balanced diet with fruits and vegetables every day, lean meats, and little fried foods. The  DASH diet (you can find this online) is a good example of this.    Relevant Orders   Comprehensive metabolic panel (Completed)    Other Visit Diagnoses    Type 2 diabetes mellitus with hyperlipidemia ([place])    -  Primary well controlled hemoglobin a1c is at goal Continue exercise Lipids monitored and renal function in  range On metformin On asa 81mg  Reviewed diabetic foot care Emphasized importance of eye and dental exam      Relevant Orders   POCT glycosylated hemoglobin (Hb A1C) (Completed)   Comprehensive metabolic panel (Completed)   Dyslipidemia    - Discussed medications that affect lipids Reminded patient to avoid grapefruits Reviewed last 3 lipids Discussed current meds: statin, aspirin Advised dietary fiber and fish oil and ways to keep HDL high CAD prevention and reviewed side effects of statins    Relevant Orders   Comprehensive metabolic panel (Completed)      No orders of the defined types were placed in this encounter.   Follow-up: Return Please set up a follow up appointment for diabetes with Dr. Pamella Pert.    Forrest Moron, MD

## 2019-06-09 ENCOUNTER — Encounter: Payer: Self-pay | Admitting: Family Medicine

## 2019-06-09 LAB — COMPREHENSIVE METABOLIC PANEL
ALT: 6 IU/L (ref 0–32)
AST: 16 IU/L (ref 0–40)
Albumin/Globulin Ratio: 1.4 (ref 1.2–2.2)
Albumin: 4.5 g/dL (ref 3.8–4.9)
Alkaline Phosphatase: 71 IU/L (ref 39–117)
BUN/Creatinine Ratio: 14 (ref 9–23)
BUN: 10 mg/dL (ref 6–24)
Bilirubin Total: 0.3 mg/dL (ref 0.0–1.2)
CO2: 27 mmol/L (ref 20–29)
Calcium: 9.8 mg/dL (ref 8.7–10.2)
Chloride: 97 mmol/L (ref 96–106)
Creatinine, Ser: 0.69 mg/dL (ref 0.57–1.00)
GFR calc Af Amer: 117 mL/min/{1.73_m2} (ref >59)
GFR calc non Af Amer: 101 mL/min/{1.73_m2} (ref >59)
Globulin, Total: 3.3 g/dL (ref 1.5–4.5)
Glucose: 96 mg/dL (ref 65–99)
Potassium: 4.2 mmol/L (ref 3.5–5.2)
Sodium: 140 mmol/L (ref 134–144)
Total Protein: 7.8 g/dL (ref 6.0–8.5)

## 2019-06-09 NOTE — Telephone Encounter (Signed)
Pt is requesting a lab result interpretation on recent lab work.   Please advise

## 2019-06-10 ENCOUNTER — Ambulatory Visit: Payer: No Typology Code available for payment source | Attending: Internal Medicine

## 2019-06-10 DIAGNOSIS — Z23 Encounter for immunization: Secondary | ICD-10-CM

## 2019-06-10 NOTE — Progress Notes (Signed)
   Covid-19 Vaccination Clinic  Name:  [name]    MRN: [id number] DOB: [birth year]  06/10/2019  Ms. [name] was observed post Covid-19 immunization for 15 minutes without incident. She was provided with Vaccine Information Sheet and instruction to access the V-Safe system.   Ms. [name] was instructed to call 911 with any severe reactions post vaccine: Marland Kitchen Difficulty breathing  . Swelling of face and throat  . A fast heartbeat  . A bad rash all over body  . Dizziness and weakness   Immunizations Administered    Name Date Dose VIS Date Route   Pfizer COVID-19 Vaccine 06/10/2019  3:05 PM 0.3 mL 02/11/2019 Intramuscular   Manufacturer: [employer], [employer]   Lot: [id number]   [place]: [id number]

## 2019-07-06 ENCOUNTER — Ambulatory Visit: Payer: No Typology Code available for payment source | Attending: Internal Medicine

## 2019-07-06 DIAGNOSIS — Z23 Encounter for immunization: Secondary | ICD-10-CM

## 2019-07-06 NOTE — Progress Notes (Signed)
   Covid-19 Vaccination Clinic  Name:  [name]    MRN: [id number] DOB: Apr 18, 1965  07/06/2019  [name] was observed post Covid-19 immunization for 15 minutes without incident. She was provided with Vaccine Information Sheet and instruction to access the V-Safe system.   [name] was instructed to call 911 with any severe reactions post vaccine: Marland Kitchen Difficulty breathing  . Swelling of face and throat  . A fast heartbeat  . A bad rash all over body  . Dizziness and weakness   Immunizations Administered    Name Date Dose VIS Date Route   Pfizer COVID-19 Vaccine 07/06/2019  4:56 PM 0.3 mL 04/27/2018 Intramuscular   Manufacturer: [place]   Lot: [id number]   [place]: [id number]

## 2019-07-14 ENCOUNTER — Ambulatory Visit: Payer: No Typology Code available for payment source | Admitting: Family Medicine

## 2019-09-01 ENCOUNTER — Telehealth: Payer: Self-pay

## 2019-09-01 NOTE — Telephone Encounter (Signed)
Patient advised. She is taking the Estradiol and Prometrium as you prescribed.  She has a couple of questions.   "Will he test me for menopause again?"  If I continue to have cycles and am not menopausal can I get another iud? Since would be for birth control.

## 2019-09-01 NOTE — Telephone Encounter (Signed)
We can check the [facility] again.  If continuing to have cycles I would recommend checking with insurance to see if they would cover another [name] as that would make sense for birth control purposes.  I just wonder if checking FSH again (if it is higher and further into the menopausal range this time), this would give insurance more of a reason to not want to cover the IUD.

## 2019-09-01 NOTE — Telephone Encounter (Signed)
Patient had IUD removed in March. Today she started bleeding like her period and she said she was supposed to be menopausal.

## 2019-09-01 NOTE — Telephone Encounter (Signed)
From my last note: IUD was removed today without difficulty. As insurance will not cover a replacement IUD, we did have to discuss other endometrial protection options since she does take HRT. She understands that it is possible to have a hormonal withdrawal bleed. If she begins to cycle again in these upcoming months, she should let us know because this would mean she is not truly menopausal yet. It is hard to gauge menopausal status with absolute certainty since she has been amenorrheic on the IUD for many years.  We will have to see how she does in the upcoming months if continuing to have bleeding, irregular bleeding, or especially if any excessive prolonged bleeding, she should let us know.  She is still taking estradiol?  If she is on estradiol still, she is taking the Prometrium I prescribed to her, correct?

## 2019-09-02 NOTE — Telephone Encounter (Signed)
[name] said her insurance will not cover the IUD if she is menopausal.  I don't think the level of [place] has a bearing on that.  Can FSH be checked while taking the Estradiol?   (I was not sure if that would affect result.)

## 2019-09-02 NOTE — Telephone Encounter (Signed)
I spoke with patient and told her to keep an eye on her bleeding the next few months. If regular cycles she is not menopausal and will not need [place] level checked. If she continues with irregular bleeding over the next few mos she will need a visit and Dr. Delilah Shan can decide at that time if [facility] level is appropriate.  She said she has only spotted since yesterday. I reassured her that some bleeding after IUD removal is normal.

## 2019-09-02 NOTE — Telephone Encounter (Signed)
Just to clarify she has not been having cycles. Yesterday was the first bleeding she has had since the IUD was removed. Do you think she should wait and see if cycles resume?  Or check FSH level now?

## 2019-09-02 NOTE — Telephone Encounter (Signed)
You are right it might affect the lab. But if she is cycling like a period then she is not menopausal, so don't really need to check the [facility]. If she is having an abnormal bleeding pattern (not roughly monthly) then we need to investigate that further with an appointment.

## 2019-09-14 ENCOUNTER — Other Ambulatory Visit: Payer: Self-pay

## 2019-09-14 ENCOUNTER — Encounter: Payer: Self-pay | Admitting: Obstetrics and Gynecology

## 2019-09-14 ENCOUNTER — Ambulatory Visit: Payer: No Typology Code available for payment source | Admitting: Obstetrics and Gynecology

## 2019-09-14 VITALS — BP 124/82

## 2019-09-14 DIAGNOSIS — N950 Postmenopausal bleeding: Secondary | ICD-10-CM

## 2019-09-14 NOTE — Progress Notes (Signed)
[name] Aug 27, 1965 [id number]  SUBJECTIVE:  54 y.o. G30P1011 female presents for evaluation of abnormal uterine bleeding.  Menopausal status is questionable, she was on [name] since 2016 and amenorrheic.  The IUD was removed 05/2019.  She is also on estradiol 1 mg daily for vasomotor symptoms.  The only indicator of menopausal status is a slightly elevated FSH level (47.7) in 2013.  She has had about 2 weeks of light bleeding and called in to notify us and we suggested that she come into the office to get checked out.  No pain or cramping.  Current Outpatient Medications  Medication Sig Dispense Refill  . aspirin EC 81 MG EC tablet Take 1 tablet (81 mg total) by mouth daily.    . clonazePAM (KLONOPIN) 0.5 MG tablet Take 1 tablet (0.5 mg total) by mouth 2 (two) times daily as needed for anxiety. 30 tablet 1  . diclofenac (VOLTAREN) 75 MG EC tablet Take 1 tablet (75 mg total) by mouth 2 (two) times daily. 60 tablet 1  . diclofenac sodium (VOLTAREN) 1 % GEL APPLY 4 GRAMS TOPICALLY 4 (FOUR) TIMES DAILY. RIGHT POSTERIOR TIBIAL TENDON 900 g 0  . estradiol (ESTRACE) 1 MG tablet Take 1 tablet (1 mg total) by mouth daily. 90 tablet 4  . furosemide (LASIX) 20 MG tablet Take 1 tablet (20 mg total) by mouth 2 (two) times daily as needed (fluid). 30 tablet 3  . JANUVIA 100 MG tablet TAKE 1 TABLET BY MOUTH DAILY AFTER BREAKFAST. 90 tablet 1  . loratadine (CLARITIN) 10 MG tablet Take 1 tablet (10 mg total) by mouth daily. 30 tablet 0  . metFORMIN (GLUCOPHAGE) 500 MG tablet TAKE 1 TABLET BY MOUTH EVERY DAY WITH BREAKFAST 90 tablet 1  . nystatin-triamcinolone ointment (MYCOLOG) Apply 1 application topically 2 (two) times daily. 60 g 0  . olmesartan-hydrochlorothiazide (BENICAR HCT) 40-12.5 MG tablet Take 1 tablet by mouth daily. 90 tablet 3  . Potassium 75 MG TABS Take 75 mg by mouth daily.     . progesterone (PROMETRIUM) 100 MG capsule Take 1 capsule (100 mg total) by mouth daily. 90 capsule 3    . QUEtiapine (SEROQUEL) 25 MG tablet Take 0.5 tablets (12.5 mg total) by mouth at bedtime. 45 tablet 1  . rizatriptan (MAXALT-MLT) 10 MG disintegrating tablet TAKE 1 TABLET BY MOUTH AS NEEDED FOR MIGRAINE. MAY REPEAT IN 2 HOURS IF NEEDED 10 tablet 6  . rosuvastatin (CRESTOR) 10 MG tablet Take 1 tablet (10 mg total) by mouth daily after breakfast. 90 tablet 3  . fluticasone (FLONASE) 50 MCG/ACT nasal spray Place 1 spray into both nostrils daily. (Patient not taking: Reported on 09/14/2019) 16 g 0  . promethazine (PHENERGAN) 25 MG tablet TAKE 1 TABLET EVERY 6 HOURS AS NEEDED FOR NAUSEA (Patient not taking: Reported on 09/14/2019)  1   Current Facility-Administered Medications  Medication Dose Route Frequency Provider Last Rate Last Admin  . levonorgestrel (MIRENA) 20 MCG/24HR IUD   Intrauterine Once Terrance Mass, MD       Allergies: Dilaudid [hydromorphone hcl], Sulfa antibiotics, Tylox [oxycodone-acetaminophen], and Clindamycin/lincomycin  Patient's last menstrual period was 03/04/2007.  Past medical history,surgical history, problem list, medications, allergies, family history and social history were all reviewed and documented as reviewed in the EPIC chart.  ROS:  Feeling well. No dyspnea or chest pain on exertion.  No abdominal pain, change in bowel habits, black or bloody stools.  No urinary tract symptoms. GYN ROS: As described in HPI  OBJECTIVE:  BP 124/82   LMP 03/04/2007  The patient appears well, alert, oriented x 3, in no distress. PELVIC EXAM: Deferred to future visit   ASSESSMENT:  54 y.o. G2P1011 here for evaluation of abnormal uterine bleeding, possibly postmenopausal bleeding  PLAN:  Since she is on HRT, the utility of rechecking [place] is limited since this level may be affected by the hormones she is taking, and the lab test is not entirely diagnostic of menopausal status anyway (best to go by 1 year of amenorrhea, but she is on HRT and now currently having bleeding).   I did confirm with her that she is taking both the estradiol in addition to the Prometrium nightly.  We discussed potential issues and estrogen can stimulate uterine lining growth and increased risk for hyperplasia and cancer.  However more commonly there is just benign proliferation of endometrium that is leading to the bleeding.  It would be safest to treat this as a postmenopausal bleeding work-up so I would like her to come back for pelvic ultrasound and an endometrial biopsy.  Recommended taking ibuprofen 600 mg prior to the procedure.  She is not ready to discontinue HRT yet due to concern for menopausal symptoms.  Okay to continue with HRT for right now since we will be commencing work-up here shortly.  If next steps are nondiagnostic and bleeding continues, we may need to proceed to hysteroscopy D&C.  We will plan to readjust her Prometrium dosing to see if we can get her bleeding symptoms to stop.   Joseph Pierini MD 09/14/19

## 2019-09-15 ENCOUNTER — Encounter: Payer: No Typology Code available for payment source | Admitting: Family Medicine

## 2019-09-19 ENCOUNTER — Telehealth: Payer: Self-pay | Admitting: *Deleted

## 2019-09-19 ENCOUNTER — Other Ambulatory Visit: Payer: Self-pay | Admitting: Nurse Practitioner

## 2019-09-19 DIAGNOSIS — N939 Abnormal uterine and vaginal bleeding, unspecified: Secondary | ICD-10-CM

## 2019-09-19 MED ORDER — MEGESTROL ACETATE 40 MG PO TABS: 40.0000 mg | ORAL_TABLET | Freq: Two times a day (BID) | ORAL | 0 refills | Status: AC

## 2019-09-19 NOTE — Telephone Encounter (Signed)
Patient informed with below.  

## 2019-09-19 NOTE — Telephone Encounter (Addendum)
Patient saw Dr.Kendall on 09/14/19 for postmenopausal bleeding, called today stating the bleeding has increased to heavy. Wearing 2 pads having to change every 30 mins, reports the bleeding started on 09/01/19, but was not this heavy. She asked if Rx could be sent to pharmacy to help slow down/stop bleeding. She is taking estradiol 1 mg tablet and progesterone 100 mg tablet, no missed pills.  Please advise

## 2019-09-19 NOTE — Telephone Encounter (Signed)
Megace 40 mg twice a day for 10 days sent to her pharmacy.

## 2019-09-27 ENCOUNTER — Telehealth: Payer: Self-pay | Admitting: *Deleted

## 2019-09-27 NOTE — Telephone Encounter (Signed)
Patient is scheduled for SHGM/Bx on 10/06/19 received a call today regarding the price of procedure and reports she has not met her deductible yet. Patient reports at this time she can afford and asked if any other options besides the SHGM/Bx at this time? Please advise

## 2019-09-28 NOTE — Telephone Encounter (Signed)
At the very least a pelvic ultrasound is needed, but if the lining appears thickened, an endometrial biopsy is recommended.  We have to be very careful when taking hormone therapy that we are not overstimulating the uterine lining because that can cause hyperplasia or cancer.

## 2019-09-29 NOTE — Telephone Encounter (Signed)
Left message for patient to call.

## 2019-09-29 NOTE — Telephone Encounter (Signed)
Patient informed with below note. 

## 2019-10-06 ENCOUNTER — Encounter: Payer: Self-pay | Admitting: Obstetrics and Gynecology

## 2019-10-06 ENCOUNTER — Ambulatory Visit (INDEPENDENT_AMBULATORY_CARE_PROVIDER_SITE_OTHER): Payer: No Typology Code available for payment source | Admitting: Obstetrics and Gynecology

## 2019-10-06 ENCOUNTER — Ambulatory Visit (INDEPENDENT_AMBULATORY_CARE_PROVIDER_SITE_OTHER): Payer: No Typology Code available for payment source

## 2019-10-06 ENCOUNTER — Other Ambulatory Visit: Payer: Self-pay

## 2019-10-06 VITALS — BP 134/80

## 2019-10-06 DIAGNOSIS — N950 Postmenopausal bleeding: Secondary | ICD-10-CM | POA: Diagnosis not present

## 2019-10-06 DIAGNOSIS — R9389 Abnormal findings on diagnostic imaging of other specified body structures: Secondary | ICD-10-CM | POA: Diagnosis not present

## 2019-10-06 DIAGNOSIS — Z79890 Hormone replacement therapy (postmenopausal): Secondary | ICD-10-CM | POA: Diagnosis not present

## 2019-10-06 DIAGNOSIS — D219 Benign neoplasm of connective and other soft tissue, unspecified: Secondary | ICD-10-CM

## 2019-10-06 DIAGNOSIS — N939 Abnormal uterine and vaginal bleeding, unspecified: Secondary | ICD-10-CM

## 2019-10-06 NOTE — Progress Notes (Signed)
   [name] 52-Jul-1967 [id number]  SUBJECTIVE:  54 y.o. G79P1011 female presents for a sonohysterogram to work-up abnormal uterine bleeding, possibly postmenopausal.  Please see the previous note for details.  She is on estradiol 1 mg daily for vasomotor symptoms and also takes Prometrium 100 mg nightly.  She had a [name] removed 05/2019.  No bleeding in the interval since her last visit 3 weeks ago.  Allergies: Dilaudid [hydromorphone hcl], Sulfa antibiotics, Tylox [oxycodone-acetaminophen], and Clindamycin/lincomycin  Patient's last menstrual period was 03/04/2007.  Past medical history,surgical history, problem list, medications, allergies, family history and social history were all reviewed and documented as reviewed in the EPIC chart.  ROS:  Feeling well. No dyspnea or chest pain on exertion.  No abdominal pain, change in bowel habits, black or bloody stools.  No urinary tract symptoms. GYN ROS: As described in HPI   OBJECTIVE:  BP 134/80 (BP Location: Right Arm, Patient Position: Sitting, Cuff Size: Normal)   LMP 03/04/2007  The patient appears well, alert, oriented x 3, in no distress. PELVIC EXAM: VULVA: normal appearing vulva with no masses, tenderness or lesions, VAGINA: normal appearing vagina with normal color and discharge, no lesions, CERVIX: normal appearing cervix without discharge or lesions, UTERUS: uterus is normal size, shape, consistency and nontender, ADNEXA: normal adnexa in size, nontender and no masses  Pelvic ultrasound Anteverted uterus 8.2 x 5.0 x 4.4 cm 1.5 cm anterior left fibroid Arcuate appearance of cavity Endometrial thickness 10.2 mm Right ovary normal in size, left ovary surgically absent No adnexal masses.  No free fluid. Sonohysterogram catheter is introduced under aseptic conditions, swabbing the cervix with Betadine swab x2.  The open sided speculum was used to place the catheter into the cervix and the speculum was withdrawn and the  sonographer placed the transvaginal probe.  10 mL of 0.9% normal saline was injected into the endometrial cavity revealing multiple filling defects consistent with endometrial polyps.  Chaperone: G. Whitley RDMS, present during the examination and procedure  ASSESSMENT:  54 y.o. G2P1011 here for sonohysterogram for abnormal uterine bleeding with finding of endometrial polyps, using HRT  PLAN:  I have recommended proceeding to hysteroscopy D&C to remove and evaluate the endometrial polyps.  The patient understands by using estrogen that there is a risk of overstimulation of the uterus and development of endometrial hyperplasia and/or cancer.  Very likely that these could just be benign polyps but we want to have them analyzed to be more certain.  She will continue HRT for now as we are commencing an endometrial work-up here shortly.  I discussed the same-day outpatient intent of the procedure, and risks of infection, bleeding, possible need for blood transfusion, perforation of uterus and/or cervix resulting in injury to surrounding organ structures including major pelvic blood vessels, bowel, bladder, and potentially ureter, and DVT.  Anesthesia will either be general or MAC depending on the anesthesiologist's choice, and inherent risks with being placed under anesthesia include myocardial infarction, stroke, rarely death.  Very rarely would laparoscopy and/or laparotomy be indicated to tend to any intra-abdominal hemorrhage and/or injury concern.  Postoperative recovery expectations of needing a few days away from employment duties in the absence of any complication were also discussed.   I will have staff reach out to the patient to schedule the procedure.  All questions were answered by the end of the visit.   Joseph Pierini MD 10/06/19

## 2019-10-11 ENCOUNTER — Telehealth: Payer: Self-pay

## 2019-10-11 NOTE — Telephone Encounter (Signed)
I called patient and discussed surgery benefits and estimated surgery prepymt due be one week before surgery to [place].  I will mail financial letter as well.  We discussed available dates and scheduled her for 11/18/19 at 8:30am at [facility].  I advised her regarding need for Covid testing on 11/15/19 and about quarantine protocol after testing.  Appt scheduled accordingly. I will mail her a handout and map for site.

## 2019-10-12 ENCOUNTER — Encounter: Payer: Self-pay | Admitting: Gynecology

## 2019-10-27 ENCOUNTER — Telehealth: Payer: Self-pay | Admitting: *Deleted

## 2019-10-27 NOTE — Telephone Encounter (Signed)
Patient scheduled for D&C on 11/18/19, called today stating she has started back spotting this am, and had bad cramps. Reports early this am she had cramps so back it woke her out of her sleep, took 3 tablets of ibuprofen. I explained that spotting maybe expected due to fibroid and polyps and I would continue taking the otc ibuprofen to help with the cramps. If bleeding should increase to a heavy flow to let us know, reports she has taken megace in past to help heavy bleeding. She is still taking estradiol 1mg  tablet and progesterone 100 mg capsule.  I told her I would relay this conversation to you as well.

## 2019-10-27 NOTE — Telephone Encounter (Signed)
If she is able to wean off of estradiol and progesterone I think that would be best at this time since she is having problems with irregular bleeding.  If she really cannot do that because of the hot flashes or other side effects, then she may continue for now. I do agree with your recommendations.

## 2019-10-31 ENCOUNTER — Telehealth: Payer: Self-pay

## 2019-10-31 ENCOUNTER — Ambulatory Visit (INDEPENDENT_AMBULATORY_CARE_PROVIDER_SITE_OTHER): Payer: No Typology Code available for payment source | Admitting: Obstetrics and Gynecology

## 2019-10-31 ENCOUNTER — Other Ambulatory Visit (HOSPITAL_COMMUNITY): Payer: No Typology Code available for payment source

## 2019-10-31 ENCOUNTER — Other Ambulatory Visit: Payer: Self-pay

## 2019-10-31 ENCOUNTER — Encounter: Payer: Self-pay | Admitting: Obstetrics and Gynecology

## 2019-10-31 VITALS — BP 130/80

## 2019-10-31 DIAGNOSIS — N939 Abnormal uterine and vaginal bleeding, unspecified: Secondary | ICD-10-CM | POA: Diagnosis not present

## 2019-10-31 DIAGNOSIS — N924 Excessive bleeding in the premenopausal period: Secondary | ICD-10-CM | POA: Diagnosis not present

## 2019-10-31 DIAGNOSIS — R102 Pelvic and perineal pain: Secondary | ICD-10-CM | POA: Diagnosis not present

## 2019-10-31 LAB — CBC
HCT: 39.3 % (ref 35.0–45.0)
Hemoglobin: 13 g/dL (ref 11.7–15.5)
MCH: 27.5 pg (ref 27.0–33.0)
MCHC: 33.1 g/dL (ref 32.0–36.0)
MCV: 83.3 fL (ref 80.0–100.0)
MPV: 9.6 fL (ref 7.5–12.5)
Platelets: 355 10*3/uL (ref 140–400)
RBC: 4.72 10*6/uL (ref 3.80–5.10)
RDW: 13.2 % (ref 11.0–15.0)
WBC: 11.9 10*3/uL — ABNORMAL HIGH (ref 3.8–10.8)

## 2019-10-31 MED ORDER — KETOROLAC TROMETHAMINE 60 MG/2ML IM SOLN
60.0000 mg | Freq: Once | INTRAMUSCULAR | Status: AC
  Administered 2019-10-31: 60 mg via INTRAMUSCULAR

## 2019-10-31 MED ORDER — TRAMADOL HCL 50 MG PO TABS: 50.0000 mg | ORAL_TABLET | Freq: Four times a day (QID) | ORAL | 0 refills | Status: DC | PRN

## 2019-10-31 NOTE — Progress Notes (Signed)
[name] [birth year] [id number]  SUBJECTIVE:  54 y.o. G74P1011 female presents for an acute visit due to ongoing bleeding and menstrual cramping.  Please see the previous note for details.  She is continuing on HRT.  She has had worsening of her vaginal bleeding over the weekend and did call in to the on-call service due to the intense cramping and bleeding and was started on a norethindrone taper.  The bleeding has slowed but not stopped and her cramping is getting unbearable.   Current Outpatient Medications  Medication Sig Dispense Refill  . aspirin EC 81 MG EC tablet Take 1 tablet (81 mg total) by mouth daily.    . diclofenac sodium (VOLTAREN) 1 % GEL APPLY 4 GRAMS TOPICALLY 4 (FOUR) TIMES DAILY. RIGHT POSTERIOR TIBIAL TENDON 900 g 0  . estradiol (ESTRACE) 1 MG tablet Take 1 tablet (1 mg total) by mouth daily. 90 tablet 4  . furosemide (LASIX) 20 MG tablet Take 1 tablet (20 mg total) by mouth 2 (two) times daily as needed (fluid). 30 tablet 3  . JANUVIA 100 MG tablet TAKE 1 TABLET BY MOUTH DAILY AFTER BREAKFAST. 90 tablet 1  . metFORMIN (GLUCOPHAGE) 500 MG tablet TAKE 1 TABLET BY MOUTH EVERY DAY WITH BREAKFAST 90 tablet 1  . norethindrone (AYGESTIN) 5 MG tablet Take 5 mg by mouth daily.    Marland Kitchen olmesartan-hydrochlorothiazide (BENICAR HCT) 40-12.5 MG tablet Take 1 tablet by mouth daily. 90 tablet 3  . Potassium 75 MG TABS Take 75 mg by mouth daily.     . progesterone (PROMETRIUM) 100 MG capsule Take 1 capsule (100 mg total) by mouth daily. 90 capsule 3  . QUEtiapine (SEROQUEL) 25 MG tablet Take 0.5 tablets (12.5 mg total) by mouth at bedtime. 45 tablet 1  . rizatriptan (MAXALT-MLT) 10 MG disintegrating tablet TAKE 1 TABLET BY MOUTH AS NEEDED FOR MIGRAINE. MAY REPEAT IN 2 HOURS IF NEEDED 10 tablet 6  . rosuvastatin (CRESTOR) 10 MG tablet Take 1 tablet (10 mg total) by mouth daily after breakfast. 90 tablet 3  . clonazePAM (KLONOPIN) 0.5 MG tablet Take 1 tablet (0.5 mg total) by  mouth 2 (two) times daily as needed for anxiety. (Patient not taking: Reported on 10/31/2019) 30 tablet 1  . diclofenac (VOLTAREN) 75 MG EC tablet Take 1 tablet (75 mg total) by mouth 2 (two) times daily. (Patient not taking: Reported on 10/31/2019) 60 tablet 1  . fluticasone (FLONASE) 50 MCG/ACT nasal spray Place 1 spray into both nostrils daily. (Patient not taking: Reported on 10/31/2019) 16 g 0  . loratadine (CLARITIN) 10 MG tablet Take 1 tablet (10 mg total) by mouth daily. (Patient not taking: Reported on 10/31/2019) 30 tablet 0  . nystatin-triamcinolone ointment (MYCOLOG) Apply 1 application topically 2 (two) times daily. (Patient not taking: Reported on 10/31/2019) 60 g 0  . promethazine (PHENERGAN) 25 MG tablet TAKE 1 TABLET EVERY 6 HOURS AS NEEDED FOR NAUSEA (Patient not taking: Reported on 10/31/2019)  1   No current facility-administered medications for this visit.   Allergies: Dilaudid [hydromorphone hcl], Sulfa antibiotics, Tylox [oxycodone-acetaminophen], and Clindamycin/lincomycin  Patient's last menstrual period was 03/04/2007.  Past medical history,surgical history, problem list, medications, allergies, family history and social history were all reviewed and documented as reviewed in the EPIC chart.  ROS: Pertinent positives as listed in HPI, remainder negative.  No urinary tract symptoms. GYN ROS: As described in HPI   OBJECTIVE:  BP 130/80   LMP 03/04/2007  The patient appears well,  alert, oriented x 3, in no distress. PELVIC EXAM: Deferred due to consultative nature of visit, no active vaginal hemorrhage   ASSESSMENT:  54 y.o. G2P1011 here for ongoing abnormal uterine bleeding and dysmenorrhea  PLAN:  We have already set up hysteroscopy D&C and endometrial polypectomy on 11/18/2019.  Due to her ongoing bleeding, however, and associated dysmenorrhea, we will move this case up as able.  We will check a CBC today. For the dysmenorrhea, I gave her 60 mg IM ketorolac today.  I  encouraged her to take ibuprofen 600 mg every 6 hours for up to 5 days with food.  Use Tylenol as needed.  I will give her #10 tablets of tramadol 50 mg to use for any worsening dysmenorrhea pain prn.  We will give her a work note for today off. We also discussed about long-term AUB control.  Insurance had an issue with covering a [name] since her Jasper was in the "menopausal range," but we can make the case for that and it could be inserted during time of the procedure.  Alternatively, I offered to perform endometrial ablation with NovaSure.  We discussed some potential issues with this, including that it may not work to control the bleeding long-term.  Also we do not know if she is harboring any hyperplasia or endometrial cancer causing this bleeding, so we would not know about that until after we have collected the [facility] specimen and done the ablation and then the pathology report would follow within days.  However, I have low suspicion of carcinoma based on her age and early menopausal/late perimenopausal status.  We discussed the ability of post endometrial ablation pain and recurrent dysmenorrhea, failure of ablation, and the abnormal pathology issue, any of which could necessitate moving to a hysterectomy.  If endometrial carcinoma, endometrial ablation may affect proper staging and outcomes and prognosis/treatment.  She understands all and would still like to proceed with the endometrial ablation at the same time as the hysteroscopy D&C.  I will have staff reach out to see if we can get her procedure moved up earlier and modified as noted.   Joseph Pierini MD 10/31/19

## 2019-10-31 NOTE — Telephone Encounter (Signed)
I received staff message from Dr. Delilah Shan: "Joseph Pierini, MD  Ramond Craver, [place] Patient is having more bleeding and cramping  Has hysteroscopy D&C with polypectomy/Myosure in Sept, I also want to add an endometrial ablation with NovaSure  Can we move the procedure up to this week by any chance since she is having more pain? "  I called patient and r/s her surgery to 11/04/19 at 1:00pm. Covid test was re-scheduled for tomorrow.  Novasure Ablation was added with OR scheduler.  I spoke with Sharrie Rothman about increased charge with the added surgery. She will speak with patient about surgery prepymt due.

## 2019-10-31 NOTE — Telephone Encounter (Signed)
Patient informed, reports she called on call MD Dr.Jertson over the weekend due to increased bleeding and was prescribed generic aygestin 5 mg tab 1 po three times daily for heavy bleeding, then wean to 1 tab bid , then daily until no bleeding. Patient said the bleeding is under better control, but has not stopped yet. However cramping pains continue, taking OTC ibuprofen and tylenol and not much relief, report cramping is 7 out of 10 pain. She asked if you would be willing to prescribed something to help with the painful cramping other than medication over the counter? Please advise

## 2019-10-31 NOTE — Telephone Encounter (Signed)
Patient called back c/o worsening pelvic cramping, was tearful on phone when speaking. Advised to schedule OV will be seen today as a work in for pelvic pain.

## 2019-11-01 ENCOUNTER — Telehealth: Payer: Self-pay

## 2019-11-01 ENCOUNTER — Other Ambulatory Visit (HOSPITAL_COMMUNITY)
Admission: RE | Admit: 2019-11-01 | Discharge: 2019-11-01 | Disposition: A | Discharge status: Home / Self Care | Payer: PRIVATE HEALTH INSURANCE | Source: Ambulatory Visit | Attending: Obstetrics and Gynecology | Admitting: Obstetrics and Gynecology

## 2019-11-01 DIAGNOSIS — Z20822 Contact with and (suspected) exposure to covid-19: Secondary | ICD-10-CM | POA: Insufficient documentation

## 2019-11-01 DIAGNOSIS — Z01812 Encounter for preprocedural laboratory examination: Secondary | ICD-10-CM | POA: Diagnosis not present

## 2019-11-01 LAB — SARS CORONAVIRUS 2 (TAT 6-24 HRS): SARS Coronavirus 2: NEGATIVE

## 2019-11-01 NOTE — Telephone Encounter (Signed)
Patient called stating her employer wants documentation that she had pre-op Covid test today and must quarantine until surgery on Friday.  Letter provided. Copy in chart.

## 2019-11-02 ENCOUNTER — Encounter (HOSPITAL_BASED_OUTPATIENT_CLINIC_OR_DEPARTMENT_OTHER): Payer: Self-pay | Admitting: Obstetrics and Gynecology

## 2019-11-02 ENCOUNTER — Other Ambulatory Visit: Payer: Self-pay

## 2019-11-02 NOTE — Progress Notes (Signed)
Spoke w/ via phone for pre-op interview---PT Lab needs dos---- urine preg, T & S, I stat 8, ekg              Lab results------none COVID test ------11-01-2019 at 845 Arrive at -------1100 am 11-04-2019 NPO after MN NO Solid Food.  Clear liquids from MN until---1000 am Medications to take morning of surgery -----rosuvastatin, estradiol, norethidrone, pregesterone, klonopin prn Diabetic medication -----none day of surgery Patient Special Instructions -----none Pre-Op special Istructions -----none Patient verbalized understanding of instructions that were given at this phone interview. Patient denies shortness of breath, chest pain, fever, cough at this phone interview.

## 2019-11-03 NOTE — Anesthesia Preprocedure Evaluation (Addendum)
Anesthesia Evaluation  Patient identified by MRN, date of birth, ID band Patient awake    Reviewed: Allergy & Precautions, NPO status , Patient's Chart, lab work & pertinent test results  Airway Mallampati: II  TM Distance: >3 FB Neck ROM: Full    Dental no notable dental hx. (+) Teeth Intact, Dental Advisory Given   Pulmonary neg pulmonary ROS,    Pulmonary exam normal breath sounds clear to auscultation       Cardiovascular hypertension, Pt. on medications Normal cardiovascular exam Rhythm:Regular Rate:Normal     Neuro/Psych  Headaches, negative psych ROS   GI/Hepatic negative GI ROS, Neg liver ROS,   Endo/Other  diabetes, Well Controlled, Type 2, Oral Hypoglycemic Agents  Renal/GU negative Renal ROS     Musculoskeletal  (+) Arthritis ,   Abdominal   Peds  Hematology negative hematology ROS (+)   Anesthesia Other Findings   Reproductive/Obstetrics                            Anesthesia Physical Anesthesia Plan  ASA: III  Anesthesia Plan: General   Post-op Pain Management:    Induction: Intravenous  PONV Risk Score and Plan: 4 or greater and Treatment may vary due to age or medical condition, Scopolamine patch - Pre-op, Midazolam, Dexamethasone and Ondansetron  Airway Management Planned: LMA  Additional Equipment: None  Intra-op Plan:   Post-operative Plan:   Informed Consent: I have reviewed the patients History and Physical, chart, labs and discussed the procedure including the risks, benefits and alternatives for the proposed anesthesia with the patient or authorized representative who has indicated his/her understanding and acceptance.     Dental advisory given  Plan Discussed with: CRNA and Anesthesiologist  Anesthesia Plan Comments:        Anesthesia Quick Evaluation

## 2019-11-04 ENCOUNTER — Ambulatory Visit (HOSPITAL_BASED_OUTPATIENT_CLINIC_OR_DEPARTMENT_OTHER): Payer: PRIVATE HEALTH INSURANCE | Admitting: Anesthesiology

## 2019-11-04 ENCOUNTER — Other Ambulatory Visit: Payer: Self-pay

## 2019-11-04 ENCOUNTER — Encounter (HOSPITAL_BASED_OUTPATIENT_CLINIC_OR_DEPARTMENT_OTHER): Payer: Self-pay | Admitting: Obstetrics and Gynecology

## 2019-11-04 ENCOUNTER — Encounter (HOSPITAL_BASED_OUTPATIENT_CLINIC_OR_DEPARTMENT_OTHER)
Admission: RE | Disposition: A | Discharge status: Home / Self Care | Payer: Self-pay | Source: Home / Self Care | Attending: Obstetrics and Gynecology

## 2019-11-04 ENCOUNTER — Ambulatory Visit (HOSPITAL_BASED_OUTPATIENT_CLINIC_OR_DEPARTMENT_OTHER)
Admission: RE | Admit: 2019-11-04 | Discharge: 2019-11-04 | Disposition: A | Discharge status: Home / Self Care | Payer: PRIVATE HEALTH INSURANCE | Attending: Obstetrics and Gynecology | Admitting: Obstetrics and Gynecology

## 2019-11-04 DIAGNOSIS — N840 Polyp of corpus uteri: Secondary | ICD-10-CM

## 2019-11-04 DIAGNOSIS — E119 Type 2 diabetes mellitus without complications: Secondary | ICD-10-CM | POA: Diagnosis not present

## 2019-11-04 DIAGNOSIS — G43909 Migraine, unspecified, not intractable, without status migrainosus: Secondary | ICD-10-CM | POA: Insufficient documentation

## 2019-11-04 DIAGNOSIS — N939 Abnormal uterine and vaginal bleeding, unspecified: Secondary | ICD-10-CM

## 2019-11-04 DIAGNOSIS — N921 Excessive and frequent menstruation with irregular cycle: Secondary | ICD-10-CM | POA: Diagnosis present

## 2019-11-04 DIAGNOSIS — Z6839 Body mass index (BMI) 39.0-39.9, adult: Secondary | ICD-10-CM | POA: Diagnosis not present

## 2019-11-04 DIAGNOSIS — Z791 Long term (current) use of non-steroidal anti-inflammatories (NSAID): Secondary | ICD-10-CM | POA: Diagnosis not present

## 2019-11-04 DIAGNOSIS — Z7982 Long term (current) use of aspirin: Secondary | ICD-10-CM | POA: Diagnosis not present

## 2019-11-04 DIAGNOSIS — Z79899 Other long term (current) drug therapy: Secondary | ICD-10-CM | POA: Insufficient documentation

## 2019-11-04 DIAGNOSIS — Z975 Presence of (intrauterine) contraceptive device: Secondary | ICD-10-CM | POA: Diagnosis not present

## 2019-11-04 DIAGNOSIS — Z79890 Hormone replacement therapy (postmenopausal): Secondary | ICD-10-CM | POA: Insufficient documentation

## 2019-11-04 DIAGNOSIS — I10 Essential (primary) hypertension: Secondary | ICD-10-CM | POA: Insufficient documentation

## 2019-11-04 DIAGNOSIS — M1990 Unspecified osteoarthritis, unspecified site: Secondary | ICD-10-CM | POA: Diagnosis not present

## 2019-11-04 DIAGNOSIS — E7800 Pure hypercholesterolemia, unspecified: Secondary | ICD-10-CM | POA: Insufficient documentation

## 2019-11-04 DIAGNOSIS — Z7984 Long term (current) use of oral hypoglycemic drugs: Secondary | ICD-10-CM | POA: Diagnosis not present

## 2019-11-04 HISTORY — DX: Abnormal uterine and vaginal bleeding, unspecified: N93.9

## 2019-11-04 HISTORY — PX: DILITATION & CURRETTAGE/HYSTROSCOPY WITH NOVASURE ABLATION: SHX5568

## 2019-11-04 LAB — POCT I-STAT, CHEM 8
BUN: 6 mg/dL (ref 6–20)
Calcium, Ion: 1.19 mmol/L (ref 1.15–1.40)
Chloride: 100 mmol/L (ref 98–111)
Creatinine, Ser: 0.7 mg/dL (ref 0.44–1.00)
Glucose, Bld: 113 mg/dL — ABNORMAL HIGH (ref 70–99)
HCT: 36 % (ref 36.0–46.0)
Hemoglobin: 12.2 g/dL (ref 12.0–15.0)
Potassium: 3.7 mmol/L (ref 3.5–5.1)
Sodium: 140 mmol/L (ref 135–145)
TCO2: 27 mmol/L (ref 22–32)

## 2019-11-04 LAB — TYPE AND SCREEN
ABO/RH(D): B POS
Antibody Screen: NEGATIVE

## 2019-11-04 LAB — ABO/RH: ABO/RH(D): B POS

## 2019-11-04 LAB — POCT PREGNANCY, URINE: Preg Test, Ur: NEGATIVE

## 2019-11-04 SURGERY — DILATATION & CURETTAGE/HYSTEROSCOPY WITH NOVASURE ABLATION
Anesthesia: General

## 2019-11-04 MED ORDER — ACETAMINOPHEN 325 MG RE SUPP: 650.0000 mg | RECTAL | Status: DC | PRN

## 2019-11-04 MED ORDER — DEXAMETHASONE SODIUM PHOSPHATE 10 MG/ML IJ SOLN
INTRAMUSCULAR | Status: DC | PRN
  Administered 2019-11-04: 5 mg via INTRAVENOUS

## 2019-11-04 MED ORDER — KETOROLAC TROMETHAMINE 30 MG/ML IJ SOLN
INTRAMUSCULAR | Status: DC | PRN
  Administered 2019-11-04: 30 mg via INTRAVENOUS

## 2019-11-04 MED ORDER — FENTANYL CITRATE (PF) 100 MCG/2ML IJ SOLN
25.0000 ug | INTRAMUSCULAR | Status: DC | PRN
  Administered 2019-11-04 (×3): 50 ug via INTRAVENOUS

## 2019-11-04 MED ORDER — LIDOCAINE 2% (20 MG/ML) 5 ML SYRINGE
INTRAMUSCULAR | Status: AC
  Filled 2019-11-04: qty 5

## 2019-11-04 MED ORDER — TRAMADOL HCL 50 MG PO TABS
ORAL_TABLET | ORAL | Status: AC
Start: 2019-11-04 — End: ?
  Filled 2019-11-04: qty 2

## 2019-11-04 MED ORDER — ACETAMINOPHEN 10 MG/ML IV SOLN
INTRAVENOUS | Status: AC
  Filled 2019-11-04: qty 100

## 2019-11-04 MED ORDER — HYDROMORPHONE HCL 2 MG PO TABS
ORAL_TABLET | ORAL | Status: AC
Start: 2019-11-04 — End: ?
  Filled 2019-11-04: qty 1

## 2019-11-04 MED ORDER — TRAMADOL HCL 50 MG PO TABS
50.0000 mg | ORAL_TABLET | Freq: Four times a day (QID) | ORAL | Status: DC | PRN
  Administered 2019-11-04: 100 mg via ORAL

## 2019-11-04 MED ORDER — ACETAMINOPHEN 10 MG/ML IV SOLN
1000.0000 mg | Freq: Once | INTRAVENOUS | Status: AC
  Administered 2019-11-04: 1000 mg via INTRAVENOUS

## 2019-11-04 MED ORDER — FENTANYL CITRATE (PF) 100 MCG/2ML IJ SOLN
INTRAMUSCULAR | Status: AC
  Filled 2019-11-04: qty 2

## 2019-11-04 MED ORDER — ONDANSETRON HCL 4 MG/2ML IJ SOLN
INTRAMUSCULAR | Status: DC | PRN
  Administered 2019-11-04: 4 mg via INTRAVENOUS

## 2019-11-04 MED ORDER — KETOROLAC TROMETHAMINE 30 MG/ML IJ SOLN: 30.0000 mg | Freq: Once | INTRAMUSCULAR | Status: DC | PRN

## 2019-11-04 MED ORDER — ASPIRIN 81 MG PO TBEC: 81.0000 mg | DELAYED_RELEASE_TABLET | ORAL | Status: DC

## 2019-11-04 MED ORDER — LIDOCAINE 2% (20 MG/ML) 5 ML SYRINGE
INTRAMUSCULAR | Status: DC | PRN
  Administered 2019-11-04: 80 mg via INTRAVENOUS

## 2019-11-04 MED ORDER — LACTATED RINGERS IV SOLN
INTRAVENOUS | Status: DC
  Administered 2019-11-04: 100 mL/h via INTRAVENOUS

## 2019-11-04 MED ORDER — ONDANSETRON HCL 4 MG/2ML IJ SOLN: 4.0000 mg | Freq: Once | INTRAMUSCULAR | Status: DC | PRN

## 2019-11-04 MED ORDER — BUPIVACAINE HCL (PF) 0.25 % IJ SOLN
INTRAMUSCULAR | Status: DC | PRN
  Administered 2019-11-04: 10 mL

## 2019-11-04 MED ORDER — ACETAMINOPHEN 325 MG PO TABS: 650.0000 mg | ORAL_TABLET | ORAL | Status: DC | PRN

## 2019-11-04 MED ORDER — PROPOFOL 10 MG/ML IV BOLUS
INTRAVENOUS | Status: AC
  Filled 2019-11-04: qty 20

## 2019-11-04 MED ORDER — AMISULPRIDE (ANTIEMETIC) 5 MG/2ML IV SOLN
INTRAVENOUS | Status: AC
  Filled 2019-11-04: qty 4

## 2019-11-04 MED ORDER — HYDROMORPHONE HCL 2 MG PO TABS
2.0000 mg | ORAL_TABLET | Freq: Once | ORAL | Status: AC
  Administered 2019-11-04: 2 mg via ORAL

## 2019-11-04 MED ORDER — AMISULPRIDE (ANTIEMETIC) 5 MG/2ML IV SOLN
10.0000 mg | Freq: Once | INTRAVENOUS | Status: AC | PRN
  Administered 2019-11-04: 10 mg via INTRAVENOUS

## 2019-11-04 MED ORDER — DEXMEDETOMIDINE (PRECEDEX) IN NS 20 MCG/5ML (4 MCG/ML) IV SYRINGE
PREFILLED_SYRINGE | INTRAVENOUS | Status: DC | PRN
  Administered 2019-11-04: 4 ug via INTRAVENOUS
  Administered 2019-11-04: 8 ug via INTRAVENOUS

## 2019-11-04 MED ORDER — SODIUM CHLORIDE 0.9 % IV SOLN: 250.0000 mL | INTRAVENOUS | Status: DC | PRN

## 2019-11-04 MED ORDER — LACTATED RINGERS IV SOLN
INTRAVENOUS | Status: DC
  Administered 2019-11-04: 1000 mL via INTRAVENOUS

## 2019-11-04 MED ORDER — PROPOFOL 10 MG/ML IV BOLUS
INTRAVENOUS | Status: DC | PRN
  Administered 2019-11-04: 120 mg via INTRAVENOUS

## 2019-11-04 MED ORDER — IBUPROFEN 200 MG PO CAPS
600.0000 mg | ORAL_CAPSULE | Freq: Three times a day (TID) | ORAL | 0 refills | Status: AC
Start: 2019-11-04 — End: 2019-11-09

## 2019-11-04 MED ORDER — SODIUM CHLORIDE 0.9 % IR SOLN
Status: DC | PRN
  Administered 2019-11-04: 3000 mL

## 2019-11-04 MED ORDER — MEPERIDINE HCL 25 MG/ML IJ SOLN: 6.2500 mg | INTRAMUSCULAR | Status: DC | PRN

## 2019-11-04 MED ORDER — FENTANYL CITRATE (PF) 100 MCG/2ML IJ SOLN
INTRAMUSCULAR | Status: DC | PRN
Start: 2019-11-04 — End: 2019-11-04
  Administered 2019-11-04: 25 ug via INTRAVENOUS
  Administered 2019-11-04: 50 ug via INTRAVENOUS
  Administered 2019-11-04: 25 ug via INTRAVENOUS

## 2019-11-04 MED ORDER — MIDAZOLAM HCL 5 MG/5ML IJ SOLN
INTRAMUSCULAR | Status: DC | PRN
  Administered 2019-11-04: 2 mg via INTRAVENOUS

## 2019-11-04 MED ORDER — LACTATED RINGERS IV SOLN: INTRAVENOUS | Status: DC

## 2019-11-04 MED ORDER — ACETAMINOPHEN 500 MG PO TABS: 1000.0000 mg | ORAL_TABLET | Freq: Four times a day (QID) | ORAL | Status: DC | PRN

## 2019-11-04 MED ORDER — SODIUM CHLORIDE 0.9% FLUSH: 3.0000 mL | Freq: Two times a day (BID) | INTRAVENOUS | Status: DC

## 2019-11-04 MED ORDER — MIDAZOLAM HCL 2 MG/2ML IJ SOLN
INTRAMUSCULAR | Status: AC
  Filled 2019-11-04: qty 2

## 2019-11-04 MED ORDER — SODIUM CHLORIDE 0.9% FLUSH: 3.0000 mL | INTRAVENOUS | Status: DC | PRN

## 2019-11-04 MED ORDER — KETOROLAC TROMETHAMINE 30 MG/ML IJ SOLN
INTRAMUSCULAR | Status: AC
  Filled 2019-11-04: qty 1

## 2019-11-04 SURGICAL SUPPLY — 17 items
ABLATOR SURESOUND NOVASURE (ABLATOR) ×2 IMPLANT
CATH ROBINSON RED A/P 16FR (CATHETERS) ×2 IMPLANT
COVER WAND RF STERILE (DRAPES) ×2 IMPLANT
DILATOR CANAL MILEX (MISCELLANEOUS) ×2 IMPLANT
GAUZE 4X4 16PLY RFD (DISPOSABLE) ×2 IMPLANT
GLOVE BIO SURGEON STRL SZ8 (GLOVE) ×2 IMPLANT
GLOVE BIOGEL PI IND STRL 8 (GLOVE) ×1 IMPLANT
GLOVE BIOGEL PI INDICATOR 8 (GLOVE) ×1
GOWN STRL REUS W/TWL XL LVL3 (GOWN DISPOSABLE) ×4 IMPLANT
IV NS IRRIG 3000ML ARTHROMATIC (IV SOLUTION) ×2 IMPLANT
KIT PROCEDURE FLUENT (KITS) ×2 IMPLANT
KIT TURNOVER CYSTO (KITS) ×2 IMPLANT
PACK VAGINAL MINOR WOMEN LF (CUSTOM PROCEDURE TRAY) ×2 IMPLANT
PAD OB MATERNITY 4.3X12.25 (PERSONAL CARE ITEMS) ×2 IMPLANT
PAD PREP 24X48 CUFFED NSTRL (MISCELLANEOUS) ×2 IMPLANT
SEAL ROD LENS SCOPE MYOSURE (ABLATOR) ×2 IMPLANT
TOWEL OR 17X26 10 PK STRL BLUE (TOWEL DISPOSABLE) ×2 IMPLANT

## 2019-11-04 NOTE — Progress Notes (Signed)
Patient  is still c/o lower abdominal cramping rating 8-9. Has ambulated to the BR and voided about 25cc. Bladder scanned x 4 for confirmation (25-38cc). Abdomen soft, minimal vaginal bleeding. Patient has had po narcotic and Ofirmev in phase 2 without significant relief. Dr Lanetta Inch aware. Oral dilaudid ordered for pain. Patient does not tolerate IV. VSS within patients pre-op parameters.

## 2019-11-04 NOTE — Addendum Note (Signed)
Addendum  created 11/04/19 1700 by Freddrick March, MD   Order list changed

## 2019-11-04 NOTE — Op Note (Signed)
Name: [name]  Age: 54 y.o.  Date of Birth: 1965/09/23  Medical Record #: [id number]  Operative Note  Preoperative Diagnosis: Menometrorrhagia/Abnormal Uterine Bleeding, Suspected Endometrial Polyp, HRT Use Procedure: Hysteroscopy, Dilatation and Curettage, Endometrial Polpectomy, Novasure Endometrial Ablation. Postoperative Diagnosis: same. Estimated Blood Loss:  5 mL Anesthesia: General LMA, local with 0.25% bupivacaine (10 mL) Specimens: Endometrial curettings, Endometrial polyp Findings: Normal proliferative appearing endometrial cavity with bilateral fallopian tube ostia visualized.  1 cm endometrial polyp attached at midline fundus.  Normal cavity contour, no evidence of fibroid. Endocervical canal normal. Uterus sounds to 9 cm with 3.5 cm cervical length, cavity width 3.1 cm.  Novasure ablation with power of 94 Watts, length of cycle 70 seconds. Hysteroscopic fluid deficit 150 ml.  Complication: none. Date: 11/04/19  DESCRIPTION OF PROCEDURE:    Preoperative review of the procedure was completed with the patient prior to transport to the operating room including risks, benefits, and alternatives. The patient's questions were answered and she agreed to proceed.  Under anesthesia, Disaya Walt was placed in the dorsolithotomy position with legs in [place] stirrups.  SCDs were applied to the lower extremities and were functioning.  A surgical team time out was performed to verify and agree on procedure and patient consent. A bimanual exam was performed.  The patient was prepped and draped in the usual sterile fashion.    Cervix was visualized with a weighted speculum in the posterior vagina and a Sims retractor anteriorly.  A paracervical block was applied in the standard fashion using 0.25% Marcaine. The anterior cervix was grasped with a single-toothed tenaculum. The uterine descensus was noted to be excellent.  Cervix was easily dilated gently using a progressive  series of Pratt dilators to size 15.  The uterine cavity was very gently sounded to establish a measurement of uterine cavity depth.  The Myosure hysteroscope was introduced and the uterine cavity and ostia were visualized.  Benign appearing thin endometrium with smooth polyp attached to fundus as noted above.  The cervix was gently dilated a second time to size 19.  Cervical length determined by the dilator meeting resistance at the internal os at 3.5 cm.  The Blake forceps were carefully introduced into the uterus to blindly grasp the polyp on the second attempt without concern for perforation.  The polyp was removed and sent to pathology for analysis.  A #3 curettage instrument was introduced.  A thorough curettage was productive of benign appearing endometrial tissue. The curettage was effective at achieving a uterine cry circumferentially. Bleeding was minimal at that point.  All tissue curettings were sent to pathology for review separately from the polyp.  The hysteroscope was briefly introduced again to demonstrate complete removal of the polyp.  The Novasure device was prepped for use using the measurements obtained above.  An ablation cycle was initiated after proper seating maneuvers and the cavity test passed.  There was no concern for perforation nor complications of the procedure.  Total time was 70 at 85 Watts.  The hysteroscope was introduced one final time and demonstrated good char of the endometrial lining except at the fundus.  There was no sign of perforation.  Instrumentation was removed from the vagina.  The tenaculum sites on the cervix were hemostatic.  Instrument, sponge, and needle counts were correct at the conclusion of the procedure. The patient tolerated the procedure well and was brought to recovery room in stable condition.   Joseph Pierini, M.D., Cherlynn June

## 2019-11-04 NOTE — OR Nursing (Signed)
   Novasure: 94 Watts 70 Seconds

## 2019-11-04 NOTE — H&P (Signed)
[name] 09-15-1965 MRN: [id number]  HPI The patient is a 54 y.o. G2P1011 who presents today for scheduled hysteroscopy D&C, polypectomy, Novasure endometrial ablation for abnormal uterine bleeding.  Still taking estrogen and progesterone for HRT due to night sweats.  No changes to her medical history since her pre op exam, denies CP, SOB, fever/chills, dysuria.  Past Medical History:  Diagnosis Date  . Abnormal uterine bleeding (AUB)   . Arthritis   . Diabetes mellitus   . Elevated cholesterol   . Endometrial polyp   . Endometrial polyp   . Endometriosis   . Hypertension   . Migraines   . Ovarian cyst     Past Surgical History:  Procedure Laterality Date  . COMBINED HYSTEROSCOPY DIAGNOSTIC / D&C  yrs ago  . DIAGNOSTIC LAPAROSCOPY  1993   with laser adhesions  . INTRAUTERINE DEVICE INSERTION     mirena-Inserted 05-16-14  . KNEE SURGERY Left yrs ago   meniscurs tear repair  . mirena removed  2021  . OOPHORECTOMY  2008   left  . ROTATOR CUFF REPAIR Right yrs ago  . trichomoniasis  01/2013  . WRIST SURGERY Right    gang. cyst    No current facility-administered medications on file prior to encounter.   Current Outpatient Medications on File Prior to Encounter  Medication Sig Dispense Refill  . cholecalciferol (VITAMIN D3) 25 MCG (1000 UNIT) tablet Take 1,000 Units by mouth daily.    Marland Kitchen estradiol (ESTRACE) 1 MG tablet Take 1 tablet (1 mg total) by mouth daily. 90 tablet 4  . furosemide (LASIX) 20 MG tablet Take 1 tablet (20 mg total) by mouth 2 (two) times daily as needed (fluid). 30 tablet 3  . JANUVIA 100 MG tablet TAKE 1 TABLET BY MOUTH DAILY AFTER BREAKFAST. 90 tablet 1  . metFORMIN (GLUCOPHAGE) 500 MG tablet TAKE 1 TABLET BY MOUTH EVERY DAY WITH BREAKFAST 90 tablet 1  . Multiple Vitamins-Minerals (MULTIVITAMIN ADULT) CHEW Chew by mouth.    . olmesartan-hydrochlorothiazide (BENICAR HCT) 40-12.5 MG tablet Take 1 tablet by mouth daily. 90 tablet 3  .  Potassium 75 MG TABS Take 75 mg by mouth daily.     . progesterone (PROMETRIUM) 100 MG capsule Take 1 capsule (100 mg total) by mouth daily. 90 capsule 3  . QUEtiapine (SEROQUEL) 25 MG tablet Take 0.5 tablets (12.5 mg total) by mouth at bedtime. 45 tablet 1  . rosuvastatin (CRESTOR) 10 MG tablet Take 1 tablet (10 mg total) by mouth daily after breakfast. 90 tablet 3  . zinc gluconate 50 MG tablet Take 50 mg by mouth daily.    Marland Kitchen aspirin EC 81 MG EC tablet Take 1 tablet (81 mg total) by mouth daily. (Patient taking differently: Take 81 mg by mouth every other day. )    . clonazePAM (KLONOPIN) 0.5 MG tablet Take 1 tablet (0.5 mg total) by mouth 2 (two) times daily as needed for anxiety. 30 tablet 1  . diclofenac (VOLTAREN) 75 MG EC tablet Take 1 tablet (75 mg total) by mouth 2 (two) times daily. (Patient not taking: Reported on 11/02/2019) 60 tablet 1  . diclofenac sodium (VOLTAREN) 1 % GEL APPLY 4 GRAMS TOPICALLY 4 (FOUR) TIMES DAILY. RIGHT POSTERIOR TIBIAL TENDON (Patient taking differently: To left elbow) 900 g 0  . promethazine (PHENERGAN) 25 MG tablet TAKE 1 TABLET EVERY 6 HOURS AS NEEDED FOR NAUSEA (Patient not taking: Reported on 10/31/2019)  1  . rizatriptan (MAXALT-MLT) 10 MG disintegrating tablet TAKE  1 TABLET BY MOUTH AS NEEDED FOR MIGRAINE. MAY REPEAT IN 2 HOURS IF NEEDED 10 tablet 6    Allergies  Allergen Reactions  . Dilaudid [Hydromorphone Hcl] Other (See Comments)    Broke in sweat and started shaking, can take oral  . Sulfa Antibiotics Hives  . Tylox [Oxycodone-Acetaminophen] Nausea And Vomiting  . Clindamycin/Lincomycin Rash    hives      Physical Exam   BP (!) 158/96   Temp 98.3 F (36.8 C) (Oral)   Resp 18   Ht 4' 10.5" (1.486 m)   Wt 88.5 kg   LMP 08/02/2007   SpO2 100%   BMI 40.08 kg/m    General: Pleasant female, no acute distress, alert and oriented CV: RRR, no murmurs Pulm: good respiratory effort, CTAB     Plan Proceed with  hysteroscopy D&C, polypectomy,  Novasure endometrial ablation as planned.  Discussed if visually concerning in endometrial cavity for carcinoma, may opt not to do Novasure.  Reviewed discussion about hyperplasia and carcinoma from the other day and she still wants to do the ablation part as discussed.  Discussed post op pain control with scheduled ibuprofen and Tylenol prn.  She has a few tramadol left at home from a previous rx for any stronger cramping pains.  All questions answered and the patient agrees to proceed.    Joseph Pierini, MD 11/04/19

## 2019-11-04 NOTE — Discharge Instructions (Signed)

## 2019-11-04 NOTE — Anesthesia Postprocedure Evaluation (Signed)
Anesthesia Post Note  Patient: [name]  Procedure(s) Performed: DILATATION & CURETTAGE/HYSTEROSCOPY WITH NOVASURE ABLATION (N/A )     Patient location during evaluation: PACU Anesthesia Type: General Level of consciousness: awake and alert Pain management: pain level controlled Vital Signs Assessment: post-procedure vital signs reviewed and stable Respiratory status: spontaneous breathing, nonlabored ventilation, respiratory function stable and patient connected to nasal cannula oxygen Cardiovascular status: blood pressure returned to baseline and stable Postop Assessment: no apparent nausea or vomiting Anesthetic complications: no   No complications documented.  Last Vitals:  Vitals:   11/04/19 1415 11/04/19 1423  BP: (!) 134/91   Pulse: 99 94  Resp: 19 12  Temp:    SpO2: 100% 96%    Last Pain:  Vitals:   11/04/19 1430  TempSrc:   PainSc: 8                  Barnet [place]

## 2019-11-04 NOTE — Anesthesia Procedure Notes (Signed)
Procedure Name: LMA Insertion Date/Time: 11/04/2019 1:05 PM Performed by: Gwyndolyn Saxon, CRNA Pre-anesthesia Checklist: Patient identified, Emergency Drugs available, Suction available and Patient being monitored Patient Re-evaluated:Patient Re-evaluated prior to induction Oxygen Delivery Method: Circle system utilized Preoxygenation: Pre-oxygenation with 100% oxygen Induction Type: IV induction Ventilation: Mask ventilation without difficulty LMA: LMA inserted LMA Size: 4.0 Number of attempts: 1 Airway Equipment and Method: Patient positioned with wedge pillow Placement Confirmation: positive ETCO2 and breath sounds checked- equal and bilateral Tube secured with: Tape Dental Injury: Teeth and Oropharynx as per pre-operative assessment

## 2019-11-04 NOTE — Transfer of Care (Signed)
Immediate Anesthesia Transfer of Care Note  Patient: [name]  Procedure(s) Performed: DILATATION & CURETTAGE/HYSTEROSCOPY WITH NOVASURE ABLATION (N/A )  Patient Location: PACU  Anesthesia Type:General  Level of Consciousness: drowsy  Airway & Oxygen Therapy: Patient Spontanous Breathing and Patient connected to face mask oxygen  Post-op Assessment: Report given to RN and Post -op Vital signs reviewed and stable  Post vital signs: Reviewed and stable  Last Vitals:  Vitals Value Taken Time  BP    Temp    Pulse    Resp 11 11/04/19 1345  SpO2    Vitals shown include unvalidated device data.  Last Pain:  Vitals:   11/04/19 1208  TempSrc: Oral  PainSc: 0-No pain      Patients Stated Pain Goal: 7 (19/62/22 9798)  Complications: No complications documented.

## 2019-11-08 LAB — SURGICAL PATHOLOGY

## 2019-11-09 ENCOUNTER — Encounter (HOSPITAL_BASED_OUTPATIENT_CLINIC_OR_DEPARTMENT_OTHER): Payer: Self-pay | Admitting: Obstetrics and Gynecology

## 2019-11-15 ENCOUNTER — Other Ambulatory Visit (HOSPITAL_COMMUNITY): Payer: No Typology Code available for payment source

## 2019-11-18 ENCOUNTER — Other Ambulatory Visit: Payer: Self-pay

## 2019-11-18 ENCOUNTER — Encounter: Payer: Self-pay | Admitting: Obstetrics and Gynecology

## 2019-11-18 ENCOUNTER — Ambulatory Visit (INDEPENDENT_AMBULATORY_CARE_PROVIDER_SITE_OTHER): Payer: No Typology Code available for payment source | Admitting: Obstetrics and Gynecology

## 2019-11-18 VITALS — BP 130/80

## 2019-11-18 DIAGNOSIS — Z9889 Other specified postprocedural states: Secondary | ICD-10-CM

## 2019-11-18 NOTE — Progress Notes (Addendum)
   [name] Jul 29, 1965 [id number]  SUBJECTIVE   REASON FOR APPOINTMENT Chief Complaint  Patient presents with  . Post Op check     HISTORY OF PRESENT ILLNESS [name]  presents for routine 2 week post-operative follow up after a hysteroscopy, dilation and curettage, polypectomy, NovaSure endometrial ablation performed on 11/04/2019 for abnormal uterine bleeding and menorrhagia with suspected polyp.  The patient is doing well with no concerns.  She has only occasional spotting, denies vaginal bleeding or discharge. She is tolerating normal diet.  Bowel and bladder function are normal.  OBJECTIVE  BP 130/80   LMP  (LMP Unknown)    PHYSICAL EXAM General:  Patient in no acute distress.   ASSESSMENT / PLAN  Routine 2 week post operative check.   The patient is doing well and meeting all postoperative milestones.  I reviewed the benign pathology report describing the endometrial tissue.  I showed her some pictures taken during the hysteroscopy.  She has continued on HRT, but is now only taking the estradiol 1 mg every other day and still takes the Prometrium 100 mg nightly which is great.  We reviewed the concept of endometrial stimulation with HRT.  We will see if we can get her weaned off of HRT in the next year or two.  She may return to normal activities without restriction and resume normal gynecologic care at this point.  Work release note provided.    Joseph Pierini MD 11/18/19

## 2019-11-29 ENCOUNTER — Other Ambulatory Visit: Payer: Self-pay | Admitting: Family Medicine

## 2019-11-29 DIAGNOSIS — Z1231 Encounter for screening mammogram for malignant neoplasm of breast: Secondary | ICD-10-CM

## 2020-01-02 ENCOUNTER — Encounter: Payer: Self-pay | Admitting: Physician Assistant

## 2020-01-02 ENCOUNTER — Ambulatory Visit (INDEPENDENT_AMBULATORY_CARE_PROVIDER_SITE_OTHER): Payer: No Typology Code available for payment source

## 2020-01-02 ENCOUNTER — Ambulatory Visit (INDEPENDENT_AMBULATORY_CARE_PROVIDER_SITE_OTHER): Payer: No Typology Code available for payment source | Admitting: Physician Assistant

## 2020-01-02 DIAGNOSIS — M25522 Pain in left elbow: Secondary | ICD-10-CM | POA: Diagnosis not present

## 2020-01-02 DIAGNOSIS — G8929 Other chronic pain: Secondary | ICD-10-CM | POA: Diagnosis not present

## 2020-01-02 DIAGNOSIS — M25512 Pain in left shoulder: Secondary | ICD-10-CM

## 2020-01-02 NOTE — Progress Notes (Addendum)
Office Visit Note   Patient: [name]           Date of Birth: 1965/12/02           MRN: [id number] Visit Date: 01/02/2020              Requested by: Isabel Moron, MD [phone] [place],  [place] [address] PCP: Isabel Moron, MD   Assessment & Plan: Visit Diagnoses:  1. Chronic left shoulder pain   2. Pain in left elbow     Plan: Recommend repeat cortisone injection left elbow .  However patient is just status post flu vaccine and is due to get her Covid booster soon.  Therefore recommend once it has been at least 2 weeks since her last vaccine that she come back for injection possibly for the left elbow or the left shoulder.  Did offer her formal physical therapy she defers.  Went overhead lifting techniques for the lateral epicondylitis also shoulder some stretching activities and some exercises she can perform at home.  Follow-Up Instructions: Return 2 weeks after vaccines.   Orders:  Orders Placed This Encounter  Procedures  . XR Shoulder Left   No orders of the defined types were placed in this encounter.     Procedures: No procedures performed   Clinical Data: No additional findings.   Subjective: Chief Complaint  Patient presents with  . Left Elbow - Pain, Follow-up  . Left Shoulder - Pain    HPI [name] comes in today for left elbow and shoulder pain.  She was last seen for her elbow in February 2021 and states that the elbow did well until last few months.  She is wanting a repeat injection.  She also is having pain in her left shoulder.  States she has been told she has bone-on-bone in the left shoulder.  She denies any numbness tingling down either arm.  7 pain with lifting in the objects including up with her left arm.  She is taking Tylenol and ibuprofen as needed for pain.  She does report that her glucose levels are under good control and she is diabetic. Review of Systems   Objective: Vital Signs:  There were no vitals taken for this visit.  Physical Exam Constitutional:      Appearance: She is not ill-appearing or diaphoretic.  Pulmonary:     Effort: Pulmonary effort is normal.  Neurological:     Mental Status: She is alert and oriented to person, place, and time.  Psychiatric:        Mood and Affect: Mood normal.     Ortho Exam Bilateral shoulders 5 and 5 strength external and internal rotation against resistance empty can test is negative bilaterally.  Positive crossover test on the left negative on the right.  Tenderness over the left AC joint.  Impingement testing on the left negative.  No significant crepitus with range of motion of the left shoulder left shoulder motion is fluid. Left elbow good range of motion.  Tenderness over the lateral epicondyle region provocative maneuvers cause pain lateral left elbow. Specialty Comments:  No specialty comments available.  Imaging: XR Shoulder Left  Result Date: 01/02/2020 Left shoulder 3 views: Shoulders well located.  Glenohumeral joint appears well-maintained.  Severe AC joint arthritic changes.  Otherwise no acute fractures or bony abnormalities.    PMFS History: Patient Active Problem List   Diagnosis Date Noted  . Class 2 severe  obesity due to excess calories with serious comorbidity and body mass index (BMI) of 39.0 to 39.9 in adult [facility]) 03/09/2019  . Acute right-sided low back pain with right-sided sciatica 06/10/2017  . Trochanteric bursitis, right hip 05/27/2017  . Pain in right hip 05/27/2017  . Contusion, thigh and hip, left, sequela 01/08/2017  . Low back pain 01/08/2017  . IUD (intrauterine device) in place 05/16/2014  . Atypical chest pain 11/18/2012  . Injection site extravasation of IV contrast 11/18/2012  . DM (diabetes mellitus), type 2 ([place]) 11/18/2012  . Benign hypertension 11/18/2012  . Hypokalemia 11/18/2012  . Menopause 09/16/2011  . Elevated cholesterol    Past Medical History:  Diagnosis  Date  . Abnormal uterine bleeding (AUB)   . Arthritis   . Diabetes mellitus   . Elevated cholesterol   . Endometrial polyp   . Endometrial polyp   . Endometriosis   . Hypertension   . Migraines   . Ovarian cyst     Family History  Problem Relation Age of Onset  . Hypertension Mother   . Diabetes Mother   . COPD Mother   . Cancer Mother        Lung  . Hypertension Father   . Diabetes Sister   . Hypertension Brother   . Diabetes Maternal Grandfather   . Colon cancer Neg Hx   . Esophageal cancer Neg Hx   . Rectal cancer Neg Hx   . Stomach cancer Neg Hx     Past Surgical History:  Procedure Laterality Date  . COMBINED HYSTEROSCOPY DIAGNOSTIC / D&C  yrs ago  . DIAGNOSTIC LAPAROSCOPY  1993   with laser adhesions  . DILITATION & CURRETTAGE/HYSTROSCOPY WITH NOVASURE ABLATION N/A 11/04/2019   Procedure: DILATATION & CURETTAGE/HYSTEROSCOPY WITH NOVASURE ABLATION;  Surgeon: Joseph Pierini, MD;  Location: [facility];  Service: Gynecology;  Laterality: N/A;  . INTRAUTERINE DEVICE INSERTION     mirena-Inserted 05-16-14  . KNEE SURGERY Left yrs ago   meniscurs tear repair  . mirena removed  2021  . OOPHORECTOMY  2008   left  . ROTATOR CUFF REPAIR Right yrs ago  . trichomoniasis  01/2013  . WRIST SURGERY Right    gang. cyst   Social History   Occupational History  . Not on file  Tobacco Use  . Smoking status: Never Smoker  . Smokeless tobacco: Never Used  Vaping Use  . Vaping Use: Never used  Substance and Sexual Activity  . Alcohol use: Yes    Alcohol/week: 0.0 standard drinks    Comment: rare  . Drug use: No  . Sexual activity: Not Currently

## 2020-01-06 ENCOUNTER — Other Ambulatory Visit: Payer: Self-pay

## 2020-01-06 ENCOUNTER — Ambulatory Visit
Admission: RE | Admit: 2020-01-06 | Discharge: 2020-01-06 | Disposition: A | Discharge status: Home / Self Care | Payer: No Typology Code available for payment source | Source: Ambulatory Visit | Attending: Family Medicine | Admitting: Family Medicine

## 2020-01-06 DIAGNOSIS — Z1231 Encounter for screening mammogram for malignant neoplasm of breast: Secondary | ICD-10-CM

## 2020-01-13 ENCOUNTER — Ambulatory Visit: Payer: No Typology Code available for payment source | Attending: Internal Medicine

## 2020-01-13 DIAGNOSIS — Z23 Encounter for immunization: Secondary | ICD-10-CM

## 2020-01-13 NOTE — Progress Notes (Signed)
   Covid-19 Vaccination Clinic  Name:  [name]    MRN: [id number] DOB: 17-May-1965  01/13/2020  [name] was observed post Covid-19 immunization for 15 minutes without incident. She was provided with Vaccine Information Sheet and instruction to access the V-Safe system.   [name] was instructed to call 911 with any severe reactions post vaccine: Marland Kitchen Difficulty breathing  . Swelling of face and throat  . A fast heartbeat  . A bad rash all over body  . Dizziness and weakness

## 2020-01-29 ENCOUNTER — Other Ambulatory Visit: Payer: Self-pay | Admitting: Obstetrics and Gynecology

## 2020-02-06 ENCOUNTER — Emergency Department (HOSPITAL_COMMUNITY): Payer: PRIVATE HEALTH INSURANCE

## 2020-02-06 ENCOUNTER — Other Ambulatory Visit: Payer: Self-pay

## 2020-02-06 ENCOUNTER — Encounter (HOSPITAL_COMMUNITY): Payer: Self-pay | Admitting: Emergency Medicine

## 2020-02-06 ENCOUNTER — Emergency Department (HOSPITAL_COMMUNITY)
Admission: EM | Admit: 2020-02-06 | Discharge: 2020-02-07 | Disposition: A | Discharge status: Home / Self Care | Payer: PRIVATE HEALTH INSURANCE | Attending: Emergency Medicine | Admitting: Emergency Medicine

## 2020-02-06 DIAGNOSIS — I10 Essential (primary) hypertension: Secondary | ICD-10-CM | POA: Insufficient documentation

## 2020-02-06 DIAGNOSIS — Z79899 Other long term (current) drug therapy: Secondary | ICD-10-CM | POA: Diagnosis not present

## 2020-02-06 DIAGNOSIS — R0789 Other chest pain: Secondary | ICD-10-CM

## 2020-02-06 DIAGNOSIS — E119 Type 2 diabetes mellitus without complications: Secondary | ICD-10-CM | POA: Insufficient documentation

## 2020-02-06 DIAGNOSIS — Z7984 Long term (current) use of oral hypoglycemic drugs: Secondary | ICD-10-CM | POA: Insufficient documentation

## 2020-02-06 DIAGNOSIS — R079 Chest pain, unspecified: Secondary | ICD-10-CM | POA: Insufficient documentation

## 2020-02-06 DIAGNOSIS — Z7982 Long term (current) use of aspirin: Secondary | ICD-10-CM | POA: Diagnosis not present

## 2020-02-06 LAB — CBC
HCT: 39.1 % (ref 36.0–46.0)
Hemoglobin: 12.9 g/dL (ref 12.0–15.0)
MCH: 27.3 pg (ref 26.0–34.0)
MCHC: 33 g/dL (ref 30.0–36.0)
MCV: 82.8 fL (ref 80.0–100.0)
Platelets: 307 10*3/uL (ref 150–400)
RBC: 4.72 MIL/uL (ref 3.87–5.11)
RDW: 13.1 % (ref 11.5–15.5)
WBC: 10.5 10*3/uL (ref 4.0–10.5)
nRBC: 0 % (ref 0.0–0.2)

## 2020-02-06 LAB — BASIC METABOLIC PANEL
Anion gap: 13 (ref 5–15)
BUN: 15 mg/dL (ref 6–20)
CO2: 29 mmol/L (ref 22–32)
Calcium: 9.5 mg/dL (ref 8.9–10.3)
Chloride: 96 mmol/L — ABNORMAL LOW (ref 98–111)
Creatinine, Ser: 0.78 mg/dL (ref 0.44–1.00)
GFR, Estimated: >60 mL/min (ref >60)
Glucose, Bld: 123 mg/dL — ABNORMAL HIGH (ref 70–99)
Potassium: 3.3 mmol/L — ABNORMAL LOW (ref 3.5–5.1)
Sodium: 138 mmol/L (ref 135–145)

## 2020-02-06 LAB — I-STAT BETA HCG BLOOD, ED (MC, WL, AP ONLY): I-stat hCG, quantitative: <5 m[IU]/mL (ref <5)

## 2020-02-06 LAB — TROPONIN I (HIGH SENSITIVITY)
Troponin I (High Sensitivity): <2 ng/L (ref <18)
Troponin I (High Sensitivity): 2 ng/L (ref <18)

## 2020-02-06 NOTE — ED Triage Notes (Signed)
Pt brought to ED by [facility] from work for c/o left cp radiating to back. SR on EKG. BP 176/90, HR 80, R 20, SPO2 98% RA.

## 2020-02-07 NOTE — Discharge Instructions (Addendum)
Take ibuprofen 600 mg every 6 hours as needed for pain.  Follow-up with cardiology in the next few days.  Their contact information has been provided in this discharge summary for you to call and make these arrangements.  Return to the emergency department in the meantime if you develop severe pain, difficulty breathing, high fever, or other new and concerning symptoms.

## 2020-02-07 NOTE — ED Provider Notes (Signed)
[facility] EMERGENCY DEPARTMENT Provider Note   CSN: [id number] Arrival date & time: 02/06/20  1646     History Chief Complaint  Patient presents with  . Chest Pain    [name] is a 54 y.o. female.  Patient is a 54 year old female with history of diabetes, hypertension, hyperlipidemia, obesity.  She presents today for evaluation of chest discomfort.  She describes to me a 44-month history of constant pain to the left side of her chest that radiates into her neck and down her arm.  This is worse when she moves certain ways, but otherwise there is no exertional component.  She denies shortness of breath, nausea, diaphoresis.  She has no prior cardiac history, but does report having had a stress test/echocardiogram several years ago.  He tells me these were unremarkable.  She also admits to increased stress related to the loss of her mother in the past year.  The history is provided by the patient.  Chest Pain Pain location:  L chest Pain quality: aching   Pain radiates to:  L arm and neck Pain severity:  Mild Onset quality:  Gradual Duration:  2 months Timing:  Intermittent Progression:  Worsening Chronicity:  New Relieved by:  Nothing Worsened by:  Movement and certain positions      Past Medical History:  Diagnosis Date  . Abnormal uterine bleeding (AUB)   . Arthritis   . Diabetes mellitus   . Elevated cholesterol   . Endometrial polyp   . Endometrial polyp   . Endometriosis   . Hypertension   . Migraines   . Ovarian cyst     Patient Active Problem List   Diagnosis Date Noted  . Class 2 severe obesity due to excess calories with serious comorbidity and body mass index (BMI) of 39.0 to 39.9 in adult ([place]) 03/09/2019  . Acute right-sided low back pain with right-sided sciatica 06/10/2017  . Trochanteric bursitis, right hip 05/27/2017  . Pain in right hip 05/27/2017  . Contusion, thigh and hip, left, sequela 01/08/2017  . Low back  pain 01/08/2017  . IUD (intrauterine device) in place 05/16/2014  . Atypical chest pain 11/18/2012  . Injection site extravasation of IV contrast 11/18/2012  . DM (diabetes mellitus), type 2 ([place]) 11/18/2012  . Benign hypertension 11/18/2012  . Hypokalemia 11/18/2012  . Menopause 09/16/2011  . Elevated cholesterol     Past Surgical History:  Procedure Laterality Date  . COMBINED HYSTEROSCOPY DIAGNOSTIC / D&C  yrs ago  . DIAGNOSTIC LAPAROSCOPY  1993   with laser adhesions  . DILITATION & CURRETTAGE/HYSTROSCOPY WITH NOVASURE ABLATION N/A 11/04/2019   Procedure: DILATATION & CURETTAGE/HYSTEROSCOPY WITH NOVASURE ABLATION;  Surgeon: Joseph Pierini, MD;  Location: [facility];  Service: Gynecology;  Laterality: N/A;  . INTRAUTERINE DEVICE INSERTION     mirena-Inserted 05-16-14  . KNEE SURGERY Left yrs ago   meniscurs tear repair  . mirena removed  2021  . OOPHORECTOMY  2008   left  . ROTATOR CUFF REPAIR Right yrs ago  . trichomoniasis  01/2013  . WRIST SURGERY Right    gang. cyst     OB History    Gravida  2   Para  1   Term  1   Preterm      AB  1   Living  1     SAB      TAB      Ectopic      Multiple  Live Births              Family History  Problem Relation Age of Onset  . Hypertension Mother   . Diabetes Mother   . COPD Mother   . Cancer Mother        Lung  . Hypertension Father   . Diabetes Sister   . Hypertension Brother   . Diabetes Maternal Grandfather   . Colon cancer Neg Hx   . Esophageal cancer Neg Hx   . Rectal cancer Neg Hx   . Stomach cancer Neg Hx     Social History   Tobacco Use  . Smoking status: Never Smoker  . Smokeless tobacco: Never Used  Vaping Use  . Vaping Use: Never used  Substance Use Topics  . Alcohol use: Yes    Alcohol/week: 0.0 standard drinks    Comment: rare  . Drug use: No    Home Medications Prior to Admission medications   Medication Sig Start Date End Date Taking? Authorizing  Provider  acetaminophen (TYLENOL) 500 MG tablet Take 2 tablets (1,000 mg total) by mouth every 6 (six) hours as needed for mild pain. 11/04/19   Joseph Pierini, MD  aspirin 81 MG EC tablet Take 1 tablet (81 mg total) by mouth every other day. 11/09/19   Joseph Pierini, MD  cholecalciferol (VITAMIN D3) 25 MCG (1000 UNIT) tablet Take 1,000 Units by mouth daily.    [provider]  clonazePAM (KLONOPIN) 0.5 MG tablet Take 1 tablet (0.5 mg total) by mouth 2 (two) times daily as needed for anxiety. 03/09/19   Forrest Moron, MD  diclofenac sodium (VOLTAREN) 1 % GEL APPLY 4 GRAMS TOPICALLY 4 (FOUR) TIMES DAILY. RIGHT POSTERIOR TIBIAL TENDON Patient taking differently: To left elbow 12/01/17   Mcarthur Rossetti, MD  estradiol (ESTRACE) 1 MG tablet Take 1 tablet (1 mg total) by mouth daily. Patient taking differently: Take 1 mg by mouth every other day.  12/10/18   Fontaine, Belinda Block, MD  furosemide (LASIX) 20 MG tablet Take 1 tablet (20 mg total) by mouth 2 (two) times daily as needed (fluid). 11/10/17   Stallings, Arlie Solomons, MD  JANUVIA 100 MG tablet TAKE 1 TABLET BY MOUTH DAILY AFTER BREAKFAST. 03/09/19   Delia Chimes A, MD  metFORMIN (GLUCOPHAGE) 500 MG tablet TAKE 1 TABLET BY MOUTH EVERY DAY WITH BREAKFAST 03/09/19   Forrest Moron, MD  Multiple Vitamins-Minerals (MULTIVITAMIN ADULT) CHEW Chew by mouth.    [provider]  olmesartan-hydrochlorothiazide (BENICAR HCT) 40-12.5 MG tablet Take 1 tablet by mouth daily. 12/22/18   Forrest Moron, MD  Potassium 75 MG TABS Take 75 mg by mouth daily.     [provider]  progesterone (PROMETRIUM) 100 MG capsule Take 1 capsule (100 mg total) by mouth daily. 05/27/19   Joseph Pierini, MD  promethazine (PHENERGAN) 25 MG tablet TAKE 1 TABLET EVERY 6 HOURS AS NEEDED FOR NAUSEA Patient not taking: Reported on 10/31/2019 12/16/16   [provider]  QUEtiapine (SEROQUEL) 25 MG tablet Take 0.5 tablets (12.5 mg total) by mouth at  bedtime. 03/09/19   Forrest Moron, MD  rizatriptan (MAXALT-MLT) 10 MG disintegrating tablet TAKE 1 TABLET BY MOUTH AS NEEDED FOR MIGRAINE. MAY REPEAT IN 2 HOURS IF NEEDED 12/22/18   Delia Chimes A, MD  rosuvastatin (CRESTOR) 10 MG tablet Take 1 tablet (10 mg total) by mouth daily after breakfast. 12/22/18   Forrest Moron, MD  traMADol (ULTRAM) 50 MG tablet  Take 1-2 tablets (50-100 mg total) by mouth every 6 (six) hours as needed for severe pain. Patient not taking: Reported on 11/18/2019 10/31/19   Joseph Pierini, MD  zinc gluconate 50 MG tablet Take 50 mg by mouth daily.    [provider]    Allergies    Dilaudid [hydromorphone hcl], Sulfa antibiotics, Tylox [oxycodone-acetaminophen], and Clindamycin/lincomycin  Review of Systems   Review of Systems  Cardiovascular: Positive for chest pain.  All other systems reviewed and are negative.   Physical Exam Updated Vital Signs BP 139/85 (BP Location: Right Arm)   Pulse (!) 106   Temp 98.4 F (36.9 C) (Oral)   Resp (!) 21   Ht 4\' 10"  (1.473 m)   Wt 88.5 kg   SpO2 100%   BMI 40.78 kg/m   Physical Exam Vitals and nursing note reviewed.  Constitutional:      General: She is not in acute distress.    Appearance: She is well-developed. She is not diaphoretic.  HENT:     Head: Normocephalic and atraumatic.  Cardiovascular:     Rate and Rhythm: Normal rate and regular rhythm.     Heart sounds: No murmur heard.  No friction rub. No gallop.   Pulmonary:     Effort: Pulmonary effort is normal. No respiratory distress.     Breath sounds: Normal breath sounds. No wheezing.  Abdominal:     General: Bowel sounds are normal. There is no distension.     Palpations: Abdomen is soft.     Tenderness: There is no abdominal tenderness.  Musculoskeletal:        General: Normal range of motion.     Cervical back: Normal range of motion and neck supple.     Right lower leg: No tenderness. No edema.     Left lower leg: No  tenderness. No edema.  Skin:    General: Skin is warm and dry.  Neurological:     Mental Status: She is alert and oriented to person, place, and time.     ED Results / Procedures / Treatments   Labs (all labs ordered are listed, but only abnormal results are displayed) Labs Reviewed  BASIC METABOLIC PANEL - Abnormal; Notable for the following components:      Result Value   Potassium 3.3 (*)    Chloride 96 (*)    Glucose, Bld 123 (*)    All other components within normal limits  CBC  I-STAT BETA HCG BLOOD, ED ([facility], [facility], [facility] ONLY)  TROPONIN I (HIGH SENSITIVITY)  TROPONIN I (HIGH SENSITIVITY)    EKG EKG Interpretation  Date/Time:  Monday February 06 2020 16:52:16 EST Ventricular Rate:  102 PR Interval:  166 QRS Duration: 88 QT Interval:  344 QTC Calculation: 448 R Axis:   -74 Text Interpretation: Sinus tachycardia Left axis deviation Possible Lateral infarct , age undetermined Abnormal ECG No significant change since 11/04/2019 Confirmed by Veryl Speak [phone]) on 02/07/2020 9:47:18 AM   Radiology DG Chest 2 View  Result Date: 02/06/2020 CLINICAL DATA:  Intermittent chest pain. t since last year after the passing of her mother but it will not go away. EXAM: CHEST - 2 VIEW COMPARISON:  Chest x-ray 11/18/2012, CT chest 11/18/2012. FINDINGS: The heart size and mediastinal contours are within normal limits. No focal consolidation. No pulmonary edema. No pleural effusion. No pneumothorax. No acute osseous abnormality. Multilevel degenerative changes of the spine. IMPRESSION: No active cardiopulmonary disease. Electronically Signed   By: Iven Finn  M.D.   On: 02/06/2020 17:41    Procedures Procedures (including critical care time)  Medications Ordered in ED Medications - No data to display  ED Course  I have reviewed the triage vital signs and the nursing notes.  Pertinent labs & imaging results that were available during my care of the patient were reviewed by me and  considered in my medical decision making (see chart for details).    MDM Rules/Calculators/A&P  Patient presenting with complaints of chest discomfort as described in the HPI.  Her work-up today is unremarkable including unchanged EKG and negative troponin x2.  Chest x-ray is clear.  As her symptoms have been ongoing for 2 months with negative work-up, I feel as though discharge with outpatient follow-up is appropriate.  Patient will be given a referral to cardiology to discuss possible stress test or other diagnostics.  Patient is to return as needed if her symptoms worsen or change in the meantime.  Final Clinical Impression(s) / ED Diagnoses Final diagnoses:  None    Rx / DC Orders ED Discharge Orders    None       Veryl Speak, MD 02/07/20 1004

## 2020-02-13 ENCOUNTER — Ambulatory Visit (INDEPENDENT_AMBULATORY_CARE_PROVIDER_SITE_OTHER): Payer: 59 | Admitting: Physician Assistant

## 2020-02-13 ENCOUNTER — Encounter: Payer: Self-pay | Admitting: Physician Assistant

## 2020-02-13 VITALS — Ht <= 58 in | Wt 195.0 lb

## 2020-02-13 DIAGNOSIS — M7712 Lateral epicondylitis, left elbow: Secondary | ICD-10-CM

## 2020-02-13 MED ORDER — METHYLPREDNISOLONE ACETATE 40 MG/ML IJ SUSP
40.0000 mg | INTRAMUSCULAR | Status: AC | PRN
  Administered 2020-02-13: 09:00:00 40 mg

## 2020-02-13 MED ORDER — LIDOCAINE HCL 1 % IJ SOLN
1.0000 mL | INTRAMUSCULAR | Status: AC | PRN
  Administered 2020-02-13: 09:00:00 1 mL

## 2020-02-13 NOTE — Progress Notes (Signed)
   Procedure Note  Patient: [name]             Date of Birth: Aug 12, 1965           MRN: [id number]             Visit Date: 02/13/2020 HPI: Ms. Streed returns today for left elbow injection.  She was last seen in November and at that time had had a flu vaccine and is due did have her Covid booster and therefore we held up on injection of the left elbow.  She continues to have pain lateral aspect of the left elbow.  She has started physical therapy for shoulder and elbow at the recommendation of her primary care physician.  She is taking ibuprofen.  Physical exam: Left elbow tenderness over the lateral epicondyle region.  Otherwise good range of motion the elbow without pain.  Procedures: Visit Diagnoses:  1. Lateral epicondylitis, left elbow     Hand/UE Inj for lateral epicondylitis on 02/13/2020 9:00 AM Medications: 1 mL lidocaine 1 %; 40 mg methylPREDNISolone acetate 40 MG/ML Consent was given by the patient. Immediately prior to procedure a time out was called to verify the correct patient, procedure, equipment, support staff and site/side marked as required. Patient was prepped and draped in the usual sterile fashion.     Plan: We will see how she does in regards to the injection along with the therapy for her left elbow and shoulder.  She will follow-up as needed.  Discussed lifting techniques with her.  Also stretching techniques discussed for the elbow.  No charge for today's office visit is injection was done last time due to her vaccines that were being given her had just been given.

## 2020-02-14 ENCOUNTER — Other Ambulatory Visit: Payer: Self-pay

## 2020-02-14 MED ORDER — ESTRADIOL 1 MG PO TABS
1.0000 mg | ORAL_TABLET | Freq: Every day | ORAL | 4 refills | Status: DC
Start: 2020-02-14 — End: 2020-08-29

## 2020-02-14 NOTE — Telephone Encounter (Signed)
Patient was due in October for Annual exam. Therefore, her request for refill on Estradiol was denied, per protocol, with a note to call the office to schedule CE so that we could refill.  She called today stating that you told her she would not need an annual exam this year after she had D&C, Hyst.  She said you told her she could wait until next year.  Just confirming this and to see if you want to refill without annual appointment.

## 2020-02-19 NOTE — Progress Notes (Deleted)
Cardiology Office Note:   Date:  02/19/2020  NAME:  [name]    MRN: [id number] DOB:  12-Jan-1966   PCP:  Forrest Moron, MD  Cardiologist:  No primary care provider on file.  Electrophysiologist:  None   Referring MD: Forrest Moron, MD   No chief complaint on file. ***  History of Present Illness:   [name] is a 54 y.o. female with a hx of DM, HTN, obesity who is being seen today for the evaluation of chest pain at the request of chest pain. Seen in the ER 02/07/2020 for chest pain. Troponin negative.   Problem List 1. Diabetes -A1c 7.5 -T chol 124, HDL 58, LDL 49, TG 92 2. HTN 3. Obesity -BMI 41  Past Medical History: Past Medical History:  Diagnosis Date  . Abnormal uterine bleeding (AUB)   . Arthritis   . Diabetes mellitus   . Elevated cholesterol   . Endometrial polyp   . Endometrial polyp   . Endometriosis   . Hypertension   . Migraines   . Ovarian cyst     Past Surgical History: Past Surgical History:  Procedure Laterality Date  . COMBINED HYSTEROSCOPY DIAGNOSTIC / D&C  yrs ago  . DIAGNOSTIC LAPAROSCOPY  1993   with laser adhesions  . DILITATION & CURRETTAGE/HYSTROSCOPY WITH NOVASURE ABLATION N/A 11/04/2019   Procedure: DILATATION & CURETTAGE/HYSTEROSCOPY WITH NOVASURE ABLATION;  Surgeon: Joseph Pierini, MD;  Location: [facility];  Service: Gynecology;  Laterality: N/A;  . INTRAUTERINE DEVICE INSERTION     mirena-Inserted 05-16-14  . KNEE SURGERY Left yrs ago   meniscurs tear repair  . mirena removed  2021  . OOPHORECTOMY  2008   left  . ROTATOR CUFF REPAIR Right yrs ago  . trichomoniasis  01/2013  . WRIST SURGERY Right    gang. cyst    Current Medications: No outpatient medications have been marked as taking for the 02/21/20 encounter (Appointment) with O'Neal, Cassie Freer, MD.     Allergies:    Dilaudid [hydromorphone hcl], Sulfa antibiotics, Tylox [oxycodone-acetaminophen], and  Clindamycin/lincomycin   Social History: Social History   Socioeconomic History  . Marital status: Married    Spouse name: Not on file  . Number of children: Not on file  . Years of education: Not on file  . Highest education level: Not on file  Occupational History  . Not on file  Tobacco Use  . Smoking status: Never Smoker  . Smokeless tobacco: Never Used  Vaping Use  . Vaping Use: Never used  Substance and Sexual Activity  . Alcohol use: Yes    Alcohol/week: 0.0 standard drinks    Comment: rare  . Drug use: No  . Sexual activity: Not Currently  Other Topics Concern  . Not on file  Social History Narrative  . Not on file   Social Determinants of Health   Financial Resource Strain: Not on file  Food Insecurity: Not on file  Transportation Needs: Not on file  Physical Activity: Not on file  Stress: Not on file  Social Connections: Not on file     Family History: The patient's ***family history includes COPD in her mother; Cancer in her mother; Diabetes in her maternal grandfather, mother, and sister; Hypertension in her brother, father, and mother. There is no history of Colon cancer, Esophageal cancer, Rectal cancer, or Stomach cancer.  ROS:   All other ROS reviewed and negative. Pertinent positives noted in the HPI.  EKGs/Labs/Other Studies Reviewed:   The following studies were personally reviewed by me today:  EKG:  EKG is *** ordered today.  The ekg ordered today demonstrates ***, and was personally reviewed by me.   Recent Labs: 06/08/2019: ALT 6 02/06/2020: BUN 15; Creatinine, Ser 0.78; Hemoglobin 12.9; Platelets 307; Potassium 3.3; Sodium 138   Recent Lipid Panel    Component Value Date/Time   CHOL 124 12/22/2018 0944   TRIG 92 12/22/2018 0944   HDL 58 12/22/2018 0944   CHOLHDL 2.1 12/22/2018 0944   LDLCALC 49 12/22/2018 0944    Physical Exam:   VS:  There were no vitals taken for this visit.   Wt Readings from Last 3 Encounters:  02/13/20  195 lb (88.5 kg)  02/06/20 195 lb 1.7 oz (88.5 kg)  11/04/19 195 lb 1.6 oz (88.5 kg)    General: Well nourished, well developed, in no acute distress Head: Atraumatic, normal size  Eyes: PEERLA, EOMI  Neck: Supple, no JVD Endocrine: No thryomegaly Cardiac: Normal S1, S2; RRR; no murmurs, rubs, or gallops Lungs: Clear to auscultation bilaterally, no wheezing, rhonchi or rales  Abd: Soft, nontender, no hepatomegaly  Ext: No edema, pulses 2+ Musculoskeletal: No deformities, BUE and BLE strength normal and equal Skin: Warm and dry, no rashes   Neuro: Alert and oriented to person, place, time, and situation, CNII-XII grossly intact, no focal deficits  Psych: Normal mood and affect   ASSESSMENT:   [name] is a 54 y.o. female who presents for the following: No diagnosis found.  PLAN:   There are no diagnoses linked to this encounter.  Disposition: No follow-ups on file.  Medication Adjustments/Labs and Tests Ordered: Current medicines are reviewed at length with the patient today.  Concerns regarding medicines are outlined above.  No orders of the defined types were placed in this encounter.  No orders of the defined types were placed in this encounter.   There are no Patient Instructions on file for this visit.   Time Spent with Patient: I have spent a total of *** minutes with patient reviewing hospital notes, telemetry, EKGs, labs and examining the patient as well as establishing an assessment and plan that was discussed with the patient.  > 50% of time was spent in direct patient care.  Signed, Addison Naegeli. Audie Box, [place]  [address] [place], [place] [address] [phone]  02/19/2020 4:49 PM

## 2020-02-21 ENCOUNTER — Ambulatory Visit: Payer: No Typology Code available for payment source | Admitting: Cardiovascular Disease

## 2020-03-11 NOTE — Progress Notes (Signed)
Cardiology Office Note:   Date:  03/12/2020  NAME:  [name]    MRN: [id number] DOB:  1965-04-19   PCP:  Forrest Moron, MD  Cardiologist:  No primary care provider on file.   Referring MD: Forrest Moron, MD   Chief Complaint  Patient presents with  . Chest Pain   History of Present Illness:   [name] is a 55 y.o. female with a hx of DM, HTN, HLD who is being seen today for the evaluation of chest pain at the request of Forrest Moron, MD. Seen in the ER 12/7 for chest pain. Negative troponin and EKG without ischemic changes. Sent for evaluation. She reports her mother passed of lung cancer in October 2020.  Since that time she has had issues with stress and anxiety.  She reports she can get tightness in her chest that occurs when she thinks about a stressful situation or stress is up on her.  She is having difficulties getting over the loss of her mother.  She reports that the chest tightness resolves with anxiety medicine and/or relaxing.  She does not get the chest tightness when she exerts herself or does any exercise.  She is not exercising routinely now.  Symptoms appear to be strictly related to anxiety.  In the past she has been evaluated in the emergency room and was recently evaluated for similar symptoms.  EKG showed nonspecific ST-T changes likely blood pressure related.  CVD risk factors include hypertension and diabetes.  Her most recent LDL cholesterol is 49.  Diabetes A1c is 7.5.  Her blood pressure in office today is 140/83.  She is obese with a BMI of 39.  She does not smoke or drink alcohol.  No drug use reported.  She does work in Facilities manager.  Overall, she reports symptoms are strictly related to stress and anxiety.  Problem List 1. HTN 2. DM -A1c 7.5 3. HLD -T chol 124, HDL 58, LDL 49, TG 92 4. Obesity -BMI 39  Past Medical History: Past Medical History:  Diagnosis Date  . Abnormal uterine bleeding (AUB)   . Arthritis   .  Diabetes mellitus   . Elevated cholesterol   . Endometrial polyp   . Endometrial polyp   . Endometriosis   . Hypertension   . Migraines   . Ovarian cyst     Past Surgical History: Past Surgical History:  Procedure Laterality Date  . COMBINED HYSTEROSCOPY DIAGNOSTIC / D&C  yrs ago  . DIAGNOSTIC LAPAROSCOPY  1993   with laser adhesions  . DILITATION & CURRETTAGE/HYSTROSCOPY WITH NOVASURE ABLATION N/A 11/04/2019   Procedure: DILATATION & CURETTAGE/HYSTEROSCOPY WITH NOVASURE ABLATION;  Surgeon: Joseph Pierini, MD;  Location: [facility];  Service: Gynecology;  Laterality: N/A;  . INTRAUTERINE DEVICE INSERTION     mirena-Inserted 05-16-14  . KNEE SURGERY Left yrs ago   meniscurs tear repair  . mirena removed  2021  . OOPHORECTOMY  2008   left  . ROTATOR CUFF REPAIR Right yrs ago  . trichomoniasis  01/2013  . WRIST SURGERY Right    gang. cyst    Current Medications: Current Meds  Medication Sig  . acetaminophen (TYLENOL) 500 MG tablet Take 2 tablets (1,000 mg total) by mouth every 6 (six) hours as needed for mild pain.  Marland Kitchen aspirin 81 MG EC tablet Take 1 tablet (81 mg total) by mouth every other day.  . cholecalciferol (VITAMIN D3) 25 MCG (1000 UNIT) tablet Take  1,000 Units by mouth daily.  . clonazePAM (KLONOPIN) 0.5 MG tablet Take 1 tablet (0.5 mg total) by mouth 2 (two) times daily as needed for anxiety.  . diclofenac sodium (VOLTAREN) 1 % GEL APPLY 4 GRAMS TOPICALLY 4 (FOUR) TIMES DAILY. RIGHT POSTERIOR TIBIAL TENDON (Patient taking differently: To left elbow)  . estradiol (ESTRACE) 1 MG tablet Take 1 tablet (1 mg total) by mouth daily.  . furosemide (LASIX) 20 MG tablet Take 1 tablet (20 mg total) by mouth 2 (two) times daily as needed (fluid).  Marland Kitchen JANUVIA 100 MG tablet TAKE 1 TABLET BY MOUTH DAILY AFTER BREAKFAST.  . metFORMIN (GLUCOPHAGE) 500 MG tablet TAKE 1 TABLET BY MOUTH EVERY DAY WITH BREAKFAST  . Multiple Vitamins-Minerals (MULTIVITAMIN ADULT) CHEW Chew by  mouth.  . olmesartan-hydrochlorothiazide (BENICAR HCT) 40-12.5 MG tablet Take 1 tablet by mouth daily.  . Potassium 75 MG TABS Take 75 mg by mouth daily.   . progesterone (PROMETRIUM) 100 MG capsule Take 1 capsule (100 mg total) by mouth daily.  . promethazine (PHENERGAN) 25 MG tablet TAKE 1 TABLET EVERY 6 HOURS AS NEEDED FOR NAUSEA  . QUEtiapine (SEROQUEL) 25 MG tablet Take 0.5 tablets (12.5 mg total) by mouth at bedtime.  . rizatriptan (MAXALT-MLT) 10 MG disintegrating tablet TAKE 1 TABLET BY MOUTH AS NEEDED FOR MIGRAINE. MAY REPEAT IN 2 HOURS IF NEEDED  . rosuvastatin (CRESTOR) 10 MG tablet Take 1 tablet (10 mg total) by mouth daily after breakfast.  . traMADol (ULTRAM) 50 MG tablet Take 1-2 tablets (50-100 mg total) by mouth every 6 (six) hours as needed for severe pain.  Marland Kitchen zinc gluconate 50 MG tablet Take 50 mg by mouth daily.     Allergies:    Dilaudid [hydromorphone hcl], Sulfa antibiotics, Tylox [oxycodone-acetaminophen], and Clindamycin/lincomycin   Social History: Social History   Socioeconomic History  . Marital status: Married    Spouse name: Not on file  . Number of children: Not on file  . Years of education: Not on file  . Highest education level: Not on file  Occupational History  . Not on file  Tobacco Use  . Smoking status: Never Smoker  . Smokeless tobacco: Never Used  Vaping Use  . Vaping Use: Never used  Substance and Sexual Activity  . Alcohol use: Yes    Alcohol/week: 0.0 standard drinks    Comment: rare  . Drug use: No  . Sexual activity: Not Currently  Other Topics Concern  . Not on file  Social History Narrative  . Not on file   Social Determinants of Health   Financial Resource Strain: Not on file  Food Insecurity: Not on file  Transportation Needs: Not on file  Physical Activity: Not on file  Stress: Not on file  Social Connections: Not on file    Family History: The patient's family history includes COPD in her mother; Cancer in her  mother; Diabetes in her maternal grandfather, mother, and sister; Hypertension in her brother, father, and mother. There is no history of Colon cancer, Esophageal cancer, Rectal cancer, or Stomach cancer.  ROS:   All other ROS reviewed and negative. Pertinent positives noted in the HPI.     EKGs/Labs/Other Studies Reviewed:   The following studies were personally reviewed by me today:  EKG:  EKG is ordered today.  The ekg ordered today demonstrates sinus tachycardia, heart rate 101, nonspecific ST-T changes noted, and was personally reviewed by me.   Recent Labs: 06/08/2019: ALT 6 02/06/2020: BUN 15; Creatinine,  Ser 0.78; Hemoglobin 12.9; Platelets 307; Potassium 3.3; Sodium 138   Recent Lipid Panel    Component Value Date/Time   CHOL 124 12/22/2018 0944   TRIG 92 12/22/2018 0944   HDL 58 12/22/2018 0944   CHOLHDL 2.1 12/22/2018 0944   LDLCALC 49 12/22/2018 0944    Physical Exam:   VS:  There were no vitals taken for this visit.   Wt Readings from Last 3 Encounters:  02/13/20 195 lb (88.5 kg)  02/06/20 195 lb 1.7 oz (88.5 kg)  11/04/19 195 lb 1.6 oz (88.5 kg)    General: Well nourished, well developed, in no acute distress Head: Atraumatic, normal size  Eyes: PEERLA, EOMI  Neck: Supple, no JVD Endocrine: No thryomegaly Cardiac: Normal S1, S2; RRR; no murmurs, rubs, or gallops Lungs: Clear to auscultation bilaterally, no wheezing, rhonchi or rales  Abd: Soft, nontender, no hepatomegaly  Ext: No edema, pulses 2+ Musculoskeletal: No deformities, BUE and BLE strength normal and equal Skin: Warm and dry, no rashes   Neuro: Alert and oriented to person, place, time, and situation, CNII-XII grossly intact, no focal deficits  Psych: Normal mood and affect   ASSESSMENT:   Michaele Amundson is a 55 y.o. female who presents for the following: 1. Chest pain, unspecified type   2. Mixed hyperlipidemia   3. Primary hypertension     PLAN:   1. Chest pain, unspecified  type -Atypical chest pain.  Symptoms strictly occur with anxiety and stress.  They are alleviated by anxiety medicine and/or relaxation.  She has no symptoms concerning for typical angina.  Her EKG today shows sinus tachycardia with nonspecific ST-T changes.  She had an echocardiogram in 2014 that was normal.  I want her to repeat this.  She has no evidence of heart failure on examination.  I have a low suspicion for underlying effective CAD.  I do not recommend a stress test.  Should she have symptoms that occur with exertion or other symptoms that are not related to stress we could reevaluate her.  For now she seems to be doing well and has not had any further symptoms in the past few weeks. -I have encouraged her to seek appropriate treatment of her anxiety as well as to exercise regularly. -Her EKG does show nonspecific ST-T changes which are either obesity or blood pressure related.  I would like for her to repeat an echocardiogram just to ensure there is no change.  2. Mixed hyperlipidemia -Most recent LDL cholesterol 49.  She is at goal for her diabetes.  She should stop aspirin.  No strong indication for this.  3. Primary hypertension -BP well controlled.  4. Obesity (BMI 30-39.9) -Diet and exercise recommended.  Disposition: No follow-ups on file.  Medication Adjustments/Labs and Tests Ordered: Current medicines are reviewed at length with the patient today.  Concerns regarding medicines are outlined above.  No orders of the defined types were placed in this encounter.  No orders of the defined types were placed in this encounter.   There are no Patient Instructions on file for this visit.   Signed, Addison Naegeli. Audie Box, [place]  [address] [place], [place] [address] [phone]  03/12/2020 8:46 AM

## 2020-03-12 ENCOUNTER — Other Ambulatory Visit: Payer: Self-pay

## 2020-03-12 ENCOUNTER — Encounter: Payer: Self-pay | Admitting: Cardiovascular Disease

## 2020-03-12 ENCOUNTER — Ambulatory Visit (INDEPENDENT_AMBULATORY_CARE_PROVIDER_SITE_OTHER): Payer: 59 | Admitting: Cardiovascular Disease

## 2020-03-12 VITALS — BP 140/83 | HR 101 | Temp 97.3°F | Ht 59.0 in | Wt 192.8 lb

## 2020-03-12 DIAGNOSIS — I10 Essential (primary) hypertension: Secondary | ICD-10-CM | POA: Diagnosis not present

## 2020-03-12 DIAGNOSIS — E782 Mixed hyperlipidemia: Secondary | ICD-10-CM

## 2020-03-12 DIAGNOSIS — R079 Chest pain, unspecified: Secondary | ICD-10-CM

## 2020-03-12 DIAGNOSIS — E669 Obesity, unspecified: Secondary | ICD-10-CM | POA: Diagnosis not present

## 2020-03-12 NOTE — Patient Instructions (Signed)
Medication Instructions:  STOP aspirin *If you need a refill on your cardiac medications before your next appointment, please call your pharmacy*  Testing/Procedures: Your physician has requested that you have an echocardiogram. Echocardiography is a painless test that uses sound waves to create images of your heart. It provides your doctor with information about the size and shape of your heart and how well your heart's chambers and valves are working. This procedure takes approximately one hour. There are no restrictions for this procedure. -- [address] - [address] Floor   Follow-Up: At [facility], you and your health needs are our priority.  As part of our continuing mission to provide you with exceptional heart care, we have created designated Provider Care Teams.  These Care Teams include your primary Cardiologist (physician) and Advanced Practice Providers (APPs -  Physician Assistants and Nurse Practitioners) who all work together to provide you with the care you need, when you need it.  We recommend signing up for the patient portal called "MyChart".  Sign up information is provided on this After Visit Summary.  MyChart is used to connect with patients for Virtual Visits (Telemedicine).  Patients are able to view lab/test results, encounter notes, upcoming appointments, etc.  Non-urgent messages can be sent to your provider as well.   To learn more about what you can do with MyChart, go to [email or web address].    Your next appointment:   AS NEEDED  Provider:   You may see Dr. Audie Box or one of the following Advanced Practice Providers on your designated Care Team:    Almyra Deforest, PA-C  Fabian Sharp, [place] or   Roby Lofts, [place]    Other Instructions

## 2020-03-22 ENCOUNTER — Other Ambulatory Visit: Payer: Self-pay | Admitting: Obstetrics and Gynecology

## 2020-03-23 NOTE — Telephone Encounter (Signed)
Dr.Kendall approved estradiol 1 mg on 02/14/20 with refill. Progesterone approved as well.

## 2020-03-27 ENCOUNTER — Ambulatory Visit (HOSPITAL_COMMUNITY): Payer: 59 | Attending: Cardiology

## 2020-03-27 ENCOUNTER — Other Ambulatory Visit: Payer: Self-pay

## 2020-03-27 DIAGNOSIS — I10 Essential (primary) hypertension: Secondary | ICD-10-CM | POA: Insufficient documentation

## 2020-03-27 DIAGNOSIS — E782 Mixed hyperlipidemia: Secondary | ICD-10-CM | POA: Diagnosis not present

## 2020-03-27 DIAGNOSIS — E669 Obesity, unspecified: Secondary | ICD-10-CM | POA: Insufficient documentation

## 2020-03-27 DIAGNOSIS — R079 Chest pain, unspecified: Secondary | ICD-10-CM | POA: Insufficient documentation

## 2020-03-27 LAB — ECHOCARDIOGRAM COMPLETE
Area-P 1/2: 3.65 cm2
S' Lateral: 2.3 cm

## 2020-04-24 ENCOUNTER — Ambulatory Visit
Admission: EM | Admit: 2020-04-24 | Discharge: 2020-04-24 | Disposition: A | Discharge status: Home / Self Care | Payer: 59 | Attending: Family Medicine | Admitting: Family Medicine

## 2020-04-24 ENCOUNTER — Encounter: Payer: Self-pay | Admitting: Emergency Medicine

## 2020-04-24 ENCOUNTER — Other Ambulatory Visit: Payer: Self-pay

## 2020-04-24 DIAGNOSIS — M5412 Radiculopathy, cervical region: Secondary | ICD-10-CM | POA: Diagnosis not present

## 2020-04-24 MED ORDER — TIZANIDINE HCL 4 MG PO TABS: 4.0000 mg | ORAL_TABLET | Freq: Every day | ORAL | 0 refills | Status: DC

## 2020-04-24 MED ORDER — PREDNISONE 20 MG PO TABS: 20.0000 mg | ORAL_TABLET | Freq: Every day | ORAL | 0 refills | Status: AC

## 2020-04-24 NOTE — ED Provider Notes (Signed)
[name]    CSN: [id number] Arrival date & time: 04/24/20  1913      History   Chief Complaint Chief Complaint  Patient presents with  . Back Pain    HPI [name] is a 55 y.o. female.   HPI  Patient presents for evaluation of back, shoulder, and neck pain x 2 days. History of arthritis. PCP prescribed physical therapy as this problem has happened previously. Physical therapy sessions were expensive therefore she stopped.  She works at a desk for most of the day and attributes this activity to pain. She has taken a few doses of ibuprofen without releif . She has full ROM of shoulder, neck and back with pain. Characterizes pain as a persistent aching. Past Medical History:  Diagnosis Date  . Abnormal uterine bleeding (AUB)   . Arthritis   . Diabetes mellitus   . Elevated cholesterol   . Endometrial polyp   . Endometrial polyp   . Endometriosis   . Hypertension   . Migraines   . Ovarian cyst     Patient Active Problem List   Diagnosis Date Noted  . Class 2 severe obesity due to excess calories with serious comorbidity and body mass index (BMI) of 39.0 to 39.9 in adult ([place]) 03/09/2019  . Acute right-sided low back pain with right-sided sciatica 06/10/2017  . Trochanteric bursitis, right hip 05/27/2017  . Pain in right hip 05/27/2017  . Contusion, thigh and hip, left, sequela 01/08/2017  . Low back pain 01/08/2017  . IUD (intrauterine device) in place 05/16/2014  . Atypical chest pain 11/18/2012  . Injection site extravasation of IV contrast 11/18/2012  . DM (diabetes mellitus), type 2 ([place]) 11/18/2012  . Benign hypertension 11/18/2012  . Hypokalemia 11/18/2012  . Menopause 09/16/2011  . Elevated cholesterol     Past Surgical History:  Procedure Laterality Date  . COMBINED HYSTEROSCOPY DIAGNOSTIC / D&C  yrs ago  . DIAGNOSTIC LAPAROSCOPY  1993   with laser adhesions  . DILITATION & CURRETTAGE/HYSTROSCOPY WITH NOVASURE ABLATION N/A  11/04/2019   Procedure: DILATATION & CURETTAGE/HYSTEROSCOPY WITH NOVASURE ABLATION;  Surgeon: Joseph Pierini, MD;  Location: [place];  Service: Gynecology;  Laterality: N/A;  . INTRAUTERINE DEVICE INSERTION     mirena-Inserted 05-16-14  . KNEE SURGERY Left yrs ago   meniscurs tear repair  . mirena removed  2021  . OOPHORECTOMY  2008   left  . ROTATOR CUFF REPAIR Right yrs ago  . trichomoniasis  01/2013  . WRIST SURGERY Right    gang. cyst    OB History    Gravida  2   Para  1   Term  1   Preterm      AB  1   Living  1     SAB      IAB      Ectopic      Multiple      Live Births               Home Medications    Prior to Admission medications   Medication Sig Start Date End Date Taking? Authorizing Provider  acetaminophen (TYLENOL) 500 MG tablet Take 2 tablets (1,000 mg total) by mouth every 6 (six) hours as needed for mild pain. 11/04/19   Joseph Pierini, MD  cholecalciferol (VITAMIN D3) 25 MCG (1000 UNIT) tablet Take 1,000 Units by mouth daily.    [provider]  clonazePAM (KLONOPIN) 0.5 MG tablet Take 1 tablet (  0.5 mg total) by mouth 2 (two) times daily as needed for anxiety. 03/09/19   Forrest Moron, MD  diclofenac sodium (VOLTAREN) 1 % GEL APPLY 4 GRAMS TOPICALLY 4 (FOUR) TIMES DAILY. RIGHT POSTERIOR TIBIAL TENDON Patient taking differently: To left elbow 12/01/17   Mcarthur Rossetti, MD  estradiol (ESTRACE) 1 MG tablet Take 1 tablet (1 mg total) by mouth daily. 02/14/20   Joseph Pierini, MD  furosemide (LASIX) 20 MG tablet Take 1 tablet (20 mg total) by mouth 2 (two) times daily as needed (fluid). 11/10/17   Stallings, Arlie Solomons, MD  JANUVIA 100 MG tablet TAKE 1 TABLET BY MOUTH DAILY AFTER BREAKFAST. 03/09/19   Delia Chimes A, MD  metFORMIN (GLUCOPHAGE) 500 MG tablet TAKE 1 TABLET BY MOUTH EVERY DAY WITH BREAKFAST 03/09/19   Forrest Moron, MD  Multiple Vitamins-Minerals (MULTIVITAMIN ADULT) CHEW Chew by mouth.     [provider]  olmesartan-hydrochlorothiazide (BENICAR HCT) 40-12.5 MG tablet Take 1 tablet by mouth daily. 12/22/18   Forrest Moron, MD  Potassium 75 MG TABS Take 75 mg by mouth daily.     [provider]  progesterone (PROMETRIUM) 100 MG capsule TAKE 1 CAPSULE BY MOUTH EVERY DAY 03/23/20   Joseph Pierini, MD  promethazine (PHENERGAN) 25 MG tablet TAKE 1 TABLET EVERY 6 HOURS AS NEEDED FOR NAUSEA 12/16/16   [provider]  QUEtiapine (SEROQUEL) 25 MG tablet Take 0.5 tablets (12.5 mg total) by mouth at bedtime. 03/09/19   Forrest Moron, MD  rizatriptan (MAXALT-MLT) 10 MG disintegrating tablet TAKE 1 TABLET BY MOUTH AS NEEDED FOR MIGRAINE. MAY REPEAT IN 2 HOURS IF NEEDED 12/22/18   Delia Chimes A, MD  rosuvastatin (CRESTOR) 10 MG tablet Take 1 tablet (10 mg total) by mouth daily after breakfast. 12/22/18   Forrest Moron, MD  traMADol (ULTRAM) 50 MG tablet Take 1-2 tablets (50-100 mg total) by mouth every 6 (six) hours as needed for severe pain. 10/31/19   Joseph Pierini, MD  zinc gluconate 50 MG tablet Take 50 mg by mouth daily.    [provider]    Family History Family History  Problem Relation Age of Onset  . Hypertension Mother   . Diabetes Mother   . COPD Mother   . Cancer Mother        Lung  . Hypertension Father   . Stroke Father   . Diabetes Sister   . Hypertension Brother   . Diabetes Maternal Grandfather   . Colon cancer Neg Hx   . Esophageal cancer Neg Hx   . Rectal cancer Neg Hx   . Stomach cancer Neg Hx     Social History Social History   Tobacco Use  . Smoking status: Never Smoker  . Smokeless tobacco: Never Used  Vaping Use  . Vaping Use: Never used  Substance Use Topics  . Alcohol use: Yes    Alcohol/week: 0.0 standard drinks    Comment: rare  . Drug use: No     Allergies   Dilaudid [hydromorphone hcl], Sulfa antibiotics, Tylox [oxycodone-acetaminophen], and Clindamycin/lincomycin   Review of  Systems Review of Systems Pertinent negatives listed in HPI  Physical Exam Triage Vital Signs ED Triage Vitals  Enc Vitals Group     BP 04/24/20 2035 132/86     Pulse Rate 04/24/20 2035 (!) 112     Resp 04/24/20 2035 18     Temp 04/24/20 2035 98.1 F (36.7 C)     Temp Source  04/24/20 2035 Oral     SpO2 04/24/20 2035 95 %     Weight --      Height --      Head Circumference --      Peak Flow --      Pain Score 04/24/20 2038 9     Pain Loc --      Pain Edu? --      Excl. in [place]? --    No data found.  Updated Vital Signs BP 132/86 (BP Location: Right Arm)   Pulse (!) 112   Temp 98.1 F (36.7 C) (Oral)   Resp 18   SpO2 95%   Visual Acuity Right Eye Distance:   Left Eye Distance:   Bilateral Distance:    Right Eye Near:   Left Eye Near:    Bilateral Near:     Physical Exam Constitutional: Patient appears well-developed and well-nourished. No distress. HENT: Normocephalic, atraumatic, External right and left ear normal. Neck: Normal alignment,  Neck supple. No JVD. No tracheal deviation. No thyromegaly. CVS: RRR, S1/S2 +, no murmurs, no gallops, no carotid bruit.  Pulmonary: Effort and breath sounds normal, no stridor, rhonchi, wheezes, rales.  Abdominal: Soft. BS +, no distension, tenderness, rebound or guarding.  Musculoskeletal: No gross deformity shoulder, back, and neck. 5/5 strength  Neuro: Alert. Normal reflexes, muscle tone coordination. No cranial nerve deficit. Skin: Skin is warm and dry. No rash noted. Not diaphoretic. No erythema. No pallor. Psychiatric: Normal mood and affect. Behavior, judgment, thought content normal. UC Treatments / Results  Labs (all labs ordered are listed, but only abnormal results are displayed) Labs Reviewed - No data to display  EKG   Radiology No results found.  Procedures Procedures (including critical care time)  Medications Ordered in UC Medications - No data to display  Initial Impression / Assessment and Plan  / UC Course  I have reviewed the triage vital signs and the nursing notes.  Pertinent labs & imaging results that were available during my care of the patient were reviewed by me and considered in my medical decision making (see chart for details).     Cervical radiculitis. Trial prednisone and muscle relaxer PRN for pain. Follow-up with PCP as needed. Final Clinical Impressions(s) / UC Diagnoses   Final diagnoses:  Cervical radiculitis   Discharge Instructions   None    ED Prescriptions    Medication Sig Dispense Auth. Provider   predniSONE (DELTASONE) 20 MG tablet Take 1 tablet (20 mg total) by mouth daily with breakfast for 5 days. 5 tablet Scot Jun, FNP   tiZANidine (ZANAFLEX) 4 MG tablet Take 1 tablet (4 mg total) by mouth at bedtime. 30 tablet Scot Jun, FNP     PDMP not reviewed this encounter.   Scot Jun, FNP 04/27/20 1758

## 2020-04-24 NOTE — ED Triage Notes (Signed)
Pt c/o left shoulder/back pain onset 2 days... believes it may be a pulled muscle.... pain increases w/activity or when she turns a certain way  Denies inj/trauma, strenuous activity.   Taking OTC Ibuprofen and acetaminophen w/no relief.   A&O x4... NAD.Marland Kitchen. ambulatory

## 2020-04-26 ENCOUNTER — Other Ambulatory Visit: Payer: Self-pay

## 2020-04-26 ENCOUNTER — Ambulatory Visit (INDEPENDENT_AMBULATORY_CARE_PROVIDER_SITE_OTHER): Payer: 59 | Admitting: Family Medicine

## 2020-04-26 ENCOUNTER — Encounter: Payer: Self-pay | Admitting: Family Medicine

## 2020-04-26 VITALS — BP 156/94 | HR 98 | Temp 98.0°F | Resp 16 | Ht 59.0 in | Wt 191.8 lb

## 2020-04-26 DIAGNOSIS — K219 Gastro-esophageal reflux disease without esophagitis: Secondary | ICD-10-CM | POA: Diagnosis not present

## 2020-04-26 DIAGNOSIS — R221 Localized swelling, mass and lump, neck: Secondary | ICD-10-CM | POA: Diagnosis not present

## 2020-04-26 MED ORDER — PANTOPRAZOLE SODIUM 40 MG PO TBEC: 40.0000 mg | DELAYED_RELEASE_TABLET | Freq: Every day | ORAL | 3 refills | Status: DC

## 2020-04-26 NOTE — Patient Instructions (Addendum)
  Health Maintenance, Female Adopting a healthy lifestyle and getting preventive care are important in promoting health and wellness. Ask your health care provider about:  The right schedule for you to have regular tests and exams.  Things you can do on your own to prevent diseases and keep yourself healthy. What should I know about diet, weight, and exercise? Eat a healthy diet  Eat a diet that includes plenty of vegetables, fruits, low-fat dairy products, and lean protein.  Do not eat a lot of foods that are high in solid fats, added sugars, or sodium.   Maintain a healthy weight Body mass index (BMI) is used to identify weight problems. It estimates body fat based on height and weight. Your health care provider can help determine your BMI and help you achieve or maintain a healthy weight. Get regular exercise Get regular exercise. This is one of the most important things you can do for your health. Most adults should:  Exercise for at least 150 minutes each week. The exercise should increase your heart rate and make you sweat (moderate-intensity exercise).  Do strengthening exercises at least twice a week. This is in addition to the moderate-intensity exercise.  Spend less time sitting. Even light physical activity can be beneficial. Watch cholesterol and blood lipids Have your blood tested for lipids and cholesterol at 53 years of age, then have this test every 5 years. Have your cholesterol levels checked more often if:  Your lipid or cholesterol levels are high.  You are older than 53 years of age.  You are at high risk for heart disease. What should I know about cancer screening? Depending on your health history and family history, you may need to have cancer screening at various ages. This may include screening for:  Breast cancer.  Cervical cancer.  Colorectal cancer.  Skin cancer.  Lung cancer. What should I know about heart disease, diabetes, and high blood  pressure? Blood pressure and heart disease  High blood pressure causes heart disease and increases the risk of stroke. This is more likely to develop in people who have high blood pressure readings, are of African descent, or are overweight.  Have your blood pressure checked: ? Every 3-5 years if you are 18-39 years of age. ? Every year if you are 40 years old or older. Diabetes Have regular diabetes screenings. This checks your fasting blood sugar level. Have the screening done:  Once every three years after age 40 if you are at a normal weight and have a low risk for diabetes.  More often and at a younger age if you are overweight or have a high risk for diabetes. What should I know about preventing infection? Hepatitis B If you have a higher risk for hepatitis B, you should be screened for this virus. Talk with your health care provider to find out if you are at risk for hepatitis B infection. Hepatitis C Testing is recommended for:  Everyone born from 1945 through 1965.  Anyone with known risk factors for hepatitis C. Sexually transmitted infections (STIs)  Get screened for STIs, including gonorrhea and chlamydia, if: ? You are sexually active and are younger than 53 years of age. ? You are older than 53 years of age and your health care provider tells you that you are at risk for this type of infection. ? Your sexual activity has changed since you were last screened, and you are at increased risk for chlamydia or gonorrhea. Ask your health   care provider if you are at risk.  Ask your health care provider about whether you are at high risk for HIV. Your health care provider may recommend a prescription medicine to help prevent HIV infection. If you choose to take medicine to prevent HIV, you should first get tested for HIV. You should then be tested every 3 months for as long as you are taking the medicine. Pregnancy  If you are about to stop having your period (premenopausal) and  you may become pregnant, seek counseling before you get pregnant.  Take 400 to 800 micrograms (mcg) of folic acid every day if you become pregnant.  Ask for birth control (contraception) if you want to prevent pregnancy. Osteoporosis and menopause Osteoporosis is a disease in which the bones lose minerals and strength with aging. This can result in bone fractures. If you are 65 years old or older, or if you are at risk for osteoporosis and fractures, ask your health care provider if you should:  Be screened for bone loss.  Take a calcium or vitamin D supplement to lower your risk of fractures.  Be given hormone replacement therapy (HRT) to treat symptoms of menopause. Follow these instructions at home: Lifestyle  Do not use any products that contain nicotine or tobacco, such as cigarettes, e-cigarettes, and chewing tobacco. If you need help quitting, ask your health care provider.  Do not use street drugs.  Do not share needles.  Ask your health care provider for help if you need support or information about quitting drugs. Alcohol use  Do not drink alcohol if: ? Your health care provider tells you not to drink. ? You are pregnant, may be pregnant, or are planning to become pregnant.  If you drink alcohol: ? Limit how much you use to 0-1 drink a day. ? Limit intake if you are breastfeeding.  Be aware of how much alcohol is in your drink. In the [place] one drink equals one 12 oz bottle of beer (355 mL), one 5 oz glass of wine (148 mL), or one 1 oz glass of hard liquor (44 mL). General instructions  Schedule regular health, dental, and eye exams.  Stay current with your vaccines.  Tell your health care provider if: ? You often feel depressed. ? You have ever been abused or do not feel safe at home. Summary  Adopting a healthy lifestyle and getting preventive care are important in promoting health and wellness.  Follow your health care provider's instructions about healthy  diet, exercising, and getting tested or screened for diseases.  Follow your health care provider's instructions on monitoring your cholesterol and blood pressure. This information is not intended to replace advice given to you by your health care provider. Make sure you discuss any questions you have with your health care provider. Document Revised: 02/10/2018 Document Reviewed: 02/10/2018 Elsevier Patient Education  2021 [employer]   If you have lab work done today you will be contacted with your lab results within the next 2 weeks.  If you have not heard from us then please contact us. The fastest way to get your results is to register for My Chart.   IF you received an x-ray today, you will receive an invoice from [facility]. Please contact [facility] at [phone] with questions or concerns regarding your invoice.   IF you received labwork today, you will receive an invoice from [facility]. Please contact [facility] at [phone] with questions or concerns regarding your invoice.   Our billing   staff will not be able to assist you with questions regarding bills from these companies.  You will be contacted with the lab results as soon as they are available. The fastest way to get your results is to activate your My Chart account. Instructions are located on the last page of this paperwork. If you have not heard from us regarding the results in 2 weeks, please contact this office.      

## 2020-04-26 NOTE — Progress Notes (Signed)
2/24/20224:30 PM  Alizae Bechtel May 13, 1965, 55 y.o., female [id number]  Chief Complaint  Patient presents with  . Sore Throat    Pt reports feeling lump in her throat hard to describe. Had mother pass in November and has felt this way since. History of anxiety appears most when she is having issue with anxiety     HPI:   Patient is a 55 y.o. female with past medical history significant for HTN, DM who presents today for tenderness to anterior neck.   Mom recently passed away Jan 26, 2023 This have given her anxiety Noticed a lump in her throat Her mom died of lung cancer Still sees Dr. Nolon Rod as her primary Gets her labs done thru there Blood work done for thyroid: no issues with that found  Verbalizes occasionally symptoms of GERD: frequent burping, occasional heartburn Denies pain with swallowing Her neck feels sensitive to touch but not painful She is worried it may be thyroid issues Would like thyroid cancer ruled out   Depression screen [facility] 2/9 04/26/2020 06/08/2019 03/09/2019  Decreased Interest 1 0 0  Down, Depressed, Hopeless 1 0 0  PHQ - 2 Score 2 0 0  Altered sleeping - - -  Tired, decreased energy - - -  Change in appetite - - -  Feeling bad or failure about yourself  - - -  Trouble concentrating - - -  Moving slowly or fidgety/restless - - -  Suicidal thoughts - - -  PHQ-9 Score - - -  Difficult doing work/chores - - -    Fall Risk  04/26/2020 06/08/2019 03/09/2019 02/08/2019 01/19/2019  Falls in the past year? 0 0 0 0 0  Number falls in past yr: 0 0 0 0 0  Injury with Fall? 0 0 0 0 0  Risk for fall due to : No Fall Risks - - - -  Follow up Falls evaluation completed Falls evaluation completed Falls evaluation completed - -     Allergies  Allergen Reactions  . Dilaudid [Hydromorphone Hcl] Other (See Comments)    Broke in sweat and started shaking, can take oral  . Sulfa Antibiotics Hives  . Tylox [Oxycodone-Acetaminophen] Nausea And Vomiting  .  Clindamycin/Lincomycin Rash    hives    Prior to Admission medications   Medication Sig Start Date End Date Taking? Authorizing Provider  acetaminophen (TYLENOL) 500 MG tablet Take 2 tablets (1,000 mg total) by mouth every 6 (six) hours as needed for mild pain. 11/04/19   Joseph Pierini, MD  cholecalciferol (VITAMIN D3) 25 MCG (1000 UNIT) tablet Take 1,000 Units by mouth daily.    [provider]  clonazePAM (KLONOPIN) 0.5 MG tablet Take 1 tablet (0.5 mg total) by mouth 2 (two) times daily as needed for anxiety. 03/09/19   Forrest Moron, MD  diclofenac sodium (VOLTAREN) 1 % GEL APPLY 4 GRAMS TOPICALLY 4 (FOUR) TIMES DAILY. RIGHT POSTERIOR TIBIAL TENDON Patient taking differently: To left elbow 12/01/17   Mcarthur Rossetti, MD  estradiol (ESTRACE) 1 MG tablet Take 1 tablet (1 mg total) by mouth daily. 02/14/20   Joseph Pierini, MD  furosemide (LASIX) 20 MG tablet Take 1 tablet (20 mg total) by mouth 2 (two) times daily as needed (fluid). 11/10/17   Stallings, Arlie Solomons, MD  JANUVIA 100 MG tablet TAKE 1 TABLET BY MOUTH DAILY AFTER BREAKFAST. 03/09/19   Delia Chimes A, MD  metFORMIN (GLUCOPHAGE) 500 MG tablet TAKE 1 TABLET BY MOUTH EVERY DAY WITH BREAKFAST 03/09/19  Forrest Moron, MD  Multiple Vitamins-Minerals (MULTIVITAMIN ADULT) CHEW Chew by mouth.    [provider]  olmesartan-hydrochlorothiazide (BENICAR HCT) 40-12.5 MG tablet Take 1 tablet by mouth daily. 12/22/18   Forrest Moron, MD  Potassium 75 MG TABS Take 75 mg by mouth daily.     [provider]  predniSONE (DELTASONE) 20 MG tablet Take 1 tablet (20 mg total) by mouth daily with breakfast for 5 days. 04/24/20 04/29/20  Scot Jun, FNP  progesterone (PROMETRIUM) 100 MG capsule TAKE 1 CAPSULE BY MOUTH EVERY DAY 03/23/20   Joseph Pierini, MD  promethazine (PHENERGAN) 25 MG tablet TAKE 1 TABLET EVERY 6 HOURS AS NEEDED FOR NAUSEA 12/16/16   [provider]  QUEtiapine (SEROQUEL) 25 MG  tablet Take 0.5 tablets (12.5 mg total) by mouth at bedtime. 03/09/19   Forrest Moron, MD  rizatriptan (MAXALT-MLT) 10 MG disintegrating tablet TAKE 1 TABLET BY MOUTH AS NEEDED FOR MIGRAINE. MAY REPEAT IN 2 HOURS IF NEEDED 12/22/18   Delia Chimes A, MD  rosuvastatin (CRESTOR) 10 MG tablet Take 1 tablet (10 mg total) by mouth daily after breakfast. 12/22/18   Delia Chimes A, MD  tiZANidine (ZANAFLEX) 4 MG tablet Take 1 tablet (4 mg total) by mouth at bedtime. 04/24/20   Scot Jun, FNP  traMADol (ULTRAM) 50 MG tablet Take 1-2 tablets (50-100 mg total) by mouth every 6 (six) hours as needed for severe pain. 10/31/19   Joseph Pierini, MD  zinc gluconate 50 MG tablet Take 50 mg by mouth daily.    [provider]    Past Medical History:  Diagnosis Date  . Abnormal uterine bleeding (AUB)   . Arthritis   . Diabetes mellitus   . Elevated cholesterol   . Endometrial polyp   . Endometrial polyp   . Endometriosis   . Hypertension   . Migraines   . Ovarian cyst     Past Surgical History:  Procedure Laterality Date  . COMBINED HYSTEROSCOPY DIAGNOSTIC / D&C  yrs ago  . DIAGNOSTIC LAPAROSCOPY  1993   with laser adhesions  . DILITATION & CURRETTAGE/HYSTROSCOPY WITH NOVASURE ABLATION N/A 11/04/2019   Procedure: DILATATION & CURETTAGE/HYSTEROSCOPY WITH NOVASURE ABLATION;  Surgeon: Joseph Pierini, MD;  Location: [place];  Service: Gynecology;  Laterality: N/A;  . INTRAUTERINE DEVICE INSERTION     mirena-Inserted 05-16-14  . KNEE SURGERY Left yrs ago   meniscurs tear repair  . mirena removed  2021  . OOPHORECTOMY  2008   left  . ROTATOR CUFF REPAIR Right yrs ago  . trichomoniasis  01/2013  . WRIST SURGERY Right    gang. cyst    Social History   Tobacco Use  . Smoking status: Never Smoker  . Smokeless tobacco: Never Used  Substance Use Topics  . Alcohol use: Yes    Alcohol/week: 0.0 standard drinks    Comment: rare    Family History  Problem  Relation Age of Onset  . Hypertension Mother   . Diabetes Mother   . COPD Mother   . Cancer Mother        Lung  . Hypertension Father   . Stroke Father   . Diabetes Sister   . Hypertension Brother   . Diabetes Maternal Grandfather   . Colon cancer Neg Hx   . Esophageal cancer Neg Hx   . Rectal cancer Neg Hx   . Stomach cancer Neg Hx     Review of Systems  Constitutional:  Negative for chills, fever and malaise/fatigue.  HENT:       Anterior neck tender  Eyes: Negative for blurred vision and double vision.  Respiratory: Negative for cough, shortness of breath and wheezing.   Cardiovascular: Negative for chest pain, palpitations and leg swelling.  Gastrointestinal: Positive for heartburn. Negative for abdominal pain, blood in stool, constipation, diarrhea, nausea and vomiting.  Genitourinary: Negative for dysuria, frequency and hematuria.  Musculoskeletal: Negative for back pain and joint pain.  Skin: Negative for rash.  Neurological: Negative for dizziness, weakness and headaches.     OBJECTIVE:  Today's Vitals   04/26/20 1555  BP: (!) 156/94  Pulse: 98  Resp: 16  Temp: 98 F (36.7 C)  TempSrc: Temporal  SpO2: 98%  Weight: 191 lb 12.8 oz (87 kg)  Height: 4\' 11"  (1.499 m)   Body mass index is 38.74 kg/m.   Physical Exam Constitutional:      General: She is not in acute distress.    Appearance: Normal appearance. She is not ill-appearing.  HENT:     Head: Normocephalic.     Mouth/Throat:     Mouth: Mucous membranes are moist.     Pharynx: Oropharynx is clear. No pharyngeal swelling, oropharyngeal exudate or posterior oropharyngeal erythema.  Neck:     Thyroid: No thyroid mass, thyromegaly or thyroid tenderness.  Cardiovascular:     Rate and Rhythm: Normal rate and regular rhythm.     Pulses: Normal pulses.     Heart sounds: Normal heart sounds. No murmur heard. No friction rub. No gallop.   Pulmonary:     Effort: Pulmonary effort is normal. No  respiratory distress.     Breath sounds: Normal breath sounds. No stridor. No wheezing, rhonchi or rales.  Abdominal:     General: Bowel sounds are normal.     Palpations: Abdomen is soft.     Tenderness: There is no abdominal tenderness.  Musculoskeletal:     Right lower leg: No edema.     Left lower leg: No edema.  Lymphadenopathy:     Cervical: No cervical adenopathy.  Skin:    General: Skin is warm and dry.  Neurological:     Mental Status: She is alert and oriented to person, place, and time.  Psychiatric:        Mood and Affect: Mood normal.        Behavior: Behavior normal.     No results found for this or any previous visit (from the past 24 hour(s)).  No results found.   ASSESSMENT and PLAN  Problem List Items Addressed This Visit   None   Visit Diagnoses    Gastroesophageal reflux disease without esophagitis    -  Primary   Relevant Medications   pantoprazole (PROTONIX) 40 MG tablet   Neck swelling       Relevant Orders   US THYROID   Thyroid Profile   CBC with Differential       Plan . Start daily protonix for one month then transition to prn . Will follow up with lab and [place] results . R/se/b of medications discussed . Follow up with Dr. Nolon Rod for BP management   Return in about 3 months (around 07/24/2020).    Huston Foley Cloyce Paterson, FNP-BC [facility] [place], [place] [address] Ph.  [phone] Fax [phone]

## 2020-04-27 ENCOUNTER — Encounter: Payer: Self-pay | Admitting: Family Medicine

## 2020-04-27 ENCOUNTER — Telehealth: Payer: Self-pay | Admitting: Family Medicine

## 2020-04-27 LAB — CBC WITH DIFFERENTIAL/PLATELET
Basophils Absolute: 0 10*3/uL (ref 0.0–0.2)
Basos: 0 %
EOS (ABSOLUTE): 0 10*3/uL (ref 0.0–0.4)
Eos: 0 %
Hematocrit: 41.8 % (ref 34.0–46.6)
Hemoglobin: 13.6 g/dL (ref 11.1–15.9)
Immature Grans (Abs): 0.1 10*3/uL (ref 0.0–0.1)
Immature Granulocytes: 1 %
Lymphocytes Absolute: 1.9 10*3/uL (ref 0.7–3.1)
Lymphs: 14 %
MCH: 26.7 pg (ref 26.6–33.0)
MCHC: 32.5 g/dL (ref 31.5–35.7)
MCV: 82 fL (ref 79–97)
Monocytes Absolute: 0.2 10*3/uL (ref 0.1–0.9)
Monocytes: 1 %
Neutrophils Absolute: 11.3 10*3/uL — ABNORMAL HIGH (ref 1.4–7.0)
Neutrophils: 84 %
Platelets: 389 10*3/uL (ref 150–450)
RBC: 5.09 x10E6/uL (ref 3.77–5.28)
RDW: 13.2 % (ref 11.7–15.4)
WBC: 13.5 10*3/uL — ABNORMAL HIGH (ref 3.4–10.8)

## 2020-04-27 LAB — THYROID PANEL
Free Thyroxine Index: 2.5 (ref 1.2–4.9)
T3 Uptake Ratio: 22 % — ABNORMAL LOW (ref 24–39)
T4, Total: 11.2 ug/dL (ref 4.5–12.0)

## 2020-04-27 NOTE — Telephone Encounter (Signed)
Pt states, yes, she does have questions about her lab results.  Pt would like call back.  She is worried about the infection.

## 2020-04-27 NOTE — Telephone Encounter (Signed)
Pt has questions about results

## 2020-05-08 ENCOUNTER — Other Ambulatory Visit: Payer: Self-pay | Admitting: Family Medicine

## 2020-05-08 ENCOUNTER — Ambulatory Visit
Admission: RE | Admit: 2020-05-08 | Discharge: 2020-05-08 | Disposition: A | Discharge status: Home / Self Care | Payer: 59 | Source: Ambulatory Visit | Attending: Family Medicine | Admitting: Family Medicine

## 2020-05-08 DIAGNOSIS — R9389 Abnormal findings on diagnostic imaging of other specified body structures: Secondary | ICD-10-CM

## 2020-05-08 DIAGNOSIS — R221 Localized swelling, mass and lump, neck: Secondary | ICD-10-CM

## 2020-05-08 NOTE — Progress Notes (Signed)
Due to abnormal ultrasound I have placed a referral to endocrinology to follow up with a biopsy.

## 2020-05-16 ENCOUNTER — Other Ambulatory Visit: Payer: Self-pay | Admitting: Family Medicine

## 2020-05-21 ENCOUNTER — Ambulatory Visit (INDEPENDENT_AMBULATORY_CARE_PROVIDER_SITE_OTHER): Payer: 59 | Admitting: Internal Medicine

## 2020-05-21 ENCOUNTER — Encounter: Payer: Self-pay | Admitting: Internal Medicine

## 2020-05-21 ENCOUNTER — Other Ambulatory Visit: Payer: Self-pay

## 2020-05-21 VITALS — BP 128/80 | Resp 18 | Ht <= 58 in | Wt 184.0 lb

## 2020-05-21 DIAGNOSIS — E041 Nontoxic single thyroid nodule: Secondary | ICD-10-CM

## 2020-05-21 LAB — TSH: TSH: 2.48 u[IU]/mL (ref 0.35–4.50)

## 2020-05-21 LAB — T4, FREE: Free T4: 1.1 ng/dL (ref 0.60–1.60)

## 2020-05-21 NOTE — Progress Notes (Signed)
Name: [name]  MRN/ DOB: [id number], 08-13-1965    Age/ Sex: 55 y.o., female    PCP: Forrest Moron, MD   Reason for Endocrinology Evaluation: Left thyroid nodule     Date of Initial Endocrinology Evaluation: 05/21/2020     HPI: Ms. [name] is a 55 y.o. female with a past medical history of T2DM and dyslipidemia. The patient presented for initial endocrinology clinic visit on 05/21/2020 for consultative assistance with her Left thyroid nodule    She was diagnosed with left thyroid nodule on ultrasound 05/2020.    She is accompanied by spouse today    She was having anxiety and palpitations around 2020 when her mother passed.   She was having back pain but no local neck symptoms  No local neck symptoms  Denies diarrhea  Denies termors No biotin intake   NO FH of thyroid disease         HISTORY:  Past Medical History:  Past Medical History:  Diagnosis Date   Abnormal uterine bleeding (AUB)    Arthritis    Diabetes mellitus    Elevated cholesterol    Endometrial polyp    Endometrial polyp    Endometriosis    Hypertension    Migraines    Ovarian cyst    Past Surgical History:  Past Surgical History:  Procedure Laterality Date   COMBINED HYSTEROSCOPY DIAGNOSTIC / D&C  yrs ago   [place]   with laser adhesions   DILITATION & CURRETTAGE/HYSTROSCOPY WITH NOVASURE ABLATION N/A 11/04/2019   Procedure: DILATATION & CURETTAGE/HYSTEROSCOPY WITH NOVASURE ABLATION;  Surgeon: Joseph Pierini, MD;  Location: [place];  Service: Gynecology;  Laterality: N/A;   INTRAUTERINE DEVICE INSERTION     mirena-Inserted 05-16-14   KNEE SURGERY Left yrs ago   meniscurs tear repair   mirena removed  2021   OOPHORECTOMY  2008   left   ROTATOR CUFF REPAIR Right yrs ago   trichomoniasis  01/2013   WRIST SURGERY Right    gang. cyst      Social History:  reports that she has never smoked. She  has never used smokeless tobacco. She reports current alcohol use. She reports that she does not use drugs.  Family History: family history includes COPD in her mother; Cancer in her mother; Diabetes in her maternal grandfather, mother, and sister; Hypertension in her brother, father, and mother; Stroke in her father.   HOME MEDICATIONS: Allergies as of 05/21/2020      Reactions   Dilaudid [hydromorphone Hcl] Other (See Comments)   Broke in sweat and started shaking, can take oral   Sulfa Antibiotics Hives   Tylox [oxycodone-acetaminophen] Nausea And Vomiting   Clindamycin/lincomycin Rash   hives      Medication List       Accurate as of May 21, 2020  1:33 PM. If you have any questions, ask your nurse or doctor.        acetaminophen 500 MG tablet Commonly known as: TYLENOL Take 2 tablets (1,000 mg total) by mouth every 6 (six) hours as needed for mild pain.   cholecalciferol 25 MCG (1000 UNIT) tablet Commonly known as: VITAMIN D3 Take 1,000 Units by mouth daily.   clonazePAM 0.5 MG tablet Commonly known as: KLONOPIN Take 1 tablet (0.5 mg total) by mouth 2 (two) times daily as needed for anxiety.   diclofenac sodium 1 % Gel Commonly known as: VOLTAREN APPLY 4 GRAMS TOPICALLY 4 (FOUR)  TIMES DAILY. RIGHT POSTERIOR TIBIAL TENDON What changed: See the new instructions.   estradiol 1 MG tablet Commonly known as: ESTRACE Take 1 tablet (1 mg total) by mouth daily.   furosemide 20 MG tablet Commonly known as: LASIX Take 1 tablet (20 mg total) by mouth 2 (two) times daily as needed (fluid).   Januvia 100 MG tablet Generic drug: sitaGLIPtin TAKE 1 TABLET BY MOUTH DAILY AFTER BREAKFAST.   metFORMIN 500 MG tablet Commonly known as: GLUCOPHAGE TAKE 1 TABLET BY MOUTH EVERY DAY WITH BREAKFAST   Multivitamin Adult Chew Chew by mouth.   olmesartan-hydrochlorothiazide 40-12.5 MG tablet Commonly known as: BENICAR HCT Take 1 tablet by mouth daily.   pantoprazole 40 MG  tablet Commonly known as: PROTONIX Take 1 tablet (40 mg total) by mouth daily.   Potassium 75 MG Tabs Take 75 mg by mouth daily.   progesterone 100 MG capsule Commonly known as: PROMETRIUM TAKE 1 CAPSULE BY MOUTH EVERY DAY   promethazine 25 MG tablet Commonly known as: PHENERGAN TAKE 1 TABLET EVERY 6 HOURS AS NEEDED FOR NAUSEA   QUEtiapine 25 MG tablet Commonly known as: SEROQUEL Take 0.5 tablets (12.5 mg total) by mouth at bedtime.   rizatriptan 10 MG disintegrating tablet Commonly known as: MAXALT-MLT TAKE 1 TABLET BY MOUTH AS NEEDED FOR MIGRAINE. MAY REPEAT IN 2 HOURS IF NEEDED   rosuvastatin 10 MG tablet Commonly known as: CRESTOR Take 1 tablet (10 mg total) by mouth daily after breakfast.   tiZANidine 4 MG tablet Commonly known as: Zanaflex Take 1 tablet (4 mg total) by mouth at bedtime.   traMADol 50 MG tablet Commonly known as: ULTRAM Take 1-2 tablets (50-100 mg total) by mouth every 6 (six) hours as needed for severe pain.   vitamin C 1000 MG tablet Take 1,000 mg by mouth daily.   zinc gluconate 50 MG tablet Take 50 mg by mouth daily.         REVIEW OF SYSTEMS: A comprehensive ROS was conducted with the patient and is negative except as per HPI and below:  Review of Systems  Gastrointestinal: Negative for diarrhea.  Psychiatric/Behavioral: The patient is nervous/anxious.        OBJECTIVE:  VS: BP 128/80 (BP Location: Left Arm, Patient Position: Sitting, Cuff Size: Normal)    Resp 18    Ht 4\' 10"  (1.473 m)    Wt 184 lb (83.5 kg)    BMI 38.46 kg/m    Wt Readings from Last 3 Encounters:  05/21/20 184 lb (83.5 kg)  04/26/20 191 lb 12.8 oz (87 kg)  03/12/20 192 lb 12.8 oz (87.5 kg)     EXAM: General: Pt appears well and is in NAD  Neck: General: Supple without adenopathy. Thyroid: Thyroid size normal.  No goiter or nodules appreciated. No thyroid bruit.  Lungs: Clear with good BS bilat with no rales, rhonchi, or wheezes  Heart: Auscultation:  RRR.  Abdomen: Normoactive bowel sounds, soft, nontender, without masses or organomegaly palpable  Extremities:  BL LE: No pretibial edema normal ROM and strength.  Neuro: Cranial nerves: II - XII grossly intact  Motor: Normal strength throughout DTRs: 2+ and symmetric in UE without delay in relaxation phase  Mental Status: Judgment, insight: Intact Memory: Intact for recent and remote events Mood and affect: No depression, anxiety, or agitation     DATA REVIEWED:  Results for LEELOO, SILVERTHORNE (MRN [id number]) as of 05/22/2020 09:07  Ref. Range 05/21/2020 12:13  TSH Latest Ref Range: 0.35 -  4.50 uIU/mL 2.48  T4,Free(Direct) Latest Ref Range: 0.60 - 1.60 ng/dL 1.10    Thyroid ULtrasound 05/08/2020 Estimated total number of nodules >/= 1 cm: 1  Number of spongiform nodules >/=  2 cm not described below (TR1): 0  Number of mixed cystic and solid nodules >/= 1.5 cm not described below ([place]): 0  _________________________________________________________  Nodule # 1:  Location: Left; mid  Maximum size: 1.5 cm; Other 2 dimensions: 1.0 x 0.7 cm  Composition: solid/almost completely solid (2)  Echogenicity: hypoechoic (2)  Shape: not taller-than-wide (0)  Margins: ill-defined (0)  Echogenic foci: none (0)  ACR TI-RADS total points: 4.  ACR TI-RADS risk category: TR4 (4-6 points).  ACR TI-RADS recommendations:  **Given size (>/= 1.5 cm) and appearance, fine needle aspiration of this moderately suspicious nodule should be considered based on TI-RADS criteria.  _________________________________________________________  IMPRESSION: Nodule 1 (TI-RADS 4) located in the mid left thyroid lobe measuring 1.5 x 1.0 x 0.7 cm meets criteria for FNA. ASSESSMENT/PLAN/RECOMMENDATIONS:   1. Left Thyroid Nodule :  - NO local neck symptoms  - Pt with symptoms of anxiety not related to thyroid  - She is biochemically euthyroid  - We reviewed her ultrasound  images and report, will proceed with FNA of the left thyroid nodule  - Pt in agreement of this      F/U in 6 months  Signed electronically by: Mack Guise, MD  [facility]  [address] [place], [place] [address] Phone: [phone] FAX: [phone]   CC: Forrest Moron, MD [phone] [address] [place] [address] Phone: [phone] Fax: [phone]   Return to Endocrinology clinic as below: Future Appointments  Date Time Provider [place]  11/26/2020  7:30 AM Jamiel Goncalves, Melanie Crazier, MD [id number] None

## 2020-05-22 ENCOUNTER — Encounter: Payer: Self-pay | Admitting: Internal Medicine

## 2020-06-05 ENCOUNTER — Ambulatory Visit
Admission: RE | Admit: 2020-06-05 | Discharge: 2020-06-05 | Disposition: A | Discharge status: Home / Self Care | Payer: 59 | Source: Ambulatory Visit | Attending: Internal Medicine | Admitting: Internal Medicine

## 2020-06-05 ENCOUNTER — Other Ambulatory Visit (HOSPITAL_COMMUNITY)
Admission: RE | Admit: 2020-06-05 | Discharge: 2020-06-05 | Disposition: A | Discharge status: Home / Self Care | Payer: 59 | Source: Ambulatory Visit | Attending: Student | Admitting: Student

## 2020-06-05 DIAGNOSIS — E041 Nontoxic single thyroid nodule: Secondary | ICD-10-CM

## 2020-06-06 ENCOUNTER — Encounter: Payer: Self-pay | Admitting: Internal Medicine

## 2020-06-06 LAB — CYTOLOGY - NON PAP

## 2020-06-12 ENCOUNTER — Encounter: Payer: Self-pay | Admitting: Internal Medicine

## 2020-06-12 NOTE — Telephone Encounter (Signed)
Needs OV to check it out

## 2020-06-13 ENCOUNTER — Encounter: Payer: Self-pay | Admitting: Internal Medicine

## 2020-06-13 ENCOUNTER — Ambulatory Visit (INDEPENDENT_AMBULATORY_CARE_PROVIDER_SITE_OTHER): Payer: 59 | Admitting: Internal Medicine

## 2020-06-13 ENCOUNTER — Other Ambulatory Visit: Payer: Self-pay

## 2020-06-13 VITALS — BP 136/86 | HR 88 | Ht <= 58 in | Wt 180.2 lb

## 2020-06-13 DIAGNOSIS — E041 Nontoxic single thyroid nodule: Secondary | ICD-10-CM

## 2020-06-13 NOTE — Progress Notes (Signed)
Name: [name]  MRN/ DOB: [id number], 02/02/1966    Age/ Sex: 55 y.o., female     PCP: Forrest Moron, MD   Reason for Endocrinology Evaluation: Left thyroid nodule      Initial Endocrinology Clinic Visit: 05/21/2020    PATIENT IDENTIFIER: [name] is a 55 y.o., female with a past medical history of  T2DM and dyslipidemia. She has followed with [facility] since 05/21/2020 for consultative assistance with management of her left thyroid nodule .   HISTORICAL SUMMARY: She was diagnosed with left thyroid nodule on ultrasound 05/2020. She is S/P FNA of the left mid nodule 1.5 cm on 06/05/2020 with benign cytology  She was having anxiety and palpitations around 2020 when her mother passed.    NO FH of thyroid disease   SUBJECTIVE:    Today (06/13/2020):  [name] is here for a follow up on local neck symptoms . She has noted discomfort at the suprasternal notch, "a sitting sensation"   She is S/P benign FNA 4/5th   HISTORY:  Past Medical History:  Past Medical History:  Diagnosis Date  . Abnormal uterine bleeding (AUB)   . Arthritis   . Diabetes mellitus   . Elevated cholesterol   . Endometrial polyp   . Endometrial polyp   . Endometriosis   . Hypertension   . Migraines   . Ovarian cyst    Past Surgical History:  Past Surgical History:  Procedure Laterality Date  . COMBINED HYSTEROSCOPY DIAGNOSTIC / D&C  yrs ago  . DIAGNOSTIC LAPAROSCOPY  1993   with laser adhesions  . DILITATION & CURRETTAGE/HYSTROSCOPY WITH NOVASURE ABLATION N/A 11/04/2019   Procedure: DILATATION & CURETTAGE/HYSTEROSCOPY WITH NOVASURE ABLATION;  Surgeon: Joseph Pierini, MD;  Location: [facility];  Service: Gynecology;  Laterality: N/A;  . INTRAUTERINE DEVICE INSERTION     mirena-Inserted 05-16-14  . KNEE SURGERY Left yrs ago   meniscurs tear repair  . mirena removed  2021  . OOPHORECTOMY  2008   left  . ROTATOR CUFF REPAIR Right  yrs ago  . trichomoniasis  01/2013  . WRIST SURGERY Right    gang. cyst    Social History:  reports that she has never smoked. She has never used smokeless tobacco. She reports current alcohol use. She reports that she does not use drugs. Family History:  Family History  Problem Relation Age of Onset  . Hypertension Mother   . Diabetes Mother   . COPD Mother   . Cancer Mother        Lung  . Hypertension Father   . Stroke Father   . Diabetes Sister   . Hypertension Brother   . Diabetes Maternal Grandfather   . Colon cancer Neg Hx   . Esophageal cancer Neg Hx   . Rectal cancer Neg Hx   . Stomach cancer Neg Hx      HOME MEDICATIONS: Allergies as of 06/13/2020      Reactions   Dilaudid [hydromorphone Hcl] Other (See Comments)   Broke in sweat and started shaking, can take oral   Sulfa Antibiotics Hives   Tylox [oxycodone-acetaminophen] Nausea And Vomiting   Clindamycin/lincomycin Rash   hives      Medication List       Accurate as of June 13, 2020  8:25 AM. If you have any questions, ask your nurse or doctor.        acetaminophen 500 MG tablet Commonly known as: TYLENOL Take  2 tablets (1,000 mg total) by mouth every 6 (six) hours as needed for mild pain.   cholecalciferol 25 MCG (1000 UNIT) tablet Commonly known as: VITAMIN D3 Take 1,000 Units by mouth daily.   clonazePAM 0.5 MG tablet Commonly known as: KLONOPIN Take 1 tablet (0.5 mg total) by mouth 2 (two) times daily as needed for anxiety.   diclofenac sodium 1 % Gel Commonly known as: VOLTAREN APPLY 4 GRAMS TOPICALLY 4 (FOUR) TIMES DAILY. RIGHT POSTERIOR TIBIAL TENDON What changed: See the new instructions.   estradiol 1 MG tablet Commonly known as: ESTRACE Take 1 tablet (1 mg total) by mouth daily.   furosemide 20 MG tablet Commonly known as: LASIX Take 1 tablet (20 mg total) by mouth 2 (two) times daily as needed (fluid).   Januvia 100 MG tablet Generic drug: sitaGLIPtin TAKE 1 TABLET BY MOUTH  DAILY AFTER BREAKFAST.   metFORMIN 500 MG tablet Commonly known as: GLUCOPHAGE TAKE 1 TABLET BY MOUTH EVERY DAY WITH BREAKFAST   Multivitamin Adult Chew Chew by mouth.   olmesartan-hydrochlorothiazide 40-12.5 MG tablet Commonly known as: BENICAR HCT Take 1 tablet by mouth daily.   pantoprazole 40 MG tablet Commonly known as: PROTONIX Take 1 tablet (40 mg total) by mouth daily.   Potassium 75 MG Tabs Take 75 mg by mouth daily.   progesterone 100 MG capsule Commonly known as: PROMETRIUM TAKE 1 CAPSULE BY MOUTH EVERY DAY   promethazine 25 MG tablet Commonly known as: PHENERGAN   QUEtiapine 25 MG tablet Commonly known as: SEROQUEL Take 0.5 tablets (12.5 mg total) by mouth at bedtime.   rizatriptan 10 MG disintegrating tablet Commonly known as: MAXALT-MLT TAKE 1 TABLET BY MOUTH AS NEEDED FOR MIGRAINE. MAY REPEAT IN 2 HOURS IF NEEDED   rosuvastatin 10 MG tablet Commonly known as: CRESTOR Take 1 tablet (10 mg total) by mouth daily after breakfast.   tiZANidine 4 MG tablet Commonly known as: Zanaflex Take 1 tablet (4 mg total) by mouth at bedtime.   traMADol 50 MG tablet Commonly known as: ULTRAM Take 1-2 tablets (50-100 mg total) by mouth every 6 (six) hours as needed for severe pain.   vitamin C 1000 MG tablet Take 1,000 mg by mouth daily.   zinc gluconate 50 MG tablet Take 50 mg by mouth daily.         OBJECTIVE:   PHYSICAL EXAM: VS: BP 136/86   Pulse 88   Ht 4\' 10"  (1.473 m)   Wt 180 lb 4 oz (81.8 kg)   SpO2 98%   BMI 37.67 kg/m    EXAM: General: Pt appears well and is in NAD  Neck: General: Supple without adenopathy. Thyroid: Thyroid size normal. Left isthmic nodule palpated, tender bruised area from the FNA site  Mental Status: Judgment, insight: Intact Memory: Intact for recent and remote events Mood and affect: No depression, anxiety, or agitation     DATA REVIEWED:  FNA 06/05/2020  FINAL MICROSCOPIC DIAGNOSIS:  - Consistent with  benign follicular nodule (Bethesda category II)   ASSESSMENT / PLAN / RECOMMENDATIONS:   1. Left thyroid Nodule :  - Exam in benign today, soft nodule palpated, this did not feel any larger then it has been . I wonder if the sensitivity is due to re- accumulation of fluid since she had an FNA recently.  - She was advised to apply ice packs and use tylenol if needed  - Reassurance provided     Signed electronically by: Mack Guise, MD  [facility]  [facility] [address] [place], [place] [address] Phone: [phone] FAX: [phone]      CC: Forrest Moron, MD [id number] [place] [address] Phone: [phone]  Fax: [phone]   Return to Endocrinology clinic as below: Future Appointments  Date Time Provider [place]  08/29/2020  4:00 PM Princess Bruins, MD [facility] None  11/26/2020  7:30 AM Tajah Schreiner, Melanie Crazier, MD [facility] None

## 2020-07-24 ENCOUNTER — Ambulatory Visit: Payer: 59 | Admitting: Family Medicine

## 2020-08-08 ENCOUNTER — Emergency Department (HOSPITAL_COMMUNITY): Payer: 59

## 2020-08-08 ENCOUNTER — Emergency Department (HOSPITAL_COMMUNITY)
Admission: EM | Admit: 2020-08-08 | Discharge: 2020-08-08 | Disposition: A | Discharge status: Home / Self Care | Payer: 59 | Attending: Emergency Medicine | Admitting: Emergency Medicine

## 2020-08-08 ENCOUNTER — Telehealth: Payer: Self-pay | Admitting: Cardiovascular Disease

## 2020-08-08 ENCOUNTER — Other Ambulatory Visit: Payer: Self-pay

## 2020-08-08 DIAGNOSIS — Z7984 Long term (current) use of oral hypoglycemic drugs: Secondary | ICD-10-CM | POA: Insufficient documentation

## 2020-08-08 DIAGNOSIS — R110 Nausea: Secondary | ICD-10-CM | POA: Diagnosis not present

## 2020-08-08 DIAGNOSIS — E119 Type 2 diabetes mellitus without complications: Secondary | ICD-10-CM | POA: Insufficient documentation

## 2020-08-08 DIAGNOSIS — R0789 Other chest pain: Secondary | ICD-10-CM | POA: Diagnosis present

## 2020-08-08 DIAGNOSIS — Z79899 Other long term (current) drug therapy: Secondary | ICD-10-CM | POA: Insufficient documentation

## 2020-08-08 DIAGNOSIS — I10 Essential (primary) hypertension: Secondary | ICD-10-CM | POA: Diagnosis not present

## 2020-08-08 DIAGNOSIS — R000 Tachycardia, unspecified: Secondary | ICD-10-CM | POA: Diagnosis not present

## 2020-08-08 DIAGNOSIS — R0602 Shortness of breath: Secondary | ICD-10-CM | POA: Diagnosis not present

## 2020-08-08 LAB — BASIC METABOLIC PANEL
Anion gap: 11 (ref 5–15)
BUN: 16 mg/dL (ref 6–20)
CO2: 28 mmol/L (ref 22–32)
Calcium: 9.6 mg/dL (ref 8.9–10.3)
Chloride: 100 mmol/L (ref 98–111)
Creatinine, Ser: 0.83 mg/dL (ref 0.44–1.00)
GFR, Estimated: >60 mL/min (ref >60)
Glucose, Bld: 114 mg/dL — ABNORMAL HIGH (ref 70–99)
Potassium: 3.4 mmol/L — ABNORMAL LOW (ref 3.5–5.1)
Sodium: 139 mmol/L (ref 135–145)

## 2020-08-08 LAB — CBC
HCT: 40.2 % (ref 36.0–46.0)
Hemoglobin: 12.8 g/dL (ref 12.0–15.0)
MCH: 26.5 pg (ref 26.0–34.0)
MCHC: 31.8 g/dL (ref 30.0–36.0)
MCV: 83.2 fL (ref 80.0–100.0)
Platelets: 320 10*3/uL (ref 150–400)
RBC: 4.83 MIL/uL (ref 3.87–5.11)
RDW: 13.6 % (ref 11.5–15.5)
WBC: 9.4 10*3/uL (ref 4.0–10.5)
nRBC: 0 % (ref 0.0–0.2)

## 2020-08-08 LAB — TROPONIN I (HIGH SENSITIVITY): Troponin I (High Sensitivity): <2 ng/L (ref <18)

## 2020-08-08 MED ORDER — ALUM & MAG HYDROXIDE-SIMETH 200-200-20 MG/5ML PO SUSP
30.0000 mL | Freq: Once | ORAL | Status: AC
  Administered 2020-08-08: 30 mL via ORAL
  Filled 2020-08-08: qty 30

## 2020-08-08 MED ORDER — LIDOCAINE VISCOUS HCL 2 % MT SOLN
15.0000 mL | Freq: Once | OROMUCOSAL | Status: AC
  Administered 2020-08-08: 15 mL via ORAL
  Filled 2020-08-08: qty 15

## 2020-08-08 NOTE — Telephone Encounter (Signed)
Pt c/o of Chest Pain: STAT if CP now or developed within 24 hours  1. Are you having CP right now? Yes for 2 weeks, pain goes around to the left side of her back  2. Are you experiencing any other symptoms (ex. SOB, nausea, vomiting, sweating)?  Nausea now  3. How long have you been experiencing CP? Pt has been experiencing the chest pain for 2 weeks but it's been everyday  4. Is your CP continuous or coming and going? Coming and going  5. Have you taken Nitroglycerin? Pt does not have Nitroglycerin ?

## 2020-08-08 NOTE — ED Provider Notes (Signed)
Emergency Medicine Provider Triage Evaluation Note  [name] , a 55 y.o. female  was evaluated in triage.  Pt complains of chest pain x 1 month that is intermittent.  She states it feels like a burning sensation in her chest and will radiate underneath her left breast to her left shoulder.  She states been seen by [place] before in the ER and cardiologist and told him nothing was wrong.  She is here asking for answers.  She states she has intermittent shortness of breath as well.  Denies any worsening of her symptoms with exertion.  Zofran given by EMS for nausea  Review of Systems  Positive: Chest pain, shortness of breath, nausea Negative: Fever, chills, cough, leg swelling  Physical Exam  BP (!) 138/95 (BP Location: Right Arm)   Pulse (!) 105   Temp 98.9 F (37.2 C) (Oral)   Resp 18   Ht 4\' 10"  (1.473 m)   Wt 82 kg   SpO2 98%   BMI 37.78 kg/m  Gen:   Awake, no distress   Resp:  Normal effort MSK:   Moves extremities without difficulty  Other:  HR ranging from 99-105 during exam. Patient anxious  Medical Decision Making  Medically screening exam initiated at 5:08 PM.  Appropriate orders placed.  [name] was informed that the remainder of the evaluation will be completed by another provider, this initial triage assessment does not replace that evaluation, and the importance of remaining in the ED until their evaluation is complete.  Cardiac work-up initiated.    Portions of this note were generated with Lobbyist. Dictation errors may occur despite best attempts at proofreading.    Barrie Folk, PA-C 08/08/20 1708    Dorie Rank, MD 08/09/20 [phone]

## 2020-08-08 NOTE — ED Provider Notes (Signed)
[facility] DEPT Provider Note   CSN: [id number] Arrival date & time: 08/08/20  1649     History Chief Complaint  Patient presents with  . Chest Pain  . Shortness of Breath    [name] is a 55 y.o. female.  The history is provided by the patient and medical records. No language interpreter was used.  Chest Pain Associated symptoms: shortness of breath   Shortness of Breath Associated symptoms: chest pain      55 year old female significant history of hypertension, diabetes, hypercholesterolemia who presents valuation of chest pain.  Patient endorsed intermittent chest pain ongoing for the past month.  Described more of a burning sensation in her chest that radiates to left breast and left shoulder.  Pain happen sporadically and sometimes waking her up at night.  Pain is not always exertional.  Some slow associated nausea without vomiting.  Occasional belching, and increased indigestion.  States pain happens on a daily basis.  No associated fever, productive cough, abnormal taste in mouth, shortness of breath, abdominal pain, or rash.  She denies alcohol or tobacco abuse.  Denies any significant cardiac history.  No prior history of PE or DVT no recent surgery prolonged bedrest active cancer hemoptysis leg swelling or calf pain.  She mention been seen by cardiologist in January for similar complaint and was told that is not coming from her heart.  She still endorse active chest pain throughout the day today  Past Medical History:  Diagnosis Date  . Abnormal uterine bleeding (AUB)   . Arthritis   . Diabetes mellitus   . Elevated cholesterol   . Endometrial polyp   . Endometrial polyp   . Endometriosis   . Hypertension   . Migraines   . Ovarian cyst     Patient Active Problem List   Diagnosis Date Noted  . Class 2 severe obesity due to excess calories with serious comorbidity and body mass index (BMI) of 39.0 to 39.9 in adult ([place])  03/09/2019  . Acute right-sided low back pain with right-sided sciatica 06/10/2017  . Trochanteric bursitis, right hip 05/27/2017  . Pain in right hip 05/27/2017  . Contusion, thigh and hip, left, sequela 01/08/2017  . Low back pain 01/08/2017  . IUD (intrauterine device) in place 05/16/2014  . Atypical chest pain 11/18/2012  . Injection site extravasation of IV contrast 11/18/2012  . DM (diabetes mellitus), type 2 ([place]) 11/18/2012  . Benign hypertension 11/18/2012  . Hypokalemia 11/18/2012  . Menopause 09/16/2011  . Elevated cholesterol     Past Surgical History:  Procedure Laterality Date  . COMBINED HYSTEROSCOPY DIAGNOSTIC / D&C  yrs ago  . DIAGNOSTIC LAPAROSCOPY  1993   with laser adhesions  . DILITATION & CURRETTAGE/HYSTROSCOPY WITH NOVASURE ABLATION N/A 11/04/2019   Procedure: DILATATION & CURETTAGE/HYSTEROSCOPY WITH NOVASURE ABLATION;  Surgeon: Joseph Pierini, MD;  Location: [facility];  Service: Gynecology;  Laterality: N/A;  . INTRAUTERINE DEVICE INSERTION     mirena-Inserted 05-16-14  . KNEE SURGERY Left yrs ago   meniscurs tear repair  . mirena removed  2021  . OOPHORECTOMY  2008   left  . ROTATOR CUFF REPAIR Right yrs ago  . trichomoniasis  01/2013  . WRIST SURGERY Right    gang. cyst     OB History    Gravida  2   Para  1   Term  1   Preterm      AB  1   Living  1     SAB      IAB      Ectopic      Multiple      Live Births              Family History  Problem Relation Age of Onset  . Hypertension Mother   . Diabetes Mother   . COPD Mother   . Cancer Mother        Lung  . Hypertension Father   . Stroke Father   . Diabetes Sister   . Hypertension Brother   . Diabetes Maternal Grandfather   . Colon cancer Neg Hx   . Esophageal cancer Neg Hx   . Rectal cancer Neg Hx   . Stomach cancer Neg Hx     Social History   Tobacco Use  . Smoking status: Never Smoker  . Smokeless tobacco: Never Used  Vaping Use  .  Vaping Use: Never used  Substance Use Topics  . Alcohol use: Yes    Alcohol/week: 0.0 standard drinks    Comment: rare  . Drug use: No    Home Medications Prior to Admission medications   Medication Sig Start Date End Date Taking? Authorizing Provider  acetaminophen (TYLENOL) 500 MG tablet Take 2 tablets (1,000 mg total) by mouth every 6 (six) hours as needed for mild pain. 11/04/19   Joseph Pierini, MD  Ascorbic Acid (VITAMIN C) 1000 MG tablet Take 1,000 mg by mouth daily.    [provider]  cholecalciferol (VITAMIN D3) 25 MCG (1000 UNIT) tablet Take 1,000 Units by mouth daily.    [provider]  clonazePAM (KLONOPIN) 0.5 MG tablet Take 1 tablet (0.5 mg total) by mouth 2 (two) times daily as needed for anxiety. 03/09/19   Forrest Moron, MD  diclofenac sodium (VOLTAREN) 1 % GEL APPLY 4 GRAMS TOPICALLY 4 (FOUR) TIMES DAILY. RIGHT POSTERIOR TIBIAL TENDON Patient taking differently: To left elbow 12/01/17   Mcarthur Rossetti, MD  estradiol (ESTRACE) 1 MG tablet Take 1 tablet (1 mg total) by mouth daily. 02/14/20   Joseph Pierini, MD  furosemide (LASIX) 20 MG tablet Take 1 tablet (20 mg total) by mouth 2 (two) times daily as needed (fluid). 11/10/17   Stallings, Arlie Solomons, MD  JANUVIA 100 MG tablet TAKE 1 TABLET BY MOUTH DAILY AFTER BREAKFAST. 03/09/19   Delia Chimes A, MD  metFORMIN (GLUCOPHAGE) 500 MG tablet TAKE 1 TABLET BY MOUTH EVERY DAY WITH BREAKFAST 03/09/19   Forrest Moron, MD  Multiple Vitamins-Minerals (MULTIVITAMIN ADULT) CHEW Chew by mouth.    [provider]  olmesartan-hydrochlorothiazide (BENICAR HCT) 40-12.5 MG tablet Take 1 tablet by mouth daily. 12/22/18   Forrest Moron, MD  pantoprazole (PROTONIX) 40 MG tablet Take 1 tablet (40 mg total) by mouth daily. 04/26/20   Just, Laurita Quint, FNP  Potassium 75 MG TABS Take 75 mg by mouth daily.     [provider]  progesterone (PROMETRIUM) 100 MG capsule TAKE 1 CAPSULE BY MOUTH EVERY DAY  03/23/20   Joseph Pierini, MD  promethazine (PHENERGAN) 25 MG tablet  12/16/16   [provider]  QUEtiapine (SEROQUEL) 25 MG tablet Take 0.5 tablets (12.5 mg total) by mouth at bedtime. 03/09/19   Forrest Moron, MD  rizatriptan (MAXALT-MLT) 10 MG disintegrating tablet TAKE 1 TABLET BY MOUTH AS NEEDED FOR MIGRAINE. MAY REPEAT IN 2 HOURS IF NEEDED 12/22/18   Delia Chimes A, MD  rosuvastatin (CRESTOR) 10 MG tablet Take  1 tablet (10 mg total) by mouth daily after breakfast. 12/22/18   Delia Chimes A, MD  tiZANidine (ZANAFLEX) 4 MG tablet Take 1 tablet (4 mg total) by mouth at bedtime. 04/24/20   Scot Jun, FNP  traMADol (ULTRAM) 50 MG tablet Take 1-2 tablets (50-100 mg total) by mouth every 6 (six) hours as needed for severe pain. 10/31/19   Joseph Pierini, MD  zinc gluconate 50 MG tablet Take 50 mg by mouth daily.    [provider]    Allergies    Dilaudid [hydromorphone hcl], Sulfa antibiotics, Tylox [oxycodone-acetaminophen], and Clindamycin/lincomycin  Review of Systems   Review of Systems  Respiratory: Positive for shortness of breath.   Cardiovascular: Positive for chest pain.  All other systems reviewed and are negative.   Physical Exam Updated Vital Signs BP (!) 137/96 (BP Location: Right Arm)   Pulse 95   Temp 98.9 F (37.2 C) (Oral)   Resp 18   Ht 4\' 10"  (1.473 m)   Wt 82 kg   SpO2 98%   BMI 37.78 kg/m   Physical Exam Vitals and nursing note reviewed.  Constitutional:      General: She is not in acute distress.    Appearance: She is well-developed.  HENT:     Head: Atraumatic.  Eyes:     Conjunctiva/sclera: Conjunctivae normal.  Cardiovascular:     Rate and Rhythm: Tachycardia present.     Pulses: Normal pulses.     Heart sounds: Normal heart sounds.  Pulmonary:     Effort: Pulmonary effort is normal.     Breath sounds: Normal breath sounds.  Abdominal:     Palpations: Abdomen is soft.     Tenderness: There is no abdominal  tenderness.  Musculoskeletal:        General: Normal range of motion.     Cervical back: Neck supple.  Skin:    Findings: No rash.  Neurological:     Mental Status: She is alert and oriented to person, place, and time.  Psychiatric:        Mood and Affect: Mood normal.     ED Results / Procedures / Treatments   Labs (all labs ordered are listed, but only abnormal results are displayed) Labs Reviewed  BASIC METABOLIC PANEL - Abnormal; Notable for the following components:      Result Value   Potassium 3.4 (*)    Glucose, Bld 114 (*)    All other components within normal limits  CBC  TROPONIN I (HIGH SENSITIVITY)    EKG None  ED ECG REPORT   Date: 08/08/2020  Rate: 99  Rhythm: normal sinus rhythm  QRS Axis: left  Intervals: normal  ST/T Wave abnormalities: nonspecific ST changes  Conduction Disutrbances:left anterior fascicular block  Narrative Interpretation:   Old EKG Reviewed: unchanged  I have personally reviewed the EKG tracing and agree with the computerized printout as noted.   Radiology DG Chest 2 View  Result Date: 08/08/2020 CLINICAL DATA:  Chest pain and shortness of breath. EXAM: CHEST - 2 VIEW COMPARISON:  02/06/2020 FINDINGS: The cardiomediastinal contours are normal. The lungs are clear. Pulmonary vasculature is normal. No consolidation, pleural effusion, or pneumothorax. No acute osseous abnormalities are seen. IMPRESSION: Negative radiographs of the chest. Electronically Signed   By: Keith Rake M.D.   On: 08/08/2020 18:10    Procedures Procedures   Medications Ordered in ED Medications  alum & mag hydroxide-simeth (MAALOX/MYLANTA) 200-200-20 MG/5ML suspension 30 mL (has no administration  in time range)    And  lidocaine (XYLOCAINE) 2 % viscous mouth solution 15 mL (has no administration in time range)    ED Course  I have reviewed the triage vital signs and the nursing notes.  Pertinent labs & imaging results that were available during  my care of the patient were reviewed by me and considered in my medical decision making (see chart for details).    MDM Rules/Calculators/A&P                          BP (!) 142/93   Pulse (!) 101   Temp 98.9 F (37.2 C) (Oral)   Resp 16   Ht 4\' 10"  (1.473 m)   Wt 82 kg   SpO2 98%   BMI 37.78 kg/m   Final Clinical Impression(s) / ED Diagnoses Final diagnoses:  Atypical chest pain    Rx / DC Orders ED Discharge Orders    None     8:47 PM Patient here with recurrent chest pain ongoing for more than a month likely not ACS.  Low suspicion for PE as well.  Patient's cardiologist, Dr. Audie Box is aware of patient going to the ER.  Anticipate if work-up negative, cardiology will set up for a cardiac CTA in the near future.  Today's EKG is unchanged from prior, normal troponin, labs are reassuring.  9:55 PM Today's work-up is unremarkable.  Encourage patient to reach out to her cardiologist for outpatient evaluation.  Return precaution given.   Domenic Moras, PA-C 08/08/20 2158    Daleen Bo, MD 08/09/20 [phone]

## 2020-08-08 NOTE — Telephone Encounter (Signed)
Received a call back from patient she stated she has been having chest pain radiating into back up into left shoulder blade off and on for the past 2 weeks.No chest pain at present.Stated Dr.O'Neal advised her to call back if she continues to have chest pain.Message sent to Dr.O'Neal for advice.

## 2020-08-08 NOTE — Telephone Encounter (Signed)
Can we set her up to see me?  Almyra Free maybe we can overbook her somewhere.  Likely we will set her up for a cardiac CTA.  [place] T. Audie Box, MD, [facility]  [address], [place], [place] [address] [phone]  4:54 PM

## 2020-08-08 NOTE — Telephone Encounter (Signed)
Returned call to patient no answer.Left message to call back. 

## 2020-08-08 NOTE — Discharge Instructions (Signed)
Please call and follow up closely with your cardiologist for outpatient evaluation of your chest pain.  You will likely benefit from a coronary CTA.  Continue taking pantoprazole as your pain may be related to heart burn.

## 2020-08-08 NOTE — ED Triage Notes (Signed)
Arrives via EMS from work, C/C chest pain and SOB x2 weeks. Endorses nausea w/o emesis. Has seen a cardiologist in the past w/o answers. Vitals WNL, lung sounds clear per EMS. Gave 4 mg Zofran through a 20 G LAC.

## 2020-08-08 NOTE — Telephone Encounter (Signed)
Called patient, advised of message from MD.  Patient currently in ED. Advised that we would let ED work her up and to let us know we could get her in.   Per Dr.O'Neal if workup is negative we will do Cardiac CTA instead of coming in for visit to see what that shows.

## 2020-08-29 ENCOUNTER — Other Ambulatory Visit: Payer: Self-pay

## 2020-08-29 ENCOUNTER — Other Ambulatory Visit (HOSPITAL_COMMUNITY)
Admission: RE | Admit: 2020-08-29 | Discharge: 2020-08-29 | Disposition: A | Discharge status: Home / Self Care | Payer: 59 | Source: Ambulatory Visit | Attending: Obstetrics & Gynecology | Admitting: Obstetrics & Gynecology

## 2020-08-29 ENCOUNTER — Ambulatory Visit (INDEPENDENT_AMBULATORY_CARE_PROVIDER_SITE_OTHER): Payer: 59 | Admitting: Obstetrics & Gynecology

## 2020-08-29 ENCOUNTER — Encounter: Payer: Self-pay | Admitting: Obstetrics & Gynecology

## 2020-08-29 VITALS — BP 124/80 | Ht 59.0 in | Wt 168.0 lb

## 2020-08-29 DIAGNOSIS — Z01419 Encounter for gynecological examination (general) (routine) without abnormal findings: Secondary | ICD-10-CM | POA: Insufficient documentation

## 2020-08-29 DIAGNOSIS — E6609 Other obesity due to excess calories: Secondary | ICD-10-CM

## 2020-08-29 DIAGNOSIS — Z6833 Body mass index (BMI) 33.0-33.9, adult: Secondary | ICD-10-CM

## 2020-08-29 DIAGNOSIS — Z780 Asymptomatic menopausal state: Secondary | ICD-10-CM | POA: Diagnosis not present

## 2020-08-29 NOTE — Progress Notes (Signed)
[name] 1965-05-06 [id number]   History:    55 y.o. G2P1A1L1  RP:  Established patient presenting for annual gyn exam   HPI:  S/P Hysteroscopy, dilation and curettage, polypectomy, NovaSure endometrial ablation performed on 11/04/2019.  Postmenopausal on HRT.  No PMB.  Would like to stop HRT.  No pelvic pain.  No pain with IC.  Breasts normal.  Urine/BMs normal.  BMI 33.93.  Health labs with Fam MD.  Past medical history,surgical history, family history and social history were all reviewed and documented in the EPIC chart.  Gynecologic History No LMP recorded (lmp unknown). Patient is postmenopausal.  Obstetric History OB History  Gravida Para Term Preterm AB Living  2 1 1   1 1   SAB IAB Ectopic Multiple Live Births               # Outcome Date GA Lbr Len/2nd Weight Sex Delivery Anes PTL Lv  2 AB           1 Term              ROS: A ROS was performed and pertinent positives and negatives are included in the history.  GENERAL: No fevers or chills. HEENT: No change in vision, no earache, sore throat or sinus congestion. NECK: No pain or stiffness. CARDIOVASCULAR: No chest pain or pressure. No palpitations. PULMONARY: No shortness of breath, cough or wheeze. GASTROINTESTINAL: No abdominal pain, nausea, vomiting or diarrhea, melena or bright red blood per rectum. GENITOURINARY: No urinary frequency, urgency, hesitancy or dysuria. MUSCULOSKELETAL: No joint or muscle pain, no back pain, no recent trauma. DERMATOLOGIC: No rash, no itching, no lesions. ENDOCRINE: No polyuria, polydipsia, no heat or cold intolerance. No recent change in weight. HEMATOLOGICAL: No anemia or easy bruising or bleeding. NEUROLOGIC: No headache, seizures, numbness, tingling or weakness. PSYCHIATRIC: No depression, no loss of interest in normal activity or change in sleep pattern.     Exam:   BP 124/80   Ht 4\' 11"  (1.499 m)   Wt 168 lb (76.2 kg)   LMP  (LMP Unknown)   BMI 33.93 kg/m   Body  mass index is 33.93 kg/m.  General appearance : Well developed well nourished female. No acute distress HEENT: Eyes: no retinal hemorrhage or exudates,  Neck supple, trachea midline, no carotid bruits, no thyroidmegaly Lungs: Clear to auscultation, no rhonchi or wheezes, or rib retractions  Heart: Regular rate and rhythm, no murmurs or gallops Breast:Examined in sitting and supine position were symmetrical in appearance, no palpable masses or tenderness,  no skin retraction, no nipple inversion, no nipple discharge, no skin discoloration, no axillary or supraclavicular lymphadenopathy Abdomen: no palpable masses or tenderness, no rebound or guarding Extremities: no edema or skin discoloration or tenderness  Pelvic: Vulva: Normal             Vagina: No gross lesions or discharge  Cervix: No gross lesions or discharge.  Pap reflex done.  Uterus  AV, normal size, shape and consistency, non-tender and mobile  Adnexa  Without masses or tenderness  Anus: Normal   Assessment/Plan:  55 y.o. female for annual exam   1. Encounter for routine gynecological examination with Papanicolaou smear of cervix Normal gynecologic exam.  Pap reflex done today.  Breast exam normal.  Screening mammogram November 2021 was negative.  Colonoscopy 2019.  Health labs with family physician. - Cytology - PAP( [facility])  2. Postmenopause Patient would like to stop hormone replacement therapy because of  concerns about the risk of breast cancer.  Counseling done on hormone replacement therapy with discussion of risks and benefits.  Decision to stop HRT.  Recommend vitamin D supplements, calcium intake of 1.5 g/day total and regular weightbearing physical activities.  3. Class 1 obesity due to excess calories with serious comorbidity and body mass index (BMI) of 33.0 to 33.9 in adult  Recommend a lower calorie/carb diet.  Aerobic activities 5 times a week and light weightlifting every 2 days.  Princess Bruins MD,  4:24 PM 08/29/2020

## 2020-09-04 LAB — CYTOLOGY - PAP: Diagnosis: NEGATIVE

## 2020-09-06 ENCOUNTER — Ambulatory Visit: Payer: 59 | Admitting: Medical

## 2020-09-07 ENCOUNTER — Ambulatory Visit (INDEPENDENT_AMBULATORY_CARE_PROVIDER_SITE_OTHER): Payer: 59 | Admitting: Cardiovascular Disease

## 2020-09-07 ENCOUNTER — Encounter: Payer: Self-pay | Admitting: Cardiovascular Disease

## 2020-09-07 ENCOUNTER — Other Ambulatory Visit: Payer: Self-pay

## 2020-09-07 VITALS — BP 128/70 | HR 74 | Ht 59.0 in | Wt 166.8 lb

## 2020-09-07 DIAGNOSIS — R079 Chest pain, unspecified: Secondary | ICD-10-CM

## 2020-09-07 LAB — BASIC METABOLIC PANEL
BUN/Creatinine Ratio: 17 (ref 9–23)
BUN: 15 mg/dL (ref 6–24)
CO2: 25 mmol/L (ref 20–29)
Calcium: 9.9 mg/dL (ref 8.7–10.2)
Chloride: 100 mmol/L (ref 96–106)
Creatinine, Ser: 0.87 mg/dL (ref 0.57–1.00)
Glucose: 89 mg/dL (ref 65–99)
Potassium: 4.3 mmol/L (ref 3.5–5.2)
Sodium: 141 mmol/L (ref 134–144)
eGFR: 80 mL/min/{1.73_m2} (ref >59)

## 2020-09-07 MED ORDER — METOPROLOL TARTRATE 100 MG PO TABS: ORAL_TABLET | ORAL | 0 refills | Status: DC

## 2020-09-07 NOTE — Patient Instructions (Signed)
Medication Instructions:  Take Metoprolol 100 mg two hours before the CT when scheduled.   *If you need a refill on your cardiac medications before your next appointment, please call your pharmacy*   Lab Work: BMET today  If you have labs (blood work) drawn today and your tests are completely normal, you will receive your results only by: [place] (if you have MyChart) OR A paper copy in the mail If you have any lab test that is abnormal or we need to change your treatment, we will call you to review the results.   Testing/Procedures: Your physician has requested that you have cardiac CT. Cardiac computed tomography (CT) is a painless test that uses an x-ray machine to take clear, detailed pictures of your heart. For further information please visit [email or web address]. Please follow instruction sheet as given.    Follow-Up: At [facility], you and your health needs are our priority.  As part of our continuing mission to provide you with exceptional heart care, we have created designated Provider Care Teams.  These Care Teams include your primary Cardiologist (physician) and Advanced Practice Providers (APPs -  Physician Assistants and Nurse Practitioners) who all work together to provide you with the care you need, when you need it.  We recommend signing up for the patient portal called "MyChart".  Sign up information is provided on this After Visit Summary.  MyChart is used to connect with patients for Virtual Visits (Telemedicine).  Patients are able to view lab/test results, encounter notes, upcoming appointments, etc.  Non-urgent messages can be sent to your provider as well.   To learn more about what you can do with MyChart, go to [email or web address].    Your next appointment:   3 month(s)  The format for your next appointment:   In Person  Provider:   Eleonore Chiquito, MD   Other Instructions   Your cardiac CT will be scheduled at one of the below locations:    [facility] [address] [place], [place] [address] [phone]  [place] [address] [place], [place] [address] [phone]  If scheduled at [facility], please arrive at the [facility] main entrance (entrance A) of [facility] 30 minutes prior to test start time. Proceed to the [facility] Radiology Department (first floor) to check-in and test prep.  If scheduled at [facility], please arrive 15 mins early for check-in and test prep.  Please follow these instructions carefully (unless otherwise directed):  Hold all erectile dysfunction medications at least 3 days (72 hrs) prior to test.  On the Night Before the Test: Be sure to Drink plenty of water. Do not consume any caffeinated/decaffeinated beverages or chocolate 12 hours prior to your test. Do not take any antihistamines 12 hours prior to your test.  On the Day of the Test: Drink plenty of water until 1 hour prior to the test. Do not eat any food 4 hours prior to the test. You may take your regular medications prior to the test.  Take metoprolol (Lopressor) two hours prior to test. HOLD Furosemide/Hydrochlorothiazide morning of the test. FEMALES- please wear underwire-free bra if available       After the Test: Drink plenty of water. After receiving IV contrast, you may experience a mild flushed feeling. This is normal. On occasion, you may experience a mild rash up to 24 hours after the test. This is not dangerous. If this  occurs, you can take Benadryl 25 mg and increase your fluid intake. If you experience trouble breathing, this can be serious. If it is severe call 911 IMMEDIATELY. If it is mild, please call our office. If you take any of these medications: Glipizide/Metformin, Avandament, Glucavance, please do not take 48 hours after completing test unless otherwise instructed.   Once we have  confirmed authorization from your insurance company, we will call you to set up a date and time for your test. Based on how quickly your insurance processes prior authorizations requests, please allow up to 4 weeks to be contacted for scheduling your Cardiac CT appointment. Be advised that routine Cardiac CT appointments could be scheduled as many as 8 weeks after your provider has ordered it.  For non-scheduling related questions, please contact the cardiac imaging nurse navigator should you have any questions/concerns: Marchia Bond, Cardiac Imaging Nurse Navigator Gordy Clement, Cardiac Imaging Nurse Navigator [facility] Direct Office Dial: [phone]   For scheduling needs, including cancellations and rescheduling, please call [place], [phone].

## 2020-09-07 NOTE — Progress Notes (Signed)
Cardiology Office Note:   Date:  09/07/2020  NAME:  [name]    MRN: [id number] DOB:  May 01, 1965   PCP:  Forrest Moron, MD  Cardiologist:  None  Electrophysiologist:  None   Referring MD: Forrest Moron, MD   Chief Complaint  Patient presents with   Chest Pain    History of Present Illness:   [name] is a 55 y.o. female with a hx of DM, HTN, HLD who presents for follow-up. Seen 03/12/2020 for atypical CP associated with stress and we decided to not pursue testing. Recurrence of CP 08/08/2020 and follow-up from ER visit. Normal enzymes and normal EKG at that visit.  She reports she was seen in the emergency room for tightness in her chest.  She reports she was at work and began to have tightness in her chest.  She also reports shortness of breath.  She had just gotten up to go to the bathroom.  She reports the symptoms lasted several hours and then resolved without real intervention.  She has continued to have intermittent symptoms.  She reports last week she was awoken from her sleep with tightness in her chest.  Symptoms resolved.  She not had any further shortness of breath.  She has done well.  She has lost roughly 30 pounds since her last visit.  Her A1c is down to 6.5.  Her cholesterol level looks great.  Her EKG today is nonischemic.  There is misplaced precordial leads but otherwise normal.  She reports that she does feel tightness in her chest that does improve slightly with muscle relaxers.  She has been placed on Protonix as well.  She denies any exertional component.  She is exercising.  She is working on her diet.  I suspect this could be acid reflux or muscle spasms.  She reports that her stress is still there.  She is still grieving the loss of her mother from November 2020.  She reports that she does not feel overtly stressed.  She just wants to make sure her heart is a okay.  No real identifiable triggers to her symptoms.  No alleviating factors.   Symptoms are still occurring every few days.   Problem List 1. HTN 2. DM -A1c 6.5 3. HLD -T chol 124, HDL 58, LDL 49, TG 92 4. Obesity -BMI 33  Past Medical History: Past Medical History:  Diagnosis Date   Abnormal uterine bleeding (AUB)    Arthritis    Diabetes mellitus    Elevated cholesterol    Endometrial polyp    Endometrial polyp    Endometriosis    Hypertension    Migraines    Nodule    Ovarian cyst     Past Surgical History: Past Surgical History:  Procedure Laterality Date   COMBINED HYSTEROSCOPY DIAGNOSTIC / D&C  yrs ago   [place]   with laser adhesions   DILITATION & CURRETTAGE/HYSTROSCOPY WITH NOVASURE ABLATION N/A 11/04/2019   Procedure: DILATATION & CURETTAGE/HYSTEROSCOPY WITH NOVASURE ABLATION;  Surgeon: Joseph Pierini, MD;  Location: [facility];  Service: Gynecology;  Laterality: N/A;   INTRAUTERINE DEVICE INSERTION     mirena-Inserted 05-16-14   KNEE SURGERY Left yrs ago   meniscurs tear repair   mirena removed  2021   OOPHORECTOMY  2008   left   ROTATOR CUFF REPAIR Right yrs ago   trichomoniasis  01/2013   WRIST SURGERY Right    gang. cyst  Current Medications: Current Meds  Medication Sig   acetaminophen (TYLENOL) 500 MG tablet Take 2 tablets (1,000 mg total) by mouth every 6 (six) hours as needed for mild pain.   Ascorbic Acid (VITAMIN C) 1000 MG tablet Take 1,000 mg by mouth daily.   cholecalciferol (VITAMIN D3) 25 MCG (1000 UNIT) tablet Take 1,000 Units by mouth daily.   clonazePAM (KLONOPIN) 0.5 MG tablet Take 1 tablet (0.5 mg total) by mouth 2 (two) times daily as needed for anxiety.   diclofenac sodium (VOLTAREN) 1 % GEL APPLY 4 GRAMS TOPICALLY 4 (FOUR) TIMES DAILY. RIGHT POSTERIOR TIBIAL TENDON (Patient taking differently: To left elbow)   furosemide (LASIX) 20 MG tablet Take 1 tablet (20 mg total) by mouth 2 (two) times daily as needed (fluid).   JANUVIA 100 MG tablet TAKE 1 TABLET BY MOUTH  DAILY AFTER BREAKFAST.   metFORMIN (GLUCOPHAGE) 500 MG tablet TAKE 1 TABLET BY MOUTH EVERY DAY WITH BREAKFAST   metoprolol tartrate (LOPRESSOR) 100 MG tablet Take 1 tablet by mouth once for procedure.   olmesartan-hydrochlorothiazide (BENICAR HCT) 40-12.5 MG tablet Take 1 tablet by mouth daily.   pantoprazole (PROTONIX) 40 MG tablet Take 1 tablet (40 mg total) by mouth daily.   Potassium 75 MG TABS Take 75 mg by mouth daily.    promethazine (PHENERGAN) 25 MG tablet    QUEtiapine (SEROQUEL) 25 MG tablet Take 0.5 tablets (12.5 mg total) by mouth at bedtime.   rizatriptan (MAXALT-MLT) 10 MG disintegrating tablet TAKE 1 TABLET BY MOUTH AS NEEDED FOR MIGRAINE. MAY REPEAT IN 2 HOURS IF NEEDED   rosuvastatin (CRESTOR) 10 MG tablet Take 1 tablet (10 mg total) by mouth daily after breakfast.   tiZANidine (ZANAFLEX) 4 MG tablet Take 1 tablet (4 mg total) by mouth at bedtime.   traMADol (ULTRAM) 50 MG tablet Take 1-2 tablets (50-100 mg total) by mouth every 6 (six) hours as needed for severe pain.   zinc gluconate 50 MG tablet Take 50 mg by mouth daily.     Allergies:    Dilaudid [hydromorphone hcl], Sulfa antibiotics, Tylox [oxycodone-acetaminophen], and Clindamycin/lincomycin   Social History: Social History   Socioeconomic History   Marital status: Married    Spouse name: Not on file   Number of children: 1   Years of education: Not on file   Highest education level: Not on file  Occupational History   Not on file  Tobacco Use   Smoking status: Never   Smokeless tobacco: Never  Vaping Use   Vaping Use: Never used  Substance and Sexual Activity   Alcohol use: Yes    Alcohol/week: 0.0 standard drinks    Comment: rare   Drug use: No   Sexual activity: Yes    Birth control/protection: None  Other Topics Concern   Not on file  Social History Narrative   Not on file   Social Determinants of Health   Financial Resource Strain: Not on file  Food Insecurity: Not on file   Transportation Needs: Not on file  Physical Activity: Not on file  Stress: Not on file  Social Connections: Not on file     Family History: The patient's family history includes COPD in her mother; Cancer in her mother; Diabetes in her maternal grandfather, mother, and sister; Hypertension in her brother, father, and mother; Stroke in her father. There is no history of Colon cancer, Esophageal cancer, Rectal cancer, or Stomach cancer.  ROS:   All other ROS reviewed and negative. Pertinent positives  noted in the HPI.     EKGs/Labs/Other Studies Reviewed:   The following studies were personally reviewed by me today:  EKG:  EKG is ordered today.  The ekg ordered today demonstrates normal sinus rhythm, nonspecific ST-T changes, incorrect limb lead position, and was personally reviewed by me.  TTE 03/27/2020  1. Left ventricular ejection fraction, by estimation, is 65 to 70%. The  left ventricle has normal function. The left ventricle has no regional  wall motion abnormalities. Left ventricular diastolic parameters were  normal.   2. Right ventricular systolic function is normal. The right ventricular  size is normal.   3. The mitral valve is normal in structure. No evidence of mitral valve  regurgitation. No evidence of mitral stenosis.   4. The aortic valve was not well visualized. Aortic valve regurgitation  is not visualized. No aortic stenosis is present.   5. The inferior vena cava is normal in size with greater than 50%  respiratory variability, suggesting right atrial pressure of 3 mmHg.    Recent Labs: 05/21/2020: TSH 2.48 08/08/2020: BUN 16; Creatinine, Ser 0.83; Hemoglobin 12.8; Platelets 320; Potassium 3.4; Sodium 139   Recent Lipid Panel    Component Value Date/Time   CHOL 124 12/22/2018 0944   TRIG 92 12/22/2018 0944   HDL 58 12/22/2018 0944   CHOLHDL 2.1 12/22/2018 0944   LDLCALC 49 12/22/2018 0944    Physical Exam:   VS:  BP 128/70   Pulse 74   Ht 4\' 11"   (1.499 m)   Wt 166 lb 12.8 oz (75.7 kg)   LMP  (LMP Unknown)   SpO2 92%   BMI 33.69 kg/m    Wt Readings from Last 3 Encounters:  09/07/20 166 lb 12.8 oz (75.7 kg)  08/29/20 168 lb (76.2 kg)  08/08/20 180 lb 12.4 oz (82 kg)    General: Well nourished, well developed, in no acute distress Head: Atraumatic, normal size  Eyes: PEERLA, EOMI  Neck: Supple, no JVD Endocrine: No thryomegaly Cardiac: Normal S1, S2; RRR; no murmurs, rubs, or gallops Lungs: Clear to auscultation bilaterally, no wheezing, rhonchi or rales  Abd: Soft, nontender, no hepatomegaly  Ext: No edema, pulses 2+ Musculoskeletal: No deformities, BUE and BLE strength normal and equal Skin: Warm and dry, no rashes   Neuro: Alert and oriented to person, place, time, and situation, CNII-XII grossly intact, no focal deficits  Psych: Normal mood and affect   ASSESSMENT:   [name] is a 55 y.o. female who presents for the following: 1. Chest pain, unspecified type     PLAN:   1. Chest pain, unspecified type -Atypical sharp and tightness in her chest.  Occurs randomly.  Recent ER visit negative for ischemia with normal troponins.  EKG in office is nonischemic.  There are misplaced precordial leads.  Her BP is well controlled.  She has lost 30 pounds.  Her CVD risk factors include diabetes which is controlled with an A1c of 6.5.  Most recent LDL cholesterol is at goal.  She is on a statin given her diabetes history.  Overall I suspect this is noncardiac chest pain.  We will proceed with coronary CTA to exclude this.  She will give [place] a BMP today.  She will take 100 mg metoprolol tartrate to before the scan.  We will schedule a 39-month follow-up based on results of that scan.  She will continue with Protonix and muscle relaxer for now.  I have also advised her to pursue stress  reduction strategies.     Disposition: Return in about 3 months (around 12/08/2020).  Medication Adjustments/Labs and Tests  Ordered: Current medicines are reviewed at length with the patient today.  Concerns regarding medicines are outlined above.  Orders Placed This Encounter  Procedures   CT CORONARY MORPH W/CTA COR W/SCORE W/CA W/CM &/OR WO/CM   Basic metabolic panel   EKG 35-TDDU    Meds ordered this encounter  Medications   metoprolol tartrate (LOPRESSOR) 100 MG tablet    Sig: Take 1 tablet by mouth once for procedure.    Dispense:  1 tablet    Refill:  0     Patient Instructions  Medication Instructions:  Take Metoprolol 100 mg two hours before the CT when scheduled.   *If you need a refill on your cardiac medications before your next appointment, please call your pharmacy*   Lab Work: BMET today  If you have labs (blood work) drawn today and your tests are completely normal, you will receive your results only by: [place] (if you have MyChart) OR A paper copy in the mail If you have any lab test that is abnormal or we need to change your treatment, we will call you to review the results.   Testing/Procedures: Your physician has requested that you have cardiac CT. Cardiac computed tomography (CT) is a painless test that uses an x-ray machine to take clear, detailed pictures of your heart. For further information please visit [email or web address].[email or web address]. Please follow instruction sheet as given.    Follow-Up: At [facility], you and your health needs are our priority.  As part of our continuing mission to provide you with exceptional heart care, we have created designated Provider Care Teams.  These Care Teams include your primary Cardiologist (physician) and Advanced Practice Providers (APPs -  Physician Assistants and Nurse Practitioners) who all work together to provide you with the care you need, when you need it.  We recommend signing up for the patient portal called "MyChart".  Sign up information is provided on this After Visit Summary.  MyChart is used to connect with patients for  Virtual Visits (Telemedicine).  Patients are able to view lab/test results, encounter notes, upcoming appointments, etc.  Non-urgent messages can be sent to your provider as well.   To learn more about what you can do with MyChart, go to [email or web address].    Your next appointment:   3 month(s)  The format for your next appointment:   In Person  Provider:   Eleonore Chiquito, MD   Other Instructions   Your cardiac CT will be scheduled at one of the below locations:   [facility] [address] [place], [place] [address] [phone]  [place] [address] [place], [place] [address] [phone]  If scheduled at [facility], please arrive at the [facility] main entrance (entrance A) of [facility] 30 minutes prior to test start time. Proceed to the [facility] Radiology Department (first floor) to check-in and test prep.  If scheduled at [facility], please arrive 15 mins early for check-in and test prep.  Please follow these instructions carefully (unless otherwise directed):  Hold all erectile dysfunction medications at least 3 days (72 hrs) prior to test.  On the Night Before the Test: Be sure to Drink plenty of water. Do not consume any caffeinated/decaffeinated beverages or chocolate 12 hours prior to your test. Do not take any antihistamines  12 hours prior to your test.  On the Day of the Test: Drink plenty of water until 1 hour prior to the test. Do not eat any food 4 hours prior to the test. You may take your regular medications prior to the test.  Take metoprolol (Lopressor) two hours prior to test. HOLD Furosemide/Hydrochlorothiazide morning of the test. FEMALES- please wear underwire-free bra if available       After the Test: Drink plenty of water. After receiving IV contrast, you may experience a mild flushed feeling. This is normal. On  occasion, you may experience a mild rash up to 24 hours after the test. This is not dangerous. If this occurs, you can take Benadryl 25 mg and increase your fluid intake. If you experience trouble breathing, this can be serious. If it is severe call 911 IMMEDIATELY. If it is mild, please call our office. If you take any of these medications: Glipizide/Metformin, Avandament, Glucavance, please do not take 48 hours after completing test unless otherwise instructed.   Once we have confirmed authorization from your insurance company, we will call you to set up a date and time for your test. Based on how quickly your insurance processes prior authorizations requests, please allow up to 4 weeks to be contacted for scheduling your Cardiac CT appointment. Be advised that routine Cardiac CT appointments could be scheduled as many as 8 weeks after your provider has ordered it.  For non-scheduling related questions, please contact the cardiac imaging nurse navigator should you have any questions/concerns: Marchia Bond, Cardiac Imaging Nurse Navigator Gordy Clement, Cardiac Imaging Nurse Navigator [facility] Direct Office Dial: [phone]   For scheduling needs, including cancellations and rescheduling, please call [place], [phone].    Time Spent with Patient: I have spent a total of 35 minutes with patient reviewing hospital notes, telemetry, EKGs, labs and examining the patient as well as establishing an assessment and plan that was discussed with the patient.  > 50% of time was spent in direct patient care.  Signed, Addison Naegeli. Audie Box, MD, [place]  [address], [place], [place] [address] [phone]  09/07/2020 11:04 AM

## 2020-09-12 ENCOUNTER — Ambulatory Visit: Payer: 59 | Admitting: Physician Assistant

## 2020-09-12 ENCOUNTER — Ambulatory Visit (INDEPENDENT_AMBULATORY_CARE_PROVIDER_SITE_OTHER): Payer: 59 | Admitting: Physician Assistant

## 2020-09-12 ENCOUNTER — Other Ambulatory Visit: Payer: Self-pay

## 2020-09-12 ENCOUNTER — Encounter: Payer: Self-pay | Admitting: Physician Assistant

## 2020-09-12 DIAGNOSIS — M7712 Lateral epicondylitis, left elbow: Secondary | ICD-10-CM | POA: Diagnosis not present

## 2020-09-12 NOTE — Progress Notes (Signed)
  HPI: Mrs. Slivka returns today for left elbow pain.  She is having pain and starts her elbow radiates up the arm down the arm.  No numbness tingling.  No new injury.  She is unsure how much the last injection helped.  She is taking ibuprofen.  Review of systems see HPI otherwise negative or noncontributory.  Physical exam: General well-developed well-nourished female no acute distress mood and affect appropriate  Upper extremities good range of motion bilateral shoulders without pain.  Negative impingement left shoulder.  Full range of motion left elbow.  Tenderness over the left lateral epicondyle only.  There is no rashes skin lesions ulcerations or ecchymosis about the elbow.  She has full supination pronation of the forearm.  Provocative maneuvers of the left wrist causes pain at the elbow.  Impression: Left elbow lateral epicondylitis  Plan: Discussed lifting techniques with her.  Offered formal therapy she defers.  Therefore recommend deep tissue massage with massage therapist.  Exercises given for the elbow.  She will begin using Voltaren gel 2 g 4 times daily to the elbow.  Questions encouraged and answered at length.  Follow-up with [place] as needed if pain persist or becomes worse.

## 2020-09-13 ENCOUNTER — Telehealth (HOSPITAL_COMMUNITY): Payer: Self-pay | Admitting: Emergency Medicine

## 2020-09-13 ENCOUNTER — Encounter (HOSPITAL_COMMUNITY): Payer: Self-pay

## 2020-09-13 NOTE — Telephone Encounter (Signed)
Attempted to call patient regarding upcoming cardiac CT appointment. °Left message on voicemail with name and callback number °Jasneet Schobert RN Navigator Cardiac Imaging °[facility] °[phone] Office °[phone] Cell ° °

## 2020-09-13 NOTE — Telephone Encounter (Signed)
Reaching out to patient to offer assistance regarding upcoming cardiac imaging study; pt verbalizes understanding of appt date/time, parking situation and where to check in, pre-test NPO status and medications ordered, and verified current allergies; name and call back number provided for further questions should they arise Marchia Bond RN Navigator Cardiac Imaging [facility] [phone] office [phone] cell  Denies claustro Reports small veins 100mg  metoprolol tart Holding lasix and benicar hcl

## 2020-09-17 ENCOUNTER — Ambulatory Visit (HOSPITAL_COMMUNITY)
Admission: RE | Admit: 2020-09-17 | Discharge: 2020-09-17 | Disposition: A | Discharge status: Home / Self Care | Payer: 59 | Source: Ambulatory Visit | Attending: Cardiovascular Disease | Admitting: Cardiovascular Disease

## 2020-09-17 ENCOUNTER — Other Ambulatory Visit: Payer: Self-pay | Admitting: *Deleted

## 2020-09-17 ENCOUNTER — Encounter (HOSPITAL_COMMUNITY): Payer: Self-pay

## 2020-09-17 DIAGNOSIS — R079 Chest pain, unspecified: Secondary | ICD-10-CM

## 2020-09-17 MED ORDER — METOPROLOL TARTRATE 5 MG/5ML IV SOLN
5.0000 mg | Freq: Once | INTRAVENOUS | Status: AC
  Administered 2020-09-17: 5 mg via INTRAVENOUS

## 2020-09-17 MED ORDER — METOPROLOL TARTRATE 5 MG/5ML IV SOLN
INTRAVENOUS | Status: AC
  Filled 2020-09-17: qty 5

## 2020-09-17 MED ORDER — NITROGLYCERIN 0.4 MG SL SUBL
0.8000 mg | SUBLINGUAL_TABLET | Freq: Once | SUBLINGUAL | Status: AC
  Administered 2020-09-17: 0.8 mg via SUBLINGUAL

## 2020-09-17 MED ORDER — FLUCONAZOLE 150 MG PO TABS: 150.0000 mg | ORAL_TABLET | Freq: Every day | ORAL | 2 refills | Status: DC

## 2020-09-17 MED ORDER — IOHEXOL 350 MG/ML SOLN
95.0000 mL | Freq: Once | INTRAVENOUS | Status: AC | PRN
  Administered 2020-09-17: 95 mL via INTRAVENOUS

## 2020-09-17 MED ORDER — NITROGLYCERIN 0.4 MG SL SUBL
SUBLINGUAL_TABLET | SUBLINGUAL | Status: AC
  Filled 2020-09-17: qty 2

## 2020-09-17 NOTE — Progress Notes (Signed)
Patient tolerated CT well. Vital signs stable encourage to drink water throughout day.Reasons explained and verbalized understanding. Ambulated steady gait.   

## 2020-09-27 ENCOUNTER — Ambulatory Visit: Payer: 59 | Admitting: Family Medicine

## 2020-09-27 ENCOUNTER — Other Ambulatory Visit: Payer: Self-pay

## 2020-09-27 ENCOUNTER — Encounter: Payer: Self-pay | Admitting: Family Medicine

## 2020-09-27 VITALS — BP 118/76 | HR 87 | Temp 97.9°F | Ht 59.0 in | Wt 161.8 lb

## 2020-09-27 DIAGNOSIS — E119 Type 2 diabetes mellitus without complications: Secondary | ICD-10-CM | POA: Diagnosis not present

## 2020-09-27 DIAGNOSIS — Z23 Encounter for immunization: Secondary | ICD-10-CM

## 2020-09-27 DIAGNOSIS — E785 Hyperlipidemia, unspecified: Secondary | ICD-10-CM | POA: Diagnosis not present

## 2020-09-27 DIAGNOSIS — R0789 Other chest pain: Secondary | ICD-10-CM

## 2020-09-27 DIAGNOSIS — F418 Other specified anxiety disorders: Secondary | ICD-10-CM | POA: Diagnosis not present

## 2020-09-27 DIAGNOSIS — E041 Nontoxic single thyroid nodule: Secondary | ICD-10-CM | POA: Insufficient documentation

## 2020-09-27 DIAGNOSIS — E66811 Obesity, class 1: Secondary | ICD-10-CM

## 2020-09-27 DIAGNOSIS — I10 Essential (primary) hypertension: Secondary | ICD-10-CM | POA: Diagnosis not present

## 2020-09-27 DIAGNOSIS — G4700 Insomnia, unspecified: Secondary | ICD-10-CM | POA: Insufficient documentation

## 2020-09-27 DIAGNOSIS — E669 Obesity, unspecified: Secondary | ICD-10-CM

## 2020-09-27 DIAGNOSIS — G43909 Migraine, unspecified, not intractable, without status migrainosus: Secondary | ICD-10-CM | POA: Insufficient documentation

## 2020-09-27 DIAGNOSIS — F5104 Psychophysiologic insomnia: Secondary | ICD-10-CM

## 2020-09-27 MED ORDER — PAROXETINE HCL 20 MG PO TABS
ORAL_TABLET | ORAL | 1 refills | Status: DC
Start: 2020-09-27 — End: 2021-01-17

## 2020-09-27 MED ORDER — CLONAZEPAM 0.5 MG PO TABS: 0.5000 mg | ORAL_TABLET | Freq: Two times a day (BID) | ORAL | 1 refills | Status: DC | PRN

## 2020-09-27 NOTE — Progress Notes (Signed)
[facility] [address] [place] [address] Dept: [phone] Dept Fax: [phone]  Transfer of Care Office Visit  Subjective:    Patient ID: [name], female    DOB: April 01, 1965, 55 y.o..   MRN: [id number]  Chief Complaint  Patient presents with   Establish Care    NP- establish care.  C/o still having chest discomfort.     History of Present Illness:  Patient is in today to establish care. [name] is originally from [place]. Although she lived for a time in [place], [place] as a child, she has lived in [place] most of her life. She is married and has a 76 year-old daughter. She attended [employer] and has a degree in business administration with a focus on HR. 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 [name] denies tobacco or drug use. She rarely drinks alcohol.  [name] has a history of hypertension, primarily managed on Benicar (olmesartan/HCTZ). She had been placed on Lasix at one point, but it appears that this what not related to blood pressure control. She also notes that she hasn't been taking this more recently. Additionally, at some point, she was given a potassium supplement, but notes her potassium on recent labs was normal.   [name] has a history of Type 2 diabetes. She is managed on Januvia and metformin. She notes she has been able to lose weight in the past year. Her HbA1c is doing well. She would like to get off of the metformin if possible.  [name] has a history of hyperlipidemia and is managed on Crestor.  [name] has a history of migraine headaches. These only occur occasionally. She currently use Maxalt episodically for treatment of these and this medicaiton works well.  [name] a number of orthopedic complaints, including low back pain with sciatica, right hip pain, shoulder pain and left arm pain. She has been  seeing a PA with [facility]. She was referred for deep massage to relive muscle tension that seems to be part of her pain issues.  [name] has a history of atypical chest pain. This apparently has been episodic, but really became an issue after [name] was present for the death of her mother. She notes that she does have some depression and at times feels acute anxiety related to this. She is being treated with Klonipin for her anxiety. She is not in counseling. She has some associated insomnia. She notes she is on Seroquel for management of the insomnia. [name] has had extensive work-up with cardiology for the chestpain and it has been determined her pain is not of a cardiac source. She was put on Protonix, but this has not improved her pain. She does note some tenderness with palpation to the left anterior chest wall and reprots spasms up into the left shoulder.  Past Medical History: Patient Active Problem List   Diagnosis Date Noted   Depression with anxiety 09/27/2020   Migraine headache 09/27/2020   Insomnia 09/27/2020   Class 2 severe obesity due to excess calories with serious comorbidity and body mass index (BMI) of 39.0 to 39.9 in adult [facility]) 03/09/2019   Acute right-sided low back pain with right-sided sciatica 06/10/2017   Trochanteric bursitis, right hip 05/27/2017   Atypical chest pain 11/18/2012   DM (diabetes mellitus), type 2 ([facility]) 11/18/2012   Essential hypertension 11/18/2012   Hyperlipidemia    Past Surgical History:  Procedure  Laterality Date   COMBINED HYSTEROSCOPY DIAGNOSTIC / D&C  yrs ago   [place]   with laser adhesions   DILITATION & CURRETTAGE/HYSTROSCOPY WITH NOVASURE ABLATION N/A 11/04/2019   Procedure: DILATATION & CURETTAGE/HYSTEROSCOPY WITH NOVASURE ABLATION;  Surgeon: Joseph Pierini, MD;  Location: [facility];  Service: Gynecology;  Laterality: N/A;   INTRAUTERINE DEVICE INSERTION     mirena-Inserted  05-16-14   KNEE SURGERY Left yrs ago   meniscurs tear repair   mirena removed  2021   OOPHORECTOMY  2008   left   ROTATOR CUFF REPAIR Right yrs ago   WRIST SURGERY Right    gang. cyst   Family History  Problem Relation Age of Onset   Hypertension Mother    Diabetes Mother    COPD Mother    Cancer Mother        Lung   Hypertension Father    Stroke Father 65   Diabetes Sister    Hypertension Brother    Diabetes Maternal Aunt    Cancer Maternal Aunt        Kidney   Kidney disease Maternal Aunt    Diabetes Maternal Aunt    Stroke Maternal Grandfather    Diabetes Maternal Grandfather    Colon cancer Neg Hx    Esophageal cancer Neg Hx    Rectal cancer Neg Hx    Stomach cancer Neg Hx    Outpatient Medications Prior to Visit  Medication Sig Dispense Refill   acetaminophen (TYLENOL) 500 MG tablet Take 2 tablets (1,000 mg total) by mouth every 6 (six) hours as needed for mild pain.     Ascorbic Acid (VITAMIN C) 1000 MG tablet Take 1,000 mg by mouth daily.     cholecalciferol (VITAMIN D3) 25 MCG (1000 UNIT) tablet Take 1,000 Units by mouth daily.     diclofenac sodium (VOLTAREN) 1 % GEL APPLY 4 GRAMS TOPICALLY 4 (FOUR) TIMES DAILY. RIGHT POSTERIOR TIBIAL TENDON (Patient taking differently: To left elbow) 900 g 0   JANUVIA 100 MG tablet TAKE 1 TABLET BY MOUTH DAILY AFTER BREAKFAST. 90 tablet 1   metFORMIN (GLUCOPHAGE) 500 MG tablet TAKE 1 TABLET BY MOUTH EVERY DAY WITH BREAKFAST 90 tablet 1   olmesartan-hydrochlorothiazide (BENICAR HCT) 40-12.5 MG tablet Take 1 tablet by mouth daily. 90 tablet 3   rizatriptan (MAXALT-MLT) 10 MG disintegrating tablet TAKE 1 TABLET BY MOUTH AS NEEDED FOR MIGRAINE. MAY REPEAT IN 2 HOURS IF NEEDED 10 tablet 6   rosuvastatin (CRESTOR) 10 MG tablet Take 1 tablet (10 mg total) by mouth daily after breakfast. 90 tablet 3   tiZANidine (ZANAFLEX) 4 MG tablet Take 1 tablet (4 mg total) by mouth at bedtime. 30 tablet 0   zinc gluconate 50 MG tablet Take 50 mg by  mouth daily.     clonazePAM (KLONOPIN) 0.5 MG tablet Take 1 tablet (0.5 mg total) by mouth 2 (two) times daily as needed for anxiety. 30 tablet 1   furosemide (LASIX) 20 MG tablet Take 1 tablet (20 mg total) by mouth 2 (two) times daily as needed (fluid). 30 tablet 3   pantoprazole (PROTONIX) 40 MG tablet Take 1 tablet (40 mg total) by mouth daily. 30 tablet 3   Potassium 75 MG TABS Take 75 mg by mouth daily.      fluconazole (DIFLUCAN) 150 MG tablet Take 1 tablet (150 mg total) by mouth daily. (Patient not taking: Reported on 09/27/2020) 3 tablet 2   metoprolol tartrate (LOPRESSOR) 100 MG  tablet Take 1 tablet by mouth once for procedure. 1 tablet 0   promethazine (PHENERGAN) 25 MG tablet  (Patient not taking: Reported on 09/27/2020)  1   QUEtiapine (SEROQUEL) 25 MG tablet Take 0.5 tablets (12.5 mg total) by mouth at bedtime. (Patient not taking: Reported on 09/27/2020) 45 tablet 1   traMADol (ULTRAM) 50 MG tablet Take 1-2 tablets (50-100 mg total) by mouth every 6 (six) hours as needed for severe pain. (Patient not taking: Reported on 09/27/2020) 10 tablet 0   No facility-administered medications prior to visit.   Allergies  Allergen Reactions   Dilaudid [Hydromorphone Hcl] Other (See Comments)    Broke in sweat and started shaking, can take oral   Sulfa Antibiotics Hives   Tylox [Oxycodone-Acetaminophen] Nausea And Vomiting   Clindamycin/Lincomycin Rash    hives     Objective:   Today's Vitals   09/27/20 1043  BP: 118/76  Pulse: 87  Temp: 97.9 F (36.6 C)  TempSrc: Temporal  SpO2: 95%  Weight: 161 lb 12.8 oz (73.4 kg)  Height: '4\' 11"'$  (1.499 m)   Body mass index is 32.68 kg/m.   General: Well developed, well nourished. No acute distress. Chest: Mild tenderness to palpation over the left upper chest wall. Psych: Alert and oriented. Mildly depressed mood and affect with tearfulness as she recounts the death of her mother.  Health Maintenance Due  Topic Date Due   Zoster  Vaccines- Shingrix (1 of 2) Never done   HEMOGLOBIN A1C  12/08/2019   FOOT EXAM  12/22/2019   OPHTHALMOLOGY EXAM  02/26/2020   TETANUS/TDAP  03/21/2020   COVID-19 Vaccine (4 - Booster for Pfizer series) 05/12/2020   Lab Results Patient shared labs from 07/07/2020 performed at a mobile clinic HbA1c= 6.5% TSH= 1.4 uU/mL  Total cholesterol = 146 mg/dL Triglycerides= 71  mg/dL HDL = 65 mg/dL LDL (Calc)= 67 mg/dL  Glucose= 86 mg/dL Rest of CMP was normal except for mildly elevated chloride and calcium.  Lab Results  Component Value Date   TSH 2.48 05/21/2020   FNA of Thyroid nodule (06/05/2020) FINAL MICROSCOPIC DIAGNOSIS:  - Consistent with benign follicular nodule (Bethesda category II)   Imaging: CT Coronary Morph w/CTA IMPRESSION: 1. Coronary calcium score of 0. 2. Normal coronary origin with right dominance. 3. No evidence of CAD.   CAD-RADS 0. No evidence of CAD (0%). Consider non-atherosclerotic causes of chest pain.  Thyroid US (05/09/2019)  IMPRESSION: Nodule 1 (TI-RADS 4) located in the mid left thyroid lobe measuring 1.5 x 1.0 x 0.7 cm meets criteria for FNA.   The above is in keeping with the ACR TI-RADS recommendations - J Am Coll Radiol 2017;14:587-595.  Echocardiogram (03/27/2020) IMPRESSIONS    1. Left ventricular ejection fraction, by estimation, is 65 to 70%. The left ventricle has normal function. The left ventricle has no regional  wall motion abnormalities. Left ventricular diastolic parameters were  normal.   2. Right ventricular systolic function is normal. The right ventricular size is normal.   3. The mitral valve is normal in structure. No evidence of mitral valve regurgitation. No evidence of mitral stenosis.   4. The aortic valve was not well visualized. Aortic valve regurgitation is not visualized. No aortic stenosis is present.   5. The inferior vena cava is normal in size with greater than 50% respiratory variability, suggesting right atrial  pressure of 3 mmHg.   Assessment & Plan:   1. Depression with anxiety Ms. Aldea appears to be struggling with  prolonged grief vs. depression with features of anxiety. I am willing to continue episodic Klonipin. However, I feel she should be on an SSRI to treat the underlying issue. I will start her on Paxil. I will plan to follow-up with her in 6-8 weeks.  - clonazePAM (KLONOPIN) 0.5 MG tablet; Take 1 tablet (0.5 mg total) by mouth 2 (two) times daily as needed for anxiety.  Dispense: 30 tablet; Refill: 1 - PARoxetine (PAXIL) 20 MG tablet; Take 0.5 tablets (10 mg total) by mouth daily for 30 days, THEN 1 tablet (20 mg total) daily.  Dispense: 45 tablet; Refill: 1 - Ambulatory referral to Psychology  2. Essential hypertension Blood pressure is at goal. Will continue Benicar. I see no need for Lasix or the potassium supplement, so will have her stop these.  3. Type 2 diabetes mellitus without complication, without long-term current use of insulin (HCC) Diabetes is in good control on Januvia and metformin. I am willing to give her a trial off of the metformin. We will check her HbA1c in 2 months.  4. Hyperlipidemia, unspecified hyperlipidemia type Lipids were at goal in May. Continue Crestor.  5. Atypical chest pain Ms. Valenzano has had extensive evaluation of her chest pain. There is no evidence of this being of cardiac origin. I suspect she may have chest wall muscle tenderness and spasm that is being magnified by poorly managed depression/anxiety. She can continue to use PRN Zanaflex. Recommend she look into massage to help relieve discomfort.  6. Obesity (BMI 30.0-34.9) Ms. Asti weigh this down 34 lbs in the last 7 months. Her BMI is now in the Class I range.  We will continue to follow her weight loss efforts.  7. Psychophysiological insomnia Ms. Crossett believes she was prescribed Seroquel for sleep. I am not aware of this being an indication for sleep management, but neither is  this first-line therapy fo anxiety or depression. I recommend she stop the Seroquel. She can use her Klonipin at bedtime if need ed for sleep.  8. Need for Td vaccine  - Td : Tetanus/diphtheria >7yo Preservative  free  Haydee Salter, MD

## 2020-09-27 NOTE — Patient Instructions (Signed)
Stop pantoprazole (Protonix) Stop potassium Stop quetiapine (Seroquel) Stop furosemide (Lasix) Hold metformin. We will recheck your hemoglobin A1c at your next visit.

## 2020-10-15 ENCOUNTER — Telehealth: Payer: Self-pay

## 2020-10-15 NOTE — Telephone Encounter (Signed)
Patient calls with concerns regarding her left breast and some pain she has been having. Would like to come for breast exam. Message sent to appointment desk pool to contact her and schedule visit with Dr. Marguerita Merles.

## 2020-10-19 ENCOUNTER — Encounter: Payer: Self-pay | Admitting: Obstetrics & Gynecology

## 2020-10-19 ENCOUNTER — Ambulatory Visit: Payer: 59 | Admitting: Obstetrics & Gynecology

## 2020-10-19 ENCOUNTER — Other Ambulatory Visit: Payer: Self-pay

## 2020-10-19 ENCOUNTER — Telehealth: Payer: Self-pay | Admitting: *Deleted

## 2020-10-19 VITALS — BP 114/64

## 2020-10-19 DIAGNOSIS — N644 Mastodynia: Secondary | ICD-10-CM

## 2020-10-19 DIAGNOSIS — Z780 Asymptomatic menopausal state: Secondary | ICD-10-CM | POA: Diagnosis not present

## 2020-10-19 DIAGNOSIS — N63 Unspecified lump in unspecified breast: Secondary | ICD-10-CM

## 2020-10-19 NOTE — Telephone Encounter (Signed)
-----   Message from Princess Bruins, MD sent at 10/19/2020  2:27 PM EDT ----- Regarding: Schedule Left Dx mammo/Breast US at the [facility] Left mobile, tender area of increased density at 3 o'clock measuring 1.5 x 2.5 cm.

## 2020-10-19 NOTE — Progress Notes (Signed)
    [name] 06-01-1965 [id number]        55 y.o.  G2P1011   RP: Left breast tenderness and pain   HPI: Left breast tenderness and pain.  Known lump felt.  No nipple discharge.  No change in skin.  No fever.  Last mammogram November 2021 was negative.  No first-degree relative with breast cancer.   OB History  Gravida Para Term Preterm AB Living  '2 1 1   1 1  '$ SAB IAB Ectopic Multiple Live Births               # Outcome Date GA Lbr Len/2nd Weight Sex Delivery Anes PTL Lv  2 AB           1 Term             Past medical history,surgical history, problem list, medications, allergies, family history and social history were all reviewed and documented in the EPIC chart.   Directed ROS with pertinent positives and negatives documented in the history of present illness/assessment and plan.  Exam:  Vitals:   10/19/20 1350  BP: 114/64   General appearance:  Normal  Breast exam: Left mobile, tender area of increased density at 3 o'clock measuring 1.5 x 2.5 cm.   Skin normal.  No axillary LN felt on the left.                       Rt breast normal. No axillary LN felt on the right.   Assessment/Plan:  55 y.o. G2P1011   1. Breast pain, left Left mobile, tender area of increased density at 3 o'clock measuring 1.5 x 2.5 cm.   Schedule a Left Dx mammo/Breast US.    2. Postmenopause  Not on HRT.  Princess Bruins MD, 2:19 PM 10/19/2020

## 2020-10-21 ENCOUNTER — Encounter: Payer: Self-pay | Admitting: Obstetrics & Gynecology

## 2020-10-22 ENCOUNTER — Telehealth: Payer: Self-pay

## 2020-10-22 NOTE — Telephone Encounter (Signed)
Patient called because [facility] cannot schedule her diagnostic [facility] until the end of September. She did not schedule. [facility] told her to check with Dr. Marguerita Merles to see if she wanted to try to schedule her somewhere else to see if she can get sooner visit. Patient would like sooner visit.  [place] for different facility (not sure that will be any sooner)?

## 2020-10-24 NOTE — Telephone Encounter (Signed)
Patient scheduled on 11/21/20 at the breast center.

## 2020-10-25 NOTE — Telephone Encounter (Signed)
I spoke with patient and confirmed she would like to take Monday Aug 29, 10:30am appt at [facility].  Info provided. Order faxed and [facility] appt cancelled. Info sent to patient through My Chart as well.

## 2020-10-25 NOTE — Telephone Encounter (Signed)
Yes, can try to schedule with other facility.

## 2020-11-03 ENCOUNTER — Other Ambulatory Visit: Payer: Self-pay | Admitting: Family Medicine

## 2020-11-06 NOTE — Telephone Encounter (Signed)
LR 03/09/19, #90, 1 rf (Dr Nolon Rod) [facility]  09/27/20 (dr Gena Fray) [facility]  11/29/20  (Dr Gena Fray)

## 2020-11-16 ENCOUNTER — Ambulatory Visit (INDEPENDENT_AMBULATORY_CARE_PROVIDER_SITE_OTHER): Payer: 59 | Admitting: Psychology

## 2020-11-16 ENCOUNTER — Other Ambulatory Visit: Payer: 59

## 2020-11-16 DIAGNOSIS — F4321 Adjustment disorder with depressed mood: Secondary | ICD-10-CM

## 2020-11-21 ENCOUNTER — Other Ambulatory Visit: Payer: 59

## 2020-11-26 ENCOUNTER — Other Ambulatory Visit: Payer: Self-pay

## 2020-11-26 ENCOUNTER — Other Ambulatory Visit (INDEPENDENT_AMBULATORY_CARE_PROVIDER_SITE_OTHER): Payer: 59

## 2020-11-26 ENCOUNTER — Ambulatory Visit (INDEPENDENT_AMBULATORY_CARE_PROVIDER_SITE_OTHER): Payer: 59 | Admitting: Internal Medicine

## 2020-11-26 VITALS — BP 116/74 | HR 74 | Ht 59.0 in | Wt 157.3 lb

## 2020-11-26 DIAGNOSIS — E041 Nontoxic single thyroid nodule: Secondary | ICD-10-CM

## 2020-11-26 LAB — T4, FREE: Free T4: 0.8 ng/dL (ref 0.60–1.60)

## 2020-11-26 LAB — TSH: TSH: 1.61 u[IU]/mL (ref 0.35–5.50)

## 2020-11-26 LAB — T3, FREE: T3, Free: 2.9 pg/mL (ref 2.3–4.2)

## 2020-11-26 NOTE — Progress Notes (Signed)
Name: [name]  MRN/ DOB: [id number], 02/13/1966    Age/ Sex: 55 y.o., female     PCP: Haydee Salter, MD   Reason for Endocrinology Evaluation: Left thyroid nodule      Initial Endocrinology Clinic Visit: 05/21/2020    PATIENT IDENTIFIER: [name] is a 55 y.o., female with a past medical history of  T2DM and dyslipidemia. She has followed with [facility] since 05/21/2020 for consultative assistance with management of her left thyroid nodule .   HISTORICAL SUMMARY: She was diagnosed with left thyroid nodule on ultrasound 05/2020. She is S/P FNA of the left mid nodule 1.5 cm on 06/05/2020 with benign cytology  She was having anxiety and palpitations around 2020 when her mother passed.    NO FH of thyroid disease   SUBJECTIVE:    Today (11/26/2020):  [name] is here for a follow up on left thyroid nodule.   Denies local neck swelling, denies pain  Denies dysphagia  Anxiety has improved  Denies palpitations  Denies loose stools or diarrhea  Has intentional weight loss     She is S/P benign FNA 4/5th ,2022  HISTORY:  Past Medical History:  Past Medical History:  Diagnosis Date   Abnormal uterine bleeding (AUB)    Arthritis    Diabetes mellitus    Elevated cholesterol    Endometrial polyp    Endometrial polyp    Endometriosis    Hypertension    Migraines    Nodule    Ovarian cyst    Past Surgical History:  Past Surgical History:  Procedure Laterality Date   COMBINED HYSTEROSCOPY DIAGNOSTIC / D&C  yrs ago   [place]   with laser adhesions   DILITATION & CURRETTAGE/HYSTROSCOPY WITH NOVASURE ABLATION N/A 11/04/2019   Procedure: DILATATION & CURETTAGE/HYSTEROSCOPY WITH NOVASURE ABLATION;  Surgeon: Joseph Pierini, MD;  Location: [facility];  Service: Gynecology;  Laterality: N/A;   INTRAUTERINE DEVICE INSERTION     mirena-Inserted 05-16-14   KNEE SURGERY Left yrs ago    meniscurs tear repair   mirena removed  2021   OOPHORECTOMY  2008   left   ROTATOR CUFF REPAIR Right yrs ago   WRIST SURGERY Right    gang. cyst   Social History:  reports that she has never smoked. She has never used smokeless tobacco. She reports that she does not currently use alcohol. She reports that she does not use drugs. Family History:  Family History  Problem Relation Age of Onset   Hypertension Mother    Diabetes Mother    COPD Mother    Cancer Mother        Lung   Hypertension Father    Stroke Father 58   Diabetes Sister    Hypertension Brother    Diabetes Maternal Aunt    Cancer Maternal Aunt        Kidney   Kidney disease Maternal Aunt    Diabetes Maternal Aunt    Stroke Maternal Grandfather    Diabetes Maternal Grandfather    Colon cancer Neg Hx    Esophageal cancer Neg Hx    Rectal cancer Neg Hx    Stomach cancer Neg Hx      HOME MEDICATIONS: Allergies as of 11/26/2020       Reactions   Dilaudid [hydromorphone Hcl] Other (See Comments)   Broke in sweat and started shaking, can take oral   Sulfa Antibiotics Hives  Tylox [oxycodone-acetaminophen] Nausea And Vomiting   Clindamycin/lincomycin Rash   hives        Medication List        Accurate as of November 26, 2020  7:43 AM. If you have any questions, ask your nurse or doctor.          acetaminophen 500 MG tablet Commonly known as: TYLENOL Take 2 tablets (1,000 mg total) by mouth every 6 (six) hours as needed for mild pain.   cholecalciferol 25 MCG (1000 UNIT) tablet Commonly known as: VITAMIN D3 Take 1,000 Units by mouth daily.   clonazePAM 0.5 MG tablet Commonly known as: KLONOPIN Take 1 tablet (0.5 mg total) by mouth 2 (two) times daily as needed for anxiety.   diclofenac sodium 1 % Gel Commonly known as: VOLTAREN APPLY 4 GRAMS TOPICALLY 4 (FOUR) TIMES DAILY. RIGHT POSTERIOR TIBIAL TENDON What changed: See the new instructions.   Januvia 100 MG tablet Generic drug:  sitaGLIPtin TAKE 1 TABLET BY MOUTH DAILY AFTER BREAKFAST.   metFORMIN 500 MG tablet Commonly known as: GLUCOPHAGE TAKE 1 TABLET BY MOUTH EVERY DAY WITH BREAKFAST   olmesartan-hydrochlorothiazide 40-12.5 MG tablet Commonly known as: BENICAR HCT Take 1 tablet by mouth daily.   PARoxetine 20 MG tablet Commonly known as: Paxil Take 0.5 tablets (10 mg total) by mouth daily for 30 days, THEN 1 tablet (20 mg total) daily. Start taking on: September 27, 2020   rizatriptan 10 MG disintegrating tablet Commonly known as: MAXALT-MLT TAKE 1 TABLET BY MOUTH AS NEEDED FOR MIGRAINE. MAY REPEAT IN 2 HOURS IF NEEDED   rosuvastatin 10 MG tablet Commonly known as: CRESTOR Take 1 tablet (10 mg total) by mouth daily after breakfast.   tiZANidine 4 MG tablet Commonly known as: Zanaflex Take 1 tablet (4 mg total) by mouth at bedtime.   vitamin C 1000 MG tablet Take 1,000 mg by mouth daily.   zinc gluconate 50 MG tablet Take 50 mg by mouth daily.          OBJECTIVE:   PHYSICAL EXAM: VS: BP 116/74 (BP Location: Left Arm, Patient Position: Sitting, Cuff Size: Small)   Pulse 74   Ht 4\' 11"  (1.499 m)   Wt 157 lb 4.8 oz (71.4 kg)   LMP  (LMP Unknown)   SpO2 96%   BMI 31.77 kg/m    EXAM: General: Pt appears well and is in NAD  Neck: General: Supple without adenopathy. Thyroid: Thyroid size normal. Left isthmic nodule palpated.   Heart: RRR  Chest : CTA  Abdomen: Soft, non tender   Mental Status: Judgment, insight: Intact Memory: Intact for recent and remote events Mood and affect: No depression, anxiety, or agitation     DATA REVIEWED: Results for SHATIMA, ZALAR (MRN [id number]) as of 11/26/2020 11:57  Ref. Range 11/26/2020 08:03  TSH Latest Ref Range: 0.35 - 5.50 uIU/mL 1.61  Triiodothyronine,Free,Serum Latest Ref Range: 2.3 - 4.2 pg/mL 2.9  T4,Free(Direct) Latest Ref Range: 0.60 - 1.60 ng/dL 0.80      FNA 06/05/2020  FINAL MICROSCOPIC DIAGNOSIS:  - Consistent with  benign follicular nodule (Bethesda category II)   ASSESSMENT / PLAN / RECOMMENDATIONS:   Left thyroid Nodule :  -No Local neck symptoms  - Pt is clinically and biochemically euthyroid  - S/P Benign FNA in 06/2020 - Will repeat thyroid ultrasound 05/2021  F/U in 1 yr   Signed electronically by: Mack Guise, MD  [facility] Endocrinology  [address]  [place], [place] [address] Phone: [phone] FAX: [phone]      CC: Haydee Salter, [place] [address] Phone: [phone]  Fax: [phone]   Return to Endocrinology clinic as below: Future Appointments  Date Time Provider [place]  11/29/2020  8:30 AM Haydee Salter, MD [id number]  12/07/2020  1:00 PM [place], Reed [place], [id number] None

## 2020-11-29 ENCOUNTER — Other Ambulatory Visit: Payer: Self-pay

## 2020-11-29 ENCOUNTER — Ambulatory Visit (INDEPENDENT_AMBULATORY_CARE_PROVIDER_SITE_OTHER): Payer: 59 | Admitting: Family Medicine

## 2020-11-29 VITALS — BP 122/80 | HR 69 | Temp 97.1°F | Ht 59.0 in | Wt 156.8 lb

## 2020-11-29 DIAGNOSIS — Z23 Encounter for immunization: Secondary | ICD-10-CM

## 2020-11-29 DIAGNOSIS — E119 Type 2 diabetes mellitus without complications: Secondary | ICD-10-CM

## 2020-11-29 DIAGNOSIS — E785 Hyperlipidemia, unspecified: Secondary | ICD-10-CM

## 2020-11-29 DIAGNOSIS — I10 Essential (primary) hypertension: Secondary | ICD-10-CM | POA: Diagnosis not present

## 2020-11-29 DIAGNOSIS — E669 Obesity, unspecified: Secondary | ICD-10-CM | POA: Diagnosis not present

## 2020-11-29 DIAGNOSIS — E66811 Obesity, class 1: Secondary | ICD-10-CM

## 2020-11-29 DIAGNOSIS — F418 Other specified anxiety disorders: Secondary | ICD-10-CM

## 2020-11-29 LAB — HEMOGLOBIN A1C: Hgb A1c MFr Bld: 6.5 % (ref 4.6–6.5)

## 2020-11-29 LAB — GLUCOSE, RANDOM: Glucose, Bld: 91 mg/dL (ref 70–99)

## 2020-11-29 MED ORDER — CLONAZEPAM 0.5 MG PO TABS: 0.5000 mg | ORAL_TABLET | Freq: Two times a day (BID) | ORAL | 1 refills | Status: DC | PRN

## 2020-11-29 NOTE — Progress Notes (Signed)
[facility] [address] [address] Dept: [phone] Dept Fax: [phone]  Chronic Care Office Visit  Subjective:    Patient ID: [name], female    DOB: 03-16-1965, 55 y.o..   MRN: [id number]  Chief Complaint  Patient presents with   Follow-up    2 month f/u, wants flu shot today.     History of Present Illness:  Patient is in today for reassessment of chronic medical issues.  [name] has a history of hypertension, managed on Benicar (olmesartan/HCTZ).   [name] has a history of Type 2 diabetes. She is managed on Januvia. We stopped her metformin at her last appointment. Her home blood sugars have been 85-115. [name] continues to work at weight loss. She is down ~ 45 lbs. in the past 3 years.   [name] has a history of hyperlipidemia and is managed on Crestor.   [name] has a history of depression with anxiety. Some of this was related to unresolved issues around her mother's death. She was being treated with Kloniopin, but was not well controlled with this. At her last appointment, I started [name] on Paxil. She feels she is doing much better at this point.   Past Medical History: Patient Active Problem List   Diagnosis Date Noted   Depression with anxiety 09/27/2020   Migraine headache 09/27/2020   Insomnia 09/27/2020   Thyroid nodule 09/27/2020   Obesity (BMI 30.0-34.9) 03/09/2019   Acute right-sided low back pain with right-sided sciatica 06/10/2017   Trochanteric bursitis, right hip 05/27/2017   Atypical chest pain 11/18/2012   DM (diabetes mellitus), type 2 ([place]) 11/18/2012   Essential hypertension 11/18/2012   Hyperlipidemia    Past Surgical History:  Procedure Laterality Date   COMBINED HYSTEROSCOPY DIAGNOSTIC / D&C  yrs ago   [place]   with laser adhesions   DILITATION & CURRETTAGE/HYSTROSCOPY WITH NOVASURE ABLATION N/A  11/04/2019   Procedure: DILATATION & CURETTAGE/HYSTEROSCOPY WITH NOVASURE ABLATION;  Surgeon: Joseph Pierini, MD;  Location: [facility];  Service: Gynecology;  Laterality: N/A;   INTRAUTERINE DEVICE INSERTION     mirena-Inserted 05-16-14   KNEE SURGERY Left yrs ago   meniscurs tear repair   mirena removed  2021   OOPHORECTOMY  2008   left   ROTATOR CUFF REPAIR Right yrs ago   WRIST SURGERY Right    gang. cyst   Family History  Problem Relation Age of Onset   Hypertension Mother    Diabetes Mother    COPD Mother    Cancer Mother        Lung   Hypertension Father    Stroke Father 29   Diabetes Sister    Hypertension Brother    Diabetes Maternal Aunt    Cancer Maternal Aunt        Kidney   Kidney disease Maternal Aunt    Diabetes Maternal Aunt    Stroke Maternal Grandfather    Diabetes Maternal Grandfather    Colon cancer Neg Hx    Esophageal cancer Neg Hx    Rectal cancer Neg Hx    Stomach cancer Neg Hx    Outpatient Medications Prior to Visit  Medication Sig Dispense Refill   acetaminophen (TYLENOL) 500 MG tablet Take 2 tablets (1,000 mg total) by mouth every 6 (six) hours as needed for mild pain.     Ascorbic Acid (VITAMIN C) 1000 MG tablet Take 1,000 mg  by mouth daily.     cholecalciferol (VITAMIN D3) 25 MCG (1000 UNIT) tablet Take 1,000 Units by mouth daily.     diclofenac sodium (VOLTAREN) 1 % GEL APPLY 4 GRAMS TOPICALLY 4 (FOUR) TIMES DAILY. RIGHT POSTERIOR TIBIAL TENDON 900 g 0   olmesartan-hydrochlorothiazide (BENICAR HCT) 40-12.5 MG tablet Take 1 tablet by mouth daily. 90 tablet 3   PARoxetine (PAXIL) 20 MG tablet Take 0.5 tablets (10 mg total) by mouth daily for 30 days, THEN 1 tablet (20 mg total) daily. 45 tablet 1   rizatriptan (MAXALT-MLT) 10 MG disintegrating tablet TAKE 1 TABLET BY MOUTH AS NEEDED FOR MIGRAINE. MAY REPEAT IN 2 HOURS IF NEEDED 10 tablet 6   rosuvastatin (CRESTOR) 10 MG tablet Take 1 tablet (10 mg total) by mouth daily after  breakfast. 90 tablet 3   sitaGLIPtin (JANUVIA) 100 MG tablet TAKE 1 TABLET BY MOUTH DAILY AFTER BREAKFAST. 90 tablet 0   tiZANidine (ZANAFLEX) 4 MG tablet Take 1 tablet (4 mg total) by mouth at bedtime. 30 tablet 0   zinc gluconate 50 MG tablet Take 50 mg by mouth daily.     clonazePAM (KLONOPIN) 0.5 MG tablet Take 1 tablet (0.5 mg total) by mouth 2 (two) times daily as needed for anxiety. 30 tablet 1   metFORMIN (GLUCOPHAGE) 500 MG tablet TAKE 1 TABLET BY MOUTH EVERY DAY WITH BREAKFAST (Patient not taking: No sig reported) 90 tablet 1   No facility-administered medications prior to visit.   Allergies  Allergen Reactions   Dilaudid [Hydromorphone Hcl] Other (See Comments)    Broke in sweat and started shaking, can take oral   Sulfa Antibiotics Hives   Tylox [Oxycodone-Acetaminophen] Nausea And Vomiting   Clindamycin/Lincomycin Rash    hives    Objective:   Today's Vitals   11/29/20 0842  BP: 122/80  Pulse: 69  Temp: (!) 97.1 F (36.2 C)  TempSrc: Temporal  SpO2: 98%  Weight: 156 lb 12.8 oz (71.1 kg)  Height: 4\' 11"  (1.499 m)   Body mass index is 31.67 kg/m.   General: Well developed, well nourished. No acute distress. Psych: Alert and oriented. Normal mood and affect.  Health Maintenance Due  Topic Date Due   Zoster Vaccines- Shingrix (1 of 2) Never done   FOOT EXAM  12/22/2019   COVID-19 Vaccine (4 - Booster for Pfizer series) 05/12/2020   INFLUENZA VACCINE  10/01/2020   Lab Results Lab Results  Component Value Date   HGBA1C 7.5 (A) 06/08/2019   Lab Results  Component Value Date   CHOL 124 12/22/2018   HDL 58 12/22/2018   LDLCALC 49 12/22/2018   TRIG 92 12/22/2018   CHOLHDL 2.1 12/22/2018   Depression screen PHQ 2/9 11/29/2020 04/26/2020 06/08/2019  Decreased Interest 0 1 0  Down, Depressed, Hopeless 0 1 0  PHQ - 2 Score 0 2 0  Altered sleeping 0 - -  Tired, decreased energy 0 - -  Change in appetite 0 - -  Feeling bad or failure about yourself  0 - -   Trouble concentrating 0 - -  Moving slowly or fidgety/restless 0 - -  Suicidal thoughts - - -  PHQ-9 Score 0 - -  Difficult doing work/chores Not difficult at all - -  Some recent data might be hidden   GAD 7 : Generalized Anxiety Score 11/29/2020 04/26/2020 02/08/2019  Nervous, Anxious, on Edge 0 - 3  Control/stop worrying 0 3 3  Worry too much - different things 0 2 3  Trouble relaxing 0 2 2  Restless 0 1 0  Easily annoyed or irritable 0 0 0  Afraid - awful might happen 0 3 2  Total GAD 7 Score 0 - 13  Anxiety Difficulty Not difficult at all - Not difficult at all   Assessment & Plan:   1. Type 2 diabetes mellitus without complication, without long-term current use of insulin (HCC) Home glucoses are looking good. I will check an A1c today and plan to reassess her in 3 months. We will continue Januvia.  - Glucose, random - Hemoglobin A1c  2. Essential hypertension Blood pressure is at goal. Continue Benicar.  3. Hyperlipidemia, unspecified hyperlipidemia type I will plan to check fasting lipids at her next appointment. Continue Crestor.  4. Obesity (BMI 30.0-34.9) I encouraged Ms. Romagnoli to continue towork at weight loss. She is approaching moving from obesity to overweight range.  5. Depression with anxiety Ms. Kuper has had a good response tot he Paxil. I will renew her Klonipin.  - clonazePAM (KLONOPIN) 0.5 MG tablet; Take 1 tablet (0.5 mg total) by mouth 2 (two) times daily as needed for anxiety.  Dispense: 30 tablet; Refill: 1  Haydee Salter, MD

## 2020-12-07 ENCOUNTER — Ambulatory Visit: Payer: 59 | Admitting: Psychology

## 2020-12-21 ENCOUNTER — Ambulatory Visit: Payer: 59 | Admitting: Cardiovascular Disease

## 2021-01-07 ENCOUNTER — Other Ambulatory Visit: Payer: Self-pay | Admitting: Family Medicine

## 2021-01-16 ENCOUNTER — Other Ambulatory Visit: Payer: Self-pay | Admitting: Family Medicine

## 2021-01-16 DIAGNOSIS — F418 Other specified anxiety disorders: Secondary | ICD-10-CM

## 2021-02-06 ENCOUNTER — Other Ambulatory Visit: Payer: Self-pay | Admitting: Family Medicine

## 2021-02-13 ENCOUNTER — Ambulatory Visit (INDEPENDENT_AMBULATORY_CARE_PROVIDER_SITE_OTHER): Payer: 59

## 2021-02-13 ENCOUNTER — Ambulatory Visit: Payer: Self-pay

## 2021-02-13 ENCOUNTER — Encounter: Payer: Self-pay | Admitting: Physician Assistant

## 2021-02-13 ENCOUNTER — Ambulatory Visit (INDEPENDENT_AMBULATORY_CARE_PROVIDER_SITE_OTHER): Payer: 59 | Admitting: Physician Assistant

## 2021-02-13 DIAGNOSIS — M5441 Lumbago with sciatica, right side: Secondary | ICD-10-CM | POA: Diagnosis not present

## 2021-02-13 DIAGNOSIS — M5442 Lumbago with sciatica, left side: Secondary | ICD-10-CM

## 2021-02-13 DIAGNOSIS — M25551 Pain in right hip: Secondary | ICD-10-CM | POA: Diagnosis not present

## 2021-02-13 DIAGNOSIS — G8929 Other chronic pain: Secondary | ICD-10-CM | POA: Diagnosis not present

## 2021-02-13 DIAGNOSIS — M25552 Pain in left hip: Secondary | ICD-10-CM | POA: Diagnosis not present

## 2021-02-13 NOTE — Addendum Note (Signed)
Addended by: Robyne Peers on: 02/13/2021 04:08 PM   Modules accepted: Orders

## 2021-02-13 NOTE — Progress Notes (Signed)
Office Visit Note   Patient: [name]           Date of Birth: 1965-11-03           MRN: [id number] Visit Date: 02/13/2021              Requested by: Haydee Salter, MD [place],  [place] [id number] PCP: Haydee Salter, MD   Assessment & Plan: Visit Diagnoses:  1. Chronic bilateral low back pain with bilateral sciatica   2. Pain in left hip   3. Pain in right hip     Plan: Given the fact the patient's failed conservative treatment which is consisted of time, medications, and physical therapy recommend MRI.  MRI of the lumbar spine to rule out HNP as a source of her radicular symptoms down her left leg.  She will follow-up with [place] after the MRI to go over results discuss further treatment.  Discussed trochanteric injection on the left patient defers she states she just wants to get to the bottom of what was causing the pain down her left leg.  Follow-Up Instructions: Return After MRI.   Orders:  Orders Placed This Encounter  Procedures   XR Lumbar Spine 2-3 Views   XR HIP UNILAT W OR W/O PELVIS 2-3 VIEWS LEFT   XR HIPS BILAT W OR W/O PELVIS 2V   No orders of the defined types were placed in this encounter.     Procedures: No procedures performed   Clinical Data: No additional findings.   Subjective: Chief Complaint  Patient presents with   Lower Back - Pain   Left Hip - Pain   Right Hip - Pain    HPI Mrs. Kallstrom is well-known to Dr. Ninfa Linden service comes in today with low back pain and bilateral hip pain.  This been ongoing for several years.  She has had trochanteric injections, reports a left hip intra-articular injection in the past without any real relief.  She has had no new injury.  She is having radicular symptoms with tingling and pain down her left leg into her foot at times.  She does have some low back pain awakens her no fevers or chills.  No bowel bladder dysfunction.  She is diabetic and reports that her  hemoglobin A1c was 6.5 at last check.  She is currently involved in an exercise class tries to do some stretching. Review of Systems See HPI otherwise negative  Objective: Vital Signs: LMP  (LMP Unknown)   Physical Exam Constitutional:      Appearance: She is not ill-appearing or diaphoretic.  Pulmonary:     Effort: Pulmonary effort is normal.  Neurological:     Mental Status: She is alert and oriented to person, place, and time.  Psychiatric:        Behavior: Behavior normal.    Ortho Exam Bilateral hips excellent range of motion without pain.  Tenderness over the left hip trochanteric region.  5 /5 strength throughout lower extremities against resistance.  Tight hamstrings bilaterally.  Straight leg raise is negative bilaterally. Specialty Comments:  No specialty comments available.  Imaging: XR HIPS BILAT W OR W/O PELVIS 2V  Result Date: 02/13/2021 Bilateral hips 2 views: Compared to films 2019 there is no significant interval change.  Mild arthritis of both hips with periarticular spurring proximal acetabulum.  No acute fractures.  Both hips well located.  XR Lumbar Spine 2-3 Views  Result Date: 02/13/2021 Lumbar spine 2 views: This  space overall well-maintained.  Endplate spurring multiple levels.  Normal lordotic curvature.  No spondylolisthesis.  No acute fractures or acute findings.    PMFS History: Patient Active Problem List   Diagnosis Date Noted   Depression with anxiety 09/27/2020   Migraine headache 09/27/2020   Insomnia 09/27/2020   Thyroid nodule 09/27/2020   Obesity (BMI 30.0-34.9) 03/09/2019   Acute right-sided low back pain with right-sided sciatica 06/10/2017   Trochanteric bursitis, right hip 05/27/2017   Atypical chest pain 11/18/2012   DM (diabetes mellitus), type 2 ([place]) 11/18/2012   Essential hypertension 11/18/2012   Hyperlipidemia    Past Medical History:  Diagnosis Date   Abnormal uterine bleeding (AUB)    Arthritis    Diabetes  mellitus    Elevated cholesterol    Endometrial polyp    Endometrial polyp    Endometriosis    Hypertension    Migraines    Nodule    Ovarian cyst     Family History  Problem Relation Age of Onset   Hypertension Mother    Diabetes Mother    COPD Mother    Cancer Mother        Lung   Hypertension Father    Stroke Father 34   Diabetes Sister    Hypertension Brother    Diabetes Maternal Aunt    Cancer Maternal Aunt        Kidney   Kidney disease Maternal Aunt    Diabetes Maternal Aunt    Stroke Maternal Grandfather    Diabetes Maternal Grandfather    Colon cancer Neg Hx    Esophageal cancer Neg Hx    Rectal cancer Neg Hx    Stomach cancer Neg Hx     Past Surgical History:  Procedure Laterality Date   COMBINED HYSTEROSCOPY DIAGNOSTIC / D&C  yrs ago   [place]   with laser adhesions   DILITATION & CURRETTAGE/HYSTROSCOPY WITH NOVASURE ABLATION N/A 11/04/2019   Procedure: DILATATION & CURETTAGE/HYSTEROSCOPY WITH NOVASURE ABLATION;  Surgeon: Joseph Pierini, MD;  Location: [facility];  Service: Gynecology;  Laterality: N/A;   INTRAUTERINE DEVICE INSERTION     mirena-Inserted 05-16-14   KNEE SURGERY Left yrs ago   meniscurs tear repair   mirena removed  2021   OOPHORECTOMY  2008   left   ROTATOR CUFF REPAIR Right yrs ago   WRIST SURGERY Right    gang. cyst   Social History   Occupational History   Not on file  Tobacco Use   Smoking status: Never   Smokeless tobacco: Never  Vaping Use   Vaping Use: Never used  Substance and Sexual Activity   Alcohol use: Not Currently   Drug use: No   Sexual activity: Yes    Birth control/protection: Post-menopausal

## 2021-02-14 ENCOUNTER — Other Ambulatory Visit: Payer: Self-pay | Admitting: Physician Assistant

## 2021-02-14 MED ORDER — TRAMADOL HCL 50 MG PO TABS: 50.0000 mg | ORAL_TABLET | Freq: Four times a day (QID) | ORAL | 0 refills | Status: DC | PRN

## 2021-02-14 NOTE — Telephone Encounter (Signed)
Please advise 

## 2021-02-26 ENCOUNTER — Encounter: Payer: Self-pay | Admitting: Family Medicine

## 2021-02-26 NOTE — Telephone Encounter (Signed)
Appointment was already scheduled then canceled by patient.  Dm/cma

## 2021-02-27 ENCOUNTER — Ambulatory Visit: Payer: 59 | Admitting: Family Medicine

## 2021-02-27 ENCOUNTER — Encounter: Payer: Self-pay | Admitting: Emergency Medicine

## 2021-02-27 ENCOUNTER — Ambulatory Visit
Admission: EM | Admit: 2021-02-27 | Discharge: 2021-02-27 | Disposition: A | Discharge status: Home / Self Care | Payer: 59 | Attending: Physician Assistant | Admitting: Physician Assistant

## 2021-02-27 ENCOUNTER — Other Ambulatory Visit: Payer: Self-pay

## 2021-02-27 DIAGNOSIS — J0100 Acute maxillary sinusitis, unspecified: Secondary | ICD-10-CM

## 2021-02-27 MED ORDER — AMOXICILLIN-POT CLAVULANATE 875-125 MG PO TABS: 1.0000 | ORAL_TABLET | Freq: Two times a day (BID) | ORAL | 0 refills | Status: DC

## 2021-02-27 NOTE — ED Provider Notes (Signed)
[name]    CSN: [id number] Arrival date & time: 02/27/21  1359      History   Chief Complaint Chief Complaint  Patient presents with   Cough    HPI [name] is a 55 y.o. female.   Patient here today for evaluation of nasal congestion that started about a week ago. She states that she has now developed left sided sinus tenderness and swelling. She had fever initially but this has resolved. She has tried OTC meds without significant relief.   The history is provided by the patient.   Past Medical History:  Diagnosis Date   Abnormal uterine bleeding (AUB)    Arthritis    Diabetes mellitus    Elevated cholesterol    Endometrial polyp    Endometrial polyp    Endometriosis    Hypertension    Migraines    Nodule    Ovarian cyst     Patient Active Problem List   Diagnosis Date Noted   Depression with anxiety 09/27/2020   Migraine headache 09/27/2020   Insomnia 09/27/2020   Thyroid nodule 09/27/2020   Obesity (BMI 30.0-34.9) 03/09/2019   Acute right-sided low back pain with right-sided sciatica 06/10/2017   Trochanteric bursitis, right hip 05/27/2017   Atypical chest pain 11/18/2012   DM (diabetes mellitus), type 2 ([place]) 11/18/2012   Essential hypertension 11/18/2012   Hyperlipidemia     Past Surgical History:  Procedure Laterality Date   COMBINED HYSTEROSCOPY DIAGNOSTIC / D&C  yrs ago   [place]   with laser adhesions   DILITATION & CURRETTAGE/HYSTROSCOPY WITH NOVASURE ABLATION N/A 11/04/2019   Procedure: DILATATION & CURETTAGE/HYSTEROSCOPY WITH NOVASURE ABLATION;  Surgeon: Joseph Pierini, MD;  Location: [facility];  Service: Gynecology;  Laterality: N/A;   INTRAUTERINE DEVICE INSERTION     mirena-Inserted 05-16-14   KNEE SURGERY Left yrs ago   meniscurs tear repair   mirena removed  2021   OOPHORECTOMY  2008   left   ROTATOR CUFF REPAIR Right yrs ago   WRIST SURGERY Right    gang. cyst     OB History     Gravida  2   Para  1   Term  1   Preterm      AB  1   Living  1      SAB      IAB      Ectopic      Multiple      Live Births               Home Medications    Prior to Admission medications   Medication Sig Start Date End Date Taking? Authorizing Provider  amoxicillin-clavulanate (AUGMENTIN) 875-125 MG tablet Take 1 tablet by mouth every 12 (twelve) hours. 02/27/21  Yes Francene Finders, PA-C  acetaminophen (TYLENOL) 500 MG tablet Take 2 tablets (1,000 mg total) by mouth every 6 (six) hours as needed for mild pain. 11/04/19   Joseph Pierini, MD  Ascorbic Acid (VITAMIN C) 1000 MG tablet Take 1,000 mg by mouth daily.    [provider]  cholecalciferol (VITAMIN D3) 25 MCG (1000 UNIT) tablet Take 1,000 Units by mouth daily.    [provider]  clonazePAM (KLONOPIN) 0.5 MG tablet Take 1 tablet (0.5 mg total) by mouth 2 (two) times daily as needed for anxiety. 11/29/20   Haydee Salter, MD  diclofenac sodium (VOLTAREN) 1 % GEL APPLY 4 GRAMS TOPICALLY 4 (FOUR)  TIMES DAILY. RIGHT POSTERIOR TIBIAL TENDON 12/01/17   Mcarthur Rossetti, MD  JANUVIA 100 MG tablet TAKE 1 TABLET BY MOUTH DAILY AFTER BREAKFAST. 02/06/21   Haydee Salter, MD  olmesartan-hydrochlorothiazide (BENICAR HCT) 40-12.5 MG tablet Take 1 tablet by mouth daily. 12/22/18   Forrest Moron, MD  PARoxetine (PAXIL) 20 MG tablet Take 1 tablet (20 mg total) by mouth daily. 01/17/21 02/16/21  Haydee Salter, MD  rizatriptan (MAXALT-MLT) 10 MG disintegrating tablet TAKE 1 TABLET BY MOUTH AS NEEDED FOR MIGRAINE. MAY REPEAT IN 2 HOURS IF NEEDED 12/22/18   Delia Chimes A, MD  rosuvastatin (CRESTOR) 10 MG tablet TAKE 1 TABLET BY MOUTH EVERY DAY WITH BREAKFAST 01/07/21   Haydee Salter, MD  tiZANidine (ZANAFLEX) 4 MG tablet Take 1 tablet (4 mg total) by mouth at bedtime. 04/24/20   Scot Jun, FNP  traMADol (ULTRAM) 50 MG tablet Take 1 tablet (50 mg total) by mouth  every 6 (six) hours as needed. 02/14/21   Pete Pelt, PA-C  zinc gluconate 50 MG tablet Take 50 mg by mouth daily.    [provider]    Family History Family History  Problem Relation Age of Onset   Hypertension Mother    Diabetes Mother    COPD Mother    Cancer Mother        Lung   Hypertension Father    Stroke Father 19   Diabetes Sister    Hypertension Brother    Diabetes Maternal Aunt    Cancer Maternal Aunt        Kidney   Kidney disease Maternal Aunt    Diabetes Maternal Aunt    Stroke Maternal Grandfather    Diabetes Maternal Grandfather    Colon cancer Neg Hx    Esophageal cancer Neg Hx    Rectal cancer Neg Hx    Stomach cancer Neg Hx     Social History Social History   Tobacco Use   Smoking status: Never   Smokeless tobacco: Never  Vaping Use   Vaping Use: Never used  Substance Use Topics   Alcohol use: Not Currently   Drug use: No     Allergies   Dilaudid [hydromorphone hcl], Sulfa antibiotics, Tylox [oxycodone-acetaminophen], and Clindamycin/lincomycin   Review of Systems Review of Systems  Constitutional:  Negative for chills and fever.  HENT:  Positive for congestion, sinus pressure and sore throat (resolved). Negative for ear pain.   Eyes:  Negative for discharge and redness.  Respiratory:  Negative for shortness of breath and wheezing.   Gastrointestinal:  Negative for abdominal pain, diarrhea, nausea and vomiting.    Physical Exam Triage Vital Signs ED Triage Vitals  Enc Vitals Group     BP      Pulse      Resp      Temp      Temp src      SpO2      Weight      Height      Head Circumference      Peak Flow      Pain Score      Pain Loc      Pain Edu?      Excl. in [place]?    No data found.  Updated Vital Signs BP 134/82 (BP Location: Right Arm)    Pulse 73    Temp 98.1 F (36.7 C) (Oral)    Resp 16    LMP  (LMP Unknown)  SpO2 96%      Physical Exam Vitals and nursing note reviewed.  Constitutional:       General: She is not in acute distress.    Appearance: Normal appearance. She is not ill-appearing.  HENT:     Head: Normocephalic and atraumatic.     Right Ear: Tympanic membrane normal.     Left Ear: Tympanic membrane normal.     Nose: Congestion present.     Mouth/Throat:     Mouth: Mucous membranes are moist.     Pharynx: No oropharyngeal exudate or posterior oropharyngeal erythema.  Eyes:     Conjunctiva/sclera: Conjunctivae normal.  Cardiovascular:     Rate and Rhythm: Normal rate and regular rhythm.     Heart sounds: Normal heart sounds. No murmur heard. Pulmonary:     Effort: Pulmonary effort is normal. No respiratory distress.     Breath sounds: Normal breath sounds. No wheezing, rhonchi or rales.  Skin:    General: Skin is warm and dry.  Neurological:     Mental Status: She is alert.  Psychiatric:        Mood and Affect: Mood normal.        Thought Content: Thought content normal.     UC Treatments / Results  Labs (all labs ordered are listed, but only abnormal results are displayed) Labs Reviewed - No data to display  EKG   Radiology No results found.  Procedures Procedures (including critical care time)  Medications Ordered in UC Medications - No data to display  Initial Impression / Assessment and Plan / UC Course  I have reviewed the triage vital signs and the nursing notes.  Pertinent labs & imaging results that were available during my care of the patient were reviewed by me and considered in my medical decision making (see chart for details).   Augmentin prescribed for sinusitis. Recommended follow up with any further concerns.   Final Clinical Impressions(s) / UC Diagnoses   Final diagnoses:  Acute maxillary sinusitis, recurrence not specified   Discharge Instructions   None    ED Prescriptions     Medication Sig Dispense Auth. Provider   amoxicillin-clavulanate (AUGMENTIN) 875-125 MG tablet Take 1 tablet by mouth every 12 (twelve)  hours. 14 tablet Francene Finders, PA-C      PDMP not reviewed this encounter.   Francene Finders, PA-C 02/27/21 1655

## 2021-02-27 NOTE — ED Triage Notes (Signed)
Nasal congestion since last Wednesday. 2 negative covid tests. Feels like the left side of her face is tender with pressure in her sinuses

## 2021-02-28 ENCOUNTER — Other Ambulatory Visit: Payer: Self-pay | Admitting: Family Medicine

## 2021-02-28 ENCOUNTER — Telehealth: Payer: 59 | Admitting: Family Medicine

## 2021-03-01 ENCOUNTER — Ambulatory Visit: Payer: 59 | Admitting: Family Medicine

## 2021-03-02 ENCOUNTER — Other Ambulatory Visit: Payer: Self-pay

## 2021-03-02 ENCOUNTER — Ambulatory Visit
Admission: RE | Admit: 2021-03-02 | Discharge: 2021-03-02 | Disposition: A | Discharge status: Home / Self Care | Payer: 59 | Source: Ambulatory Visit | Attending: Physician Assistant | Admitting: Physician Assistant

## 2021-03-02 DIAGNOSIS — M5442 Lumbago with sciatica, left side: Secondary | ICD-10-CM

## 2021-03-02 DIAGNOSIS — G8929 Other chronic pain: Secondary | ICD-10-CM

## 2021-03-06 ENCOUNTER — Ambulatory Visit (INDEPENDENT_AMBULATORY_CARE_PROVIDER_SITE_OTHER): Payer: 59 | Admitting: Orthopaedic Surgery

## 2021-03-06 ENCOUNTER — Encounter: Payer: Self-pay | Admitting: Orthopaedic Surgery

## 2021-03-06 DIAGNOSIS — G8929 Other chronic pain: Secondary | ICD-10-CM

## 2021-03-06 DIAGNOSIS — M5441 Lumbago with sciatica, right side: Secondary | ICD-10-CM

## 2021-03-06 DIAGNOSIS — M25552 Pain in left hip: Secondary | ICD-10-CM

## 2021-03-06 DIAGNOSIS — M5442 Lumbago with sciatica, left side: Secondary | ICD-10-CM | POA: Diagnosis not present

## 2021-03-06 DIAGNOSIS — M25551 Pain in right hip: Secondary | ICD-10-CM | POA: Diagnosis not present

## 2021-03-06 MED ORDER — GABAPENTIN 100 MG PO CAPS: 100.0000 mg | ORAL_CAPSULE | Freq: Three times a day (TID) | ORAL | 1 refills | Status: DC | PRN

## 2021-03-06 MED ORDER — NABUMETONE 500 MG PO TABS: 500.0000 mg | ORAL_TABLET | Freq: Two times a day (BID) | ORAL | 2 refills | Status: DC | PRN

## 2021-03-06 MED ORDER — TIZANIDINE HCL 4 MG PO TABS: 4.0000 mg | ORAL_TABLET | Freq: Three times a day (TID) | ORAL | 0 refills | Status: DC | PRN

## 2021-03-06 NOTE — Progress Notes (Signed)
The patient continues to have significant low back pain in the lower aspect of the lumbar spine bilaterally sometimes more than right sometimes left.  She has chronic bilateral hip pain of the trochanteric areas of the hips.  We sent her for MRI of her lumbar spine if the very conservative treatment.  She is a diabetic but has good control of her diabetes.  She is lost significant weight as she tries to stay healthy and get in shape.  She has not had any physical therapy.  On exam she is very uncomfortable with the lower aspect of her lumbar spine.  Again this does cause her to have a discomfort sleeping as well.  Both hips move smoothly and fluidly in terms of the groin but have significant pain to palpation of the trochanteric area over the left worse than the right.  MRI does show degenerative changes at multiple levels but this affects mainly the facet joints with no significant stenosis.  There is some slight foraminal stenosis at L3-L4.  Most of the facet disease is at L3-L4, L4-L5 and L5-S1.  I would like to send her to Dr. Ernestina Patches for facet joint injections likely at L4-L5 bilaterally but also have him take a look at her studies.  I would like to send her to outpatient physical therapy to work on her back and her hips and we will try a combination of Relafen, Neurontin and Zanaflex to see if this will help with her symptoms.  From my standpoint, I will see her back in 6 weeks after she has had injections by Dr. Ernestina Patches and outpatient physical therapy.

## 2021-03-07 ENCOUNTER — Other Ambulatory Visit: Payer: Self-pay

## 2021-03-07 DIAGNOSIS — G8929 Other chronic pain: Secondary | ICD-10-CM

## 2021-03-07 DIAGNOSIS — M5442 Lumbago with sciatica, left side: Secondary | ICD-10-CM

## 2021-03-14 ENCOUNTER — Other Ambulatory Visit: Payer: Self-pay | Admitting: Family Medicine

## 2021-03-14 DIAGNOSIS — F418 Other specified anxiety disorders: Secondary | ICD-10-CM

## 2021-03-14 NOTE — Telephone Encounter (Signed)
Refill request for: Clonazepam 0.5 mg LR 11/29/20, #30, 1 rf LOV 11/29/20 FOV  03/28/21  Olmesartan-HZTZ 40/12.5 mg Recently sent 02/27/21 , #90  Please review and advise.  Thanks. Dm/cma

## 2021-03-20 ENCOUNTER — Other Ambulatory Visit: Payer: Self-pay | Admitting: Family Medicine

## 2021-03-27 ENCOUNTER — Other Ambulatory Visit: Payer: Self-pay

## 2021-03-28 ENCOUNTER — Ambulatory Visit: Payer: 59 | Admitting: Family Medicine

## 2021-03-28 VITALS — BP 130/84 | HR 66 | Temp 97.1°F | Ht 59.0 in | Wt 168.2 lb

## 2021-03-28 DIAGNOSIS — Z23 Encounter for immunization: Secondary | ICD-10-CM | POA: Diagnosis not present

## 2021-03-28 DIAGNOSIS — E669 Obesity, unspecified: Secondary | ICD-10-CM | POA: Diagnosis not present

## 2021-03-28 DIAGNOSIS — E785 Hyperlipidemia, unspecified: Secondary | ICD-10-CM | POA: Diagnosis not present

## 2021-03-28 DIAGNOSIS — I10 Essential (primary) hypertension: Secondary | ICD-10-CM

## 2021-03-28 DIAGNOSIS — F418 Other specified anxiety disorders: Secondary | ICD-10-CM

## 2021-03-28 DIAGNOSIS — E119 Type 2 diabetes mellitus without complications: Secondary | ICD-10-CM

## 2021-03-28 LAB — COMPREHENSIVE METABOLIC PANEL
ALT: 11 U/L (ref 0–35)
AST: 19 U/L (ref 0–37)
Albumin: 4.7 g/dL (ref 3.5–5.2)
Alkaline Phosphatase: 62 U/L (ref 39–117)
BUN: 15 mg/dL (ref 6–23)
CO2: 31 mEq/L (ref 19–32)
Calcium: 10.1 mg/dL (ref 8.4–10.5)
Chloride: 98 mEq/L (ref 96–112)
Creatinine, Ser: 0.82 mg/dL (ref 0.40–1.20)
GFR: 81.56 mL/min (ref >60.00)
Glucose, Bld: 82 mg/dL (ref 70–99)
Potassium: 4.1 mEq/L (ref 3.5–5.1)
Sodium: 137 mEq/L (ref 135–145)
Total Bilirubin: 0.5 mg/dL (ref 0.2–1.2)
Total Protein: 8.2 g/dL (ref 6.0–8.3)

## 2021-03-28 LAB — HEMOGLOBIN A1C: Hgb A1c MFr Bld: 6.5 % (ref 4.6–6.5)

## 2021-03-28 LAB — LIPID PANEL
Cholesterol: 158 mg/dL (ref 0–200)
HDL: 75.2 mg/dL (ref >39.00)
LDL Cholesterol: 70 mg/dL (ref 0–99)
NonHDL: 82.62
Total CHOL/HDL Ratio: 2
Triglycerides: 61 mg/dL (ref 0.0–149.0)
VLDL: 12.2 mg/dL (ref 0.0–40.0)

## 2021-03-28 NOTE — Therapy (Signed)
OUTPATIENT PHYSICAL THERAPY THORACOLUMBAR EVALUATION   Patient Name: [name] MRN: [id number] DOB:1965/10/01, 56 y.o., female Today's Date: 03/29/2021   PT End of Session - 03/29/21 1402     Visit Number 1    Number of Visits 9    Date for PT Re-Evaluation 05/24/21    Authorization Type [employer]    PT Start Time 1345    PT Stop Time 1430    PT Time Calculation (min) 45 min    Activity Tolerance Patient tolerated treatment well    Behavior During Therapy WFL for tasks assessed/performed             Past Medical History:  Diagnosis Date   Abnormal uterine bleeding (AUB)    Arthritis    Diabetes mellitus    Elevated cholesterol    Endometrial polyp    Endometrial polyp    Endometriosis    Hypertension    Migraines    Nodule    Ovarian cyst    Past Surgical History:  Procedure Laterality Date   COMBINED HYSTEROSCOPY DIAGNOSTIC / D&C  yrs ago   [place]   with laser adhesions   DILITATION & CURRETTAGE/HYSTROSCOPY WITH NOVASURE ABLATION N/A 11/04/2019   Procedure: DILATATION & CURETTAGE/HYSTEROSCOPY WITH NOVASURE ABLATION;  Surgeon: Joseph Pierini, MD;  Location: [facility];  Service: Gynecology;  Laterality: N/A;   INTRAUTERINE DEVICE INSERTION     mirena-Inserted 05-16-14   KNEE SURGERY Left yrs ago   meniscurs tear repair   mirena removed  2021   OOPHORECTOMY  2008   left   ROTATOR CUFF REPAIR Right yrs ago   WRIST SURGERY Right    gang. cyst   Patient Active Problem List   Diagnosis Date Noted   Depression with anxiety 09/27/2020   Migraine headache 09/27/2020   Insomnia 09/27/2020   Thyroid nodule 09/27/2020   Obesity (BMI 30.0-34.9) 03/09/2019   Acute right-sided low back pain with right-sided sciatica 06/10/2017   Trochanteric bursitis, right hip 05/27/2017   Atypical chest pain 11/18/2012   Type 2 diabetes mellitus ([place]) 11/18/2012   Essential hypertension 11/18/2012   Hyperlipidemia     PCP:  Haydee Salter, MD  REFERRING PROVIDER: Mcarthur Rossetti*  REFERRING DIAG: M54.42,M54.41,G89.29 (ICD-10-CM) - Chronic bilateral low back pain with bilateral sciatica  THERAPY DIAG:  Chronic bilateral low back pain with sciatica, sciatica laterality unspecified  Muscle weakness (generalized)  Pain in left hip  Pain in right hip  ONSET DATE: 2020  SUBJECTIVE:  SUBJECTIVE STATEMENT: Pt reports primary c/o LBP and BIL Lt>Rt hip pain of insidious onset lasting two years. She reports that her hip pain is often associated with increase in LBP. She denies any N/T other than numbness that can occur when laying on her hip for long periods of time. 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 Pt also reports morning stiffness in her Lt hip lasting <30 minutes. Aggravating factors include laying on Lt hip, prolonged sitting >30 minutes, prolonged standing/ walking >1 hour, and bending forward. Easing factors include positional change, once weekly exercise class, pain medications. She is scheduled for corticosteroid injections on February 7th, but she states she would like to forego this treatment if she is able to make conservative improvements. Red flag screening negative.  Current pain is 5/10. Worst pain is 10/10. Best pain is 1/10. PERTINENT HISTORY:  None  PAIN:  Are you having pain? Yes NPRS scale: 5/10 Pain location: Low back Pain orientation: Bilateral  PAIN TYPE: dull and shooting Aggravating factors: prolonged sitting >30 minutes, prolonged standing/ walking >1 hour, and bending forward Relieving factors: positional change, once weekly exercise class, pain medications  PRECAUTIONS: None  WEIGHT BEARING RESTRICTIONS No  FALLS:  Has patient fallen in last 6 months? No, Number of  falls: 0  LIVING ENVIRONMENT: Lives with: lives with their family Lives in: House/apartment Stairs: No;  Has following equipment at home: None  OCCUPATION: Accounts payable, sitting most of the day  PLOF: [place] working, bending to pick up groceries, working out   OBJECTIVE:   DIAGNOSTIC FINDINGS:  03/02/2021: MR Lumbar Spine WO Contrast: IMPRESSION: 1. Mild degenerative disc bulging and facet hypertrophy at L2-3 through L4-5 with resultant mild bilateral L2 through L4 foraminal stenosis. Mild spinal stenosis at the L3-4 level. 2. Mild to moderate multilevel facet hypertrophy throughout the lumbar spine, most pronounced at L4-5 bilaterally. Findings could contribute to underlying back pain.  02/13/2021: XR Bilateral hips 2 views: IMPRESSION: Compared to films 2019 there is no significant  interval change.  Mild arthritis of both hips with periarticular spurring  proximal acetabulum.  No acute fractures.  Both hips well located.  PATIENT SURVEYS:  FOTO 63%, 68%  SCREENING FOR RED FLAGS: Bowel or bladder incontinence: No Cauda equina syndrome: No  COGNITION:  Overall cognitive status: Within functional limits for tasks assessed     SENSATION:  Light touch: Appears intact    MUSCLE LENGTH: Thomas test: (+) BIL, severe limitation  POSTURE:  Decreased lumbar lordosis  PALPATION: TTP to lower lumbar paraspinals/ QL, exquisite TTP to Lt trochanter  PASSIVE ACCESSORIES: Painful and hypomobile CPAs from L3-S1  LUMBAR AROM  AROM AROM  03/29/2021  Flexion 45 with p! on return to standing  Extension 5p!  Right lateral flexion 12  Left lateral flexion 15p!  Right rotation 40  Left rotation 50p!   (Blank rows = not tested)  LE AROM/PROM:  A/PROM Right 03/29/2021 Left 03/29/2021  Hip flexion 110/120 100/ 105 with lateral hip pain  Hip extension 15/20 5/10  Hip abduction 45/55 30p!/ 35p!  Hip adduction 24/30 5p!/ 10p!  Hip internal  rotation 38 30p!  Hip external rotation 35 20    LE MMT:  MMT Right 03/29/2021 Left 03/29/2021  Hip flexion 5/5 4/5  Hip extension 3+/5 3/5p!  Hip abduction 3+/5 3/5p!  Hip internal rotation 5/5 5/5  Hip external rotation 4+/5 4/5p!   (Blank rows = not tested)  LUMBAR SPECIAL TESTS:  Slump test: (-) BIL SLR: (-) BIL ASLR: (-) BIL Repeated lumbar extension: (+) for centralization of LBP Hip scour: (-) BIL  FUNCTIONAL TESTS:  Not performed at this visit    TODAY'S TREATMENT  [facility] Adult PT Treatment:                                                DATE: 03/29/2021 Therapeutic Exercise: Glute bridge x10 Sidelying hip abduction x10 BIL Modified Thomas stretch x1 min BIL Supine 90/90 abdominal isometric with handhold resistance x30 seconds Manual Therapy: N/A Neuromuscular re-ed: N/A Therapeutic Activity: N/A Modalities: N/A Self Care: N/A    PATIENT EDUCATION:  Education details: Educated on probable underlying pathophysiology behind her pain presentation, POC, prognosis, and HEP Person educated: Patient Education method: Consulting civil engineer, Media planner, and Handouts Education comprehension: verbalized understanding and returned demonstration   HOME EXERCISE PROGRAM: Access Code: [facility] URL: [email or web address] Date: 03/29/2021 Prepared by: Vanessa [place]  Exercises Abdominal Isometric Hold - FEET OFF TABLE* - 1 x daily - 7 x weekly - 3 sets - 30-sec hold Supine Bridge - 1 x daily - 7 x weekly - 3 sets - 10 reps - 3-sec hold Sidelying Hip Abduction - 1 x daily - 7 x weekly - 2 sets - 10 reps - 3-sec hold Modified Thomas Stretch - 1 x daily - 7 x weekly - 2 sets - 1-min hold   ASSESSMENT:  CLINICAL IMPRESSION: Patient is a 56 y.o. F who was seen today for physical therapy evaluation and treatment for Chronic LBP and BIL Lt>Rt hip pain. Upon assessment, the pt's primary impairments include limited and painful lumbar AROM in flexion,  extension, Lt rotation, and Lt side bend, limited BIL global hip strength with pain and most limitation on Lt, painful and hypomobile lumbar passive accessories, TTP to BIL lumbar paraspinals/QL and Lt greater trochanter, and severely limited BIL hip flexor extensibility. These impairments are limiting patient from cleaning, driving, occupation, yard work, and shopping. Personal factors including 3+ comorbidities: See medical hx  are also affecting patient's functional outcome. Ruling up discogenic pathology/ facet dysfunction due to pain with prolonged sitting, pt report of shooting pain from low back to hips, centralization of pain with repeated lumbar extension, provocation of lumbar pain with unilateral movements. Also ruling up Lt hip OA due to hx of morning stiffness <30 minutes and capsular pattern of hip ROM limitations/pain. Patient will benefit from skilled PT to address above impairments and improve overall function.  REHAB POTENTIAL: Good  CLINICAL DECISION MAKING: Stable/uncomplicated  EVALUATION COMPLEXITY: Low   GOALS: Goals reviewed with patient? Yes  SHORT TERM GOALS:  STG Name Target Date Goal status  1 Pt will report understanding and adherence to her HEP in order to promote independence in the management of her primary impairments. Baseline: HEP provided at eval 04/26/2021 INITIAL   LONG TERM GOALS:   LTG Name Target Date Goal status  1 Pt will achieve a FOTO score of 68% in order to demonstrate improved functional ability as it relates to her LBP. Baseline: 63% 05/24/2021 INITIAL  2 Pt will achieve BIL global hip strength of 4+/5 or greater in order to return to working out with less limitation. Baseline: See MMT chart 05/24/2021 INITIAL  3 Pt will report ability to sit >1 hour with 0-2/10 pain in order to work through the day with less limitation.  Baseline: >5/10 pain after 30 minutes of sitting. 05/24/2021 INITIAL  4 Pt will achieve lumbar flexion AROM of >60 degrees  with 0-2/10 pain in order to get dressed with less limitation. Baseline: 45d with pain 05/24/2021 INITIAL   PLAN: PT FREQUENCY: 1x/week  PT DURATION: 8 weeks  PLANNED INTERVENTIONS: Therapeutic exercises, Therapeutic activity, Neuro Muscular re-education, Balance training, Gait training, Patient/Family education, Joint mobilization, Stair training, Dry Needling, Electrical stimulation, Spinal mobilization, Cryotherapy, Moist heat, Taping, and Manual therapy  PLAN FOR NEXT SESSION: Progress early hip/ core strengthening   Vanessa Kistler, PT, DPT 03/29/21 2:50 PM

## 2021-03-28 NOTE — Progress Notes (Signed)
[facility] [address] [place] [address] Dept: [phone] Dept Fax: [phone]  Chronic Care Office Visit  Subjective:    Patient ID: [name], female    DOB: [birth year], 56 y.o..   MRN: [id number]  Chief Complaint  Patient presents with   Follow-up    3 month f/u.  No concerns.      History of Present Illness:  Patient is in today for reassessment of chronic medical issues.  Ms. Bramble has a history of hypertension, managed on Benicar (olmesartan/HCTZ).   Ms. Lavalais has a history of Type 2 diabetes. She is managed on Januvia. We stopped her metformin last summer. She admits that she had some weight gain over the Holidays. She went with her brother to [facility] [place] [place]). She is recommitted to her diet and exercise, which had helped her with significant weight loss int he past. She does not want ot have to go back on metformin if she can avoid this.   Ms. Ohlendorf has a history of hyperlipidemia and is managed on Crestor.   Ms. Butch has a history of depression with anxiety. She is doing well on Paxil.  Past Medical History: Patient Active Problem List   Diagnosis Date Noted   Depression with anxiety 09/27/2020   Migraine headache 09/27/2020   Insomnia 09/27/2020   Thyroid nodule 09/27/2020   Obesity (BMI 30.0-34.9) 03/09/2019   Acute right-sided low back pain with right-sided sciatica 06/10/2017   Trochanteric bursitis, right hip 05/27/2017   Atypical chest pain 11/18/2012   Type 2 diabetes mellitus ([place]) 11/18/2012   Essential hypertension 11/18/2012   Hyperlipidemia    Past Surgical History:  Procedure Laterality Date   COMBINED HYSTEROSCOPY DIAGNOSTIC / D&C  yrs ago   [place]   with laser adhesions   DILITATION & CURRETTAGE/HYSTROSCOPY WITH NOVASURE ABLATION N/A 11/04/2019   Procedure: DILATATION & CURETTAGE/HYSTEROSCOPY WITH NOVASURE ABLATION;   Surgeon: Joseph Pierini, MD;  Location: [facility];  Service: Gynecology;  Laterality: N/A;   INTRAUTERINE DEVICE INSERTION     mirena-Inserted 05-16-14   KNEE SURGERY Left yrs ago   meniscurs tear repair   mirena removed  2021   OOPHORECTOMY  2008   left   ROTATOR CUFF REPAIR Right yrs ago   WRIST SURGERY Right    gang. cyst   Family History  Problem Relation Age of Onset   Hypertension Mother    Diabetes Mother    COPD Mother    Cancer Mother        Lung   Hypertension Father    Stroke Father 13   Diabetes Sister    Hypertension Brother    Diabetes Maternal Aunt    Cancer Maternal Aunt        Kidney   Kidney disease Maternal Aunt    Diabetes Maternal Aunt    Stroke Maternal Grandfather    Diabetes Maternal Grandfather    Colon cancer Neg Hx    Esophageal cancer Neg Hx    Rectal cancer Neg Hx    Stomach cancer Neg Hx     Outpatient Medications Prior to Visit  Medication Sig Dispense Refill   acetaminophen (TYLENOL) 500 MG tablet Take 2 tablets (1,000 mg total) by mouth every 6 (six) hours as needed for mild pain.     Ascorbic Acid (VITAMIN C) 1000 MG tablet Take 1,000 mg by mouth daily.     cholecalciferol (VITAMIN D3) 25 MCG (1000  UNIT) tablet Take 1,000 Units by mouth daily.     clonazePAM (KLONOPIN) 0.5 MG tablet TAKE 1 TABLET BY MOUTH 2 TIMES DAILY AS NEEDED FOR ANXIETY. 30 tablet 1   diclofenac sodium (VOLTAREN) 1 % GEL APPLY 4 GRAMS TOPICALLY 4 (FOUR) TIMES DAILY. RIGHT POSTERIOR TIBIAL TENDON 900 g 0   gabapentin (NEURONTIN) 100 MG capsule Take 1 capsule (100 mg total) by mouth 3 (three) times daily as needed. 60 capsule 1   JANUVIA 100 MG tablet TAKE 1 TABLET BY MOUTH DAILY AFTER BREAKFAST. 90 tablet 3   nabumetone (RELAFEN) 500 MG tablet Take 1 tablet (500 mg total) by mouth 2 (two) times daily as needed. 60 tablet 2   olmesartan-hydrochlorothiazide (BENICAR HCT) 40-12.5 MG tablet TAKE 1 TABLET BY MOUTH EVERY DAY 90 tablet 3   PARoxetine  (PAXIL) 20 MG tablet Take 1 tablet (20 mg total) by mouth daily. 90 tablet 3   rizatriptan (MAXALT-MLT) 10 MG disintegrating tablet TAKE 1 TABLET BY MOUTH AS NEEDED FOR MIGRAINE. MAY REPEAT IN 2 HOURS IF NEEDED 10 tablet 6   rosuvastatin (CRESTOR) 10 MG tablet TAKE 1 TABLET BY MOUTH EVERY DAY WITH BREAKFAST 90 tablet 3   tiZANidine (ZANAFLEX) 4 MG tablet Take 1 tablet (4 mg total) by mouth every 8 (eight) hours as needed for muscle spasms. 40 tablet 0   traMADol (ULTRAM) 50 MG tablet Take 1 tablet (50 mg total) by mouth every 6 (six) hours as needed. 30 tablet 0   zinc gluconate 50 MG tablet Take 50 mg by mouth daily.     amoxicillin-clavulanate (AUGMENTIN) 875-125 MG tablet Take 1 tablet by mouth every 12 (twelve) hours. 14 tablet 0   No facility-administered medications prior to visit.   Allergies  Allergen Reactions   Dilaudid [Hydromorphone Hcl] Other (See Comments)    Broke in sweat and started shaking, can take oral   Sulfa Antibiotics Hives   Tylox [Oxycodone-Acetaminophen] Nausea And Vomiting   Clindamycin/Lincomycin Rash    hives   Objective:   Today's Vitals   03/28/21 1051  BP: 130/84  Pulse: 66  Temp: (!) 97.1 F (36.2 C)  TempSrc: Temporal  SpO2: 97%  Weight: 168 lb 3.2 oz (76.3 kg)  Height: 4\' 11"  (1.499 m)   Body mass index is 33.97 kg/m.   General: Well developed, well nourished. No acute distress. Feet- Skin intact. No sign of maceration between toes. Nails are normal. Dorsalis pedis   and posterior tibial artery pulses are normal. 5.07 monofilament testing normal. Psych: Alert and oriented x3. Normal mood and affect.  Health Maintenance Due  Topic Date Due   COVID-19 Vaccine (5 - Booster for Pfizer series) 12/13/2020   Zoster Vaccines- Shingrix (2 of 2) 12/29/2020   OPHTHALMOLOGY EXAM  01/31/2021   Lab Results Last lipids Lab Results  Component Value Date   CHOL 124 12/22/2018   HDL 58 12/22/2018   LDLCALC 49 12/22/2018   TRIG 92 12/22/2018    CHOLHDL 2.1 12/22/2018   Last hemoglobin A1c Lab Results  Component Value Date   HGBA1C 6.5 11/29/2020   BMP Latest Ref Rng & Units 11/29/2020 09/07/2020 08/08/2020  Glucose 70 - 99 mg/dL 91 89 114(H)  BUN 6 - 24 mg/dL - 15 16  Creatinine 0.57 - 1.00 mg/dL - 0.87 0.83  BUN/Creat Ratio 9 - 23 - 17 -  Sodium 134 - 144 mmol/L - 141 139  Potassium 3.5 - 5.2 mmol/L - 4.3 3.4(L)  Chloride 96 - 106 mmol/L -  100 100  CO2 20 - 29 mmol/L - 25 28  Calcium 8.7 - 10.2 mg/dL - 9.9 9.6    Assessment & Plan:   1. Type 2 diabetes mellitus without complication, without long-term current use of insulin (HCC) Diabetes has been in good control on Januvia (sitagliptin). We will reassess her A1c today.  - Comprehensive metabolic panel - Hemoglobin A1c  2. Essential hypertension Blood pressure is adequately controlled, though mildly increased with her recent weight gain. She is recommitted to lose the extra 12 lbs. again.  3. Hyperlipidemia, unspecified hyperlipidemia type Lipids have been at goal on Crestor. Due for repeat.  - Lipid panel  4. Obesity (BMI 30.0-34.9) Weight is back up 12 lbs. Ms. Horlacher will be working to lose this back again.  5. Depression with anxiety Stable on Paxil.  Haydee Salter, MD

## 2021-03-29 ENCOUNTER — Other Ambulatory Visit: Payer: Self-pay

## 2021-03-29 ENCOUNTER — Ambulatory Visit: Payer: 59 | Attending: Orthopaedic Surgery

## 2021-03-29 DIAGNOSIS — M25551 Pain in right hip: Secondary | ICD-10-CM | POA: Diagnosis present

## 2021-03-29 DIAGNOSIS — M5440 Lumbago with sciatica, unspecified side: Secondary | ICD-10-CM | POA: Diagnosis not present

## 2021-03-29 DIAGNOSIS — M25552 Pain in left hip: Secondary | ICD-10-CM | POA: Insufficient documentation

## 2021-03-29 DIAGNOSIS — M5441 Lumbago with sciatica, right side: Secondary | ICD-10-CM | POA: Diagnosis not present

## 2021-03-29 DIAGNOSIS — M6281 Muscle weakness (generalized): Secondary | ICD-10-CM | POA: Insufficient documentation

## 2021-03-29 DIAGNOSIS — G8929 Other chronic pain: Secondary | ICD-10-CM | POA: Diagnosis present

## 2021-03-29 DIAGNOSIS — M5442 Lumbago with sciatica, left side: Secondary | ICD-10-CM | POA: Insufficient documentation

## 2021-04-05 ENCOUNTER — Ambulatory Visit: Payer: 59 | Admitting: Physical Therapy

## 2021-04-08 ENCOUNTER — Telehealth: Payer: Self-pay | Admitting: Physical Medicine and Rehabilitation

## 2021-04-08 NOTE — Telephone Encounter (Signed)
Patient called. She would like to RSC her appointment with Dr. Newton 

## 2021-04-09 ENCOUNTER — Ambulatory Visit: Payer: 59 | Admitting: Physical Medicine and Rehabilitation

## 2021-04-12 ENCOUNTER — Ambulatory Visit: Payer: 59 | Admitting: Physical Therapy

## 2021-04-17 ENCOUNTER — Ambulatory Visit: Payer: 59 | Admitting: Orthopaedic Surgery

## 2021-04-18 NOTE — Therapy (Incomplete)
OUTPATIENT PHYSICAL THERAPY TREATMENT NOTE   Patient Name: [name] MRN: [id number] DOB:02/24/1966, 56 y.o., female Today's Date: 04/18/2021  PCP: Haydee Salter, MD REFERRING PROVIDER: Haydee Salter, MD    Past Medical History:  Diagnosis Date   Abnormal uterine bleeding (AUB)    Arthritis    Diabetes mellitus    Elevated cholesterol    Endometrial polyp    Endometrial polyp    Endometriosis    Hypertension    Migraines    Nodule    Ovarian cyst    Past Surgical History:  Procedure Laterality Date   COMBINED HYSTEROSCOPY DIAGNOSTIC / D&C  yrs ago   [place]   with laser adhesions   DILITATION & CURRETTAGE/HYSTROSCOPY WITH NOVASURE ABLATION N/A 11/04/2019   Procedure: DILATATION & CURETTAGE/HYSTEROSCOPY WITH NOVASURE ABLATION;  Surgeon: Joseph Pierini, MD;  Location: [place];  Service: Gynecology;  Laterality: N/A;   INTRAUTERINE DEVICE INSERTION     mirena-Inserted 05-16-14   KNEE SURGERY Left yrs ago   meniscurs tear repair   mirena removed  2021   OOPHORECTOMY  2008   left   ROTATOR CUFF REPAIR Right yrs ago   WRIST SURGERY Right    gang. cyst   Patient Active Problem List   Diagnosis Date Noted   Depression with anxiety 09/27/2020   Migraine headache 09/27/2020   Insomnia 09/27/2020   Thyroid nodule 09/27/2020   Obesity (BMI 30.0-34.9) 03/09/2019   Acute right-sided low back pain with right-sided sciatica 06/10/2017   Trochanteric bursitis, right hip 05/27/2017   Atypical chest pain 11/18/2012   Type 2 diabetes mellitus ([place]) 11/18/2012   Essential hypertension 11/18/2012   Hyperlipidemia     REFERRING DIAG: M54.42,M54.41,G89.29 (ICD-10-CM) - Chronic bilateral low back pain with bilateral sciatica  THERAPY DIAG:  No diagnosis found.   SUBJECTIVE: ***  PAIN:  Are you having pain? Yes NPRS scale: 5/10 Pain location: Low back Pain orientation: Bilateral  PAIN TYPE: dull and  shooting Aggravating factors: prolonged sitting >30 minutes, prolonged standing/ walking >1 hour, and bending forward Relieving factors: positional change, once weekly exercise class, pain medications     OBJECTIVE:   *Unless otherwise noted, objective measures collected previously*  DIAGNOSTIC FINDINGS:  03/02/2021: MR Lumbar Spine WO Contrast: IMPRESSION: 1. Mild degenerative disc bulging and facet hypertrophy at L2-3 through L4-5 with resultant mild bilateral L2 through L4 foraminal stenosis. Mild spinal stenosis at the L3-4 level. 2. Mild to moderate multilevel facet hypertrophy throughout the lumbar spine, most pronounced at L4-5 bilaterally. Findings could contribute to underlying back pain.   02/13/2021: XR Bilateral hips 2 views: IMPRESSION: Compared to films 2019 there is no significant  interval change.  Mild arthritis of both hips with periarticular spurring  proximal acetabulum.  No acute fractures.  Both hips well located.   PATIENT SURVEYS:  FOTO 63%, 68%   SCREENING FOR RED FLAGS: Bowel or bladder incontinence: No Cauda equina syndrome: No   COGNITION:          Overall cognitive status: Within functional limits for tasks assessed                        SENSATION:          Light touch: Appears intact             MUSCLE LENGTH: Thomas test: (+) BIL, severe limitation   POSTURE:  Decreased lumbar lordosis   PALPATION: TTP to lower  lumbar paraspinals/ QL, exquisite TTP to Lt trochanter   PASSIVE ACCESSORIES: Painful and hypomobile CPAs from L3-S1   LUMBAR AROM   AROM AROM  03/29/2021  Flexion 45 with p! on return to standing  Extension 5p!  Right lateral flexion 12  Left lateral flexion 15p!  Right rotation 40  Left rotation 50p!   (Blank rows = not tested)   LE AROM/PROM:   A/PROM Right 03/29/2021 Left 03/29/2021  Hip flexion 110/120 100/ 105 with lateral hip pain  Hip extension 15/20 5/10  Hip abduction 45/55 30p!/ 35p!  Hip adduction  24/30 5p!/ 10p!  Hip internal rotation 38 30p!  Hip external rotation 35 20      LE MMT:   MMT Right 03/29/2021 Left 03/29/2021  Hip flexion 5/5 4/5  Hip extension 3+/5 3/5p!  Hip abduction 3+/5 3/5p!  Hip internal rotation 5/5 5/5  Hip external rotation 4+/5 4/5p!   (Blank rows = not tested)   LUMBAR SPECIAL TESTS:  Slump test: (-) BIL SLR: (-) BIL ASLR: (-) BIL Repeated lumbar extension: (+) for centralization of LBP Hip scour: (-) BIL   FUNCTIONAL TESTS:  Not performed at this visit       TODAY'S TREATMENT   [facility] Adult PT Treatment:                                                DATE: 04/19/2021 Therapeutic Exercise: *** Manual Therapy: *** Neuromuscular re-ed: *** Therapeutic Activity: *** Modalities: *** Self Care: ***   [facility] Adult PT Treatment:                                                DATE: 03/29/2021 Therapeutic Exercise: Glute bridge x10 Sidelying hip abduction x10 BIL Modified Thomas stretch x1 min BIL Supine 90/90 abdominal isometric with handhold resistance x30 seconds Manual Therapy: N/A Neuromuscular re-ed: N/A Therapeutic Activity: N/A Modalities: N/A Self Care: N/A       PATIENT EDUCATION:  Education details: Educated on probable underlying pathophysiology behind her pain presentation, POC, prognosis, and HEP Person educated: Patient Education method: Consulting civil engineer, Media planner, and Handouts Education comprehension: verbalized understanding and returned demonstration     HOME EXERCISE PROGRAM: Access Code: [facility] URL: [email or web address] Date: 03/29/2021 Prepared by: Vanessa McClenney Tract   Exercises Abdominal Isometric Hold - FEET OFF TABLE* - 1 x daily - 7 x weekly - 3 sets - 30-sec hold Supine Bridge - 1 x daily - 7 x weekly - 3 sets - 10 reps - 3-sec hold Sidelying Hip Abduction - 1 x daily - 7 x weekly - 2 sets - 10 reps - 3-sec hold Modified Thomas Stretch - 1 x daily - 7 x weekly - 2 sets - 1-min  hold     ASSESSMENT:   CLINICAL IMPRESSION: ***   REHAB POTENTIAL: Good   CLINICAL DECISION MAKING: Stable/uncomplicated   EVALUATION COMPLEXITY: Low     GOALS: Goals reviewed with patient? Yes   SHORT TERM GOALS:   STG Name Target Date Goal status  1 Pt will report understanding and adherence to her HEP in order to promote independence in the management of her primary impairments. Baseline: HEP provided at eval 04/26/2021 INITIAL    LONG TERM GOALS:  LTG Name Target Date Goal status  1 Pt will achieve a FOTO score of 68% in order to demonstrate improved functional ability as it relates to her LBP. Baseline: 63% 05/24/2021 INITIAL  2 Pt will achieve BIL global hip strength of 4+/5 or greater in order to return to working out with less limitation. Baseline: See MMT chart 05/24/2021 INITIAL  3 Pt will report ability to sit >1 hour with 0-2/10 pain in order to work through the day with less limitation. Baseline: >5/10 pain after 30 minutes of sitting. 05/24/2021 INITIAL  4 Pt will achieve lumbar flexion AROM of >60 degrees with 0-2/10 pain in order to get dressed with less limitation. Baseline: 45d with pain 05/24/2021 INITIAL    PLAN: PT FREQUENCY: 1x/week   PT DURATION: 8 weeks   PLANNED INTERVENTIONS: Therapeutic exercises, Therapeutic activity, Neuro Muscular re-education, Balance training, Gait training, Patient/Family education, Joint mobilization, Stair training, Dry Needling, Electrical stimulation, Spinal mobilization, Cryotherapy, Moist heat, Taping, and Manual therapy   PLAN FOR NEXT SESSION: Progress early hip/ core strengthening    Vanessa Luther, PT, DPT 04/18/21 5:01 PM

## 2021-04-19 ENCOUNTER — Ambulatory Visit: Payer: 59

## 2021-04-25 NOTE — Therapy (Signed)
OUTPATIENT PHYSICAL THERAPY TREATMENT NOTE   Patient Name: [name] MRN: [id number] DOB:1965-06-29, 56 y.o., female Today's Date: 04/26/2021  PCP: Haydee Salter, MD REFERRING PROVIDER: Mcarthur Rossetti*   PT End of Session - 04/26/21 1350     Visit Number 2    Number of Visits 9    Date for PT Re-Evaluation 05/24/21    Authorization Type [employer]    PT Start Time 1350    PT Stop Time 1430    PT Time Calculation (min) 40 min    Activity Tolerance Patient tolerated treatment well    Behavior During Therapy WFL for tasks assessed/performed             Past Medical History:  Diagnosis Date   Abnormal uterine bleeding (AUB)    Arthritis    Diabetes mellitus    Elevated cholesterol    Endometrial polyp    Endometrial polyp    Endometriosis    Hypertension    Migraines    Nodule    Ovarian cyst    Past Surgical History:  Procedure Laterality Date   COMBINED HYSTEROSCOPY DIAGNOSTIC / D&C  yrs ago   [place]   with laser adhesions   DILITATION & CURRETTAGE/HYSTROSCOPY WITH NOVASURE ABLATION N/A 11/04/2019   Procedure: DILATATION & CURETTAGE/HYSTEROSCOPY WITH NOVASURE ABLATION;  Surgeon: Joseph Pierini, MD;  Location: [place];  Service: Gynecology;  Laterality: N/A;   INTRAUTERINE DEVICE INSERTION     mirena-Inserted 05-16-14   KNEE SURGERY Left yrs ago   meniscurs tear repair   mirena removed  2021   OOPHORECTOMY  2008   left   ROTATOR CUFF REPAIR Right yrs ago   WRIST SURGERY Right    gang. cyst   Patient Active Problem List   Diagnosis Date Noted   Depression with anxiety 09/27/2020   Migraine headache 09/27/2020   Insomnia 09/27/2020   Thyroid nodule 09/27/2020   Obesity (BMI 30.0-34.9) 03/09/2019   Acute right-sided low back pain with right-sided sciatica 06/10/2017   Trochanteric bursitis, right hip 05/27/2017   Atypical chest pain 11/18/2012   Type 2 diabetes mellitus ([place]) 11/18/2012    Essential hypertension 11/18/2012   Hyperlipidemia     REFERRING DIAG: M54.42,M54.41,G89.29 (ICD-10-CM) - Chronic bilateral low back pain with bilateral sciatica  THERAPY DIAG:  Chronic bilateral low back pain with sciatica, sciatica laterality unspecified  Muscle weakness (generalized)  Pain in left hip  Pain in right hip   SUBJECTIVE: Pt reports 6/10 BIL LBP and hip pain today, adding that she has "good days and bad days." She adds that she has been doing her HEP daily.  PAIN:  Are you having pain? Yes NPRS scale: 6/10 Pain location: Low back Pain orientation: Bilateral  PAIN TYPE: dull and shooting Aggravating factors: prolonged sitting >30 minutes, prolonged standing/ walking >1 hour, and bending forward Relieving factors: positional change, once weekly exercise class, pain medications     OBJECTIVE:   *Unless otherwise noted, objective measures collected previously*  DIAGNOSTIC FINDINGS:  03/02/2021: MR Lumbar Spine WO Contrast: IMPRESSION: 1. Mild degenerative disc bulging and facet hypertrophy at L2-3 through L4-5 with resultant mild bilateral L2 through L4 foraminal stenosis. Mild spinal stenosis at the L3-4 level. 2. Mild to moderate multilevel facet hypertrophy throughout the lumbar spine, most pronounced at L4-5 bilaterally. Findings could contribute to underlying back pain.   02/13/2021: XR Bilateral hips 2 views: IMPRESSION: Compared to films 2019 there is no significant  interval change.  Mild arthritis of both hips with periarticular spurring  proximal acetabulum.  No acute fractures.  Both hips well located.   PATIENT SURVEYS:  FOTO 63%, 68%   SCREENING FOR RED FLAGS: Bowel or bladder incontinence: No Cauda equina syndrome: No   COGNITION:          Overall cognitive status: Within functional limits for tasks assessed                        SENSATION:          Light touch: Appears intact             MUSCLE LENGTH: Thomas test: (+) BIL,  severe limitation   POSTURE:  Decreased lumbar lordosis   PALPATION: TTP to lower lumbar paraspinals/ QL, exquisite TTP to Lt trochanter   PASSIVE ACCESSORIES: Painful and hypomobile CPAs from L3-S1   LUMBAR AROM   AROM AROM  03/29/2021  Flexion 45 with p! on return to standing  Extension 5p!  Right lateral flexion 12  Left lateral flexion 15p!  Right rotation 40  Left rotation 50p!   (Blank rows = not tested)   LE AROM/PROM:   A/PROM Right 03/29/2021 Left 03/29/2021  Hip flexion 110/120 100/ 105 with lateral hip pain  Hip extension 15/20 5/10  Hip abduction 45/55 30p!/ 35p!  Hip adduction 24/30 5p!/ 10p!  Hip internal rotation 38 30p!  Hip external rotation 35 20      LE MMT:   MMT Right 03/29/2021 Left 03/29/2021  Hip flexion 5/5 4/5  Hip extension 3+/5 3/5p!  Hip abduction 3+/5 3/5p!  Hip internal rotation 5/5 5/5  Hip external rotation 4+/5 4/5p!   (Blank rows = not tested)   LUMBAR SPECIAL TESTS:  Slump test: (-) BIL SLR: (-) BIL ASLR: (-) BIL Repeated lumbar extension: (+) for centralization of LBP Hip scour: (-) BIL   FUNCTIONAL TESTS:  Not performed at this visit       TODAY'S TREATMENT   [facility] Adult PT Treatment:                                                DATE: 04/26/2021 Therapeutic Exercise: NuStep level 6 x5 minutes while collecting subjective Kickstand stance Pallof press with 3# cable into hip ER and IR 2x10 with 5-sec hold each BIL Supine dead bugs 3x20 Prone with hips on Bosu ball and PT holding legs, trunk extension to neutral 3x10 Standing Cybex hip abduction with 25# cable 2x10 BIL Standing Cybex hip extension with 25# cable 2x10 BIL Manual Therapy: N/A Neuromuscular re-ed: N/A Therapeutic Activity: N/A Modalities: N/A Self Care: N/A   [facility] Adult PT Treatment:                                                DATE: 03/29/2021 Therapeutic Exercise: Glute bridge x10 Sidelying hip abduction x10 BIL Modified Thomas stretch  x1 min BIL Supine 90/90 abdominal isometric with handhold resistance x30 seconds Manual Therapy: N/A Neuromuscular re-ed: N/A Therapeutic Activity: N/A Modalities: N/A Self Care: N/A       PATIENT EDUCATION:  Education details: Educated on probable underlying pathophysiology behind her pain presentation, POC, prognosis, and HEP Person educated: Patient  Education method: Explanation, Demonstration, and Handouts Education comprehension: verbalized understanding and returned demonstration     HOME EXERCISE PROGRAM: Access Code: [facility] URL: [email or web address] Date: 03/29/2021 Prepared by: Vanessa Lusby   Exercises Abdominal Isometric Hold - FEET OFF TABLE* - 1 x daily - 7 x weekly - 3 sets - 30-sec hold Supine Bridge - 1 x daily - 7 x weekly - 3 sets - 10 reps - 3-sec hold Sidelying Hip Abduction - 1 x daily - 7 x weekly - 2 sets - 10 reps - 3-sec hold Modified Thomas Stretch - 1 x daily - 7 x weekly - 2 sets - 1-min hold     ASSESSMENT:   CLINICAL IMPRESSION: Pt responded excellently to all exercises today, reporting no pain and moderate fatigue and muscle burn with performed interventions. She leaves with decreased pain to 4/10. She will continue to benefit from skilled PT to address her primary impairments and return to her prior level of function with less limitation.   REHAB POTENTIAL: Good   CLINICAL DECISION MAKING: Stable/uncomplicated   EVALUATION COMPLEXITY: Low     GOALS: Goals reviewed with patient? Yes   SHORT TERM GOALS:   STG Name Target Date Goal status  1 Pt will report understanding and adherence to her HEP in order to promote independence in the management of her primary impairments. Baseline: HEP provided at eval 04/26/2021 INITIAL    LONG TERM GOALS:    LTG Name Target Date Goal status  1 Pt will achieve a FOTO score of 68% in order to demonstrate improved functional ability as it relates to her LBP. Baseline: 63%  05/24/2021 INITIAL  2 Pt will achieve BIL global hip strength of 4+/5 or greater in order to return to working out with less limitation. Baseline: See MMT chart 05/24/2021 INITIAL  3 Pt will report ability to sit >1 hour with 0-2/10 pain in order to work through the day with less limitation. Baseline: >5/10 pain after 30 minutes of sitting. 05/24/2021 INITIAL  4 Pt will achieve lumbar flexion AROM of >60 degrees with 0-2/10 pain in order to get dressed with less limitation. Baseline: 45d with pain 05/24/2021 INITIAL    PLAN: PT FREQUENCY: 1x/week   PT DURATION: 8 weeks   PLANNED INTERVENTIONS: Therapeutic exercises, Therapeutic activity, Neuro Muscular re-education, Balance training, Gait training, Patient/Family education, Joint mobilization, Stair training, Dry Needling, Electrical stimulation, Spinal mobilization, Cryotherapy, Moist heat, Taping, and Manual therapy   PLAN FOR NEXT SESSION: Progress early hip/ core strengthening    Vanessa De Leon, PT, DPT 04/26/21 2:29 PM

## 2021-04-26 ENCOUNTER — Other Ambulatory Visit: Payer: Self-pay

## 2021-04-26 ENCOUNTER — Ambulatory Visit: Payer: 59 | Attending: Orthopaedic Surgery

## 2021-04-26 DIAGNOSIS — G8929 Other chronic pain: Secondary | ICD-10-CM | POA: Insufficient documentation

## 2021-04-26 DIAGNOSIS — M25552 Pain in left hip: Secondary | ICD-10-CM | POA: Insufficient documentation

## 2021-04-26 DIAGNOSIS — M6281 Muscle weakness (generalized): Secondary | ICD-10-CM | POA: Insufficient documentation

## 2021-04-26 DIAGNOSIS — M25551 Pain in right hip: Secondary | ICD-10-CM | POA: Insufficient documentation

## 2021-04-26 DIAGNOSIS — M5440 Lumbago with sciatica, unspecified side: Secondary | ICD-10-CM | POA: Diagnosis not present

## 2021-04-29 ENCOUNTER — Ambulatory Visit (INDEPENDENT_AMBULATORY_CARE_PROVIDER_SITE_OTHER): Payer: 59 | Admitting: Physical Medicine and Rehabilitation

## 2021-04-29 ENCOUNTER — Ambulatory Visit: Payer: Self-pay

## 2021-04-29 ENCOUNTER — Other Ambulatory Visit: Payer: Self-pay

## 2021-04-29 ENCOUNTER — Encounter: Payer: Self-pay | Admitting: Physical Medicine and Rehabilitation

## 2021-04-29 VITALS — BP 136/92 | HR 100

## 2021-04-29 DIAGNOSIS — M47816 Spondylosis without myelopathy or radiculopathy, lumbar region: Secondary | ICD-10-CM

## 2021-04-29 MED ORDER — BUPIVACAINE HCL 0.5 % IJ SOLN: 3.0000 mL | Freq: Once | INTRAMUSCULAR | Status: DC

## 2021-04-29 NOTE — Progress Notes (Signed)
Pt state lower back pain that travels down both legs, mostly on her left leg. Pt state any movement can cause the pain to be worse. Pt state she takes pain meds and goes to PT to help ease her pain.  Numeric Pain Rating Scale and Functional Assessment Average Pain 2   In the last MONTH (on 0-10 scale) has pain interfered with the following?  1. General activity like being  able to carry out your everyday physical activities such as walking, climbing stairs, carrying groceries, or moving a chair?  Rating(10)   +Driver, -BT, -Dye Allergies.

## 2021-04-29 NOTE — Patient Instructions (Signed)

## 2021-05-02 ENCOUNTER — Ambulatory Visit (INDEPENDENT_AMBULATORY_CARE_PROVIDER_SITE_OTHER): Payer: 59 | Admitting: Orthopaedic Surgery

## 2021-05-02 ENCOUNTER — Encounter: Payer: Self-pay | Admitting: Orthopaedic Surgery

## 2021-05-02 DIAGNOSIS — G8929 Other chronic pain: Secondary | ICD-10-CM | POA: Diagnosis not present

## 2021-05-02 DIAGNOSIS — M5442 Lumbago with sciatica, left side: Secondary | ICD-10-CM

## 2021-05-02 DIAGNOSIS — M5441 Lumbago with sciatica, right side: Secondary | ICD-10-CM

## 2021-05-02 MED ORDER — TIZANIDINE HCL 4 MG PO TABS: 4.0000 mg | ORAL_TABLET | Freq: Three times a day (TID) | ORAL | 0 refills | Status: DC | PRN

## 2021-05-02 MED ORDER — DICLOFENAC SODIUM 75 MG PO TBEC: 75.0000 mg | DELAYED_RELEASE_TABLET | Freq: Two times a day (BID) | ORAL | 3 refills | Status: DC | PRN

## 2021-05-02 NOTE — Progress Notes (Signed)
The patient is somewhat recently sent to Dr. Ernestina Patches for lumbar spine injections.  These were only done 4 days ago.  She is sore from the injection but said that she already has decrease in the radiating pain that she had had.  She feels like she is already making progress. ? ?On exam she has negative straight leg raise bilaterally.  She is 56 years old and walking without any assistive device.  There is no weakness in her legs.  There is no numbness and tingling in her legs. ? ?She will continue increase her activities as comfort allows.  I will refill her muscle relaxant and add diclofenac as an anti-inflammatory.  We will see her back in 4 weeks to see how she is doing overall.  She has been in therapy as well. ?

## 2021-05-09 ENCOUNTER — Ambulatory Visit: Payer: 59 | Admitting: Family Medicine

## 2021-05-09 ENCOUNTER — Other Ambulatory Visit: Payer: Self-pay

## 2021-05-09 NOTE — Therapy (Incomplete)
OUTPATIENT PHYSICAL THERAPY TREATMENT NOTE   Patient Name: [name] MRN: [id number] DOB:10-16-1965, 56 y.o., female Today's Date: 05/09/2021  PCP: Haydee Salter, MD REFERRING PROVIDER: Haydee Salter, MD     Past Medical History:  Diagnosis Date   Abnormal uterine bleeding (AUB)    Arthritis    Diabetes mellitus    Elevated cholesterol    Endometrial polyp    Endometrial polyp    Endometriosis    Hypertension    Migraines    Nodule    Ovarian cyst    Past Surgical History:  Procedure Laterality Date   COMBINED HYSTEROSCOPY DIAGNOSTIC / D&C  yrs ago   [place]   with laser adhesions   DILITATION & CURRETTAGE/HYSTROSCOPY WITH NOVASURE ABLATION N/A 11/04/2019   Procedure: DILATATION & CURETTAGE/HYSTEROSCOPY WITH NOVASURE ABLATION;  Surgeon: Joseph Pierini, MD;  Location: [facility];  Service: Gynecology;  Laterality: N/A;   INTRAUTERINE DEVICE INSERTION     mirena-Inserted 05-16-14   KNEE SURGERY Left yrs ago   meniscurs tear repair   mirena removed  2021   OOPHORECTOMY  2008   left   ROTATOR CUFF REPAIR Right yrs ago   WRIST SURGERY Right    gang. cyst   Patient Active Problem List   Diagnosis Date Noted   Depression with anxiety 09/27/2020   Migraine headache 09/27/2020   Insomnia 09/27/2020   Thyroid nodule 09/27/2020   Obesity (BMI 30.0-34.9) 03/09/2019   Acute right-sided low back pain with right-sided sciatica 06/10/2017   Trochanteric bursitis, right hip 05/27/2017   Atypical chest pain 11/18/2012   Type 2 diabetes mellitus ([place]) 11/18/2012   Essential hypertension 11/18/2012   Hyperlipidemia     REFERRING DIAG: M54.42,M54.41,G89.29 (ICD-10-CM) - Chronic bilateral low back pain with bilateral sciatica  THERAPY DIAG:  No diagnosis found.   SUBJECTIVE: ***  PAIN:  Are you having pain? Yes NPRS scale: 6/10 Pain location: Low back Pain orientation: Bilateral  PAIN TYPE: dull and  shooting Aggravating factors: prolonged sitting >30 minutes, prolonged standing/ walking >1 hour, and bending forward Relieving factors: positional change, once weekly exercise class, pain medications     OBJECTIVE:   *Unless otherwise noted, objective measures collected previously*  DIAGNOSTIC FINDINGS:  03/02/2021: MR Lumbar Spine WO Contrast: IMPRESSION: 1. Mild degenerative disc bulging and facet hypertrophy at L2-3 through L4-5 with resultant mild bilateral L2 through L4 foraminal stenosis. Mild spinal stenosis at the L3-4 level. 2. Mild to moderate multilevel facet hypertrophy throughout the lumbar spine, most pronounced at L4-5 bilaterally. Findings could contribute to underlying back pain.   02/13/2021: XR Bilateral hips 2 views: IMPRESSION: Compared to films 2019 there is no significant  interval change.  Mild arthritis of both hips with periarticular spurring  proximal acetabulum.  No acute fractures.  Both hips well located.   PATIENT SURVEYS:  FOTO 63%, 68%   SCREENING FOR RED FLAGS: Bowel or bladder incontinence: No Cauda equina syndrome: No   COGNITION:          Overall cognitive status: Within functional limits for tasks assessed                        SENSATION:          Light touch: Appears intact             MUSCLE LENGTH: Thomas test: (+) BIL, severe limitation   POSTURE:  Decreased lumbar lordosis   PALPATION: TTP to  lower lumbar paraspinals/ QL, exquisite TTP to Lt trochanter   PASSIVE ACCESSORIES: Painful and hypomobile CPAs from L3-S1   LUMBAR AROM   AROM AROM  03/29/2021  Flexion 45 with p! on return to standing  Extension 5p!  Right lateral flexion 12  Left lateral flexion 15p!  Right rotation 40  Left rotation 50p!   (Blank rows = not tested)   LE AROM/PROM:   A/PROM Right 03/29/2021 Left 03/29/2021  Hip flexion 110/120 100/ 105 with lateral hip pain  Hip extension 15/20 5/10  Hip abduction 45/55 30p!/ 35p!  Hip adduction  24/30 5p!/ 10p!  Hip internal rotation 38 30p!  Hip external rotation 35 20      LE MMT:   MMT Right 03/29/2021 Left 03/29/2021  Hip flexion 5/5 4/5  Hip extension 3+/5 3/5p!  Hip abduction 3+/5 3/5p!  Hip internal rotation 5/5 5/5  Hip external rotation 4+/5 4/5p!   (Blank rows = not tested)   LUMBAR SPECIAL TESTS:  Slump test: (-) BIL SLR: (-) BIL ASLR: (-) BIL Repeated lumbar extension: (+) for centralization of LBP Hip scour: (-) BIL   FUNCTIONAL TESTS:  Not performed at this visit       TODAY'S TREATMENT   [facility] Adult PT Treatment:                                                DATE: 05/10/2021 Therapeutic Exercise: *** Manual Therapy: *** Neuromuscular re-ed: *** Therapeutic Activity: *** Modalities: *** Self Care: ***   [facility] Adult PT Treatment:                                                DATE: 04/26/2021 Therapeutic Exercise: NuStep level 6 x5 minutes while collecting subjective Kickstand stance Pallof press with 3# cable into hip ER and IR 2x10 with 5-sec hold each BIL Supine dead bugs 3x20 Prone with hips on Bosu ball and PT holding legs, trunk extension to neutral 3x10 Standing Cybex hip abduction with 25# cable 2x10 BIL Standing Cybex hip extension with 25# cable 2x10 BIL Manual Therapy: N/A Neuromuscular re-ed: N/A Therapeutic Activity: N/A Modalities: N/A Self Care: N/A   [facility] Adult PT Treatment:                                                DATE: 03/29/2021 Therapeutic Exercise: Glute bridge x10 Sidelying hip abduction x10 BIL Modified Thomas stretch x1 min BIL Supine 90/90 abdominal isometric with handhold resistance x30 seconds Manual Therapy: N/A Neuromuscular re-ed: N/A Therapeutic Activity: N/A Modalities: N/A Self Care: N/A       PATIENT EDUCATION:  Education details: Educated on probable underlying pathophysiology behind her pain presentation, POC, prognosis, and HEP Person educated: Patient Education method:  Consulting civil engineer, Media planner, and Handouts Education comprehension: verbalized understanding and returned demonstration     HOME EXERCISE PROGRAM: Access Code: [email or web address] URL: [email or web address] Date: 03/29/2021 Prepared by: Vanessa Covina   Exercises Abdominal Isometric Hold - FEET OFF TABLE* - 1 x daily - 7 x weekly - 3 sets - 30-sec hold Supine Bridge - 1 x daily -  7 x weekly - 3 sets - 10 reps - 3-sec hold Sidelying Hip Abduction - 1 x daily - 7 x weekly - 2 sets - 10 reps - 3-sec hold Modified Thomas Stretch - 1 x daily - 7 x weekly - 2 sets - 1-min hold     ASSESSMENT:   CLINICAL IMPRESSION: ***   REHAB POTENTIAL: Good   CLINICAL DECISION MAKING: Stable/uncomplicated   EVALUATION COMPLEXITY: Low     GOALS: Goals reviewed with patient? Yes   SHORT TERM GOALS:   STG Name Target Date Goal status  1 Pt will report understanding and adherence to her HEP in order to promote independence in the management of her primary impairments. Baseline: HEP provided at eval 04/26/2021 INITIAL    LONG TERM GOALS:    LTG Name Target Date Goal status  1 Pt will achieve a FOTO score of 68% in order to demonstrate improved functional ability as it relates to her LBP. Baseline: 63% 05/24/2021 INITIAL  2 Pt will achieve BIL global hip strength of 4+/5 or greater in order to return to working out with less limitation. Baseline: See MMT chart 05/24/2021 INITIAL  3 Pt will report ability to sit >1 hour with 0-2/10 pain in order to work through the day with less limitation. Baseline: >5/10 pain after 30 minutes of sitting. 05/24/2021 INITIAL  4 Pt will achieve lumbar flexion AROM of >60 degrees with 0-2/10 pain in order to get dressed with less limitation. Baseline: 45d with pain 05/24/2021 INITIAL    PLAN: PT FREQUENCY: 1x/week   PT DURATION: 8 weeks   PLANNED INTERVENTIONS: Therapeutic exercises, Therapeutic activity, Neuro Muscular re-education, Balance training, Gait  training, Patient/Family education, Joint mobilization, Stair training, Dry Needling, Electrical stimulation, Spinal mobilization, Cryotherapy, Moist heat, Taping, and Manual therapy   PLAN FOR NEXT SESSION: Progress early hip/ core strengthening    Vanessa Camas, PT, DPT 05/09/21 1:51 PM

## 2021-05-10 ENCOUNTER — Ambulatory Visit: Payer: 59

## 2021-05-10 ENCOUNTER — Other Ambulatory Visit: Payer: 59

## 2021-05-12 NOTE — Progress Notes (Signed)
[name] - 56 y.o. female MRN [id number]  Date of birth: [birth year]  Office Visit Note: Visit Date: 04/29/2021 PCP: Haydee Salter, MD Referred by: Haydee Salter, MD  Subjective: Chief Complaint  Patient presents with   Lower Back - Pain   Right Leg - Pain   Left Leg - Pain   HPI:  [name] is a 56 y.o. female who comes in today at the request of Dr. Jean Rosenthal for planned Bilateral  L4-5 Lumbar facet/medial branch block with fluoroscopic guidance.  The patient has failed conservative care including home exercise, medications, time and activity modification.  This injection will be diagnostic and hopefully therapeutic.  Please see requesting physician notes for further details and justification.  Exam has shown concordant pain with facet joint loading. MRI reviewed with images and spine model.  MRI reviewed in the note below.  She does have some referral pain in the legs which does not really fit with her MRI.  This could just be referral pattern from the facet joints.  She does have type 2 diabetes.  MRI mainly shows facet arthropathy particular at L4-5.  Findings overall not severe.  Depending on how she does would look at diagnostic medial branch block as a double block paradigm.  ROS Otherwise per HPI.  Assessment & Plan: Visit Diagnoses:    ICD-10-CM   1. Spondylosis without myelopathy or radiculopathy, lumbar region  M47.816 XR C-ARM NO REPORT    Facet Injection    DISCONTINUED: bupivacaine (MARCAINE) 0.5 % (with pres) injection 3 mL      Plan: No additional findings.   Meds & Orders:  Meds ordered this encounter  Medications   DISCONTD: bupivacaine (MARCAINE) 0.5 % (with pres) injection 3 mL    Orders Placed This Encounter  Procedures   Facet Injection   XR C-ARM NO REPORT    Follow-up: Return for visit to requesting provider as needed.   Procedures: No procedures performed  Lumbar Facet Joint Intra-Articular Injection(s)  with Fluoroscopic Guidance  Patient: [name]      Date of Birth: 12/04/1965 MRN: [id number] PCP: Haydee Salter, MD      Visit Date: 04/29/2021   Universal Protocol:    Date/Time: 04/29/2021  Consent Given By: the patient  Position: PRONE   Additional Comments: Vital signs were monitored before and after the procedure. Patient was prepped and draped in the usual sterile fashion. The correct patient, procedure, and site was verified.   Injection Procedure Details:  Procedure Site One Meds Administered:  Meds ordered this encounter  Medications   bupivacaine (MARCAINE) 0.5 % (with pres) injection 3 mL     Laterality: Bilateral  Location/Site:  L4-L5  Needle size: 22 guage  Needle type: Spinal  Needle Placement: Articular  Findings:  -Comments: Excellent flow of contrast producing a partial arthrogram.  Procedure Details: The fluoroscope beam is vertically oriented in AP, and the inferior recess is visualized beneath the lower pole of the inferior apophyseal process, which represents the target point for needle insertion. When direct visualization is difficult the target point is located at the medial projection of the vertebral pedicle. The region overlying each aforementioned target is locally anesthetized with a 1 to 2 ml. volume of 1% Lidocaine without Epinephrine.   The spinal needle was inserted into each of the above mentioned facet joints using biplanar fluoroscopic guidance. A 0.25 to 0.5 ml. volume of Isovue-250 was injected and a partial facet joint arthrogram  was obtained. A single spot film was obtained of the resulting arthrogram.    One to 1.25 ml of the steroid/anesthetic solution was then injected into each of the facet joints noted above.   Additional Comments:  No complications occurred Dressing: 2 x 2 sterile gauze and Band-Aid    Post-procedure details: Patient was observed during the procedure. Post-procedure instructions were  reviewed.  Patient left the clinic in stable condition.     Clinical History: MRI LUMBAR SPINE WITHOUT CONTRAST   TECHNIQUE: Multiplanar, multisequence MR imaging of the lumbar spine was performed. No intravenous contrast was administered.   COMPARISON:  Comparison made with prior radiograph from 02/13/2021 as well as previous MRI from 02/27/2012.   FINDINGS: Segmentation: Standard. Lowest well-formed disc space labeled the L5-S1 level.   Alignment: Trace facet mediated anterolisthesis of L4 on L5. Alignment otherwise normal preservation of the normal lumbar lordosis.   Vertebrae: Vertebral body height well maintained without acute or chronic fracture. Bone marrow signal intensity within normal limits. No discrete or worrisome osseous lesions. Mild reactive marrow edema present about the L4-5 facets due to facet arthritis. No other abnormal marrow edema.   Conus medullaris and cauda equina: Conus extends to the L1 level. Conus and cauda equina appear normal.   Paraspinal and other soft tissues: Unremarkable.   Disc levels:   L1-2:  Unremarkable.   L2-3: Mild circumferential disc bulge with disc desiccation and intervertebral disc space narrowing. Minimal facet hypertrophy. No spinal stenosis. Mild bilateral L2 foraminal narrowing, slightly worse on the right.   L3-4: Disc desiccation with mild circumferential disc bulge and intervertebral disc space narrowing. Mild-to-moderate bilateral facet hypertrophy. Resultant mild spinal stenosis. Mild right worse than left L3 foraminal narrowing.   L4-5: Trace anterolisthesis. Mild disc bulge with disc desiccation. Moderate bilateral facet hypertrophy. No significant spinal stenosis. Mild bilateral L4 foraminal narrowing.   L5-S1: Negative interspace. Moderate left with mild right facet hypertrophy. No spinal stenosis. Foramina remain patent.   IMPRESSION: 1. Mild degenerative disc bulging and facet hypertrophy at  L2-3 through L4-5 with resultant mild bilateral L2 through L4 foraminal stenosis. Mild spinal stenosis at the L3-4 level. 2. Mild to moderate multilevel facet hypertrophy throughout the lumbar spine, most pronounced at L4-5 bilaterally. Findings could contribute to underlying back pain.     Electronically Signed   By: Jeannine Boga M.D.   On: 03/05/2021 02:45     Objective:  VS:  HT:     WT:    BMI:      BP:(!) 136/92   HR:100bpm   TEMP: ( )   RESP:  Physical Exam Vitals and nursing note reviewed.  Constitutional:      General: She is not in acute distress.    Appearance: Normal appearance. She is not ill-appearing.  HENT:     Head: Normocephalic and atraumatic.     Right Ear: External ear normal.     Left Ear: External ear normal.  Eyes:     Extraocular Movements: Extraocular movements intact.  Cardiovascular:     Rate and Rhythm: Normal rate.     Pulses: Normal pulses.  Pulmonary:     Effort: Pulmonary effort is normal. No respiratory distress.  Abdominal:     General: There is no distension.     Palpations: Abdomen is soft.  Musculoskeletal:        General: Tenderness present.     Cervical back: Neck supple.     Right lower leg: No edema.  Left lower leg: No edema.     Comments: Patient has good distal strength with no pain over the greater trochanters.  No clonus or focal weakness. Patient somewhat slow to rise from a seated position to full extension.  There is concordant low back pain with facet loading and lumbar spine extension rotation.  There are no definitive trigger points but the patient is somewhat tender across the lower back and PSIS.  There is no pain with hip rotation.   Skin:    Findings: No erythema, lesion or rash.  Neurological:     General: No focal deficit present.     Mental Status: She is alert and oriented to person, place, and time.     Sensory: No sensory deficit.     Motor: No weakness or abnormal muscle tone.     Coordination:  Coordination normal.  Psychiatric:        Mood and Affect: Mood normal.        Behavior: Behavior normal.     Imaging: No results found.

## 2021-05-12 NOTE — Procedures (Signed)
Lumbar Facet Joint Intra-Articular Injection(s) with Fluoroscopic Guidance  Patient: [name]      Date of Birth: Apr 21, 1965 MRN: [id number] PCP: Haydee Salter, MD      Visit Date: 04/29/2021   Universal Protocol:    Date/Time: 04/29/2021  Consent Given By: the patient  Position: PRONE   Additional Comments: Vital signs were monitored before and after the procedure. Patient was prepped and draped in the usual sterile fashion. The correct patient, procedure, and site was verified.   Injection Procedure Details:  Procedure Site One Meds Administered:  Meds ordered this encounter  Medications   bupivacaine (MARCAINE) 0.5 % (with pres) injection 3 mL     Laterality: Bilateral  Location/Site:  L4-L5  Needle size: 22 guage  Needle type: Spinal  Needle Placement: Articular  Findings:  -Comments: Excellent flow of contrast producing a partial arthrogram.  Procedure Details: The fluoroscope beam is vertically oriented in AP, and the inferior recess is visualized beneath the lower pole of the inferior apophyseal process, which represents the target point for needle insertion. When direct visualization is difficult the target point is located at the medial projection of the vertebral pedicle. The region overlying each aforementioned target is locally anesthetized with a 1 to 2 ml. volume of 1% Lidocaine without Epinephrine.   The spinal needle was inserted into each of the above mentioned facet joints using biplanar fluoroscopic guidance. A 0.25 to 0.5 ml. volume of Isovue-250 was injected and a partial facet joint arthrogram was obtained. A single spot film was obtained of the resulting arthrogram.    One to 1.25 ml of the steroid/anesthetic solution was then injected into each of the facet joints noted above.   Additional Comments:  No complications occurred Dressing: 2 x 2 sterile gauze and Band-Aid    Post-procedure details: Patient was observed during  the procedure. Post-procedure instructions were reviewed.  Patient left the clinic in stable condition.

## 2021-05-13 ENCOUNTER — Other Ambulatory Visit: Payer: Self-pay

## 2021-05-13 ENCOUNTER — Ambulatory Visit (INDEPENDENT_AMBULATORY_CARE_PROVIDER_SITE_OTHER): Payer: 59 | Admitting: Family Medicine

## 2021-05-13 VITALS — BP 128/72 | HR 90 | Temp 97.8°F | Ht 59.0 in | Wt 165.8 lb

## 2021-05-13 DIAGNOSIS — J069 Acute upper respiratory infection, unspecified: Secondary | ICD-10-CM | POA: Diagnosis not present

## 2021-05-13 NOTE — Patient Instructions (Signed)
Upper Respiratory Infection, Adult ?An upper respiratory infection (URI) is a common viral infection of the nose, throat, and upper air passages that lead to the lungs. The most common type of URI is the common cold. URIs usually get better on their own, without medical treatment. ?What are the causes? ?A URI is caused by a virus. You may catch a virus by: ?Breathing in droplets from an infected person's cough or sneeze. ?Touching something that has been exposed to the virus (is contaminated) and then touching your mouth, nose, or eyes. ?What increases the risk? ?You are more likely to get a URI if: ?You are very young or very old. ?You have close contact with others, such as at work, school, or a health care facility. ?You smoke. ?You have long-term (chronic) heart or lung disease. ?You have a weakened disease-fighting system (immune system). ?You have nasal allergies or asthma. ?You are experiencing a lot of stress. ?You have poor nutrition. ?What are the signs or symptoms? ?A URI usually involves some of the following symptoms: ?Runny or stuffy (congested) nose. ?Cough. ?Sneezing. ?Sore throat. ?Headache. ?Fatigue. ?Fever. ?Loss of appetite. ?Pain in your forehead, behind your eyes, and over your cheekbones (sinus pain). ?Muscle aches. ?Redness or irritation of the eyes. ?Pressure in the ears or face. ?How is this diagnosed? ?This condition may be diagnosed based on your medical history and symptoms, and a physical exam. Your health care provider may use a swab to take a mucus sample from your nose (nasal swab). This sample can be tested to determine what virus is causing the illness. ?How is this treated? ?URIs usually get better on their own within 7-10 days. Medicines cannot cure URIs, but your health care provider may recommend certain medicines to help relieve symptoms, such as: ?Over-the-counter cold medicines. ?Cough suppressants. Coughing is a type of defense against infection that helps to clear the  respiratory system, so take these medicines only as recommended by your health care provider. ?Fever-reducing medicines. ?Follow these instructions at home: ?Activity ?Rest as needed. ?If you have a fever, stay home from work or school until your fever is gone or until your health care provider says your URI cannot spread to other people (is no longer contagious). Your health care provider may have you wear a face mask to prevent your infection from spreading. ?Relieving symptoms ?Gargle with a mixture of salt and water 3-4 times a day or as needed. To make salt water, completely dissolve ?-1 tsp (3-6 g) of salt in 1 cup (237 mL) of warm water. ?Use a cool-mist humidifier to add moisture to the air. This can help you breathe more easily. ?Eating and drinking ? ?Drink enough fluid to keep your urine pale yellow. ?Eat soups and other clear broths. ?General instructions ? ?Take over-the-counter and prescription medicines only as told by your health care provider. These include cold medicines, fever reducers, and cough suppressants. ?Do not use any products that contain nicotine or tobacco. These products include cigarettes, chewing tobacco, and vaping devices, such as e-cigarettes. If you need help quitting, ask your health care provider. ?Stay away from secondhand smoke. ?Stay up to date on all immunizations, including the yearly (annual) flu vaccine. ?Keep all follow-up visits. This is important. ?How to prevent the spread of infection to others ?URIs can be contagious. To prevent the infection from spreading: ?Wash your hands with soap and water for at least 20 seconds. If soap and water are not available, use hand sanitizer. ?Avoid touching your mouth,  face, eyes, or nose. ?Cough or sneeze into a tissue or your sleeve or elbow instead of into your hand or into the air. ? ?Contact a health care provider if: ?You are getting worse instead of better. ?You have a fever or chills. ?Your mucus is brown or red. ?You have  yellow or brown discharge coming from your nose. ?You have pain in your face, especially when you bend forward. ?You have swollen neck glands. ?You have pain while swallowing. ?You have white areas in the back of your throat. ?Get help right away if: ?You have shortness of breath that gets worse. ?You have severe or persistent: ?Headache. ?Ear pain. ?Sinus pain. ?Chest pain. ?You have chronic lung disease along with any of the following: ?Making high-pitched whistling sounds when you breathe, most often when you breathe out (wheezing). ?Prolonged cough (more than 14 days). ?Coughing up blood. ?A change in your usual mucus. ?You have a stiff neck. ?You have changes in your: ?Vision. ?Hearing. ?Thinking. ?Mood. ?These symptoms may be an emergency. Get help right away. Call 911. ?Do not wait to see if the symptoms will go away. ?Do not drive yourself to the hospital. ?Summary ?An upper respiratory infection (URI) is a common infection of the nose, throat, and upper air passages that lead to the lungs. ?A URI is caused by a virus. ?URIs usually get better on their own within 7-10 days. ?Medicines cannot cure URIs, but your health care provider may recommend certain medicines to help relieve symptoms. ?This information is not intended to replace advice given to you by your health care provider. Make sure you discuss any questions you have with your health care provider. ?Document Revised: 09/19/2020 Document Reviewed: 09/19/2020 ?Elsevier Patient Education ? [place]. ? ?

## 2021-05-13 NOTE — Progress Notes (Signed)
?[facility] ?[place] [address] ?Dept: [phone] ?Dept Fax: [phone] ? ?Office Visit ? ?Subjective:  ? ? Patient ID: [name], female    DOB: 29-Feb-1966, 56 y.o..   MRN: [id number] ? ?Chief Complaint  ?Patient presents with  ? Acute Visit  ?  C/o having ST, head congestion x 3 days.   She has been taking Nyquil, Dayquil with little relief.   ? ? ?History of Present Illness: ? ?Patient is in today for a 3-day history of sore throat, a burning sensation in her right nostril, and nasal congestion. She has had mild cough. Denies nausea/vomiting or fever. She has been taking Nyquil/Dayquil. She did not test for COVID. ? ?Past Medical History: ?Patient Active Problem List  ? Diagnosis Date Noted  ? Depression with anxiety 09/27/2020  ? Migraine headache 09/27/2020  ? Insomnia 09/27/2020  ? Thyroid nodule 09/27/2020  ? Obesity (BMI 30.0-34.9) 03/09/2019  ? Acute right-sided low back pain with right-sided sciatica 06/10/2017  ? Trochanteric bursitis, right hip 05/27/2017  ? Atypical chest pain 11/18/2012  ? Type 2 diabetes mellitus ([place]) 11/18/2012  ? Essential hypertension 11/18/2012  ? Hyperlipidemia   ? ?Past Surgical History:  ?Procedure Laterality Date  ? COMBINED HYSTEROSCOPY DIAGNOSTIC / D&C  yrs ago  ? DIAGNOSTIC LAPAROSCOPY  1993  ? with laser adhesions  ? DILITATION & CURRETTAGE/HYSTROSCOPY WITH NOVASURE ABLATION N/A 11/04/2019  ? Procedure: DILATATION & CURETTAGE/HYSTEROSCOPY WITH NOVASURE ABLATION;  Surgeon: Joseph Pierini, MD;  Location: [facility];  Service: Gynecology;  Laterality: N/A;  ? INTRAUTERINE DEVICE INSERTION    ? mirena-Inserted 05-16-14  ? KNEE SURGERY Left yrs ago  ? meniscurs tear repair  ? mirena removed  2021  ? OOPHORECTOMY  2008  ? left  ? ROTATOR CUFF REPAIR Right yrs ago  ? WRIST SURGERY Right   ? gang. cyst  ? ?Family History  ?Problem Relation Age of Onset  ? Hypertension Mother    ? Diabetes Mother   ? COPD Mother   ? Cancer Mother   ?     Lung  ? Hypertension Father   ? Stroke Father 51  ? Diabetes Sister   ? Hypertension Brother   ? Diabetes Maternal Aunt   ? Cancer Maternal Aunt   ?     Kidney  ? Kidney disease Maternal Aunt   ? Diabetes Maternal Aunt   ? Stroke Maternal Grandfather   ? Diabetes Maternal Grandfather   ? Colon cancer Neg Hx   ? Esophageal cancer Neg Hx   ? Rectal cancer Neg Hx   ? Stomach cancer Neg Hx   ? ?Outpatient Medications Prior to Visit  ?Medication Sig Dispense Refill  ? acetaminophen (TYLENOL) 500 MG tablet Take 2 tablets (1,000 mg total) by mouth every 6 (six) hours as needed for mild pain.    ? Ascorbic Acid (VITAMIN C) 1000 MG tablet Take 1,000 mg by mouth daily.    ? cholecalciferol (VITAMIN D3) 25 MCG (1000 UNIT) tablet Take 1,000 Units by mouth daily.    ? clonazePAM (KLONOPIN) 0.5 MG tablet TAKE 1 TABLET BY MOUTH 2 TIMES DAILY AS NEEDED FOR ANXIETY. 30 tablet 1  ? diclofenac (VOLTAREN) 75 MG EC tablet Take 1 tablet (75 mg total) by mouth 2 (two) times daily between meals as needed. 60 tablet 3  ? gabapentin (NEURONTIN) 100 MG capsule Take 1 capsule (100 mg total) by mouth 3 (three) times daily as  needed. 60 capsule 1  ? JANUVIA 100 MG tablet TAKE 1 TABLET BY MOUTH DAILY AFTER BREAKFAST. 90 tablet 3  ? nabumetone (RELAFEN) 500 MG tablet Take 1 tablet (500 mg total) by mouth 2 (two) times daily as needed. 60 tablet 2  ? olmesartan-hydrochlorothiazide (BENICAR HCT) 40-12.5 MG tablet TAKE 1 TABLET BY MOUTH EVERY DAY 90 tablet 3  ? PARoxetine (PAXIL) 20 MG tablet Take 1 tablet (20 mg total) by mouth daily. 90 tablet 3  ? rizatriptan (MAXALT-MLT) 10 MG disintegrating tablet TAKE 1 TABLET BY MOUTH AS NEEDED FOR MIGRAINE. MAY REPEAT IN 2 HOURS IF NEEDED 10 tablet 6  ? rosuvastatin (CRESTOR) 10 MG tablet TAKE 1 TABLET BY MOUTH EVERY DAY WITH BREAKFAST 90 tablet 3  ? tiZANidine (ZANAFLEX) 4 MG tablet Take 1 tablet (4 mg total) by mouth every 8 (eight) hours as  needed for muscle spasms. 40 tablet 0  ? traMADol (ULTRAM) 50 MG tablet Take 1 tablet (50 mg total) by mouth every 6 (six) hours as needed. 30 tablet 0  ? zinc gluconate 50 MG tablet Take 50 mg by mouth daily.    ? diclofenac sodium (VOLTAREN) 1 % GEL APPLY 4 GRAMS TOPICALLY 4 (FOUR) TIMES DAILY. RIGHT POSTERIOR TIBIAL TENDON 900 g 0  ? ?No facility-administered medications prior to visit.  ? ?Allergies  ?Allergen Reactions  ? Dilaudid [Hydromorphone Hcl] Other (See Comments)  ?  Broke in sweat and started shaking, can take oral  ? Sulfa Antibiotics Hives  ? Tylox [Oxycodone-Acetaminophen] Nausea And Vomiting  ? Clindamycin/Lincomycin Rash  ?  hives  ? ?Objective:  ? ?Today's Vitals  ? 05/13/21 1339  ?BP: 128/72  ?Pulse: 90  ?Temp: 97.8 ?F (36.6 ?C)  ?TempSrc: Temporal  ?SpO2: 95%  ?Weight: 165 lb 12.8 oz (75.2 kg)  ?Height: '4\' 11"'$  (1.499 m)  ? ?Body mass index is 33.49 kg/m?.  ? ?General: Well developed, well nourished. No acute distress. ?HEENT: Normocephalic, non-traumatic. Conjunctiva clear. External ears normal. EAC and TMs  ? normal bilaterally. Nose clear without congestion or rhinorrhea. Mucous membranes moist.  ? Oropharynx clear. Good dentition. ?Neck: Supple. No lymphadenopathy. No thyromegaly. ?Lungs: Clear to auscultation bilaterally. No wheezing, rales or rhonchi. ?CV: RRR without murmurs or rubs. Pulses 2+ bilaterally. ?Psych: Alert and oriented. Normal mood and affect. ? ?Health Maintenance Due  ?Topic Date Due  ? Zoster Vaccines- Shingrix (2 of 2) 12/29/2020  ? OPHTHALMOLOGY EXAM  01/31/2021  ?   ?Assessment & Plan:  ? ?1. Viral URI with cough ?Discussed home care for viral illness, including rest, pushing fluids, and OTC medications as needed for symptom relief. Recommend hot tea with honey for sore throat symptoms. Follow-up if needed for worsening or persistent symptoms. RTW on 3/15. ? ?Return if symptoms worsen or fail to improve.  ? ?Haydee Salter, MD ?

## 2021-05-16 NOTE — Therapy (Signed)
?OUTPATIENT PHYSICAL THERAPY TREATMENT NOTE ? ? ?Patient Name: [name] ?MRN: [id number] ?DOB:04/30/1965, 56 y.o., female ?Today's Date: 05/17/2021 ? ?PCP: Haydee Salter, MD ?REFERRING PROVIDER: Haydee Salter, MD ? ? PT End of Session - 05/17/21 1354   ? ? Visit Number 3   ? Number of Visits 9   ? Date for PT Re-Evaluation 05/24/21   ? Authorization Type [employer]   ? PT Start Time 1355   Pt arrived 10 minutes late  ? PT Stop Time 1430   ? PT Time Calculation (min) 35 min   ? Activity Tolerance Patient tolerated treatment well   ? Behavior During Therapy [facility] for tasks assessed/performed   ? ?  ?  ? ?  ? ? ? ?Past Medical History:  ?Diagnosis Date  ? Abnormal uterine bleeding (AUB)   ? Arthritis   ? Diabetes mellitus   ? Elevated cholesterol   ? Endometrial polyp   ? Endometrial polyp   ? Endometriosis   ? Hypertension   ? Migraines   ? Nodule   ? Ovarian cyst   ? ?Past Surgical History:  ?Procedure Laterality Date  ? COMBINED HYSTEROSCOPY DIAGNOSTIC / D&C  yrs ago  ? DIAGNOSTIC LAPAROSCOPY  1993  ? with laser adhesions  ? DILITATION & CURRETTAGE/HYSTROSCOPY WITH NOVASURE ABLATION N/A 11/04/2019  ? Procedure: DILATATION & CURETTAGE/HYSTEROSCOPY WITH NOVASURE ABLATION;  Surgeon: Joseph Pierini, MD;  Location: [facility];  Service: Gynecology;  Laterality: N/A;  ? INTRAUTERINE DEVICE INSERTION    ? mirena-Inserted 05-16-14  ? KNEE SURGERY Left yrs ago  ? meniscurs tear repair  ? mirena removed  2021  ? OOPHORECTOMY  2008  ? left  ? ROTATOR CUFF REPAIR Right yrs ago  ? WRIST SURGERY Right   ? gang. cyst  ? ?Patient Active Problem List  ? Diagnosis Date Noted  ? Depression with anxiety 09/27/2020  ? Migraine headache 09/27/2020  ? Insomnia 09/27/2020  ? Thyroid nodule 09/27/2020  ? Obesity (BMI 30.0-34.9) 03/09/2019  ? Acute right-sided low back pain with right-sided sciatica 06/10/2017  ? Trochanteric bursitis, right hip 05/27/2017  ? Atypical chest pain 11/18/2012  ? Type 2 diabetes  mellitus ([place]) 11/18/2012  ? Essential hypertension 11/18/2012  ? Hyperlipidemia   ? ? ?REFERRING DIAG: M54.42,M54.41,G89.29 (ICD-10-CM) - Chronic bilateral low back pain with bilateral sciatica ? ?THERAPY DIAG:  ?Chronic bilateral low back pain with sciatica, sciatica laterality unspecified ? ?Muscle weakness (generalized) ? ?Pain in left hip ? ?Pain in right hip ? ? ?SUBJECTIVE: Pt reports that her back and hips are feeling a little better and reports that she is feeling better since being sick earlier this week. She reports no pain, only stiffness in her low back, adding that she has no current symptoms in her hips. She also reports that she had two steroid injections about 2 weeks ago in BIL low back and has seen some benefit from this. Pt reports not doing her HEP due to being sick. ? ?PAIN:  ?Are you having pain? Yes ?NPRS scale: 0/10 ?Pain location: Low back ?Pain orientation: Bilateral  ?PAIN TYPE: dull and shooting ?Aggravating factors: prolonged sitting >30 minutes, prolonged standing/ walking >1 hour, and bending forward ?Relieving factors: positional change, once weekly exercise class, pain medications ? ? ? ? ?OBJECTIVE:  ? *Unless otherwise noted, objective measures collected previously* ? ?DIAGNOSTIC FINDINGS:  ?03/02/2021: MR Lumbar Spine WO Contrast: IMPRESSION: ?1. Mild degenerative disc bulging and facet hypertrophy at L2-3 ?through L4-5  with resultant mild bilateral L2 through L4 foraminal ?stenosis. Mild spinal stenosis at the L3-4 level. ?2. Mild to moderate multilevel facet hypertrophy throughout the ?lumbar spine, most pronounced at L4-5 bilaterally. Findings could ?contribute to underlying back pain. ?  ?02/13/2021: XR Bilateral hips 2 views: IMPRESSION: Compared to films 2019 there is no significant  ?interval change.  Mild arthritis of both hips with periarticular spurring  ?proximal acetabulum.  No acute fractures.  Both hips well located. ?  ?PATIENT SURVEYS:  ?FOTO 63%, 68% ?   ?SCREENING FOR RED FLAGS: ?Bowel or bladder incontinence: No ?Cauda equina syndrome: No ?  ?COGNITION: ?         Overall cognitive status: Within functional limits for tasks assessed              ?          ?SENSATION: ?         Light touch: Appears intact ?          ?  ?MUSCLE LENGTH: ?Thomas test: (+) BIL, severe limitation ?  ?POSTURE:  ?Decreased lumbar lordosis ?  ?PALPATION: ?TTP to lower lumbar paraspinals/ QL, exquisite TTP to Lt trochanter ?  ?PASSIVE ACCESSORIES: ?Painful and hypomobile CPAs from L3-S1 ?  ?LUMBAR AROM ?  ?AROM AROM  ?03/29/2021  ?Flexion 45 with p! on return to standing  ?Extension 5p!  ?Right lateral flexion 12  ?Left lateral flexion 15p!  ?Right rotation 40  ?Left rotation 50p!  ? (Blank rows = not tested) ?  ?LE AROM/PROM: ?  ?A/PROM Right ?03/29/2021 Left ?03/29/2021  ?Hip flexion 110/120 100/ 105 with lateral hip pain  ?Hip extension 15/20 5/10  ?Hip abduction 45/55 30p!/ 35p!  ?Hip adduction 24/30 5p!/ 10p!  ?Hip internal rotation 38 30p!  ?Hip external rotation 35 20  ?  ?  ?LE MMT: ?  ?MMT Right ?03/29/2021 Left ?03/29/2021 Right ?05/17/2021 Left ?05/17/2021  ?Hip flexion 5/5 4/5 5/5 5/5  ?Hip extension 3+/5 3/5p! 4/5 4/5  ?Hip abduction 3+/5 3/5p! 4/5 4/5p!  ?Hip internal rotation 5/5 5/5 5/5 5/5  ?Hip external rotation 4+/5 4/5p! 5/5 5/5  ? (Blank rows = not tested) ?  ?LUMBAR SPECIAL TESTS:  ?Slump test: (-) BIL ?SLR: (-) BIL ?ASLR: (-) BIL ?Repeated lumbar extension: (+) for centralization of LBP ?Hip scour: (-) BIL ?  ?FUNCTIONAL TESTS:  ?Not performed at this visit ?  ?  ?  ?TODAY'S TREATMENT  ? ?[facility] Adult PT Treatment:                                                DATE: 05/17/2021 ?Therapeutic Exercise: ?Supine reverse crunch 3x12 ?Side plank 3x30sec BIL ?Knee plank with alternating hip extension 3x14 ?Tall-kneeling hip thrust on Airex pad with 20# cable 3x10 ?Standing Pallof press with 7# cable x10 with 5-sec hold BIL ?Manual Therapy: ?N/A ?Neuromuscular re-ed: ?N/A ?Therapeutic  Activity: ?N/A ?Modalities: ?N/A ?Self Care: ?N/A ? ? ?[facility] Adult PT Treatment:                                                DATE: 04/26/2021 ?Therapeutic Exercise: ?NuStep level 6 x5 minutes while collecting subjective ?Kickstand stance Pallof press with 3# cable into hip  ER and IR 2x10 with 5-sec hold each BIL ?Supine dead bugs 3x20 ?Prone with hips on Bosu ball and PT holding legs, trunk extension to neutral 3x10 ?Standing Cybex hip abduction with 25# cable 2x10 BIL ?Standing Cybex hip extension with 25# cable 2x10 BIL ?Manual Therapy: ?N/A ?Neuromuscular re-ed: ?N/A ?Therapeutic Activity: ?N/A ?Modalities: ?N/A ?Self Care: ?N/A ? ? ?[facility] Adult PT Treatment:                                                DATE: 03/29/2021 ?Therapeutic Exercise: ?Glute bridge x10 ?Sidelying hip abduction x10 BIL ?Modified Thomas stretch x1 min BIL ?Supine 90/90 abdominal isometric with handhold resistance x30 seconds ?Manual Therapy: ?N/A ?Neuromuscular re-ed: ?N/A ?Therapeutic Activity: ?N/A ?Modalities: ?N/A ?Self Care: ?N/A ?  ?  ?  ?PATIENT EDUCATION:  ?Education details: Educated on probable underlying pathophysiology behind her pain presentation, POC, prognosis, and HEP ?Person educated: Patient ?Education method: Explanation, Demonstration, and Handouts ?Education comprehension: verbalized understanding and returned demonstration ?  ?  ?HOME EXERCISE PROGRAM: ?Access Code: [email or web address] ?URL: [email or web address] ?Date: 03/29/2021 ?Prepared by: Vanessa Clifton ?  ?Exercises ?Abdominal Isometric Hold - FEET OFF TABLE* - 1 x daily - 7 x weekly - 3 sets - 30-sec hold ?Supine Bridge - 1 x daily - 7 x weekly - 3 sets - 10 reps - 3-sec hold ?Sidelying Hip Abduction - 1 x daily - 7 x weekly - 2 sets - 10 reps - 3-sec hold ?Modified Thomas Stretch - 1 x daily - 7 x weekly - 2 sets - 1-min hold ?  ?  ?ASSESSMENT: ?  ?CLINICAL IMPRESSION: ?Due to pt arriving 10 minutes late, the session was truncated today. She responded  well to all interventions today, demonstrating good form and no increase in pain with selected exercises. Upon re-assessment of BIL hip strength, the pt has made excellent progress. She will continue to benefit from s

## 2021-05-17 ENCOUNTER — Ambulatory Visit
Admission: RE | Admit: 2021-05-17 | Discharge: 2021-05-17 | Disposition: A | Discharge status: Home / Self Care | Payer: 59 | Source: Ambulatory Visit | Attending: Internal Medicine | Admitting: Internal Medicine

## 2021-05-17 ENCOUNTER — Other Ambulatory Visit: Payer: Self-pay

## 2021-05-17 ENCOUNTER — Ambulatory Visit: Payer: 59 | Attending: Orthopaedic Surgery

## 2021-05-17 DIAGNOSIS — M25552 Pain in left hip: Secondary | ICD-10-CM | POA: Insufficient documentation

## 2021-05-17 DIAGNOSIS — M6281 Muscle weakness (generalized): Secondary | ICD-10-CM | POA: Diagnosis present

## 2021-05-17 DIAGNOSIS — G8929 Other chronic pain: Secondary | ICD-10-CM | POA: Insufficient documentation

## 2021-05-17 DIAGNOSIS — M25551 Pain in right hip: Secondary | ICD-10-CM | POA: Diagnosis present

## 2021-05-17 DIAGNOSIS — E041 Nontoxic single thyroid nodule: Secondary | ICD-10-CM

## 2021-05-17 DIAGNOSIS — M5440 Lumbago with sciatica, unspecified side: Secondary | ICD-10-CM | POA: Insufficient documentation

## 2021-05-20 ENCOUNTER — Encounter: Payer: Self-pay | Admitting: Internal Medicine

## 2021-05-22 NOTE — Therapy (Signed)
?OUTPATIENT PHYSICAL THERAPY TREATMENT NOTE/ RE-EVALUATION ? ? ?Patient Name: [name] ?MRN: [id number] ?DOB:February 14, 1966, 56 y.o., female ?Today's Date: 05/23/2021 ? ?PCP: Haydee Salter, MD ?REFERRING PROVIDER: Haydee Salter, MD ? ? PT End of Session - 05/23/21 1747   ? ? Visit Number 4   ? Number of Visits 9   ? Date for PT Re-Evaluation 05/24/21   ? Authorization Type [employer]   ? PT Start Time 5956   ? PT Stop Time 3875   ? PT Time Calculation (min) 44 min   ? Activity Tolerance Patient tolerated treatment well   ? Behavior During Therapy [facility] for tasks assessed/performed   ? ?  ?  ? ?  ? ? ? ? ?Past Medical History:  ?Diagnosis Date  ? Abnormal uterine bleeding (AUB)   ? Arthritis   ? Diabetes mellitus   ? Elevated cholesterol   ? Endometrial polyp   ? Endometrial polyp   ? Endometriosis   ? Hypertension   ? Migraines   ? Nodule   ? Ovarian cyst   ? ?Past Surgical History:  ?Procedure Laterality Date  ? COMBINED HYSTEROSCOPY DIAGNOSTIC / D&C  yrs ago  ? DIAGNOSTIC LAPAROSCOPY  1993  ? with laser adhesions  ? DILITATION & CURRETTAGE/HYSTROSCOPY WITH NOVASURE ABLATION N/A 11/04/2019  ? Procedure: DILATATION & CURETTAGE/HYSTEROSCOPY WITH NOVASURE ABLATION;  Surgeon: Joseph Pierini, MD;  Location: [facility];  Service: Gynecology;  Laterality: N/A;  ? INTRAUTERINE DEVICE INSERTION    ? mirena-Inserted 05-16-14  ? KNEE SURGERY Left yrs ago  ? meniscurs tear repair  ? mirena removed  2021  ? OOPHORECTOMY  2008  ? left  ? ROTATOR CUFF REPAIR Right yrs ago  ? WRIST SURGERY Right   ? gang. cyst  ? ?Patient Active Problem List  ? Diagnosis Date Noted  ? Depression with anxiety 09/27/2020  ? Migraine headache 09/27/2020  ? Insomnia 09/27/2020  ? Thyroid nodule 09/27/2020  ? Obesity (BMI 30.0-34.9) 03/09/2019  ? Acute right-sided low back pain with right-sided sciatica 06/10/2017  ? Trochanteric bursitis, right hip 05/27/2017  ? Atypical chest pain 11/18/2012  ? Type 2 diabetes mellitus ([place])  11/18/2012  ? Essential hypertension 11/18/2012  ? Hyperlipidemia   ? ? ?REFERRING DIAG: M54.42,M54.41,G89.29 (ICD-10-CM) - Chronic bilateral low back pain with bilateral sciatica ? ?THERAPY DIAG:  ?Chronic bilateral low back pain with sciatica, sciatica laterality unspecified ? ?Muscle weakness (generalized) ? ?Pain in left hip ? ?Pain in right hip ? ? ?SUBJECTIVE: Pt reports no pain currently, although she had a flare up on Monday of this week of increased low back and BIL hip pain. This was improved with exercise and massage. ? ?PAIN:  ?Are you having pain? Yes ?NPRS scale: 0/10 ?Pain location: Low back ?Pain orientation: Bilateral  ?PAIN TYPE: dull and shooting ?Aggravating factors: prolonged sitting >30 minutes, prolonged standing/ walking >1 hour, and bending forward ?Relieving factors: positional change, once weekly exercise class, pain medications ? ? ? ? ?OBJECTIVE:  ? *Unless otherwise noted, objective measures collected previously* ? ?DIAGNOSTIC FINDINGS:  ?03/02/2021: MR Lumbar Spine WO Contrast: IMPRESSION: ?1. Mild degenerative disc bulging and facet hypertrophy at L2-3 ?through L4-5 with resultant mild bilateral L2 through L4 foraminal ?stenosis. Mild spinal stenosis at the L3-4 level. ?2. Mild to moderate multilevel facet hypertrophy throughout the ?lumbar spine, most pronounced at L4-5 bilaterally. Findings could ?contribute to underlying back pain. ?  ?02/13/2021: XR Bilateral hips 2 views: IMPRESSION: Compared to films 2019  there is no significant  ?interval change.  Mild arthritis of both hips with periarticular spurring  ?proximal acetabulum.  No acute fractures.  Both hips well located. ?  ?PATIENT SURVEYS:  ?FOTO 63%, 68% ?  ?SCREENING FOR RED FLAGS: ?Bowel or bladder incontinence: No ?Cauda equina syndrome: No ?  ?COGNITION: ?         Overall cognitive status: Within functional limits for tasks assessed              ?          ?SENSATION: ?         Light touch: Appears intact ?          ?   ?MUSCLE LENGTH: ?Thomas test: (+) BIL, severe limitation ?  ?POSTURE:  ?Decreased lumbar lordosis ?  ?PALPATION: ?TTP to lower lumbar paraspinals/ QL, exquisite TTP to Lt trochanter ?  ?PASSIVE ACCESSORIES: ?Painful and hypomobile CPAs from L3-S1 ?  ?LUMBAR AROM ?  ?AROM AROM  ?03/29/2021 AROM ?05/23/2021  ?Flexion 45 with p! on return to standing 80  ?Extension 5p! 13 minor p!  ?Right lateral flexion 12 15  ?Left lateral flexion 15p! 15p!  ?Right rotation 40 55  ?Left rotation 50p! 45  ? (Blank rows = not tested) ?  ?LE AROM/PROM: ?  ?A/PROM Right ?03/29/2021 Left ?03/29/2021 Left ?05/23/2021  ?Hip flexion 110/120 100/ 105 with lateral hip pain 102/110  ?Hip extension 15/20 5/10 10/12  ?Hip abduction 45/55 30p!/ 35p! 60/65  ?Hip adduction 24/30 5p!/ 10p! 20p! At lateral hip  ?Hip internal rotation 38 30p! 38p!  ?Hip external rotation 35 20 42  ?  ?  ?LE MMT: ?  ?MMT Right ?03/29/2021 Left ?03/29/2021 Right ?05/17/2021 Left ?05/17/2021  ?Hip flexion 5/5 4/5 5/5 5/5  ?Hip extension 3+/5 3/5p! 4/5 4/5  ?Hip abduction 3+/5 3/5p! 4/5 4/5p!  ?Hip internal rotation 5/5 5/5 5/5 5/5  ?Hip external rotation 4+/5 4/5p! 5/5 5/5  ? (Blank rows = not tested) ?  ?LUMBAR SPECIAL TESTS:  ?Slump test: (-) BIL ?SLR: (-) BIL ?ASLR: (-) BIL ?Repeated lumbar extension: (+) for centralization of LBP ?Hip scour: (-) BIL ?  ?FUNCTIONAL TESTS:  ?Not performed at this visit ?  ?  ?  ?TODAY'S TREATMENT  ? ?[facility] Adult PT Treatment:                                                DATE: 05/23/2021 ?Therapeutic Exercise: ?Side knee plank with clams 3x10 BIL ?Dead bugs with YTB around feet 3x20 ?Prone Swimmers with YTB around ankles 3x20 ?Standing abdominal press-down with 23# cable 3x10 ?Standing trunk extension stretch 2x10 ?Manual Therapy: ?Sidelying lumbar roll grade V manipulation x1 BIL with cavitation ?Neuromuscular re-ed: ?N/A ?Therapeutic Activity: ?Re-assessment of objective information with patient education ?Standard dead lift with 45# barbell  3x8 ?Modalities: ?N/A ?Self Care: ?N/A ? ? ?[facility] Adult PT Treatment:                                                DATE: 05/17/2021 ?Therapeutic Exercise: ?Supine reverse crunch 3x12 ?Side plank 3x30sec BIL ?Knee plank with alternating hip extension 3x14 ?Tall-kneeling hip thrust on Airex pad with 20# cable 3x10 ?Standing Pallof press with  7# cable x10 with 5-sec hold BIL ?Manual Therapy: ?N/A ?Neuromuscular re-ed: ?N/A ?Therapeutic Activity: ?N/A ?Modalities: ?N/A ?Self Care: ?N/A ? ? ?[facility] Adult PT Treatment:                                                DATE: 04/26/2021 ?Therapeutic Exercise: ?NuStep level 6 x5 minutes while collecting subjective ?Kickstand stance Pallof press with 3# cable into hip ER and IR 2x10 with 5-sec hold each BIL ?Supine dead bugs 3x20 ?Prone with hips on Bosu ball and PT holding legs, trunk extension to neutral 3x10 ?Standing Cybex hip abduction with 25# cable 2x10 BIL ?Standing Cybex hip extension with 25# cable 2x10 BIL ?Manual Therapy: ?N/A ?Neuromuscular re-ed: ?N/A ?Therapeutic Activity: ?N/A ?Modalities: ?N/A ?Self Care: ?N/A ? ? ? ?  ?  ?PATIENT EDUCATION:  ?Education details: Educated on probable underlying pathophysiology behind her pain presentation, POC, prognosis, and HEP ?Person educated: Patient ?Education method: Explanation, Demonstration, and Handouts ?Education comprehension: verbalized understanding and returned demonstration ?  ?  ?HOME EXERCISE PROGRAM: ?Access Code: [email or web address] ?URL: [email or web address] ?Date: 03/29/2021 ?Prepared by: Vanessa Trenton ?  ?Exercises ?Abdominal Isometric Hold - FEET OFF TABLE* - 1 x daily - 7 x weekly - 3 sets - 30-sec hold ?Supine Bridge - 1 x daily - 7 x weekly - 3 sets - 10 reps - 3-sec hold ?Sidelying Hip Abduction - 1 x daily - 7 x weekly - 2 sets - 10 reps - 3-sec hold ?Modified Thomas Stretch - 1 x daily - 7 x weekly - 2 sets - 1-min hold ?  ?  ?ASSESSMENT: ?  ?CLINICAL IMPRESSION: ?Pt responded well to all  interventions today, demonstrating good form and no increase in pain. Upon re-assessment, the pt has made good progress in overall hip and lumbar ROM with decreased symptom production. The pt still remains limite

## 2021-05-23 ENCOUNTER — Ambulatory Visit: Payer: 59

## 2021-05-23 ENCOUNTER — Other Ambulatory Visit: Payer: Self-pay

## 2021-05-23 DIAGNOSIS — G8929 Other chronic pain: Secondary | ICD-10-CM

## 2021-05-23 DIAGNOSIS — M25551 Pain in right hip: Secondary | ICD-10-CM

## 2021-05-23 DIAGNOSIS — M5440 Lumbago with sciatica, unspecified side: Secondary | ICD-10-CM | POA: Diagnosis not present

## 2021-05-23 DIAGNOSIS — M25552 Pain in left hip: Secondary | ICD-10-CM

## 2021-05-23 DIAGNOSIS — M6281 Muscle weakness (generalized): Secondary | ICD-10-CM

## 2021-05-23 NOTE — Patient Instructions (Signed)
Schedule with Landry Mellow, Farrel Conners ?1 visits per week for 5 weeks ?POC end date 06/27/2021 ?Aquatic Therapy (Y/N) N ?If YES start aquatic therapy at visit N/A, and land on visit N/A. ?Put on wait list if unable to schedule within 1 week. ?In-person interpreter? (Y/N) N ?Medicaid? (Y/N) N (schedule next appt 5 business days unless otherwise noted) ?*If doing aquatic therapy last visit must be @ [facility] ? ?

## 2021-05-30 ENCOUNTER — Ambulatory Visit: Payer: 59 | Admitting: Orthopaedic Surgery

## 2021-05-31 ENCOUNTER — Ambulatory Visit: Payer: 59

## 2021-06-06 ENCOUNTER — Ambulatory Visit: Payer: 59 | Attending: Orthopaedic Surgery

## 2021-06-06 DIAGNOSIS — M25552 Pain in left hip: Secondary | ICD-10-CM | POA: Diagnosis present

## 2021-06-06 DIAGNOSIS — M6281 Muscle weakness (generalized): Secondary | ICD-10-CM | POA: Diagnosis present

## 2021-06-06 DIAGNOSIS — M25551 Pain in right hip: Secondary | ICD-10-CM | POA: Diagnosis present

## 2021-06-06 DIAGNOSIS — G8929 Other chronic pain: Secondary | ICD-10-CM | POA: Insufficient documentation

## 2021-06-06 DIAGNOSIS — M5440 Lumbago with sciatica, unspecified side: Secondary | ICD-10-CM | POA: Diagnosis present

## 2021-06-06 NOTE — Therapy (Addendum)
?OUTPATIENT PHYSICAL THERAPY TREATMENT NOTE/ RE-EVALUATION/ DISCHARGE SUMMARY ? ? ?Patient Name: [name] ?MRN: [id number] ?DOB:1965/06/02, 56 y.o., female ?Today's Date: 06/06/2021 ? ?PCP: Haydee Salter, MD ?REFERRING PROVIDER: Mcarthur Rossetti* ? ? PT End of Session - 06/06/21 1747   ? ? Visit Number 5   ? Number of Visits 9   ? Date for PT Re-Evaluation 06/27/21   ? Authorization Type [employer]   ? PT Start Time 1497   ? PT Stop Time 0263   ? PT Time Calculation (min) 41 min   ? Activity Tolerance Patient tolerated treatment well   ? Behavior During Therapy [facility] for tasks assessed/performed   ? ?  ?  ? ?  ? ? ? ? ? ?Past Medical History:  ?Diagnosis Date  ? Abnormal uterine bleeding (AUB)   ? Arthritis   ? Diabetes mellitus   ? Elevated cholesterol   ? Endometrial polyp   ? Endometrial polyp   ? Endometriosis   ? Hypertension   ? Migraines   ? Nodule   ? Ovarian cyst   ? ?Past Surgical History:  ?Procedure Laterality Date  ? COMBINED HYSTEROSCOPY DIAGNOSTIC / D&C  yrs ago  ? DIAGNOSTIC LAPAROSCOPY  1993  ? with laser adhesions  ? DILITATION & CURRETTAGE/HYSTROSCOPY WITH NOVASURE ABLATION N/A 11/04/2019  ? Procedure: DILATATION & CURETTAGE/HYSTEROSCOPY WITH NOVASURE ABLATION;  Surgeon: Joseph Pierini, MD;  Location: [facility];  Service: Gynecology;  Laterality: N/A;  ? INTRAUTERINE DEVICE INSERTION    ? mirena-Inserted 05-16-14  ? KNEE SURGERY Left yrs ago  ? meniscurs tear repair  ? mirena removed  2021  ? OOPHORECTOMY  2008  ? left  ? ROTATOR CUFF REPAIR Right yrs ago  ? WRIST SURGERY Right   ? gang. cyst  ? ?Patient Active Problem List  ? Diagnosis Date Noted  ? Depression with anxiety 09/27/2020  ? Migraine headache 09/27/2020  ? Insomnia 09/27/2020  ? Thyroid nodule 09/27/2020  ? Obesity (BMI 30.0-34.9) 03/09/2019  ? Acute right-sided low back pain with right-sided sciatica 06/10/2017  ? Trochanteric bursitis, right hip 05/27/2017  ? Atypical chest pain 11/18/2012  ? Type 2  diabetes mellitus ([place]) 11/18/2012  ? Essential hypertension 11/18/2012  ? Hyperlipidemia   ? ? ?REFERRING DIAG: M54.42,M54.41,G89.29 (ICD-10-CM) - Chronic bilateral low back pain with bilateral sciatica ? ?THERAPY DIAG:  ?Chronic bilateral low back pain with sciatica, sciatica laterality unspecified ? ?Muscle weakness (generalized) ? ?Pain in left hip ? ?Pain in right hip ? ? 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 She reports that her back was sore the past week. She reports varied adherence to her HEP the past week. ? ?PAIN:  ?Are you having pain? Yes ?NPRS scale: 0/10 ?Pain location: Low back ?Pain orientation: Bilateral  ?PAIN TYPE: dull and shooting ?Aggravating factors: prolonged sitting >30 minutes, prolonged standing/ walking >1 hour, and bending forward ?Relieving factors: positional change, once weekly exercise class, pain medications ? ? ? ? ?OBJECTIVE:  ? *Unless otherwise noted, objective measures collected previously* ? ?DIAGNOSTIC FINDINGS:  ?03/02/2021: MR Lumbar Spine WO Contrast: IMPRESSION: ?1. Mild degenerative disc bulging and facet hypertrophy at L2-3 ?through L4-5 with resultant mild bilateral L2 through L4 foraminal ?stenosis. Mild spinal stenosis at the L3-4 level. ?2. Mild to moderate multilevel facet hypertrophy throughout the ?lumbar spine, most pronounced at L4-5 bilaterally. Findings could ?contribute to underlying back pain. ?  ?02/13/2021:  XR Bilateral hips 2 views: IMPRESSION: Compared to films 2019 there is no significant  ?interval change.  Mild arthritis of both hips with periarticular spurring  ?proximal acetabulum.  No acute fractures.  Both hips well located. ?  ?PATIENT SURVEYS:  ?FOTO 63%, 68% ?  ?SCREENING FOR RED FLAGS: ?Bowel or bladder incontinence: No ?Cauda equina syndrome: No ?  ?COGNITION: ?         Overall cognitive status: Within functional limits for tasks assessed               ?          ?SENSATION: ?         Light touch: Appears intact ?          ?  ?MUSCLE LENGTH: ?Thomas test: (+) BIL, severe limitation ?  ?POSTURE:  ?Decreased lumbar lordosis ?  ?PALPATION: ?TTP to lower lumbar paraspinals/ QL, exquisite TTP to Lt trochanter ?  ?PASSIVE ACCESSORIES: ?Painful and hypomobile CPAs from L3-S1 ?  ?LUMBAR AROM ?  ?AROM AROM  ?03/29/2021 AROM ?05/23/2021  ?Flexion 45 with p! on return to standing 80  ?Extension 5p! 13 minor p!  ?Right lateral flexion 12 15  ?Left lateral flexion 15p! 15p!  ?Right rotation 40 55  ?Left rotation 50p! 45  ? (Blank rows = not tested) ?  ?LE AROM/PROM: ?  ?A/PROM Right ?03/29/2021 Left ?03/29/2021 Left ?05/23/2021  ?Hip flexion 110/120 100/ 105 with lateral hip pain 102/110  ?Hip extension 15/20 5/10 10/12  ?Hip abduction 45/55 30p!/ 35p! 60/65  ?Hip adduction 24/30 5p!/ 10p! 20p! At lateral hip  ?Hip internal rotation 38 30p! 38p!  ?Hip external rotation 35 20 42  ?  ?  ?LE MMT: ?  ?MMT Right ?03/29/2021 Left ?03/29/2021 Right ?05/17/2021 Left ?05/17/2021  ?Hip flexion 5/5 4/5 5/5 5/5  ?Hip extension 3+/5 3/5p! 4/5 4/5  ?Hip abduction 3+/5 3/5p! 4/5 4/5p!  ?Hip internal rotation 5/5 5/5 5/5 5/5  ?Hip external rotation 4+/5 4/5p! 5/5 5/5  ? (Blank rows = not tested) ?  ?LUMBAR SPECIAL TESTS:  ?Slump test: (-) BIL ?SLR: (-) BIL ?ASLR: (-) BIL ?Repeated lumbar extension: (+) for centralization of LBP ?Hip scour: (-) BIL ?  ?FUNCTIONAL TESTS:  ?Not performed at this visit ?  ?  ?  ?TODAY'S TREATMENT  ? ?[facility] Adult PT Treatment:                                                DATE: 06/06/2021 ?Therapeutic Exercise: ?Side knee plank with hip abduction 3x10 BIL ?Sidelying IT band stretch with leg off table x2 min BIL ?55# hex bar dead lift 3x8 ?Standing abdominal press-down with 23# cable 3x10 ?Trunk side bend with 20# cable 2x10 BIL ?Manual Therapy: ?Hooklying manual lateral hip traction with mobilization belt with gentle oscillations x3 min BIL ?Long-axis hip distraction x1  BIL ?Supine long-axis hip grade V manipulation x1 BIL with cavitation ?Neuromuscular re-ed: ?N/A ?Therapeutic Activity: ?N/A ?Modalities: ?N/A ?Self Care: ?N/A ? ? ?[place] Adult PT Treatment:                                                DATE: 05/23/2021 ?Therapeutic Exercise: ?Side knee plank with clams 3x10  BIL ?Dead bugs with YTB around feet 3x20 ?Prone Swimmers with YTB around ankles 3x20 ?Standing abdominal press-down with 23# cable 3x10 ?Standing trunk extension stretch 2x10 ?Manual Therapy: ?Sidelying lumbar roll grade V manipulation x1 BIL with cavitation ?Neuromuscular re-ed: ?N/A ?Therapeutic Activity: ?Re-assessment of objective information with patient education ?Standard dead lift with 45# barbell 3x8 ?Modalities: ?N/A ?Self Care: ?N/A ? ? ?[place] Adult PT Treatment:                                                DATE: 05/17/2021 ?Therapeutic Exercise: ?Supine reverse crunch 3x12 ?Side plank 3x30sec BIL ?Knee plank with alternating hip extension 3x14 ?Tall-kneeling hip thrust on Airex pad with 20# cable 3x10 ?Standing Pallof press with 7# cable x10 with 5-sec hold BIL ?Manual Therapy: ?N/A ?Neuromuscular re-ed: ?N/A ?Therapeutic Activity: ?N/A ?Modalities: ?N/A ?Self Care: ?N/A ? ? ? ? ?  ?  ?PATIENT EDUCATION:  ?Education details: Educated on probable underlying pathophysiology behind her pain presentation, POC, prognosis, and HEP ?Person educated: Patient ?Education method: Explanation, Demonstration, and Handouts ?Education comprehension: verbalized understanding and returned demonstration ?  ?  ?HOME EXERCISE PROGRAM: ?Access Code: [email or web address] ?URL: [email or web address] ?Date: 03/29/2021 ?Prepared by: Vanessa Rollingwood ?  ?Exercises ?Abdominal Isometric Hold - FEET OFF TABLE* - 1 x daily - 7 x weekly - 3 sets - 30-sec hold ?Supine Bridge - 1 x daily - 7 x weekly - 3 sets - 10 reps - 3-sec hold ?Sidelying Hip Abduction - 1 x daily - 7 x weekly - 2 sets - 10 reps - 3-sec hold ?Modified Thomas  Stretch - 1 x daily - 7 x weekly - 2 sets - 1-min hold ?  ?  ?ASSESSMENT: ?  ?CLINICAL IMPRESSION: ?Pt responded well to all interventions today, demonstrating good form and no increase in pain with pe

## 2021-06-12 ENCOUNTER — Encounter: Payer: Self-pay | Admitting: Orthopaedic Surgery

## 2021-06-12 ENCOUNTER — Ambulatory Visit (INDEPENDENT_AMBULATORY_CARE_PROVIDER_SITE_OTHER): Payer: 59 | Admitting: Orthopaedic Surgery

## 2021-06-12 DIAGNOSIS — M47816 Spondylosis without myelopathy or radiculopathy, lumbar region: Secondary | ICD-10-CM | POA: Diagnosis not present

## 2021-06-12 DIAGNOSIS — M7061 Trochanteric bursitis, right hip: Secondary | ICD-10-CM

## 2021-06-12 MED ORDER — LIDOCAINE HCL 1 % IJ SOLN
3.0000 mL | INTRAMUSCULAR | Status: AC | PRN
  Administered 2021-06-12: 3 mL

## 2021-06-12 MED ORDER — METHYLPREDNISOLONE ACETATE 40 MG/ML IJ SUSP
40.0000 mg | INTRAMUSCULAR | Status: AC | PRN
  Administered 2021-06-12: 40 mg via INTRA_ARTICULAR

## 2021-06-12 NOTE — Progress Notes (Signed)
? ?Office Visit Note ?  ?Patient: [name]           ?Date of Birth: 08-19-1965           ?MRN: [id number] ?Visit Date: 06/12/2021 ?             ?Requested by: Haydee Salter, MD ?[address] ?[place],  [place] [address] ?PCP: Haydee Salter, MD ? ? ?Assessment & Plan: ?Visit Diagnoses:  ?1. Trochanteric bursitis, right hip   ?2. Spondylosis without myelopathy or radiculopathy, lumbar region   ? ? ?Plan: Discussed with her attending stretching exercises.  She will continue with formal therapy to gain a home exercise program for her back and her hip.  She will follow-up with [place] as needed.  Questions encouraged and answered at length ? ?Follow-Up Instructions: Return if symptoms worsen or fail to improve.  ? ?Orders:  ?Orders Placed This Encounter  ?Procedures  ? Large Joint Inj  ? ?No orders of the defined types were placed in this encounter. ? ? ? ? Procedures: ?Large Joint Inj: L greater trochanter on 06/12/2021 9:19 AM ?Indications: pain ?Details: 22 G 1.5 in needle, lateral approach ? ?Arthrogram: No ? ?Medications: 3 mL lidocaine 1 %; 40 mg methylPREDNISolone acetate 40 MG/ML ?Outcome: tolerated well, no immediate complications ?Procedure, treatment alternatives, risks and benefits explained, specific risks discussed. Consent was given by the patient. Immediately prior to procedure a time out was called to verify the correct patient, procedure, equipment, support staff and site/side marked as required. Patient was prepped and draped in the usual sterile fashion.  ? ? ? ? ?Clinical Data: ?No additional findings. ? ? ?Subjective: ?Chief Complaint  ?Patient presents with  ? Lower Back - Follow-up  ? ? ?HPI ?[name] comes in today for follow-up of her low back pain and left hip pain.  She currently going to physical therapy for core strengthening back exercises and a home exercise program.  She has found therapy to be very beneficial.  She states the L4-5 articular injection by Dr.  Ernestina Patches on 04/29/2021 gave her 50% relief now with therapy she is up to 70% relief in regards to her back and hip.  She is having left hip pain when she lies on the hip.  She denies any radicular symptoms down either leg.  She has low back pain with prolonged sitting and increased activity.  She is diabetic her last hemoglobin A1c 2 months ago was 6.5.  She denies any fevers chills. ?Review of Systems  ?Constitutional:  Negative for chills and fever.  ? ? ?Objective: ?Vital Signs: LMP  (LMP Unknown)  ? ?Physical Exam ?Pulmonary:  ?   Effort: Pulmonary effort is normal.  ?Neurological:  ?   Mental Status: She is alert.  ?Psychiatric:     ?   Mood and Affect: Mood normal.  ? ? ?Ortho Exam ?Bilateral hips good range of motion external rotation left hip causes discomfort.  Tenderness over the left hip trochanteric region.  Negative straight leg raise bilaterally. ?Specialty Comments:  ?MRI LUMBAR SPINE WITHOUT CONTRAST ?  ?TECHNIQUE: ?Multiplanar, multisequence MR imaging of the lumbar spine was ?performed. No intravenous contrast was administered. ?  ?COMPARISON:  Comparison made with prior radiograph from 02/13/2021 ?as well as previous MRI from 02/27/2012. ?  ?FINDINGS: ?Segmentation: Standard. Lowest well-formed disc space labeled the ?L5-S1 level. ?  ?Alignment: Trace facet mediated anterolisthesis of L4 on L5. ?Alignment otherwise normal preservation of the normal lumbar ?lordosis. ?  ?  Vertebrae: Vertebral body height well maintained without acute or ?chronic fracture. Bone marrow signal intensity within normal limits. ?No discrete or worrisome osseous lesions. Mild reactive marrow edema ?present about the L4-5 facets due to facet arthritis. No other ?abnormal marrow edema. ?  ?Conus medullaris and cauda equina: Conus extends to the L1 level. ?Conus and cauda equina appear normal. ?  ?Paraspinal and other soft tissues: Unremarkable. ?  ?Disc levels: ?  ?L1-2:  Unremarkable. ?  ?L2-3: Mild circumferential disc bulge  with disc desiccation and ?intervertebral disc space narrowing. Minimal facet hypertrophy. No ?spinal stenosis. Mild bilateral L2 foraminal narrowing, slightly ?worse on the right. ?  ?L3-4: Disc desiccation with mild circumferential disc bulge and ?intervertebral disc space narrowing. Mild-to-moderate bilateral ?facet hypertrophy. Resultant mild spinal stenosis. Mild right worse ?than left L3 foraminal narrowing. ?  ?L4-5: Trace anterolisthesis. Mild disc bulge with disc desiccation. ?Moderate bilateral facet hypertrophy. No significant spinal ?stenosis. Mild bilateral L4 foraminal narrowing. ?  ?L5-S1: Negative interspace. Moderate left with mild right facet ?hypertrophy. No spinal stenosis. Foramina remain patent. ?  ?IMPRESSION: ?1. Mild degenerative disc bulging and facet hypertrophy at L2-3 ?through L4-5 with resultant mild bilateral L2 through L4 foraminal ?stenosis. Mild spinal stenosis at the L3-4 level. ?2. Mild to moderate multilevel facet hypertrophy throughout the ?lumbar spine, most pronounced at L4-5 bilaterally. Findings could ?contribute to underlying back pain. ?  ?  ?Electronically Signed ?  By: Jeannine Boga M.D. ?  On: 03/05/2021 02:45 ? ?Imaging: ?No results found. ? ? ?PMFS History: ?Patient Active Problem List  ? Diagnosis Date Noted  ? Depression with anxiety 09/27/2020  ? Migraine headache 09/27/2020  ? Insomnia 09/27/2020  ? Thyroid nodule 09/27/2020  ? Obesity (BMI 30.0-34.9) 03/09/2019  ? Acute right-sided low back pain with right-sided sciatica 06/10/2017  ? Trochanteric bursitis, right hip 05/27/2017  ? Atypical chest pain 11/18/2012  ? Type 2 diabetes mellitus ([place]) 11/18/2012  ? Essential hypertension 11/18/2012  ? Hyperlipidemia   ? ?Past Medical History:  ?Diagnosis Date  ? Abnormal uterine bleeding (AUB)   ? Arthritis   ? Diabetes mellitus   ? Elevated cholesterol   ? Endometrial polyp   ? Endometrial polyp   ? Endometriosis   ? Hypertension   ? Migraines   ? Nodule   ?  Ovarian cyst   ?  ?Family History  ?Problem Relation Age of Onset  ? Hypertension Mother   ? Diabetes Mother   ? COPD Mother   ? Cancer Mother   ?     Lung  ? Hypertension Father   ? Stroke Father 39  ? Diabetes Sister   ? Hypertension Brother   ? Diabetes Maternal Aunt   ? Cancer Maternal Aunt   ?     Kidney  ? Kidney disease Maternal Aunt   ? Diabetes Maternal Aunt   ? Stroke Maternal Grandfather   ? Diabetes Maternal Grandfather   ? Colon cancer Neg Hx   ? Esophageal cancer Neg Hx   ? Rectal cancer Neg Hx   ? Stomach cancer Neg Hx   ?  ?Past Surgical History:  ?Procedure Laterality Date  ? COMBINED HYSTEROSCOPY DIAGNOSTIC / D&C  yrs ago  ? DIAGNOSTIC LAPAROSCOPY  1993  ? with laser adhesions  ? DILITATION & CURRETTAGE/HYSTROSCOPY WITH NOVASURE ABLATION N/A 11/04/2019  ? Procedure: DILATATION & CURETTAGE/HYSTEROSCOPY WITH NOVASURE ABLATION;  Surgeon: Joseph Pierini, MD;  Location: [facility];  Service: Gynecology;  Laterality: N/A;  ?  INTRAUTERINE DEVICE INSERTION    ? mirena-Inserted 05-16-14  ? KNEE SURGERY Left yrs ago  ? meniscurs tear repair  ? mirena removed  2021  ? OOPHORECTOMY  2008  ? left  ? ROTATOR CUFF REPAIR Right yrs ago  ? WRIST SURGERY Right   ? gang. cyst  ? ?Social History  ? ?Occupational History  ? Not on file  ?Tobacco Use  ? Smoking status: Never  ? Smokeless tobacco: Never  ?Vaping Use  ? Vaping Use: Never used  ?Substance and Sexual Activity  ? Alcohol use: Not Currently  ? Drug use: No  ? Sexual activity: Yes  ?  Birth control/protection: Post-menopausal  ? ? ? ? ? ? ?

## 2021-06-13 NOTE — Therapy (Incomplete)
?OUTPATIENT PHYSICAL THERAPY TREATMENT NOTE/ RE-EVALUATION ? ? ?Patient Name: [name] ?MRN: [id number] ?DOB:11-Jul-1965, 56 y.o., female ?Today's Date: 06/13/2021 ? ?PCP: Haydee Salter, MD ?REFERRING PROVIDER: Mcarthur Rossetti* ? ? ? ? ? ? ? ?Past Medical History:  ?Diagnosis Date  ? Abnormal uterine bleeding (AUB)   ? Arthritis   ? Diabetes mellitus   ? Elevated cholesterol   ? Endometrial polyp   ? Endometrial polyp   ? Endometriosis   ? Hypertension   ? Migraines   ? Nodule   ? Ovarian cyst   ? ?Past Surgical History:  ?Procedure Laterality Date  ? COMBINED HYSTEROSCOPY DIAGNOSTIC / D&C  yrs ago  ? DIAGNOSTIC LAPAROSCOPY  1993  ? with laser adhesions  ? DILITATION & CURRETTAGE/HYSTROSCOPY WITH NOVASURE ABLATION N/A 11/04/2019  ? Procedure: DILATATION & CURETTAGE/HYSTEROSCOPY WITH NOVASURE ABLATION;  Surgeon: Joseph Pierini, MD;  Location: [facility];  Service: Gynecology;  Laterality: N/A;  ? INTRAUTERINE DEVICE INSERTION    ? mirena-Inserted 05-16-14  ? KNEE SURGERY Left yrs ago  ? meniscurs tear repair  ? mirena removed  2021  ? OOPHORECTOMY  2008  ? left  ? ROTATOR CUFF REPAIR Right yrs ago  ? WRIST SURGERY Right   ? gang. cyst  ? ?Patient Active Problem List  ? Diagnosis Date Noted  ? Depression with anxiety 09/27/2020  ? Migraine headache 09/27/2020  ? Insomnia 09/27/2020  ? Thyroid nodule 09/27/2020  ? Obesity (BMI 30.0-34.9) 03/09/2019  ? Acute right-sided low back pain with right-sided sciatica 06/10/2017  ? Trochanteric bursitis, right hip 05/27/2017  ? Atypical chest pain 11/18/2012  ? Type 2 diabetes mellitus ([place]) 11/18/2012  ? Essential hypertension 11/18/2012  ? Hyperlipidemia   ? ? ?REFERRING DIAG: M54.42,M54.41,G89.29 (ICD-10-CM) - Chronic bilateral low back pain with bilateral sciatica ? ?THERAPY DIAG:  ?No diagnosis found. ? ? ?SUBJECTIVE: *** ? ?PAIN:  ?Are you having pain? Yes ?NPRS scale: 0/10 ?Pain location: Low back ?Pain orientation: Bilateral  ?PAIN  TYPE: dull and shooting ?Aggravating factors: prolonged sitting >30 minutes, prolonged standing/ walking >1 hour, and bending forward ?Relieving factors: positional change, once weekly exercise class, pain medications ? ? ? ? ?OBJECTIVE:  ? *Unless otherwise noted, objective measures collected previously* ? ?DIAGNOSTIC FINDINGS:  ?03/02/2021: MR Lumbar Spine WO Contrast: IMPRESSION: ?1. Mild degenerative disc bulging and facet hypertrophy at L2-3 ?through L4-5 with resultant mild bilateral L2 through L4 foraminal ?stenosis. Mild spinal stenosis at the L3-4 level. ?2. Mild to moderate multilevel facet hypertrophy throughout the ?lumbar spine, most pronounced at L4-5 bilaterally. Findings could ?contribute to underlying back pain. ?  ?02/13/2021: XR Bilateral hips 2 views: IMPRESSION: Compared to films 2019 there is no significant  ?interval change.  Mild arthritis of both hips with periarticular spurring  ?proximal acetabulum.  No acute fractures.  Both hips well located. ?  ?PATIENT SURVEYS:  ?FOTO 63%, 68% ?  ?SCREENING FOR RED FLAGS: ?Bowel or bladder incontinence: No ?Cauda equina syndrome: No ?  ?COGNITION: ?         Overall cognitive status: Within functional limits for tasks assessed              ?          ?SENSATION: ?         Light touch: Appears intact ?          ?  ?MUSCLE LENGTH: ?Thomas test: (+) BIL, severe limitation ?  ?POSTURE:  ?Decreased lumbar lordosis ?  ?  PALPATION: ?TTP to lower lumbar paraspinals/ QL, exquisite TTP to Lt trochanter ?  ?PASSIVE ACCESSORIES: ?Painful and hypomobile CPAs from L3-S1 ?  ?LUMBAR AROM ?  ?AROM AROM  ?03/29/2021 AROM ?05/23/2021  ?Flexion 45 with p! on return to standing 80  ?Extension 5p! 13 minor p!  ?Right lateral flexion 12 15  ?Left lateral flexion 15p! 15p!  ?Right rotation 40 55  ?Left rotation 50p! 45  ? (Blank rows = not tested) ?  ?LE AROM/PROM: ?  ?A/PROM Right ?03/29/2021 Left ?03/29/2021 Left ?05/23/2021  ?Hip flexion 110/120 100/ 105 with lateral hip pain  102/110  ?Hip extension 15/20 5/10 10/12  ?Hip abduction 45/55 30p!/ 35p! 60/65  ?Hip adduction 24/30 5p!/ 10p! 20p! At lateral hip  ?Hip internal rotation 38 30p! 38p!  ?Hip external rotation 35 20 42  ?  ?  ?LE MMT: ?  ?MMT Right ?03/29/2021 Left ?03/29/2021 Right ?05/17/2021 Left ?05/17/2021  ?Hip flexion 5/5 4/5 5/5 5/5  ?Hip extension 3+/5 3/5p! 4/5 4/5  ?Hip abduction 3+/5 3/5p! 4/5 4/5p!  ?Hip internal rotation 5/5 5/5 5/5 5/5  ?Hip external rotation 4+/5 4/5p! 5/5 5/5  ? (Blank rows = not tested) ?  ?LUMBAR SPECIAL TESTS:  ?Slump test: (-) BIL ?SLR: (-) BIL ?ASLR: (-) BIL ?Repeated lumbar extension: (+) for centralization of LBP ?Hip scour: (-) BIL ?  ?FUNCTIONAL TESTS:  ?Not performed at this visit ?  ?  ?  ?TODAY'S TREATMENT  ? ?[facility] Adult PT Treatment:                                                DATE: 06/14/2021 ?Therapeutic Exercise: ?*** ?Manual Therapy: ?*** ?Neuromuscular re-ed: ?*** ?Therapeutic Activity: ?*** ?Modalities: ?*** ?Self Care: ?*** ? ? ?[place] Adult PT Treatment:                                                DATE: 06/06/2021 ?Therapeutic Exercise: ?Side knee plank with hip abduction 3x10 BIL ?Sidelying IT band stretch with leg off table x2 min BIL ?55# hex bar dead lift 3x8 ?Standing abdominal press-down with 23# cable 3x10 ?Trunk side bend with 20# cable 2x10 BIL ?Manual Therapy: ?Hooklying manual lateral hip traction with mobilization belt with gentle oscillations x3 min BIL ?Long-axis hip distraction x1 BIL ?Supine long-axis hip grade V manipulation x1 BIL with cavitation ?Neuromuscular re-ed: ?N/A ?Therapeutic Activity: ?N/A ?Modalities: ?N/A ?Self Care: ?N/A ? ? ?[place] Adult PT Treatment:                                                DATE: 05/23/2021 ?Therapeutic Exercise: ?Side knee plank with clams 3x10 BIL ?Dead bugs with YTB around feet 3x20 ?Prone Swimmers with YTB around ankles 3x20 ?Standing abdominal press-down with 23# cable 3x10 ?Standing trunk extension stretch 2x10 ?Manual  Therapy: ?Sidelying lumbar roll grade V manipulation x1 BIL with cavitation ?Neuromuscular re-ed: ?N/A ?Therapeutic Activity: ?Re-assessment of objective information with patient education ?Standard dead lift with 45# barbell 3x8 ?Modalities: ?N/A ?Self Care: ?N/A ? ? ?  ?  ?PATIENT EDUCATION:  ?Education details: Educated on  probable underlying pathophysiology behind her pain presentation, POC, prognosis, and HEP ?Person educated: Patient ?Education method: Explanation, Demonstration, and Handouts ?Education comprehension: verbalized understanding and returned demonstration ?  ?  ?HOME EXERCISE PROGRAM: ?Access Code: [email or web address] ?URL: [email or web address] ?Date: 03/29/2021 ?Prepared by: Vanessa [place] ?  ?Exercises ?Abdominal Isometric Hold - FEET OFF TABLE* - 1 x daily - 7 x weekly - 3 sets - 30-sec hold ?Supine Bridge - 1 x daily - 7 x weekly - 3 sets - 10 reps - 3-sec hold ?Sidelying Hip Abduction - 1 x daily - 7 x weekly - 2 sets - 10 reps - 3-sec hold ?Modified Thomas Stretch - 1 x daily - 7 x weekly - 2 sets - 1-min hold ?  ?  ?ASSESSMENT: ?  ?CLINICAL IMPRESSION: ?*** ?  ?REHAB POTENTIAL: Good ?  ?CLINICAL DECISION MAKING: Stable/uncomplicated ?  ?EVALUATION COMPLEXITY: Low ?  ?  ?GOALS: ?Goals reviewed with patient? Yes ?  ?SHORT TERM GOALS: ?  ?STG Name Target Date Goal status  ?1 Pt will report understanding and adherence to her HEP in order to promote independence in the management of her primary impairments. ?Baseline: HEP provided at eval ?05/23/2021: ACHIEVED 04/26/2021 ACHIEVED  ?  ?LONG TERM GOALS:  ?  ?LTG Name Target Date Goal status  ?1 Pt will achieve a FOTO score of 68% in order to demonstrate improved functional ability as it relates to her LBP. ?Baseline: 63% 05/24/2021 INITIAL  ?2 Pt will achieve BIL global hip strength of 4+/5 or greater in order to return to working out with less limitation. ?Baseline: See MMT chart ?05/17/2021: See Updated MMT Chart 05/24/2021 IN PROGRESS  ?3 Pt  will report ability to sit >1 hour with 0-2/10 pain in order to work through the day with less limitation. ?Baseline: >5/10 pain after 30 minutes of sitting. ?05/23/2021: 5/10 pain after 1 hour of sitting 3/

## 2021-06-14 ENCOUNTER — Ambulatory Visit: Payer: 59

## 2021-06-20 NOTE — Therapy (Incomplete)
?OUTPATIENT PHYSICAL THERAPY TREATMENT NOTE ? ? ?Patient Name: [name] ?MRN: [id number] ?DOB:30-Mar-1965, 56 y.o., female ?Today's Date: 06/20/2021 ? ?PCP: Haydee Salter, MD ?REFERRING PROVIDER: Haydee Salter, MD ? ? ? ? ? ? ? ?Past Medical History:  ?Diagnosis Date  ? Abnormal uterine bleeding (AUB)   ? Arthritis   ? Diabetes mellitus   ? Elevated cholesterol   ? Endometrial polyp   ? Endometrial polyp   ? Endometriosis   ? Hypertension   ? Migraines   ? Nodule   ? Ovarian cyst   ? ?Past Surgical History:  ?Procedure Laterality Date  ? COMBINED HYSTEROSCOPY DIAGNOSTIC / D&C  yrs ago  ? DIAGNOSTIC LAPAROSCOPY  1993  ? with laser adhesions  ? DILITATION & CURRETTAGE/HYSTROSCOPY WITH NOVASURE ABLATION N/A 11/04/2019  ? Procedure: DILATATION & CURETTAGE/HYSTEROSCOPY WITH NOVASURE ABLATION;  Surgeon: Joseph Pierini, MD;  Location: [facility];  Service: Gynecology;  Laterality: N/A;  ? INTRAUTERINE DEVICE INSERTION    ? mirena-Inserted 05-16-14  ? KNEE SURGERY Left yrs ago  ? meniscurs tear repair  ? mirena removed  2021  ? OOPHORECTOMY  2008  ? left  ? ROTATOR CUFF REPAIR Right yrs ago  ? WRIST SURGERY Right   ? gang. cyst  ? ?Patient Active Problem List  ? Diagnosis Date Noted  ? Depression with anxiety 09/27/2020  ? Migraine headache 09/27/2020  ? Insomnia 09/27/2020  ? Thyroid nodule 09/27/2020  ? Obesity (BMI 30.0-34.9) 03/09/2019  ? Acute right-sided low back pain with right-sided sciatica 06/10/2017  ? Trochanteric bursitis, right hip 05/27/2017  ? Atypical chest pain 11/18/2012  ? Type 2 diabetes mellitus ([place]) 11/18/2012  ? Essential hypertension 11/18/2012  ? Hyperlipidemia   ? ? ?REFERRING DIAG: M54.42,M54.41,G89.29 (ICD-10-CM) - Chronic bilateral low back pain with bilateral sciatica ? ?THERAPY DIAG:  ?No diagnosis found. ? ? ?SUBJECTIVE: *** ? ?PAIN:  ?Are you having pain? Yes ?NPRS scale: 0/10 ?Pain location: Low back ?Pain orientation: Bilateral  ?PAIN TYPE: dull and  shooting ?Aggravating factors: prolonged sitting >30 minutes, prolonged standing/ walking >1 hour, and bending forward ?Relieving factors: positional change, once weekly exercise class, pain medications ? ? ? ? ?OBJECTIVE:  ? *Unless otherwise noted, objective measures collected previously* ? ?DIAGNOSTIC FINDINGS:  ?03/02/2021: MR Lumbar Spine WO Contrast: IMPRESSION: ?1. Mild degenerative disc bulging and facet hypertrophy at L2-3 ?through L4-5 with resultant mild bilateral L2 through L4 foraminal ?stenosis. Mild spinal stenosis at the L3-4 level. ?2. Mild to moderate multilevel facet hypertrophy throughout the ?lumbar spine, most pronounced at L4-5 bilaterally. Findings could ?contribute to underlying back pain. ?  ?02/13/2021: XR Bilateral hips 2 views: IMPRESSION: Compared to films 2019 there is no significant  ?interval change.  Mild arthritis of both hips with periarticular spurring  ?proximal acetabulum.  No acute fractures.  Both hips well located. ?  ?PATIENT SURVEYS:  ?FOTO 63%, 68% ?  ?SCREENING FOR RED FLAGS: ?Bowel or bladder incontinence: No ?Cauda equina syndrome: No ?  ?COGNITION: ?         Overall cognitive status: Within functional limits for tasks assessed              ?          ?SENSATION: ?         Light touch: Appears intact ?          ?  ?MUSCLE LENGTH: ?Thomas test: (+) BIL, severe limitation ?  ?POSTURE:  ?Decreased lumbar lordosis ?  ?  PALPATION: ?TTP to lower lumbar paraspinals/ QL, exquisite TTP to Lt trochanter ?  ?PASSIVE ACCESSORIES: ?Painful and hypomobile CPAs from L3-S1 ?  ?LUMBAR AROM ?  ?AROM AROM  ?03/29/2021 AROM ?05/23/2021  ?Flexion 45 with p! on return to standing 80  ?Extension 5p! 13 minor p!  ?Right lateral flexion 12 15  ?Left lateral flexion 15p! 15p!  ?Right rotation 40 55  ?Left rotation 50p! 45  ? (Blank rows = not tested) ?  ?LE AROM/PROM: ?  ?A/PROM Right ?03/29/2021 Left ?03/29/2021 Left ?05/23/2021  ?Hip flexion 110/120 100/ 105 with lateral hip pain 102/110  ?Hip  extension 15/20 5/10 10/12  ?Hip abduction 45/55 30p!/ 35p! 60/65  ?Hip adduction 24/30 5p!/ 10p! 20p! At lateral hip  ?Hip internal rotation 38 30p! 38p!  ?Hip external rotation 35 20 42  ?  ?  ?LE MMT: ?  ?MMT Right ?03/29/2021 Left ?03/29/2021 Right ?05/17/2021 Left ?05/17/2021  ?Hip flexion 5/5 4/5 5/5 5/5  ?Hip extension 3+/5 3/5p! 4/5 4/5  ?Hip abduction 3+/5 3/5p! 4/5 4/5p!  ?Hip internal rotation 5/5 5/5 5/5 5/5  ?Hip external rotation 4+/5 4/5p! 5/5 5/5  ? (Blank rows = not tested) ?  ?LUMBAR SPECIAL TESTS:  ?Slump test: (-) BIL ?SLR: (-) BIL ?ASLR: (-) BIL ?Repeated lumbar extension: (+) for centralization of LBP ?Hip scour: (-) BIL ?  ?FUNCTIONAL TESTS:  ?Not performed at this visit ?  ?  ?  ?TODAY'S TREATMENT  ? ?[facility] Adult PT Treatment:                                                DATE: 06/21/2021 ?Therapeutic Exercise: ?*** ?Manual Therapy: ?*** ?Neuromuscular re-ed: ?*** ?Therapeutic Activity: ?*** ?Modalities: ?*** ?Self Care: ?*** ? ? ?[place] Adult PT Treatment:                                                DATE: 06/06/2021 ?Therapeutic Exercise: ?Side knee plank with hip abduction 3x10 BIL ?Sidelying IT band stretch with leg off table x2 min BIL ?55# hex bar dead lift 3x8 ?Standing abdominal press-down with 23# cable 3x10 ?Trunk side bend with 20# cable 2x10 BIL ?Manual Therapy: ?Hooklying manual lateral hip traction with mobilization belt with gentle oscillations x3 min BIL ?Long-axis hip distraction x1 BIL ?Supine long-axis hip grade V manipulation x1 BIL with cavitation ?Neuromuscular re-ed: ?N/A ?Therapeutic Activity: ?N/A ?Modalities: ?N/A ?Self Care: ?N/A ? ? ?[place] Adult PT Treatment:                                                DATE: 05/23/2021 ?Therapeutic Exercise: ?Side knee plank with clams 3x10 BIL ?Dead bugs with YTB around feet 3x20 ?Prone Swimmers with YTB around ankles 3x20 ?Standing abdominal press-down with 23# cable 3x10 ?Standing trunk extension stretch 2x10 ?Manual  Therapy: ?Sidelying lumbar roll grade V manipulation x1 BIL with cavitation ?Neuromuscular re-ed: ?N/A ?Therapeutic Activity: ?Re-assessment of objective information with patient education ?Standard dead lift with 45# barbell 3x8 ?Modalities: ?N/A ?Self Care: ?N/A ? ? ?  ?  ?PATIENT EDUCATION:  ?Education details: Educated on  probable underlying pathophysiology behind her pain presentation, POC, prognosis, and HEP ?Person educated: Patient ?Education method: Explanation, Demonstration, and Handouts ?Education comprehension: verbalized understanding and returned demonstration ?  ?  ?HOME EXERCISE PROGRAM: ?Access Code: [email or web address] ?URL: [email or web address] ?Date: 03/29/2021 ?Prepared by: Vanessa [place] ?  ?Exercises ?Abdominal Isometric Hold - FEET OFF TABLE* - 1 x daily - 7 x weekly - 3 sets - 30-sec hold ?Supine Bridge - 1 x daily - 7 x weekly - 3 sets - 10 reps - 3-sec hold ?Sidelying Hip Abduction - 1 x daily - 7 x weekly - 2 sets - 10 reps - 3-sec hold ?Modified Thomas Stretch - 1 x daily - 7 x weekly - 2 sets - 1-min hold ?  ?  ?ASSESSMENT: ?  ?CLINICAL IMPRESSION: ?*** ?  ?REHAB POTENTIAL: Good ?  ?CLINICAL DECISION MAKING: Stable/uncomplicated ?  ?EVALUATION COMPLEXITY: Low ?  ?  ?GOALS: ?Goals reviewed with patient? Yes ?  ?SHORT TERM GOALS: ?  ?STG Name Target Date Goal status  ?1 Pt will report understanding and adherence to her HEP in order to promote independence in the management of her primary impairments. ?Baseline: HEP provided at eval ?05/23/2021: ACHIEVED 04/26/2021 ACHIEVED  ?  ?LONG TERM GOALS:  ?  ?LTG Name Target Date Goal status  ?1 Pt will achieve a FOTO score of 68% in order to demonstrate improved functional ability as it relates to her LBP. ?Baseline: 63% 05/24/2021 INITIAL  ?2 Pt will achieve BIL global hip strength of 4+/5 or greater in order to return to working out with less limitation. ?Baseline: See MMT chart ?05/17/2021: See Updated MMT Chart 05/24/2021 IN PROGRESS  ?3 Pt  will report ability to sit >1 hour with 0-2/10 pain in order to work through the day with less limitation. ?Baseline: >5/10 pain after 30 minutes of sitting. ?05/23/2021: 5/10 pain after 1 hour of sitting 05/24/2021 IN PROGRESS

## 2021-06-21 ENCOUNTER — Ambulatory Visit: Payer: 59

## 2021-06-26 ENCOUNTER — Ambulatory Visit: Payer: 59 | Admitting: Family Medicine

## 2021-07-19 ENCOUNTER — Ambulatory Visit: Payer: 59 | Admitting: Family Medicine

## 2021-07-19 VITALS — BP 124/82 | HR 85 | Temp 97.6°F | Ht 59.0 in | Wt 174.2 lb

## 2021-07-19 DIAGNOSIS — E785 Hyperlipidemia, unspecified: Secondary | ICD-10-CM | POA: Diagnosis not present

## 2021-07-19 DIAGNOSIS — Z6835 Body mass index (BMI) 35.0-35.9, adult: Secondary | ICD-10-CM

## 2021-07-19 DIAGNOSIS — E119 Type 2 diabetes mellitus without complications: Secondary | ICD-10-CM | POA: Diagnosis not present

## 2021-07-19 DIAGNOSIS — F418 Other specified anxiety disorders: Secondary | ICD-10-CM

## 2021-07-19 DIAGNOSIS — I10 Essential (primary) hypertension: Secondary | ICD-10-CM

## 2021-07-19 LAB — HEMOGLOBIN A1C: Hgb A1c MFr Bld: 6.5 % (ref 4.6–6.5)

## 2021-07-19 LAB — MICROALBUMIN / CREATININE URINE RATIO
Creatinine,U: 21.6 mg/dL
Microalb Creat Ratio: 3.2 mg/g (ref 0.0–30.0)
Microalb, Ur: <0.7 mg/dL (ref 0.0–1.9)

## 2021-07-19 LAB — GLUCOSE, RANDOM: Glucose, Bld: 90 mg/dL (ref 70–99)

## 2021-07-19 NOTE — Progress Notes (Signed)
[facility] [address] [place] [address] Dept: [phone] Dept Fax: [phone]  Chronic Care Office Visit  Subjective:    Patient ID: [name], female    DOB: Nov 18, 1965, 56 y.o..   MRN: [id number]  Chief Complaint  Patient presents with   Follow-up    3 month f/u.  No concerns.       History of Present Illness:  Patient is in today for reassessment of chronic medical issues.  Ms. Greb has a history of hypertension, managed on Benicar (olmesartan/HCTZ).   Ms. Heagle has a history of Type 2 diabetes. She is managed on Januvia. We stopped her metformin last summer. She does not want ot have to go back on metformin if she can avoid this. She has had some weight gain over the past 9 months. She states she had gotten away from her diet and exercise routine. More recently, she has returned back to exercising. She has access to a workout room at her job. sh eis doing circuit training on Tues/Thurs and mixed cardio M/W/F/Sa.   Ms. Skousen has a history of hyperlipidemia and is managed on Crestor.   Ms. Landers has a history of depression with anxiety. She is doing well on Paxil.  Past Medical History: Patient Active Problem List   Diagnosis Date Noted   Depression with anxiety 09/27/2020   Migraine headache 09/27/2020   Insomnia 09/27/2020   Thyroid nodule 09/27/2020   Class 2 obesity due to excess calories with body mass index (BMI) of 35.0 to 35.9 in adult 03/09/2019   Acute right-sided low back pain with right-sided sciatica 06/10/2017   Trochanteric bursitis, right hip 05/27/2017   Atypical chest pain 11/18/2012   Type 2 diabetes mellitus ([place]) 11/18/2012   Essential hypertension 11/18/2012   Hyperlipidemia    Past Surgical History:  Procedure Laterality Date   COMBINED HYSTEROSCOPY DIAGNOSTIC / D&C  yrs ago   [place]   with laser adhesions   DILITATION &  CURRETTAGE/HYSTROSCOPY WITH NOVASURE ABLATION N/A 11/04/2019   Procedure: DILATATION & CURETTAGE/HYSTEROSCOPY WITH NOVASURE ABLATION;  Surgeon: Joseph Pierini, MD;  Location: [place];  Service: Gynecology;  Laterality: N/A;   INTRAUTERINE DEVICE INSERTION     mirena-Inserted 05-16-14   KNEE SURGERY Left yrs ago   meniscurs tear repair   mirena removed  2021   OOPHORECTOMY  2008   left   ROTATOR CUFF REPAIR Right yrs ago   WRIST SURGERY Right    gang. cyst   Family History  Problem Relation Age of Onset   Hypertension Mother    Diabetes Mother    COPD Mother    Cancer Mother        Lung   Hypertension Father    Stroke Father 40   Diabetes Sister    Hypertension Brother    Diabetes Maternal Aunt    Cancer Maternal Aunt        Kidney   Kidney disease Maternal Aunt    Diabetes Maternal Aunt    Stroke Maternal Grandfather    Diabetes Maternal Grandfather    Colon cancer Neg Hx    Esophageal cancer Neg Hx    Rectal cancer Neg Hx    Stomach cancer Neg Hx    Outpatient Medications Prior to Visit  Medication Sig Dispense Refill   acetaminophen (TYLENOL) 500 MG tablet Take 2 tablets (1,000 mg total) by mouth every 6 (six) hours as needed for mild  pain.     Ascorbic Acid (VITAMIN C) 1000 MG tablet Take 1,000 mg by mouth daily.     cholecalciferol (VITAMIN D3) 25 MCG (1000 UNIT) tablet Take 1,000 Units by mouth daily.     clonazePAM (KLONOPIN) 0.5 MG tablet TAKE 1 TABLET BY MOUTH 2 TIMES DAILY AS NEEDED FOR ANXIETY. 30 tablet 1   diclofenac (VOLTAREN) 75 MG EC tablet Take 1 tablet (75 mg total) by mouth 2 (two) times daily between meals as needed. 60 tablet 3   gabapentin (NEURONTIN) 100 MG capsule Take 1 capsule (100 mg total) by mouth 3 (three) times daily as needed. 60 capsule 1   JANUVIA 100 MG tablet TAKE 1 TABLET BY MOUTH DAILY AFTER BREAKFAST. 90 tablet 3   nabumetone (RELAFEN) 500 MG tablet Take 1 tablet (500 mg total) by mouth 2 (two) times daily as  needed. 60 tablet 2   olmesartan-hydrochlorothiazide (BENICAR HCT) 40-12.5 MG tablet TAKE 1 TABLET BY MOUTH EVERY DAY 90 tablet 3   PARoxetine (PAXIL) 20 MG tablet Take 1 tablet (20 mg total) by mouth daily. 90 tablet 3   rizatriptan (MAXALT-MLT) 10 MG disintegrating tablet TAKE 1 TABLET BY MOUTH AS NEEDED FOR MIGRAINE. MAY REPEAT IN 2 HOURS IF NEEDED 10 tablet 6   rosuvastatin (CRESTOR) 10 MG tablet TAKE 1 TABLET BY MOUTH EVERY DAY WITH BREAKFAST 90 tablet 3   tiZANidine (ZANAFLEX) 4 MG tablet Take 1 tablet (4 mg total) by mouth every 8 (eight) hours as needed for muscle spasms. 40 tablet 0   traMADol (ULTRAM) 50 MG tablet Take 1 tablet (50 mg total) by mouth every 6 (six) hours as needed. 30 tablet 0   zinc gluconate 50 MG tablet Take 50 mg by mouth daily.     No facility-administered medications prior to visit.   Allergies  Allergen Reactions   Dilaudid [Hydromorphone Hcl] Other (See Comments)    Broke in sweat and started shaking, can take oral   Sulfa Antibiotics Hives   Tylox [Oxycodone-Acetaminophen] Nausea And Vomiting   Clindamycin/Lincomycin Rash    hives    Objective:   Today's Vitals   07/19/21 1127  BP: 124/82  Pulse: 85  Temp: 97.6 F (36.4 C)  TempSrc: Temporal  SpO2: 97%  Weight: 174 lb 3.2 oz (79 kg)  Height: '4\' 11"'$  (1.499 m)   Body mass index is 35.18 kg/m.   General: Well developed, well nourished. No acute distress. Psych: Alert and oriented. Normal mood and affect.  Health Maintenance Due  Topic Date Due   Zoster Vaccines- Shingrix (2 of 2) 12/29/2020   OPHTHALMOLOGY EXAM  01/31/2021   PAP SMEAR-Modifier  08/29/2021   Lab Results Last lipids Lab Results  Component Value Date   CHOL 158 03/28/2021   HDL 75.20 03/28/2021   LDLCALC 70 03/28/2021   TRIG 61.0 03/28/2021   CHOLHDL 2 03/28/2021   Last hemoglobin A1c Lab Results  Component Value Date   HGBA1C 6.5 03/28/2021   Assessment & Plan:   1. Type 2 diabetes mellitus without  complication, without long-term current use of insulin (HCC) Diabetes has been in good control, but Ms. Ricciardelli has had some weight gain over the past 9 months. We will continue Januvia 100 mg daily. I will check her A1c. Due for annual micral.  - Glucose, random - Hemoglobin A1c - Microalbumin / creatinine urine ratio  2. Essential hypertension Blood pressure is in good control. I will continue Benicar 40-12.5 mg daily.  3. Class 2 severe  obesity due to excess calories with serious comorbidity and body mass index (BMI) of 35.0 to 35.9 in adult [facility]) Weight is up 18 lbs since Sept. 2022. Ms. Balster has re-engaged recently in more physical activity. I encouraged her to keep up with this.  4. Hyperlipidemia, unspecified hyperlipidemia type Lipids are at goal. Continue rosuvastatin 10 mg daily.  Return in about 3 months (around 10/19/2021) for Reassessment.   Haydee Salter, MD

## 2021-08-15 ENCOUNTER — Ambulatory Visit (INDEPENDENT_AMBULATORY_CARE_PROVIDER_SITE_OTHER): Payer: 59

## 2021-08-15 ENCOUNTER — Encounter: Payer: Self-pay | Admitting: Physician Assistant

## 2021-08-15 ENCOUNTER — Ambulatory Visit (INDEPENDENT_AMBULATORY_CARE_PROVIDER_SITE_OTHER): Payer: 59 | Admitting: Physician Assistant

## 2021-08-15 DIAGNOSIS — M542 Cervicalgia: Secondary | ICD-10-CM | POA: Diagnosis not present

## 2021-08-15 DIAGNOSIS — G8929 Other chronic pain: Secondary | ICD-10-CM

## 2021-08-15 DIAGNOSIS — M25512 Pain in left shoulder: Secondary | ICD-10-CM

## 2021-08-15 MED ORDER — TRAMADOL HCL 50 MG PO TABS: 50.0000 mg | ORAL_TABLET | Freq: Four times a day (QID) | ORAL | 0 refills | Status: DC | PRN

## 2021-08-15 MED ORDER — GABAPENTIN 100 MG PO CAPS: 100.0000 mg | ORAL_CAPSULE | Freq: Three times a day (TID) | ORAL | 1 refills | Status: DC | PRN

## 2021-08-15 MED ORDER — TIZANIDINE HCL 4 MG PO TABS: 4.0000 mg | ORAL_TABLET | Freq: Three times a day (TID) | ORAL | 0 refills | Status: DC | PRN

## 2021-08-15 NOTE — Progress Notes (Signed)
Office Visit Note   Patient: [name]           Date of Birth: 1966-02-01           MRN: [id number] Visit Date: 08/15/2021              Requested by: Haydee Salter, MD [place],   [id number] PCP: Haydee Salter, MD   Assessment & Plan: Visit Diagnoses:  1. Chronic left shoulder pain   2. Neck pain     Plan: We will send her for formal physical therapy for her neck to work on range of motion stretching, modalities and a home exercise program.  She is placed on Zanaflex, Neurontin and tramadol.  Have her follow-up with [place] in 4 weeks see how she is doing.  Questions were encouraged and answered at length.  She will return sooner if there is any change in her overall symptoms.  Follow-Up Instructions: Return in about 4 weeks (around 09/12/2021).   Orders:  Orders Placed This Encounter  Procedures   XR Shoulder Left   XR Cervical Spine 2 or 3 views   Meds ordered this encounter  Medications   gabapentin (NEURONTIN) 100 MG capsule    Sig: Take 1 capsule (100 mg total) by mouth 3 (three) times daily as needed.    Dispense:  60 capsule    Refill:  1   tiZANidine (ZANAFLEX) 4 MG tablet    Sig: Take 1 tablet (4 mg total) by mouth every 8 (eight) hours as needed for muscle spasms.    Dispense:  40 tablet    Refill:  0   traMADol (ULTRAM) 50 MG tablet    Sig: Take 1 tablet (50 mg total) by mouth every 6 (six) hours as needed.    Dispense:  30 tablet    Refill:  0      Procedures: No procedures performed   Clinical Data: No additional findings.   Subjective: Chief Complaint  Patient presents with   Left Shoulder - Pain   Neck - Pain    HPI [name] comes in today for neck and left shoulder pain.  She states she has had pain for about 3 to 4 weeks no known injury.  Pain is mostly at the top of her shoulder and in her neck region.  She denies any radicular symptoms down the arm.  Does describe it as burning sensation  points to the top of her shoulder more in the trapezius region.  She has tried [employer], Voltaren gel, heat, stretching and ibuprofen with no relief.  She does state the pain awakens her.  She is diabetic reports her last hemoglobin A1c was 6.5.  Review of Systems  Constitutional:  Negative for chills and fever.  Musculoskeletal:  Positive for neck pain.     Objective: Vital Signs: LMP  (LMP Unknown)   Physical Exam Constitutional:      Appearance: She is not ill-appearing or diaphoretic.  Pulmonary:     Effort: Pulmonary effort is normal.  Neurological:     Mental Status: She is alert and oriented to person, place, and time.  Psychiatric:        Mood and Affect: Mood normal.     Ortho Exam Cervical spine full flexion extension negative Spurling's.  Tenderness over the lower cervical spinal palpation.  Nontender over the medial border of scapula bilaterally.  Good range of motion bilateral shoulders without pain.  5 out of  5 strength throughout bilateral shoulders with external and internal rotation.  Empty can test negative bilaterally.  Impingement testing negative bilaterally.  Liftoff test negative bilaterally.  Upper extremity strength is 5 out of 5 throughout.  Sensation grossly intact bilateral hands full motor bilateral hands.  Radial pulses are 2+ and equal and symmetric. Specialty Comments:  MRI LUMBAR SPINE WITHOUT CONTRAST   TECHNIQUE: Multiplanar, multisequence MR imaging of the lumbar spine was performed. No intravenous contrast was administered.   COMPARISON:  Comparison made with prior radiograph from 02/13/2021 as well as previous MRI from 02/27/2012.   FINDINGS: Segmentation: Standard. Lowest well-formed disc space labeled the L5-S1 level.   Alignment: Trace facet mediated anterolisthesis of L4 on L5. Alignment otherwise normal preservation of the normal lumbar lordosis.   Vertebrae: Vertebral body height well maintained without acute or chronic  fracture. Bone marrow signal intensity within normal limits. No discrete or worrisome osseous lesions. Mild reactive marrow edema present about the L4-5 facets due to facet arthritis. No other abnormal marrow edema.   Conus medullaris and cauda equina: Conus extends to the L1 level. Conus and cauda equina appear normal.   Paraspinal and other soft tissues: Unremarkable.   Disc levels:   L1-2:  Unremarkable.   L2-3: Mild circumferential disc bulge with disc desiccation and intervertebral disc space narrowing. Minimal facet hypertrophy. No spinal stenosis. Mild bilateral L2 foraminal narrowing, slightly worse on the right.   L3-4: Disc desiccation with mild circumferential disc bulge and intervertebral disc space narrowing. Mild-to-moderate bilateral facet hypertrophy. Resultant mild spinal stenosis. Mild right worse than left L3 foraminal narrowing.   L4-5: Trace anterolisthesis. Mild disc bulge with disc desiccation. Moderate bilateral facet hypertrophy. No significant spinal stenosis. Mild bilateral L4 foraminal narrowing.   L5-S1: Negative interspace. Moderate left with mild right facet hypertrophy. No spinal stenosis. Foramina remain patent.   IMPRESSION: 1. Mild degenerative disc bulging and facet hypertrophy at L2-3 through L4-5 with resultant mild bilateral L2 through L4 foraminal stenosis. Mild spinal stenosis at the L3-4 level. 2. Mild to moderate multilevel facet hypertrophy throughout the lumbar spine, most pronounced at L4-5 bilaterally. Findings could contribute to underlying back pain.     Electronically Signed   By: Jeannine Boga M.D.   On: 03/05/2021 02:45  Imaging: XR Shoulder Left  Result Date: 08/15/2021 Left shoulder 3 views: Shoulder is well located.  Glenohumeral joints well-maintained.  No acute fractures.  Subacromial space well-maintained.  No significant arthritic changes.  XR Cervical Spine 2 or 3 views  Result Date:  08/15/2021 Cervical spine 2 views: No acute fracture.  Loss of lordotic curvature.  Endplate spurring with with bridging at see 6 C7 and C7 -T1.  No spondylolisthesis.  No acute fractures.  Disc base overall well-maintained.    PMFS History: Patient Active Problem List   Diagnosis Date Noted   Depression with anxiety 09/27/2020   Migraine headache 09/27/2020   Insomnia 09/27/2020   Thyroid nodule 09/27/2020   Class 2 obesity due to excess calories with body mass index (BMI) of 35.0 to 35.9 in adult 03/09/2019   Acute right-sided low back pain with right-sided sciatica 06/10/2017   Trochanteric bursitis, right hip 05/27/2017   Atypical chest pain 11/18/2012   Type 2 diabetes mellitus ([place]) 11/18/2012   Essential hypertension 11/18/2012   Hyperlipidemia    Past Medical History:  Diagnosis Date   Abnormal uterine bleeding (AUB)    Arthritis    Diabetes mellitus    Elevated cholesterol  Endometrial polyp    Endometrial polyp    Endometriosis    Hypertension    Migraines    Nodule    Ovarian cyst     Family History  Problem Relation Age of Onset   Hypertension Mother    Diabetes Mother    COPD Mother    Cancer Mother        Lung   Hypertension Father    Stroke Father 55   Diabetes Sister    Hypertension Brother    Diabetes Maternal Aunt    Cancer Maternal Aunt        Kidney   Kidney disease Maternal Aunt    Diabetes Maternal Aunt    Stroke Maternal Grandfather    Diabetes Maternal Grandfather    Colon cancer Neg Hx    Esophageal cancer Neg Hx    Rectal cancer Neg Hx    Stomach cancer Neg Hx     Past Surgical History:  Procedure Laterality Date   COMBINED HYSTEROSCOPY DIAGNOSTIC / D&C  yrs ago   [place]   with laser adhesions   DILITATION & CURRETTAGE/HYSTROSCOPY WITH NOVASURE ABLATION N/A 11/04/2019   Procedure: DILATATION & CURETTAGE/HYSTEROSCOPY WITH NOVASURE ABLATION;  Surgeon: Joseph Pierini, MD;  Location: [place];  Service: Gynecology;  Laterality: N/A;   INTRAUTERINE DEVICE INSERTION     mirena-Inserted 05-16-14   KNEE SURGERY Left yrs ago   meniscurs tear repair   mirena removed  2021   OOPHORECTOMY  2008   left   ROTATOR CUFF REPAIR Right yrs ago   WRIST SURGERY Right    gang. cyst   Social History   Occupational History   Not on file  Tobacco Use   Smoking status: Never   Smokeless tobacco: Never  Vaping Use   Vaping Use: Never used  Substance and Sexual Activity   Alcohol use: Not Currently   Drug use: No   Sexual activity: Yes    Birth control/protection: Post-menopausal

## 2021-08-15 NOTE — Addendum Note (Signed)
Addended by: Robyne Peers on: 08/15/2021 04:39 PM   Modules accepted: Orders

## 2021-09-02 NOTE — Telephone Encounter (Signed)
Please advise 

## 2021-09-02 NOTE — Telephone Encounter (Signed)
Can you look into this for me.

## 2021-09-06 ENCOUNTER — Telehealth: Payer: Self-pay | Admitting: Physician Assistant

## 2021-09-06 NOTE — Telephone Encounter (Signed)
Patient states that she has not gotten a phone call from PT yet, so she wanted to cancel the appointment for Monday. She would also like to know when she may be getting a phone call to set up her PT, she said it's been over 3 weeks since she was last seen. CB # [phone]

## 2021-09-09 ENCOUNTER — Ambulatory Visit: Payer: 59 | Admitting: Physician Assistant

## 2021-09-09 ENCOUNTER — Ambulatory Visit: Payer: 59 | Admitting: Obstetrics & Gynecology

## 2021-09-11 NOTE — Telephone Encounter (Signed)
Called and advised pt. She stated they lvm yesterday and she will cb today

## 2021-10-11 ENCOUNTER — Ambulatory Visit: Payer: 59 | Admitting: Obstetrics & Gynecology

## 2021-10-18 ENCOUNTER — Ambulatory Visit: Payer: 59 | Admitting: Family Medicine

## 2021-10-27 ENCOUNTER — Emergency Department (HOSPITAL_COMMUNITY)
Admission: EM | Admit: 2021-10-27 | Discharge: 2021-10-27 | Disposition: A | Discharge status: Home / Self Care | Payer: 59 | Attending: Emergency Medicine | Admitting: Emergency Medicine

## 2021-10-27 ENCOUNTER — Encounter (HOSPITAL_COMMUNITY): Payer: Self-pay

## 2021-10-27 ENCOUNTER — Emergency Department (HOSPITAL_COMMUNITY): Payer: 59

## 2021-10-27 DIAGNOSIS — M542 Cervicalgia: Secondary | ICD-10-CM | POA: Diagnosis not present

## 2021-10-27 DIAGNOSIS — Y92410 Unspecified street and highway as the place of occurrence of the external cause: Secondary | ICD-10-CM | POA: Diagnosis not present

## 2021-10-27 DIAGNOSIS — R519 Headache, unspecified: Secondary | ICD-10-CM | POA: Diagnosis present

## 2021-10-27 DIAGNOSIS — R42 Dizziness and giddiness: Secondary | ICD-10-CM | POA: Diagnosis not present

## 2021-10-27 MED ORDER — METHOCARBAMOL 500 MG PO TABS: 500.0000 mg | ORAL_TABLET | Freq: Two times a day (BID) | ORAL | 0 refills | Status: DC

## 2021-10-27 MED ORDER — ACETAMINOPHEN 500 MG PO TABS
1000.0000 mg | ORAL_TABLET | Freq: Once | ORAL | Status: AC
  Administered 2021-10-27: 1000 mg via ORAL
  Filled 2021-10-27: qty 2

## 2021-10-27 MED ORDER — HYDROCODONE-ACETAMINOPHEN 5-325 MG PO TABS
1.0000 | ORAL_TABLET | Freq: Once | ORAL | Status: AC
  Administered 2021-10-27: 1 via ORAL
  Filled 2021-10-27: qty 1

## 2021-10-27 NOTE — Discharge Instructions (Signed)
Imaging of your head and neck was unremarkable for acute findings. I recommend that you take your muscle relaxer medication with tylenol for pain control. I also recommend that you rest, ice, compress, and elevate areas that are painful. Do not drive or operate heavy machinery on Robaxin as it can be sedating. Please be aware that you will likely be more sore over the next 24-48 hours. Follow-up with your pcp as needed for continued evaluation and management of your symptoms.   Return if development of any new or worsening symptoms
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
## 2021-11-05 ENCOUNTER — Encounter: Payer: Self-pay | Admitting: Family Medicine

## 2021-11-05 ENCOUNTER — Ambulatory Visit: Payer: 59 | Admitting: Family Medicine

## 2021-11-05 VITALS — BP 126/80 | HR 80 | Temp 98.2°F | Ht 59.0 in | Wt 185.4 lb

## 2021-11-05 DIAGNOSIS — M542 Cervicalgia: Secondary | ICD-10-CM
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
## 2021-11-08 ENCOUNTER — Ambulatory Visit: Payer: 59 | Attending: Family Medicine | Admitting: Physical Therapy

## 2021-11-08 ENCOUNTER — Encounter: Payer: Self-pay | Admitting: Physical Therapy

## 2021-11-08 ENCOUNTER — Other Ambulatory Visit: Payer: Self-pay

## 2021-11-08 DIAGNOSIS — M542 Cervicalgia: Secondary | ICD-10-CM | POA: Insufficient documentation

## 2021-11-08 DIAGNOSIS — R293 Abnormal posture: Secondary | ICD-10-CM | POA: Diagnosis present

## 2021-11-08 DIAGNOSIS — M6281 Muscle weakness (generalized): Secondary | ICD-10-CM | POA: Insufficient documentation

## 2021-11-11 ENCOUNTER — Ambulatory Visit: Payer: 59 | Admitting: Physical Therapy

## 2021-11-11 ENCOUNTER — Ambulatory Visit: Payer: 59 | Admitting: Obstetrics & Gynecology

## 2021-11-11 ENCOUNTER — Encounter: Payer: Self-pay | Admitting: Physical Therapy

## 2021-11-11 DIAGNOSIS — M6281 Muscle weakness (generalized): Secondary | ICD-10-CM

## 2021-11-11 DIAGNOSIS — M542 Cervicalgia: Secondary | ICD-10-CM | POA: Diagnosis not present

## 2021-11-11 DIAGNOSIS — R293 Abnormal posture: Secondary | ICD-10-CM
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
## 2021-11-12 ENCOUNTER — Ambulatory Visit (INDEPENDENT_AMBULATORY_CARE_PROVIDER_SITE_OTHER): Payer: 59 | Admitting: Family Medicine

## 2021-11-12 ENCOUNTER — Encounter: Payer: Self-pay | Admitting: Family Medicine

## 2021-11-12 VITALS — BP 120/70 | HR 84 | Temp 97.4°F | Ht 59.0 in | Wt 183.2 lb

## 2021-11-12 DIAGNOSIS — Z23 Encounter for immunization: Secondary | ICD-10-CM | POA: Diagnosis not present

## 2021-11-12 DIAGNOSIS — E119 Type 2 diabetes mellitus without complications: Secondary | ICD-10-CM | POA: Diagnosis not present

## 2021-11-12 DIAGNOSIS — E785 Hyperlipidemia, unspecified: Secondary | ICD-10-CM

## 2021-11-12 DIAGNOSIS — Z6837 Body mass index (BMI) 37.0-37.9, adult: Secondary | ICD-10-CM

## 2021-11-12 DIAGNOSIS — I10 Essential (primary) hypertension: Secondary | ICD-10-CM

## 2021-11-12 LAB — HEMOGLOBIN A1C: Hgb A1c MFr Bld: 7.3 % — ABNORMAL HIGH (ref 4.6–6.5)

## 2021-11-12 LAB — GLUCOSE, RANDOM: Glucose, Bld: 113 mg/dL — ABNORMAL HIGH (ref 70–99)

## 2021-11-12 MED ORDER — ROSUVASTATIN CALCIUM 10 MG PO TABS: 10.0000 mg | ORAL_TABLET | Freq: Every day | ORAL | 3 refills | Status: DC

## 2021-11-12 NOTE — Addendum Note (Signed)
Addended by: Konrad Saha on: 11/12/2021 08:35 AM   Modules accepted: Orders

## 2021-11-12 NOTE — Progress Notes (Signed)
[facility] [address] [place] [address] Dept: [phone] Dept Fax: [phone]  Chronic Care Office Visit  Subjective:    Patient ID: [name], female    DOB: [birth year], 56 y.o..   MRN: [id number]  Chief Complaint  Patient presents with   Follow-up    3 month f/u.  Fasting today.  No concerns.   Wants flu shot today.   Average BS 80-125    History of Present Illness:  Patient is in today for reassessment of chronic medical issues.  Ms. [name] has a history of hypertension, managed on Benicar (olmesartan/HCTZ) 40-12.5 mg daily.   Ms. [name] has a history of Type 2 diabetes. She is managed on sitagliptin (Januvia) 100 mg daily. We stopped her metformin last summer. She does not want to have to go back on metformin if she can avoid this. She has had some weight gain over the past 9 months. She states she had gotten away from her diet and exercise routine. More recently, she has returned back to exercising.    Ms. [name] has a history of hyperlipidemia and is managed on rosuvastatin 10 mg daily.   Past Medical History: Patient Active Problem List   Diagnosis Date Noted   Depression with anxiety 09/27/2020   Migraine headache 09/27/2020   Insomnia 09/27/2020   Thyroid nodule 09/27/2020   Class 2 obesity due to excess calories with body mass index (BMI) of 37.0 to 37.9 in adult 03/09/2019   Acute right-sided low back pain with right-sided sciatica 06/10/2017   Trochanteric bursitis, right hip 05/27/2017   Atypical chest pain 11/18/2012   Type 2 diabetes mellitus ([name]) 11/18/2012   Essential hypertension 11/18/2012   Hyperlipidemia    Past Surgical History:  Procedure Laterality Date   COMBINED HYSTEROSCOPY DIAGNOSTIC / D&C  yrs ago   [place]   with laser adhesions   DILITATION & CURRETTAGE/HYSTROSCOPY WITH NOVASURE ABLATION N/A 11/04/2019   Procedure: DILATATION &  CURETTAGE/HYSTEROSCOPY WITH NOVASURE ABLATION;  Surgeon: Joseph Pierini, MD;  Location: [facility];  Service: Gynecology;  Laterality: N/A;   INTRAUTERINE DEVICE INSERTION     mirena-Inserted 05-16-14   KNEE SURGERY Left yrs ago   meniscurs tear repair   mirena removed  2021   OOPHORECTOMY  2008   left   ROTATOR CUFF REPAIR Right yrs ago   WRIST SURGERY Right    gang. cyst   Family History  Problem Relation Age of Onset   Hypertension Mother    Diabetes Mother    COPD Mother    Cancer Mother        Lung   Hypertension Father    Stroke Father 48   Diabetes Sister    Hypertension Brother    Diabetes Maternal Aunt    Cancer Maternal Aunt        Kidney   Kidney disease Maternal Aunt    Diabetes Maternal Aunt    Stroke Maternal Grandfather    Diabetes Maternal Grandfather    Colon cancer Neg Hx    Esophageal cancer Neg Hx    Rectal cancer Neg Hx    Stomach cancer Neg Hx    Outpatient Medications Prior to Visit  Medication Sig Dispense Refill   acetaminophen (TYLENOL) 500 MG tablet Take 2 tablets (1,000 mg total) by mouth every 6 (six) hours as needed for mild pain.     Ascorbic Acid (VITAMIN C) 1000 MG tablet Take 1,000 mg  by mouth daily.     cholecalciferol (VITAMIN D3) 25 MCG (1000 UNIT) tablet Take 1,000 Units by mouth daily.     clonazePAM (KLONOPIN) 0.5 MG tablet TAKE 1 TABLET BY MOUTH 2 TIMES DAILY AS NEEDED FOR ANXIETY. 30 tablet 1   gabapentin (NEURONTIN) 100 MG capsule Take 1 capsule (100 mg total) by mouth 3 (three) times daily as needed. 60 capsule 1   JANUVIA 100 MG tablet TAKE 1 TABLET BY MOUTH DAILY AFTER BREAKFAST. 90 tablet 3   methocarbamol (ROBAXIN) 500 MG tablet Take 1 tablet (500 mg total) by mouth 2 (two) times daily. 20 tablet 0   nabumetone (RELAFEN) 500 MG tablet Take 1 tablet (500 mg total) by mouth 2 (two) times daily as needed. 60 tablet 2   olmesartan-hydrochlorothiazide (BENICAR HCT) 40-12.5 MG tablet TAKE 1 TABLET BY MOUTH EVERY  DAY 90 tablet 3   rosuvastatin (CRESTOR) 10 MG tablet TAKE 1 TABLET BY MOUTH EVERY DAY WITH BREAKFAST 90 tablet 3   tiZANidine (ZANAFLEX) 4 MG tablet Take 1 tablet (4 mg total) by mouth every 8 (eight) hours as needed for muscle spasms. 40 tablet 0   zinc gluconate 50 MG tablet Take 50 mg by mouth daily.     PARoxetine (PAXIL) 20 MG tablet Take 1 tablet (20 mg total) by mouth daily. 90 tablet 3   No facility-administered medications prior to visit.   Allergies  Allergen Reactions   Dilaudid [Hydromorphone Hcl] Other (See Comments)    Broke in sweat and started shaking, can take oral   Sulfa Antibiotics Hives   Tylox [Oxycodone-Acetaminophen] Nausea And Vomiting   Clindamycin/Lincomycin Rash    hives    Objective:   Today's Vitals   11/12/21 0802  BP: 120/70  Pulse: 84  Temp: (!) 97.4 F (36.3 C)  TempSrc: Temporal  SpO2: 94%  Weight: 183 lb 3.2 oz (83.1 kg)  Height: '4\' 11"'$  (1.499 m)   Body mass index is 37 kg/m.   General: Well developed, well nourished. No acute distress. HEENT: Normocephalic, non-traumatic. External ears normal. EAC and TMs normal bilaterally. PERRL, EOMI. Conjunctiva clear. Fundiscopic exam shows normal disc and vasculature. Nose   clear without congestion or rhinorrhea. Mucous membranes moist. Oropharynx clear. Good dentition. Neck: Supple. No lymphadenopathy. No thyromegaly. Lungs: Clear to auscultation bilaterally. No wheezing, rales or rhonchi. CV: RRR without murmurs or rubs. Pulses 2+ bilaterally. Abdomen: Soft, non-tender. Bowel sounds positive, normal pitch and frequency. No hepatosplenomegaly. No rebound or guarding. Back: Straight. No CVA tenderness bilaterally. Extremities: Full ROM. No joint swelling or tenderness. No edema noted. Skin: Warm and dry. No rashes. Neuro: CN II-XII intact. Normal sensation and DTR bilaterally. Psych: Alert and oriented. Normal mood and affect.  Health Maintenance Due  Topic Date Due   Zoster Vaccines-  Shingrix (2 of 2) 12/29/2020   PAP SMEAR-Modifier  08/29/2021   INFLUENZA VACCINE  10/01/2021   Lab Results Last lipids Lab Results  Component Value Date   CHOL 158 03/28/2021   HDL 75.20 03/28/2021   LDLCALC 70 03/28/2021   TRIG 61.0 03/28/2021   CHOLHDL 2 03/28/2021   Last hemoglobin A1c Lab Results  Component Value Date   HGBA1C 6.5 07/19/2021   Assessment & Plan:   1. Type 2 diabetes mellitus without complication, without long-term current use of insulin ([place]) Ms. Hoey' A1c has been at goal. We will recheck on this today. She will continue Januvia 100 mg daily.  - Glucose, random - Hemoglobin A1c  2. Essential  hypertension BP is at goal. Continue Benicar 40-12.5 mg daily.   3. Hyperlipidemia, unspecified hyperlipidemia type Lipids have been at goal. Continue rosuvastatin 10 mg daily.  4. Class 2 severe obesity due to excess calories with serious comorbidity and body mass index (BMI) of 37.0 to 37.9 in adult ([place]) Weight down 2 lbs in last few weeks. I encouraged her to continue with her recommitment to her exercise plan.  Return in about 3 months (around 02/11/2022) for Reassessment.   Haydee Salter, MD

## 2021-11-13 ENCOUNTER — Encounter: Payer: Self-pay | Admitting: Family Medicine

## 2021-11-13 DIAGNOSIS — E119 Type 2 diabetes mellitus without complications: Secondary | ICD-10-CM

## 2021-11-13 MED ORDER — METFORMIN HCL 500 MG PO TABS: 500.0000 mg | ORAL_TABLET | Freq: Every day | ORAL | 3 refills | Status: DC

## 2021-11-13 NOTE — Telephone Encounter (Signed)
Caller Name: Aryka Coonradt Call back phone #: [phone]  Reason for Call: Pt received flu shot yesterday (9/12) and her arm is now swelling and warm to the touch. She also had questions about continuing with Metformin. Preferred pharmacy  [id number] [place]:  [phone] Fax:  [phone]
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
## 2021-11-15 ENCOUNTER — Encounter: Payer: Self-pay | Admitting: Physical Therapy

## 2021-11-15 ENCOUNTER — Ambulatory Visit: Payer: 59 | Admitting: Physical Therapy

## 2021-11-15 DIAGNOSIS — M542 Cervicalgia: Secondary | ICD-10-CM

## 2021-11-15 DIAGNOSIS — M6281 Muscle weakness (generalized): Secondary | ICD-10-CM

## 2021-11-15 DIAGNOSIS — R293 Abnormal posture: Secondary | ICD-10-CM
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
## 2021-11-22 ENCOUNTER — Ambulatory Visit: Payer: 59 | Admitting: Physical Therapy

## 2021-11-22 ENCOUNTER — Encounter: Payer: Self-pay | Admitting: Physical Therapy

## 2021-11-22 DIAGNOSIS — M542 Cervicalgia: Secondary | ICD-10-CM

## 2021-11-22 DIAGNOSIS — M6281 Muscle weakness (generalized): Secondary | ICD-10-CM

## 2021-11-22 DIAGNOSIS — R293 Abnormal posture: Secondary | ICD-10-CM

## 2021-11-23 ENCOUNTER — Ambulatory Visit: Payer: 59

## 2021-11-23 DIAGNOSIS — M542 Cervicalgia: Secondary | ICD-10-CM | POA: Diagnosis not present

## 2021-11-23 DIAGNOSIS — M6281 Muscle weakness (generalized): Secondary | ICD-10-CM

## 2021-11-23 DIAGNOSIS — R293 Abnormal posture: Secondary | ICD-10-CM
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
## 2021-11-29 ENCOUNTER — Ambulatory Visit: Payer: 59 | Admitting: Physical Therapy

## 2021-11-29 DIAGNOSIS — M542 Cervicalgia: Secondary | ICD-10-CM | POA: Diagnosis not present

## 2021-11-29 DIAGNOSIS — M6281 Muscle weakness (generalized): Secondary | ICD-10-CM

## 2021-11-29 DIAGNOSIS — R293 Abnormal posture: Secondary | ICD-10-CM

## 2021-11-30 ENCOUNTER — Ambulatory Visit: Payer: 59

## 2021-11-30 DIAGNOSIS — R293 Abnormal posture: Secondary | ICD-10-CM

## 2021-11-30 DIAGNOSIS — M542 Cervicalgia: Secondary | ICD-10-CM

## 2021-11-30 DIAGNOSIS — M6281 Muscle weakness (generalized): Secondary | ICD-10-CM

## 2021-11-30 NOTE — Patient Instructions (Signed)
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
## 2021-12-01 NOTE — Progress Notes (Unsigned)
Name: [name]  MRN/ DOB: [id number], 09-18-1965    Age/ Sex: 56 y.o., female     PCP: Isabel Salter, MD   Reason for Endocrinology Evaluation: Left thyroid nodule      Initial Endocrinology Clinic Visit: 05/21/2020    PATIENT IDENTIFIER: [name] is a 56 y.o., female with a past medical history of  T2DM and dyslipidemia. She has followed with [facility] since 05/21/2020 for consultative assistance with management of her left thyroid nodule .   HISTORICAL SUMMARY: She was diagnosed with left thyroid nodule on ultrasound 05/2020. She is S/P FNA of the left mid nodule 1.5 cm on 06/05/2020 with benign cytology, but scan cellularity , repeat ultrasound 05/2021 showed decrease in size from 1.5 to 1.1 cm   She was having anxiety and palpitations around 2020 when her mother passed.    NO FH of thyroid disease   SUBJECTIVE:    Today (12/01/2021):  [name] is here for a follow up on left thyroid nodule.   Denies local neck swelling, denies pain  Denies dysphagia  Anxiety has improved  Denies palpitations  Denies loose stools or diarrhea  Has intentional weight loss     She is S/P benign FNA 4/5th ,2022  HISTORY:  Past Medical History:  Past Medical History:  Diagnosis Date   Abnormal uterine bleeding (AUB)    Arthritis    Diabetes mellitus    Elevated cholesterol    Endometrial polyp    Endometrial polyp    Endometriosis    Hypertension    Migraines    Nodule    Ovarian cyst    Past Surgical History:  Past Surgical History:  Procedure Laterality Date   COMBINED HYSTEROSCOPY DIAGNOSTIC / D&C  yrs ago   [place]   with laser adhesions   DILITATION & CURRETTAGE/HYSTROSCOPY WITH NOVASURE ABLATION N/A 11/04/2019   Procedure: DILATATION & CURETTAGE/HYSTEROSCOPY WITH NOVASURE ABLATION;  Surgeon: Isabel Pierini, MD;  Location: [facility];  Service: Gynecology;  Laterality: N/A;    INTRAUTERINE DEVICE INSERTION     mirena-Inserted 05-16-14   KNEE SURGERY Left yrs ago   meniscurs tear repair   mirena removed  2021   OOPHORECTOMY  2008   left   ROTATOR CUFF REPAIR Right yrs ago   WRIST SURGERY Right    gang. cyst   Social History:  reports that she has never smoked. She has never used smokeless tobacco. She reports that she does not currently use alcohol. She reports that she does not use drugs. Family History:  Family History  Problem Relation Age of Onset   Hypertension Mother    Diabetes Mother    COPD Mother    Cancer Mother        Lung   Hypertension Father    Stroke Father 83   Diabetes Sister    Hypertension Brother    Diabetes Maternal Aunt    Cancer Maternal Aunt        Kidney   Kidney disease Maternal Aunt    Diabetes Maternal Aunt    Stroke Maternal Grandfather    Diabetes Maternal Grandfather    Colon cancer Neg Hx    Esophageal cancer Neg Hx    Rectal cancer Neg Hx    Stomach cancer Neg Hx      HOME MEDICATIONS: Allergies as of 12/02/2021       Reactions   Dilaudid [hydromorphone Hcl] Other (See Comments)  Broke in sweat and started shaking, can take oral   Sulfa Antibiotics Hives   Tylox [oxycodone-acetaminophen] Nausea And Vomiting   Clindamycin/lincomycin Rash   hives        Medication List        Accurate as of December 01, 2021  9:26 AM. If you have any questions, ask your nurse or doctor.          acetaminophen 500 MG tablet Commonly known as: TYLENOL Take 2 tablets (1,000 mg total) by mouth every 6 (six) hours as needed for mild pain.   cholecalciferol 25 MCG (1000 UNIT) tablet Commonly known as: VITAMIN D3 Take 1,000 Units by mouth daily.   clonazePAM 0.5 MG tablet Commonly known as: KLONOPIN TAKE 1 TABLET BY MOUTH 2 TIMES DAILY AS NEEDED FOR ANXIETY.   gabapentin 100 MG capsule Commonly known as: NEURONTIN Take 1 capsule (100 mg total) by mouth 3 (three) times daily as needed.   Januvia 100 MG  tablet Generic drug: sitaGLIPtin TAKE 1 TABLET BY MOUTH DAILY AFTER BREAKFAST.   metFORMIN 500 MG tablet Commonly known as: GLUCOPHAGE Take 1 tablet (500 mg total) by mouth daily with breakfast.   methocarbamol 500 MG tablet Commonly known as: ROBAXIN Take 1 tablet (500 mg total) by mouth 2 (two) times daily.   nabumetone 500 MG tablet Commonly known as: RELAFEN Take 1 tablet (500 mg total) by mouth 2 (two) times daily as needed.   olmesartan-hydrochlorothiazide 40-12.5 MG tablet Commonly known as: BENICAR HCT TAKE 1 TABLET BY MOUTH EVERY DAY   PARoxetine 20 MG tablet Commonly known as: PAXIL Take 1 tablet (20 mg total) by mouth daily.   rosuvastatin 10 MG tablet Commonly known as: CRESTOR Take 1 tablet (10 mg total) by mouth daily.   tiZANidine 4 MG tablet Commonly known as: Zanaflex Take 1 tablet (4 mg total) by mouth every 8 (eight) hours as needed for muscle spasms.   vitamin C 1000 MG tablet Take 1,000 mg by mouth daily.   zinc gluconate 50 MG tablet Take 50 mg by mouth daily.          OBJECTIVE:   PHYSICAL EXAM: VS: LMP  (LMP Unknown)    EXAM: General: Pt appears well and is in NAD  Neck: General: Supple without adenopathy. Thyroid: Thyroid size normal. Left isthmic nodule palpated.   Heart: RRR  Chest : CTA  Abdomen: Soft, non tender   Mental Status: Judgment, insight: Intact Memory: Intact for recent and remote events Mood and affect: No depression, anxiety, or agitation     DATA REVIEWED: Results for Isabel, Vasquez (MRN [id number]) as of 11/26/2020 11:57  Ref. Range 11/26/2020 08:03  TSH Latest Ref Range: 0.35 - 5.50 uIU/mL 1.61  Triiodothyronine,Free,Serum Latest Ref Range: 2.3 - 4.2 pg/mL 2.9  T4,Free(Direct) Latest Ref Range: 0.60 - 1.60 ng/dL 0.80     Thyroid Ultrasound 05/17/2021 Estimated total number of nodules >/= 1 cm: 1   Number of spongiform nodules >/=  2 cm not described below (TR1): 0   Number of mixed cystic and  solid nodules >/= 1.5 cm not described below ([place]): 0   _________________________________________________________   Nodule within the left thyroid, decreased in size from 1.5 cm to 1.1 cm, previously biopsied. Assuming benign result, no further specific follow-up would be indicated.   No adenopathy   Recommendations follow those established by the new ACR TI-RADS criteria (J Am Coll Radiol 2017;14:587-595).   IMPRESSION: Decreasing size of left-sided thyroid nodule, which has  had prior biopsy. Assuming benign result, no further specific follow-up would be indicated.   FNA 06/05/2020  FINAL MICROSCOPIC DIAGNOSIS:  - Consistent with benign follicular nodule (Bethesda category II)   ASSESSMENT / PLAN / RECOMMENDATIONS:   Left thyroid Nodule :  -No Local neck symptoms  - Pt is clinically and biochemically euthyroid  - S/P Benign FNA in 06/2020 - Will repeat thyroid ultrasound 05/2021  F/U in 1 yr   Signed electronically by: Mack Guise, MD  [facility]  [address] [place], [place] [address] Phone: [phone] FAX: [phone]      CC: [name], [place] [address] Phone: [phone]  Fax: [phone]   Return to Endocrinology clinic as below: Future Appointments  Date Time Provider [place]  12/02/2021  7:30 AM Avrohom Mckelvin, Melanie Crazier, MD [facility] None  12/06/2021  2:00 PM Marjo Bicker [facility]  12/07/2021  8:15 AM Cherie Ouch, PT [facility]  12/13/2021  2:00 PM Marjo Bicker [facility]  12/20/2021  2:00 PM Marjo Bicker [facility]  02/11/2022  8:00 AM Gena Fray Lillette Boxer, MD [facility]

## 2021-12-02 ENCOUNTER — Ambulatory Visit (INDEPENDENT_AMBULATORY_CARE_PROVIDER_SITE_OTHER): Payer: 59 | Admitting: Internal Medicine

## 2021-12-02 ENCOUNTER — Encounter: Payer: Self-pay | Admitting: Internal Medicine

## 2021-12-02 ENCOUNTER — Other Ambulatory Visit (INDEPENDENT_AMBULATORY_CARE_PROVIDER_SITE_OTHER): Payer: 59

## 2021-12-02 VITALS — BP 126/82 | HR 80 | Ht 59.0 in | Wt 184.0 lb

## 2021-12-02 DIAGNOSIS — E041 Nontoxic single thyroid nodule: Secondary | ICD-10-CM

## 2021-12-03 LAB — T4, FREE: Free T4: 0.91 ng/dL (ref 0.60–1.60)

## 2021-12-03 LAB — TSH: TSH: 2.7 u[IU]/mL (ref 0.35–5.50)

## 2021-12-06 ENCOUNTER — Ambulatory Visit: Payer: 59 | Attending: Family Medicine | Admitting: Physical Therapy

## 2021-12-06 ENCOUNTER — Encounter: Payer: Self-pay | Admitting: Physical Therapy

## 2021-12-06 DIAGNOSIS — M6281 Muscle weakness (generalized): Secondary | ICD-10-CM | POA: Insufficient documentation

## 2021-12-06 DIAGNOSIS — M542 Cervicalgia: Secondary | ICD-10-CM | POA: Insufficient documentation

## 2021-12-06 DIAGNOSIS — R293 Abnormal posture: Secondary | ICD-10-CM | POA: Diagnosis present
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
## 2021-12-07 ENCOUNTER — Ambulatory Visit: Payer: 59

## 2021-12-07 DIAGNOSIS — M542 Cervicalgia: Secondary | ICD-10-CM | POA: Diagnosis not present

## 2021-12-07 DIAGNOSIS — M6281 Muscle weakness (generalized): Secondary | ICD-10-CM

## 2021-12-07 DIAGNOSIS — R293 Abnormal posture: Secondary | ICD-10-CM
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
## 2021-12-13 ENCOUNTER — Encounter: Payer: Self-pay | Admitting: Physical Therapy

## 2021-12-13 ENCOUNTER — Ambulatory Visit: Payer: 59 | Admitting: Physical Therapy

## 2021-12-13 DIAGNOSIS — M542 Cervicalgia: Secondary | ICD-10-CM | POA: Diagnosis not present

## 2021-12-13 DIAGNOSIS — M6281 Muscle weakness (generalized): Secondary | ICD-10-CM

## 2021-12-13 DIAGNOSIS — R293 Abnormal posture: Secondary | ICD-10-CM
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
## 2021-12-18 ENCOUNTER — Telehealth: Payer: Self-pay | Admitting: Family Medicine

## 2021-12-18 ENCOUNTER — Ambulatory Visit: Payer: 59 | Admitting: Obstetrics & Gynecology

## 2021-12-18 NOTE — Telephone Encounter (Signed)
Pt called and said her physical therapy is about to be over and wanted to know did you want her to come in for a follow up appt

## 2021-12-20 ENCOUNTER — Ambulatory Visit: Payer: 59 | Admitting: Physical Therapy

## 2021-12-20 ENCOUNTER — Ambulatory Visit: Payer: 59

## 2021-12-20 DIAGNOSIS — M6281 Muscle weakness (generalized): Secondary | ICD-10-CM

## 2021-12-20 DIAGNOSIS — R293 Abnormal posture: Secondary | ICD-10-CM

## 2021-12-20 DIAGNOSIS — M542 Cervicalgia: Secondary | ICD-10-CM | POA: Diagnosis not present
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
## 2021-12-23 NOTE — Telephone Encounter (Signed)
Spoke with Pt and she agreed to keep her appointment on 02/11/22

## 2022-01-16 ENCOUNTER — Other Ambulatory Visit: Payer: Self-pay | Admitting: Family Medicine

## 2022-01-16 DIAGNOSIS — F418 Other specified anxiety disorders: Secondary | ICD-10-CM

## 2022-01-16 MED ORDER — CLONAZEPAM 0.5 MG PO TABS: 0.5000 mg | ORAL_TABLET | Freq: Two times a day (BID) | ORAL | 1 refills | Status: DC | PRN

## 2022-01-16 NOTE — Telephone Encounter (Signed)
Refill request for  Clonazepam 0.5 mg LR 03/14/21, #30, 1 rf LOV 11/12/21 FOV 02/11/22  Please review and advise.   Thanks.  Dm/cma

## 2022-01-16 NOTE — Telephone Encounter (Signed)
Caller Name: pt Call back phone #: [phone]   MEDICATION(S):  clonazePAM (KLONOPIN) 0.5 MG tablet [[id number]]   Days of Med Remaining: none Has the patient contacted their pharmacy (YES/NO)? Yes, contact your pharmacy   Pref[facility] Pharmacy: [id number] [id number]: [phone] Fax: [phone] #: [id number]

## 2022-01-17 ENCOUNTER — Ambulatory Visit: Payer: 59 | Attending: Family Medicine

## 2022-01-17 ENCOUNTER — Other Ambulatory Visit: Payer: Self-pay

## 2022-01-17 DIAGNOSIS — M7502 Adhesive capsulitis of left shoulder: Secondary | ICD-10-CM | POA: Insufficient documentation

## 2022-01-17 DIAGNOSIS — M25512 Pain in left shoulder: Secondary | ICD-10-CM | POA: Insufficient documentation

## 2022-01-17 NOTE — Telephone Encounter (Signed)
Patient notified VIA phone. Dm/cma  

## 2022-01-17 NOTE — Therapy (Signed)
OUTPATIENT PHYSICAL THERAPY SHOULDER EVALUATION   Patient Name: [name] MRN: [id number] DOB:1965-04-18, 56 y.o., female Today's Date: 01/17/2022  END OF SESSION:  PT End of Session - 01/17/22 1310     Visit Number 1    Number of Visits 12    Date for PT Re-Evaluation 03/14/22    Authorization Type [employer]    Progress Note Due on Visit 10    PT Start Time 1310    PT Stop Time 1345    PT Time Calculation (min) 35 min    Activity Tolerance Patient tolerated treatment well    Behavior During Therapy WFL for tasks assessed/performed             Past Medical History:  Diagnosis Date   Abnormal uterine bleeding (AUB)    Arthritis    Diabetes mellitus    Elevated cholesterol    Endometrial polyp    Endometrial polyp    Endometriosis    Hypertension    Migraines    Nodule    Ovarian cyst    Past Surgical History:  Procedure Laterality Date   COMBINED HYSTEROSCOPY DIAGNOSTIC / D&C  yrs ago   [place]   with laser adhesions   DILITATION & CURRETTAGE/HYSTROSCOPY WITH NOVASURE ABLATION N/A 11/04/2019   Procedure: DILATATION & CURETTAGE/HYSTEROSCOPY WITH NOVASURE ABLATION;  Surgeon: Joseph Pierini, MD;  Location: [facility];  Service: Gynecology;  Laterality: N/A;   INTRAUTERINE DEVICE INSERTION     mirena-Inserted 05-16-14   KNEE SURGERY Left yrs ago   meniscurs tear repair   mirena removed  2021   OOPHORECTOMY  2008   left   ROTATOR CUFF REPAIR Right yrs ago   WRIST SURGERY Right    gang. cyst   Patient Active Problem List   Diagnosis Date Noted   Depression with anxiety 09/27/2020   Migraine headache 09/27/2020   Insomnia 09/27/2020   Thyroid nodule 09/27/2020   Class 2 obesity due to excess calories with body mass index (BMI) of 37.0 to 37.9 in adult 03/09/2019   Acute right-sided low back pain with right-sided sciatica 06/10/2017   Trochanteric bursitis, right hip 05/27/2017   Atypical chest pain 11/18/2012    Type 2 diabetes mellitus ([place]) 11/18/2012   Essential hypertension 11/18/2012   Hyperlipidemia     PCP: Haydee Salter, MD   REFERRING PROVIDER: Jonelle Sidle D, PA   REFERRING DIAG: M75.02 (ICD-10-CM) - Adhesive bursitis of left shoulder   THERAPY DIAG: Adhesive bursitis of left shoulder   Rationale for Evaluation and Treatment: Rehabilitation  ONSET DATE: 2-3 months  SUBJECTIVE:  SUBJECTIVE STATEMENT: 2-3 month hx of L shoulder pain, injection 2 weeks ago provided some relief as well as an increase in AROM.  Has difficulty reaching OH and B/H back as well as sleep positions.  PERTINENT HISTORY:  Discussed with Carianna that her symptoms are very consistent with left adhesive capsulitis. We discussed treatment options which include physical therapy, injection, brisement. We discussed the typical pattern of adhesive capsulitis as does the long time to resolve. Discussed its effects motion. We discussed glucose control. Discussed increased risk of glucose elevation with corticosteroid physical therapy and home exercise program and will monitor she will follow with me in 2 months for reevaluation. Patient in agreement with this plan.  PAIN:  Are you having pain? Yes: NPRS scale: 10/10 Pain location: L shoulder Pain description: ache Aggravating factors: AROM Relieving factors: rest  PRECAUTIONS: None  WEIGHT BEARING RESTRICTIONS: No  FALLS:  Has patient fallen in last 6 months? No  LIVING ENVIRONMENT: Lives with: lives with their family  OCCUPATION: Office work  PLOF: Independent  PATIENT GOALS:To regain my shoulder mobility and sleep w/o pain  NEXT MD VISIT:   OBJECTIVE:   DIAGNOSTIC FINDINGS:  None available  PATIENT SURVEYS:  FOTO 54(64 predicted)  COGNITION: Overall  cognitive status: Within functional limits for tasks assessed     SENSATION: Not tested  POSTURE: Slightly rounded and depressed L shoulder  UPPER EXTREMITY ROM:   A/PROM Right eval Left eval  Shoulder flexion  95/125d  Shoulder extension    Shoulder abduction  70/90d  Shoulder adduction    Shoulder internal rotation Tip of scapula L SI joint/70d  Shoulder external rotation T3 C7/30d  Elbow flexion    Elbow extension    Wrist flexion    Wrist extension    Wrist ulnar deviation    Wrist radial deviation    Wrist pronation    Wrist supination    (Blank rows = not tested)  UPPER EXTREMITY MMT:  MMT Right eval Left eval  Shoulder flexion  3+  Shoulder extension  4+  Shoulder abduction  3+  Shoulder adduction    Shoulder internal rotation  4+  Shoulder external rotation  3+  Middle trapezius    Lower trapezius    Elbow flexion    Elbow extension    Wrist flexion    Wrist extension    Wrist ulnar deviation    Wrist radial deviation    Wrist pronation    Wrist supination    Grip strength (lbs)    (Blank rows = not tested)  SHOULDER SPECIAL TESTS: N/A  JOINT MOBILITY TESTING:  deferred  PALPATION:  TTP distal tip of acromion/AC join    TODAY'S TREATMENT:  DATE: 01/17/22 Eval and HEP   PATIENT EDUCATION: Education details: Discussed eval findings, rehab rationale and POC and patient is in agreement  Person educated: Patient Education method: Explanation Education comprehension: verbalized understanding and needs further education  HOME EXERCISE PROGRAM: Access Code: [email or web address] URL: [email or web address] Date: 01/17/2022 Prepared by: Sharlynn Oliphant  Exercises - Supine Alternating Shoulder Flexion  - 5 x daily - 5 x weekly - 1 sets - 10 reps - Standing Shoulder Scaption  - 5 x daily - 5 x weekly - 1  sets - 10 reps  ASSESSMENT:  CLINICAL IMPRESSION: Patient is a 56 y.o. female who was seen today for physical therapy evaluation and treatment for L shoulder pain.  Objective findings include decreased active and passive range of motion throughout the left shoulder, decreased strength due to pain with resisted testing as well as tenderness to palpation at the distal end of the acromion.  Patient unable to functionally reach overhead or behind the back due to pain levels.  Patient is a good candidate for outpatient physical therapy to regain shoulder range of motion and strength function as well as decrease pain to allow patient to return to ADL activities which would include reaching overhead behind the back and tolerating sleep positions  OBJECTIVE IMPAIRMENTS: decreased activity tolerance, decreased knowledge of condition, decreased mobility, decreased ROM, decreased strength, impaired UE functional use, and pain.   ACTIVITY LIMITATIONS: carrying, lifting, sleeping, bed mobility, and reach over head  PERSONAL FACTORS: Time since onset of injury/illness/exacerbation and 1 comorbidity: DM  are also affecting patient's functional outcome.   REHAB POTENTIAL: Good  CLINICAL DECISION MAKING: Stable/uncomplicated  EVALUATION COMPLEXITY: Low   GOALS: Goals reviewed with patient? No  SHORT TERM GOALS: Target date: 02/07/22  Patient to demonstrate independence in HEP  Baseline:[id number] Goal status: INITIAL  2.  Increase AROM to 120d flexion and abduction Baseline:  A/PROM Right eval Left eval  Shoulder flexion  95/125d  Shoulder extension    Shoulder abduction  70/90d  Shoulder adduction    Shoulder internal rotation Tip of scapula L SI joint/70d  Shoulder external rotation T3 C7/30d   Goal status: INITIAL  3.  Decrease pain at night to 6/10 Baseline: 8/10 Goal status: INITIAL    LONG TERM GOALS: Target date: 03/02/22  Increase AROM to 150d flexion and abduction Baseline:   A/PROM Right eval Left eval  Shoulder flexion  95/125d  Shoulder extension    Shoulder abduction  70/90d  Shoulder adduction    Shoulder internal rotation Tip of scapula L SI joint/70d  Shoulder external rotation T3 C7/30d   Goal status: INITIAL  2.  Increase L shoulder strength to 4/5 in deficit areas Baseline:  MMT Right eval Left eval  Shoulder flexion  3+  Shoulder extension  4+  Shoulder abduction  3+  Shoulder adduction    Shoulder internal rotation  4+  Shoulder external rotation  3+   Goal status: INITIAL  3.  Increase FOTO score to 64 Baseline: 54 Goal status: INITIAL  4.  Decrease worst pan to 4/10 Baseline: 8/10 Goal status: INITIAL    PLAN:  PT FREQUENCY: 2x/week  PT DURATION: 6 weeks  PLANNED INTERVENTIONS: Therapeutic exercises, Therapeutic activity, Neuromuscular re-education, Balance training, Gait training, Patient/Family education, Self Care, Joint mobilization, Joint manipulation, Dry Needling, Manual therapy, and Re-evaluation  PLAN FOR NEXT SESSION: HEP review and update, A/PROM, joint mobs, there-ex, functional training    Lanice Shirts, PT 01/17/2022, 1:12 PM

## 2022-01-28 NOTE — Therapy (Signed)
OUTPATIENT PHYSICAL THERAPY TREATMENT NOTE   Patient Name: [name] MRN: [id number] DOB:[birth year], 56 y.o., female Today's Date: 01/29/2022  PCP: Haydee Salter, MD    REFERRING PROVIDER: Faythe Casa, PA   END OF SESSION:   PT End of Session - 01/29/22 0846     Visit Number 2    Number of Visits 12    Date for PT Re-Evaluation 03/14/22    Authorization Type [employer]    Authorization - Visit Number 13    Authorization - Number of Visits 20   annual VL   Progress Note Due on Visit 10    PT Start Time 9371    PT Stop Time 6967    PT Time Calculation (min) 47 min    Activity Tolerance Patient tolerated treatment well;No increased pain    Behavior During Therapy WFL for tasks assessed/performed             Past Medical History:  Diagnosis Date   Abnormal uterine bleeding (AUB)    Arthritis    Diabetes mellitus    Elevated cholesterol    Endometrial polyp    Endometrial polyp    Endometriosis    Hypertension    Migraines    Nodule    Ovarian cyst    Past Surgical History:  Procedure Laterality Date   COMBINED HYSTEROSCOPY DIAGNOSTIC / D&C  yrs ago   [place]   with laser adhesions   DILITATION & CURRETTAGE/HYSTROSCOPY WITH NOVASURE ABLATION N/A 11/04/2019   Procedure: DILATATION & CURETTAGE/HYSTEROSCOPY WITH NOVASURE ABLATION;  Surgeon: Joseph Pierini, MD;  Location: [place];  Service: Gynecology;  Laterality: N/A;   INTRAUTERINE DEVICE INSERTION     mirena-Inserted 05-16-14   KNEE SURGERY Left yrs ago   meniscurs tear repair   mirena removed  2021   OOPHORECTOMY  2008   left   ROTATOR CUFF REPAIR Right yrs ago   WRIST SURGERY Right    gang. cyst   Patient Active Problem List   Diagnosis Date Noted   Depression with anxiety 09/27/2020   Migraine headache 09/27/2020   Insomnia 09/27/2020   Thyroid nodule 09/27/2020   Class 2 obesity due to excess calories with body mass index (BMI) of 37.0 to  37.9 in adult 03/09/2019   Acute right-sided low back pain with right-sided sciatica 06/10/2017   Trochanteric bursitis, right hip 05/27/2017   Atypical chest pain 11/18/2012   Type 2 diabetes mellitus ([place]) 11/18/2012   Essential hypertension 11/18/2012   Hyperlipidemia     REFERRING DIAG: M75.02 (ICD-10-CM) - Adhesive bursitis of left shoulder    THERAPY DIAG:  Adhesive capsulitis of left shoulder  Acute pain of left shoulder  Rationale for Evaluation and Treatment Rehabilitation  PERTINENT HISTORY: 2-3 month hx of L shoulder pain, injection 2 weeks ago provided some relief as well as an increase in AROM. Has difficulty reaching OH and B/H back as well as sleep positions.  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 No new updates.   PAIN:  Are you having pain? Yes: NPRS scale: 7/10 Pain location: L shoulder Pain description: ache Aggravating factors: AROM Relieving factors: rest   OBJECTIVE: (objective measures completed at initial evaluation unless otherwise dated)   DIAGNOSTIC FINDINGS:  None available   PATIENT SURVEYS:  FOTO 54(64 predicted)   COGNITION: Overall cognitive status: Within functional limits for tasks assessed                                  SENSATION: Not tested   POSTURE: Slightly rounded and depressed L shoulder   UPPER EXTREMITY ROM:    A/PROM Right eval Left eval  Shoulder flexion   95/125d  Shoulder extension      Shoulder abduction   70/90d  Shoulder adduction      Shoulder internal rotation Tip of scapula L SI joint/70d  Shoulder external rotation T3 C7/30d  Elbow flexion      Elbow extension      Wrist flexion      Wrist  extension      Wrist ulnar deviation      Wrist radial deviation      Wrist pronation      Wrist supination      (Blank rows = not tested)   UPPER EXTREMITY MMT:   MMT Right eval Left eval  Shoulder flexion   3+  Shoulder extension   4+  Shoulder abduction   3+  Shoulder adduction      Shoulder internal rotation   4+  Shoulder external rotation   3+  Middle trapezius      Lower trapezius      Elbow flexion      Elbow extension      Wrist flexion      Wrist extension      Wrist ulnar deviation      Wrist radial deviation      Wrist pronation      Wrist supination      Grip strength (lbs)      (Blank rows = not tested)   SHOULDER SPECIAL TESTS: N/A   JOINT MOBILITY TESTING:  deferred   PALPATION:  TTP distal tip of acromion/AC join              TODAY'S TREATMENT:          [facility] Adult PT Treatment:                                                DATE: 01/29/22 Therapeutic Exercise: Swiss ball shoulder flexion, standing at table, x15 cues for appropriate ROM Swiss ball scaption at table x10, cues for ROM and pacing  Seated ER AAROM x10 with cane, cues for form and reduced compensation L UT stretch 4x30sec cues for form and breath control Scapular rolls fwd/back x10, cues for control and appropriate ROM Shoulder shrugs 2x12 cues for breath control and emphasis on eccentric portion   Shoulder ER isometric x5 at wall with towel, cues for graded resistance and appropriate pain levels   Self Care:  Activity modification, pacing of activities, symptom monitoring/tolerance and modification strategies  PATIENT EDUCATION: Education details: rationale for interventions, relevant anatomy/physiology Person educated: Patient Education method: Explanation Education comprehension: verbalized understanding and needs further education   HOME EXERCISE PROGRAM: Access Code:  [email or web address] URL: [email or web address] Date: 01/17/2022 Prepared by: Sharlynn Oliphant   Exercises - Supine Alternating Shoulder Flexion  - 5 x daily - 5 x weekly - 1 sets - 10 reps - Standing Shoulder Scaption  - 5 x daily - 5 x weekly - 1 sets - 10 reps   ASSESSMENT:   CLINICAL IMPRESSION: Pt arrives w/ 7/10 pain, reports good compliance with HEP and denies any significant symptoms. Pt is limited today by symptom irritability but overall tolerates session well with additional rest breaks, cues for appropriate ROM and breath control. Does demonstrate limitations in capsular pattern but demonstrates improved ROM with repetition of mobility exercises. At end of session, emphasis on breath control and muscular relaxation techniques for periscapular musculature given noted tendency for excessive UT elevation with increased pain. Pt inquisitive re: complaint of L pec pain that has been ongoing since onset of shoulder pain - discussed at length, reports having had unremarkable cardiac work up and pt description is mechanical in nature, education provided on monitoring for red flag symptoms, communication w/ provider as needed, and expected course with PT intervention. Pt reports mildly improved pain at end of session, no adverse events. Pt departs today's session in no acute distress, all voiced questions/concerns addressed appropriately from PT perspective.     OBJECTIVE IMPAIRMENTS: decreased activity tolerance, decreased knowledge of condition, decreased mobility, decreased ROM, decreased strength, impaired UE functional use, and pain.    ACTIVITY LIMITATIONS: carrying, lifting, sleeping, bed mobility, and reach over head   PERSONAL FACTORS: Time since onset of injury/illness/exacerbation and 1 comorbidity: DM  are also affecting patient's functional outcome.    REHAB POTENTIAL: Good   CLINICAL DECISION MAKING: Stable/uncomplicated   EVALUATION COMPLEXITY: Low     GOALS: Goals  reviewed with patient? No   SHORT TERM GOALS: Target date: 02/07/22   Patient to demonstrate independence in HEP  Baseline:[id number] Goal status: INITIAL   2.  Increase AROM to 120d flexion and abduction Baseline:  A/PROM Right eval Left eval  Shoulder flexion   95/125d  Shoulder extension      Shoulder abduction   70/90d  Shoulder adduction      Shoulder internal rotation Tip of scapula L SI joint/70d  Shoulder external rotation T3 C7/30d    Goal status: INITIAL   3.  Decrease pain at night to 6/10 Baseline: 8/10 Goal status: INITIAL       LONG TERM GOALS: Target date: 03/02/22   Increase AROM to 150d flexion and abduction Baseline:  A/PROM Right eval Left eval  Shoulder flexion   95/125d  Shoulder extension      Shoulder abduction   70/90d  Shoulder adduction      Shoulder internal rotation Tip of scapula L SI joint/70d  Shoulder external rotation T3 C7/30d    Goal status: INITIAL   2.  Increase L shoulder strength to 4/5 in deficit areas Baseline:  MMT Right eval Left eval  Shoulder flexion   3+  Shoulder extension   4+  Shoulder abduction   3+  Shoulder adduction      Shoulder internal rotation   4+  Shoulder external rotation   3+    Goal status: INITIAL   3.  Increase FOTO score to 64 Baseline: 54 Goal status: INITIAL   4.  Decrease worst pain  to 4/10 Baseline: 8/10 Goal status: INITIAL       PLAN:   PT FREQUENCY: 2x/week   PT DURATION: 6 weeks   PLANNED INTERVENTIONS: Therapeutic exercises, Therapeutic activity, Neuromuscular re-education, Balance training, Gait training, Patient/Family education, Self Care, Joint mobilization, Joint manipulation, Dry Needling, Manual therapy, and Re-evaluation   PLAN FOR NEXT SESSION: HEP review and update, A/PROM, joint mobs, there-ex, functional training    Leeroy Cha PT, DPT 01/29/2022 12:36 PM

## 2022-01-29 ENCOUNTER — Encounter: Payer: Self-pay | Admitting: Physical Therapy

## 2022-01-29 ENCOUNTER — Ambulatory Visit: Payer: 59 | Admitting: Physical Therapy

## 2022-01-29 DIAGNOSIS — M7502 Adhesive capsulitis of left shoulder: Secondary | ICD-10-CM | POA: Diagnosis not present

## 2022-01-29 DIAGNOSIS — M25512 Pain in left shoulder: Secondary | ICD-10-CM

## 2022-01-31 DIAGNOSIS — M7502 Adhesive capsulitis of left shoulder: Secondary | ICD-10-CM | POA: Insufficient documentation

## 2022-02-06 NOTE — Therapy (Signed)
OUTPATIENT PHYSICAL THERAPY TREATMENT NOTE   Patient Name: [name] MRN: [id number] DOB:1965/04/25, 56 y.o., female Today's Date: 02/07/2022  PCP: Haydee Salter, MD    REFERRING PROVIDER: Faythe Casa, PA   END OF SESSION:   PT End of Session - 02/07/22 1230     Visit Number 3    Number of Visits 12    Date for PT Re-Evaluation 03/14/22    Authorization Type [employer]    Authorization - Visit Number 14    Authorization - Number of Visits 20   annual VL   Progress Note Due on Visit 10    PT Start Time 1233    PT Stop Time 1314    PT Time Calculation (min) 41 min    Activity Tolerance Patient tolerated treatment well;No increased pain;Patient limited by pain    Behavior During Therapy [facility] for tasks assessed/performed              Past Medical History:  Diagnosis Date   Abnormal uterine bleeding (AUB)    Arthritis    Diabetes mellitus    Elevated cholesterol    Endometrial polyp    Endometrial polyp    Endometriosis    Hypertension    Migraines    Nodule    Ovarian cyst    Past Surgical History:  Procedure Laterality Date   COMBINED HYSTEROSCOPY DIAGNOSTIC / D&C  yrs ago   [place]   with laser adhesions   DILITATION & CURRETTAGE/HYSTROSCOPY WITH NOVASURE ABLATION N/A 11/04/2019   Procedure: DILATATION & CURETTAGE/HYSTEROSCOPY WITH NOVASURE ABLATION;  Surgeon: Joseph Pierini, MD;  Location: [place];  Service: Gynecology;  Laterality: N/A;   INTRAUTERINE DEVICE INSERTION     mirena-Inserted 05-16-14   KNEE SURGERY Left yrs ago   meniscurs tear repair   mirena removed  2021   OOPHORECTOMY  2008   left   ROTATOR CUFF REPAIR Right yrs ago   WRIST SURGERY Right    gang. cyst   Patient Active Problem List   Diagnosis Date Noted   Depression with anxiety 09/27/2020   Migraine headache 09/27/2020   Insomnia 09/27/2020   Thyroid nodule 09/27/2020   Class 2 obesity due to excess calories with body mass  index (BMI) of 37.0 to 37.9 in adult 03/09/2019   Acute right-sided low back pain with right-sided sciatica 06/10/2017   Trochanteric bursitis, right hip 05/27/2017   Atypical chest pain 11/18/2012   Type 2 diabetes mellitus ([place]) 11/18/2012   Essential hypertension 11/18/2012   Hyperlipidemia     REFERRING DIAG: M75.02 (ICD-10-CM) - Adhesive bursitis of left shoulder    THERAPY DIAG:  Adhesive capsulitis of left shoulder  Acute pain of left shoulder  Rationale for Evaluation and Treatment Rehabilitation  PERTINENT HISTORY: 2-3 month hx of L shoulder pain, injection 2 weeks ago provided some relief as well as an increase in AROM. Has difficulty reaching OH and B/H back as well as sleep positions.  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 Unsure of provocative factor, denies any issues after last session or Thursday. Reports good compliance with HEP and feels they are helpful, symptoms improved over weekend. Reports other flareups over the course of the week, unable to discern contributing factors  PAIN:  Are you having pain? Yes: NPRS scale: 5/10 Pain location: L shoulder Pain description: ache Aggravating factors: AROM Relieving factors: rest   OBJECTIVE: (objective measures completed at initial evaluation unless otherwise dated)   DIAGNOSTIC FINDINGS:  None available   PATIENT SURVEYS:  FOTO 54(64 predicted)   COGNITION: Overall cognitive status: Within functional limits for tasks assessed                                  SENSATION: Not tested   POSTURE: Slightly rounded and depressed L shoulder   UPPER EXTREMITY ROM:     A/PROM Right eval Left eval Left AROM 02/07/22  Shoulder flexion   95/125d 112 p!  Shoulder extension       Shoulder abduction   70/90d   Shoulder adduction       Shoulder internal rotation Tip of scapula L SI joint/70d   Shoulder external rotation T3 C7/30d   Elbow flexion       Elbow extension       Wrist flexion       Wrist extension       Wrist ulnar deviation       Wrist radial deviation       Wrist pronation       Wrist supination       (Blank rows = not tested)   UPPER EXTREMITY MMT:   MMT Right eval Left eval  Shoulder flexion   3+  Shoulder extension   4+  Shoulder abduction   3+  Shoulder adduction      Shoulder internal rotation   4+  Shoulder external rotation   3+  Middle trapezius      Lower trapezius      Elbow flexion      Elbow extension      Wrist flexion      Wrist extension      Wrist ulnar deviation      Wrist radial deviation      Wrist pronation      Wrist supination      Grip strength (lbs)      (Blank rows = not tested)   SHOULDER SPECIAL TESTS: N/A   JOINT MOBILITY TESTING:  deferred   PALPATION:  TTP distal tip of acromion/AC join              TODAY'S TREATMENT:         [facility] Adult PT Treatment:                                                DATE: 02/07/22 Therapeutic Exercise: Swiss ball flexion at table 2x10 cues for appropriate ROM and breath control Scap retraction x15 cues for form and pacing Swiss ball scaption at table x10  Manual Therapy: Seated STM deltoid, levator scap, upper trap. Biceps. Trigger point release biceps  [facility] Adult PT Treatment:  DATE: 01/29/22 Therapeutic Exercise: Swiss ball shoulder flexion, standing at table, x15 cues for appropriate ROM Swiss ball scaption at table x10, cues for ROM and pacing  Seated ER AAROM x10 with cane, cues for form and reduced compensation L UT stretch 4x30sec cues for form and breath control Scapular rolls fwd/back x10, cues  for control and appropriate ROM Shoulder shrugs 2x12 cues for breath control and emphasis on eccentric portion   Shoulder ER isometric x5 at wall with towel, cues for graded resistance and appropriate pain levels   Self Care:  Activity modification, pacing of activities, symptom monitoring/tolerance and modification strategies                                                                                                           PATIENT EDUCATION: Education details: rationale for interventions, relevant anatomy/physiology Person educated: Patient Education method: Explanation Education comprehension: verbalized understanding and needs further education   HOME EXERCISE PROGRAM: Access Code: [email or web address] URL: [email or web address] Date: 01/17/2022 Prepared by: Sharlynn Oliphant   Exercises - Supine Alternating Shoulder Flexion  - 5 x daily - 5 x weekly - 1 sets - 10 reps - Standing Shoulder Scaption  - 5 x daily - 5 x weekly - 1 sets - 10 reps   ASSESSMENT:   CLINICAL IMPRESSION: Pt arrives w/ 5/10 pain on NPS, reports good compliance with HEP. Notes a few flareups over past week but denies any issues immediately after last session, unable to discern contributing factors to flareups. Today pt continues to demonstrate limitations in activity tolerance due to symptom irritability, frequent cues for appropriate ROM, pacing, and breath control. No increase in resting pain reported w/ activity although intermittent throbbing is reported w/ stretching, improves w/ rest. Despite symptom irritability noted improvement in shoulder flexion AROM as above. Addition of STM after activity with significant tightness/tenderness throughout GH/periscapular musculature. Distal UE symptoms reproducible w/ palpation of trigger points in biceps/anterior deltoid, improves w/ trigger point release. Departs with 5/10 pain on NPS, report of muscular fatigue.    OBJECTIVE IMPAIRMENTS: decreased activity  tolerance, decreased knowledge of condition, decreased mobility, decreased ROM, decreased strength, impaired UE functional use, and pain.    ACTIVITY LIMITATIONS: carrying, lifting, sleeping, bed mobility, and reach over head   PERSONAL FACTORS: Time since onset of injury/illness/exacerbation and 1 comorbidity: DM  are also affecting patient's functional outcome.    REHAB POTENTIAL: Good   CLINICAL DECISION MAKING: Stable/uncomplicated   EVALUATION COMPLEXITY: Low     GOALS: Goals reviewed with patient? No   SHORT TERM GOALS: Target date: 02/07/22   Patient to demonstrate independence in HEP  Baseline:[id number] Goal status: INITIAL   2.  Increase AROM to 120d flexion and abduction Baseline:  A/PROM Right eval Left eval  Shoulder flexion   95/125d  Shoulder extension      Shoulder abduction   70/90d  Shoulder adduction      Shoulder internal rotation Tip of scapula L SI joint/70d  Shoulder external rotation T3 C7/30d    Goal status: INITIAL   3.  Decrease pain at night to 6/10 Baseline: 8/10 Goal status: INITIAL       LONG TERM GOALS: Target date: 03/02/22   Increase AROM to 150d flexion and abduction Baseline:  A/PROM Right eval Left eval  Shoulder flexion   95/125d  Shoulder extension      Shoulder abduction   70/90d  Shoulder adduction      Shoulder internal rotation Tip of scapula L SI joint/70d  Shoulder external rotation T3 C7/30d    Goal status: INITIAL   2.  Increase L shoulder strength to 4/5 in deficit areas Baseline:  MMT Right eval Left eval  Shoulder flexion   3+  Shoulder extension   4+  Shoulder abduction   3+  Shoulder adduction      Shoulder internal rotation   4+  Shoulder external rotation   3+    Goal status: INITIAL   3.  Increase FOTO score to 64 Baseline: 54 Goal status: INITIAL   4.  Decrease worst pain to 4/10 Baseline: 8/10 Goal status: INITIAL       PLAN:   PT FREQUENCY: 2x/week   PT DURATION: 6 weeks    PLANNED INTERVENTIONS: Therapeutic exercises, Therapeutic activity, Neuromuscular re-education, Balance training, Gait training, Patient/Family education, Self Care, Joint mobilization, Joint manipulation, Dry Needling, Manual therapy, and Re-evaluation   PLAN FOR NEXT SESSION:  HEP review and update. Manual as indicated. Increased emphasis on isometrics and muscle activation next session   Leeroy Cha PT, DPT 02/07/2022 2:24 PM

## 2022-02-07 ENCOUNTER — Encounter: Payer: Self-pay | Admitting: Physical Therapy

## 2022-02-07 ENCOUNTER — Ambulatory Visit: Payer: 59 | Attending: Family Medicine | Admitting: Physical Therapy

## 2022-02-07 DIAGNOSIS — M7502 Adhesive capsulitis of left shoulder: Secondary | ICD-10-CM | POA: Insufficient documentation

## 2022-02-07 DIAGNOSIS — M25512 Pain in left shoulder: Secondary | ICD-10-CM | POA: Diagnosis present

## 2022-02-10 ENCOUNTER — Ambulatory Visit: Payer: 59 | Admitting: Physical Therapy

## 2022-02-10 NOTE — Therapy (Incomplete)
OUTPATIENT PHYSICAL THERAPY TREATMENT NOTE   Patient Name: [name] MRN: [id number] DOB:Dec 01, 1965, 56 y.o., female Today's Date: 02/10/2022  PCP: Haydee Salter, MD    REFERRING PROVIDER: Jonelle Sidle D, PA   END OF SESSION:      Past Medical History:  Diagnosis Date   Abnormal uterine bleeding (AUB)    Arthritis    Diabetes mellitus    Elevated cholesterol    Endometrial polyp    Endometrial polyp    Endometriosis    Hypertension    Migraines    Nodule    Ovarian cyst    Past Surgical History:  Procedure Laterality Date   COMBINED HYSTEROSCOPY DIAGNOSTIC / D&C  yrs ago   [place]   with laser adhesions   DILITATION & CURRETTAGE/HYSTROSCOPY WITH NOVASURE ABLATION N/A 11/04/2019   Procedure: DILATATION & CURETTAGE/HYSTEROSCOPY WITH NOVASURE ABLATION;  Surgeon: Joseph Pierini, MD;  Location: [place];  Service: Gynecology;  Laterality: N/A;   INTRAUTERINE DEVICE INSERTION     mirena-Inserted 05-16-14   KNEE SURGERY Left yrs ago   meniscurs tear repair   mirena removed  2021   OOPHORECTOMY  2008   left   ROTATOR CUFF REPAIR Right yrs ago   WRIST SURGERY Right    gang. cyst   Patient Active Problem List   Diagnosis Date Noted   Depression with anxiety 09/27/2020   Migraine headache 09/27/2020   Insomnia 09/27/2020   Thyroid nodule 09/27/2020   Class 2 obesity due to excess calories with body mass index (BMI) of 37.0 to 37.9 in adult 03/09/2019   Acute right-sided low back pain with right-sided sciatica 06/10/2017   Trochanteric bursitis, right hip 05/27/2017   Atypical chest pain 11/18/2012   Type 2 diabetes mellitus ([place]) 11/18/2012   Essential hypertension 11/18/2012   Hyperlipidemia     REFERRING DIAG: M75.02 (ICD-10-CM) - Adhesive bursitis of left shoulder    THERAPY DIAG:  No diagnosis found.  Rationale for Evaluation and Treatment Rehabilitation  PERTINENT HISTORY: 2-3 month hx of L  shoulder pain, injection 2 weeks ago provided some relief as well as an increase in AROM. Has difficulty reaching OH and B/H back as well as sleep positions.  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 Unsure of provocative factor, denies any issues after last session or Thursday. Reports good compliance with HEP and feels they are helpful, symptoms improved over weekend. Reports other flareups over the course of the week, unable to discern contributing factors  PAIN:  Are you having pain? Yes: NPRS scale: *** 5/10 Pain location: L shoulder Pain description: ache Aggravating factors: AROM Relieving factors: rest   OBJECTIVE: (objective measures completed at initial evaluation unless otherwise dated)   DIAGNOSTIC FINDINGS:  None available   PATIENT SURVEYS:  FOTO 54(64 predicted)   COGNITION: Overall cognitive status: Within functional limits for tasks assessed                                  SENSATION: Not tested   POSTURE: Slightly rounded and depressed L shoulder   UPPER EXTREMITY ROM:    A/PROM Right eval Left eval Left AROM 02/07/22  Shoulder flexion   95/125d 112 p!  Shoulder extension       Shoulder abduction   70/90d   Shoulder adduction       Shoulder internal rotation Tip of scapula L SI joint/70d   Shoulder external rotation T3 C7/30d   Elbow flexion       Elbow extension       Wrist flexion       Wrist extension       Wrist ulnar deviation       Wrist radial deviation       Wrist pronation       Wrist supination       (Blank rows = not tested)   UPPER EXTREMITY MMT:   MMT Right eval Left eval   Shoulder flexion   3+  Shoulder extension   4+  Shoulder abduction   3+  Shoulder adduction      Shoulder internal rotation   4+  Shoulder external rotation   3+  Middle trapezius      Lower trapezius      Elbow flexion      Elbow extension      Wrist flexion      Wrist extension      Wrist ulnar deviation      Wrist radial deviation      Wrist pronation      Wrist supination      Grip strength (lbs)      (Blank rows = not tested)   SHOULDER SPECIAL TESTS: N/A   JOINT MOBILITY TESTING:  deferred   PALPATION:  TTP distal tip of acromion/AC join              TODAY'S TREATMENT:         [facility] Adult PT Treatment:                                                DATE: 02/10/22 Therapeutic Exercise: *** Manual Therapy: *** Neuromuscular re-ed: *** Therapeutic Activity: *** Modalities: *** Self Care: [facility] Adult PT Treatment:                                                DATE: 02/07/22  Therapeutic Exercise: Swiss ball flexion at table 2x10 cues for appropriate ROM and breath control Scap retraction x15 cues for form and pacing Swiss ball scaption at table x10  Manual Therapy: Seated STM deltoid, levator scap, upper trap. Biceps. Trigger point release biceps  [facility] Adult PT Treatment:                                                DATE: 01/29/22 Therapeutic Exercise: Swiss ball shoulder flexion, standing at table, x15 cues for appropriate ROM Swiss ball scaption at table x10, cues for ROM and pacing  Seated ER AAROM x10 with cane, cues for form and reduced compensation L UT stretch 4x30sec cues for form and breath control Scapular rolls fwd/back x10, cues for control and appropriate ROM Shoulder shrugs 2x12 cues for breath control and emphasis on eccentric portion   Shoulder ER isometric x5 at wall with towel, cues for graded resistance and appropriate pain levels   Self Care:  Activity modification, pacing of activities, symptom monitoring/tolerance and  modification strategies                                                                                                           PATIENT EDUCATION: Education details: rationale for interventions, relevant anatomy/physiology Person educated: Patient Education method: Explanation Education comprehension: verbalized understanding and needs further education   HOME EXERCISE PROGRAM: Access Code: [email or web address] URL: [email or web address] Date: 01/17/2022 Prepared by: Sharlynn Oliphant   Exercises - Supine Alternating Shoulder Flexion  - 5 x daily - 5 x weekly - 1 sets - 10 reps - Standing Shoulder Scaption  - 5 x daily - 5 x weekly - 1 sets - 10 reps   ASSESSMENT:   CLINICAL IMPRESSION: ***  *** Pt arrives w/ 5/10 pain on NPS, reports good compliance with HEP. Notes a few flareups over past week but denies any issues immediately after last session, unable to discern contributing factors to flareups. Today pt continues to demonstrate limitations in activity tolerance due to symptom irritability, frequent cues for appropriate ROM, pacing, and breath control. No increase in resting pain reported w/ activity although intermittent throbbing is reported w/ stretching, improves w/ rest. Despite symptom irritability noted improvement in shoulder flexion AROM as above. Addition of STM after activity with significant tightness/tenderness throughout GH/periscapular musculature. Distal UE symptoms reproducible w/ palpation of trigger points in biceps/anterior deltoid, improves w/ trigger point release. Departs with 5/10 pain on NPS, report of muscular fatigue.    OBJECTIVE IMPAIRMENTS: decreased activity tolerance, decreased knowledge of condition, decreased mobility, decreased ROM, decreased strength, impaired UE functional use, and pain.    ACTIVITY LIMITATIONS: carrying, lifting, sleeping, bed mobility, and reach over head   PERSONAL FACTORS: Time since onset of injury/illness/exacerbation and  1 comorbidity: DM  are also affecting patient's functional outcome.    REHAB POTENTIAL: Good   CLINICAL DECISION MAKING: Stable/uncomplicated   EVALUATION COMPLEXITY: Low  GOALS: Goals reviewed with patient? No   SHORT TERM GOALS: Target date: 02/07/22   Patient to demonstrate independence in HEP  Baseline:[id number] Goal status: INITIAL   2.  Increase AROM to 120d flexion and abduction Baseline:  A/PROM Right eval Left eval  Shoulder flexion   95/125d  Shoulder extension      Shoulder abduction   70/90d  Shoulder adduction      Shoulder internal rotation Tip of scapula L SI joint/70d  Shoulder external rotation T3 C7/30d    Goal status: INITIAL   3.  Decrease pain at night to 6/10 Baseline: 8/10 Goal status: INITIAL       LONG TERM GOALS: Target date: 03/02/22   Increase AROM to 150d flexion and abduction Baseline:  A/PROM Right eval Left eval  Shoulder flexion   95/125d  Shoulder extension      Shoulder abduction   70/90d  Shoulder adduction      Shoulder internal rotation Tip of scapula L SI joint/70d  Shoulder external rotation T3 C7/30d    Goal status: INITIAL   2.  Increase L shoulder strength to 4/5 in deficit areas Baseline:  MMT Right eval Left eval  Shoulder flexion   3+  Shoulder extension   4+  Shoulder abduction   3+  Shoulder adduction      Shoulder internal rotation   4+  Shoulder external rotation   3+    Goal status: INITIAL   3.  Increase FOTO score to 64 Baseline: 54 Goal status: INITIAL   4.  Decrease worst pain to 4/10 Baseline: 8/10 Goal status: INITIAL       PLAN:   PT FREQUENCY: 2x/week   PT DURATION: 6 weeks   PLANNED INTERVENTIONS: Therapeutic exercises, Therapeutic activity, Neuromuscular re-education, Balance training, Gait training, Patient/Family education, Self Care, Joint mobilization, Joint manipulation, Dry Needling, Manual therapy, and Re-evaluation   PLAN FOR NEXT SESSION:  *** HEP review and  update. Manual as indicated. Increased emphasis on isometrics and muscle activation next session   Leeroy Cha PT, DPT 02/10/2022 8:29 AM

## 2022-02-11 ENCOUNTER — Encounter: Payer: Self-pay | Admitting: Family Medicine

## 2022-02-11 ENCOUNTER — Ambulatory Visit: Payer: 59 | Admitting: Family Medicine

## 2022-02-11 VITALS — BP 126/80 | HR 84 | Temp 97.2°F | Ht 59.0 in | Wt 185.0 lb

## 2022-02-11 DIAGNOSIS — I10 Essential (primary) hypertension: Secondary | ICD-10-CM

## 2022-02-11 DIAGNOSIS — E785 Hyperlipidemia, unspecified: Secondary | ICD-10-CM

## 2022-02-11 DIAGNOSIS — M7502 Adhesive capsulitis of left shoulder: Secondary | ICD-10-CM | POA: Diagnosis not present

## 2022-02-11 DIAGNOSIS — E119 Type 2 diabetes mellitus without complications: Secondary | ICD-10-CM | POA: Diagnosis not present

## 2022-02-11 DIAGNOSIS — F418 Other specified anxiety disorders: Secondary | ICD-10-CM | POA: Diagnosis not present

## 2022-02-11 LAB — LIPID PANEL
Cholesterol: 155 mg/dL (ref 0–200)
HDL: 75.1 mg/dL (ref >39.00)
LDL Cholesterol: 70 mg/dL (ref 0–99)
NonHDL: 80.11
Total CHOL/HDL Ratio: 2
Triglycerides: 51 mg/dL (ref 0.0–149.0)
VLDL: 10.2 mg/dL (ref 0.0–40.0)

## 2022-02-11 LAB — GLUCOSE, RANDOM: Glucose, Bld: 100 mg/dL — ABNORMAL HIGH (ref 70–99)

## 2022-02-11 LAB — HEMOGLOBIN A1C: Hgb A1c MFr Bld: 7 % — ABNORMAL HIGH (ref 4.6–6.5)

## 2022-02-11 NOTE — Progress Notes (Signed)
[facility] [address] [place] [address] Dept: [phone] Dept Fax: [phone]  Chronic Care Office Visit  Subjective:    Patient ID: Berkleigh Beckles, female    DOB: September 10, 1965, 56 y.o..   MRN: [id number]  Chief Complaint  Patient presents with   Follow-up    3 month f/u.  Fasting today.  C/o having pain in the upper LT chest/frozen shoulder    History of Present Illness:  Patient is in today for reassessment of chronic medical issues.  Ms. Davenport has a history of hypertension, managed on Benicar (olmesartan/HCTZ) 40-12.5 mg daily.   Ms. Spraker has a history of Type 2 diabetes. She is managed on sitagliptin (Januvia) 100 mg daily and metformin 500 mg daily. We added the metformin back to her regimen at her last visit, as her A1c had crept up above 7%.   Ms. Tome has a history of hyperlipidemia and is managed on rosuvastatin 10 mg daily.  Ms. Lafontant has a history of anxiety/depression. She had a lfare of this earlier int he Fall, brought on by grief associated with death int he family. However, she feels she is doing better at this point. She is managed on paroxetine 20 mg daily and clonazepam 0.5 mg PRN  Ms. Tomer has a history of adhesive capsulitis of the left shoulder. She is working with orthopedics and going through PT. She notes some left upper chest pain and tenderness that predated learning of her frozen shoulder.  Past Medical History: Patient Active Problem List   Diagnosis Date Noted   Adhesive capsulitis of left shoulder 01/31/2022   Depression with anxiety 09/27/2020   Migraine headache 09/27/2020   Insomnia 09/27/2020   Thyroid nodule 09/27/2020   Class 2 obesity due to excess calories with body mass index (BMI) of 37.0 to 37.9 in adult 03/09/2019   Acute right-sided low back pain with right-sided sciatica 06/10/2017   Trochanteric bursitis, right hip 05/27/2017   Atypical  chest pain 11/18/2012   Type 2 diabetes mellitus ([place]) 11/18/2012   Essential hypertension 11/18/2012   Hyperlipidemia    Past Surgical History:  Procedure Laterality Date   COMBINED HYSTEROSCOPY DIAGNOSTIC / D&C  yrs ago   [place]   with laser adhesions   DILITATION & CURRETTAGE/HYSTROSCOPY WITH NOVASURE ABLATION N/A 11/04/2019   Procedure: DILATATION & CURETTAGE/HYSTEROSCOPY WITH NOVASURE ABLATION;  Surgeon: Joseph Pierini, MD;  Location: [place];  Service: Gynecology;  Laterality: N/A;   INTRAUTERINE DEVICE INSERTION     mirena-Inserted 05-16-14   KNEE SURGERY Left yrs ago   meniscurs tear repair   mirena removed  2021   OOPHORECTOMY  2008   left   ROTATOR CUFF REPAIR Right yrs ago   WRIST SURGERY Right    gang. cyst   Family History  Problem Relation Age of Onset   Hypertension Mother    Diabetes Mother    COPD Mother    Cancer Mother        Lung   Hypertension Father    Stroke Father 37   Diabetes Sister    Hypertension Brother    Diabetes Maternal Aunt    Cancer Maternal Aunt        Kidney   Kidney disease Maternal Aunt    Diabetes Maternal Aunt    Stroke Maternal Grandfather    Diabetes Maternal Grandfather    Colon cancer Neg Hx    Esophageal cancer Neg Hx  Rectal cancer Neg Hx    Stomach cancer Neg Hx    Outpatient Medications Prior to Visit  Medication Sig Dispense Refill   Ascorbic Acid (VITAMIN C) 1000 MG tablet Take 1,000 mg by mouth daily.     cholecalciferol (VITAMIN D3) 25 MCG (1000 UNIT) tablet Take 1,000 Units by mouth daily.     clonazePAM (KLONOPIN) 0.5 MG tablet Take 1 tablet (0.5 mg total) by mouth 2 (two) times daily as needed for anxiety. 30 tablet 1   ibuprofen (ADVIL) 800 MG tablet TAKE 1 TABLET 3 TIMES A DAY BY ORAL ROUTE.     JANUVIA 100 MG tablet TAKE 1 TABLET BY MOUTH DAILY AFTER BREAKFAST. 90 tablet 3   metFORMIN (GLUCOPHAGE) 500 MG tablet Take 1 tablet (500 mg total) by mouth daily with  breakfast. 90 tablet 3   olmesartan-hydrochlorothiazide (BENICAR HCT) 40-12.5 MG tablet TAKE 1 TABLET BY MOUTH EVERY DAY 90 tablet 3   rosuvastatin (CRESTOR) 10 MG tablet Take 1 tablet (10 mg total) by mouth daily. 90 tablet 3   zinc gluconate 50 MG tablet Take 50 mg by mouth daily.     PARoxetine (PAXIL) 20 MG tablet Take 1 tablet (20 mg total) by mouth daily. (Patient not taking: Reported on 02/11/2022) 90 tablet 3   acetaminophen (TYLENOL) 500 MG tablet Take 2 tablets (1,000 mg total) by mouth every 6 (six) hours as needed for mild pain. (Patient not taking: Reported on 02/11/2022)     gabapentin (NEURONTIN) 100 MG capsule Take 1 capsule (100 mg total) by mouth 3 (three) times daily as needed. (Patient not taking: Reported on 02/11/2022) 60 capsule 1   methocarbamol (ROBAXIN) 500 MG tablet Take 1 tablet (500 mg total) by mouth 2 (two) times daily. 20 tablet 0   tiZANidine (ZANAFLEX) 4 MG tablet Take 1 tablet (4 mg total) by mouth every 8 (eight) hours as needed for muscle spasms. 40 tablet 0   No facility-administered medications prior to visit.   Allergies  Allergen Reactions   Dilaudid [Hydromorphone Hcl] Other (See Comments)    Broke in sweat and started shaking, can take oral   Sulfa Antibiotics Hives   Tylox [Oxycodone-Acetaminophen] Nausea And Vomiting   Clindamycin/Lincomycin Rash    hives    Objective:   Today's Vitals   02/11/22 0808  BP: 126/80  Pulse: 84  Temp: (!) 97.2 F (36.2 C)  TempSrc: Temporal  SpO2: 97%  Weight: 185 lb (83.9 kg)  Height: '4\' 11"'$  (1.499 m)   Body mass index is 37.37 kg/m.   General: Well developed, well nourished. No acute distress. Chest: Tenderness on palpation on the upper pectoral region. Psych: Alert and oriented. Normal mood and affect.  Health Maintenance Due  Topic Date Due   Zoster Vaccines- Shingrix (2 of 2) 12/29/2020   MAMMOGRAM  01/05/2022        02/11/2022    8:42 AM 11/12/2021    8:08 AM 07/19/2021   11:50 AM   Depression screen PHQ 2/9  Decreased Interest 0 0 0  Down, Depressed, Hopeless 0 0 0  PHQ - 2 Score 0 0 0  Altered sleeping 0 0 0  Tired, decreased energy 0 0 0  Change in appetite 0 0 0  Feeling bad or failure about yourself  0 0 0  Trouble concentrating 0 0 0  Moving slowly or fidgety/restless 0 0 0  Suicidal thoughts 0 0 0  PHQ-9 Score 0 0 0  Difficult doing work/chores Not difficult at  all Not difficult at all       02/11/2022    8:42 AM 03/28/2021   11:43 AM 11/29/2020    9:13 AM 04/26/2020    3:57 PM  GAD 7 : Generalized Anxiety Score  Nervous, Anxious, on Edge 0 0 0   Control/stop worrying 0 0 0 3  Worry too much - different things 0 0 0 2  Trouble relaxing 0 0 0 2  Restless 0 0 0 1  Easily annoyed or irritable 0 0 0 0  Afraid - awful might happen 0 0 0 3  Total GAD 7 Score 0 0 0   Anxiety Difficulty Not difficult at all Not difficult at all Not difficult at all    Assessment & Plan:   1. Type 2 diabetes mellitus without complication, without long-term current use of insulin (HCC) We will reassess the A1c today. Continue sitagliptin (Januvia) 100 mg daily and metformin 500 mg daily.  - Glucose, random - Hemoglobin A1c  2. Essential hypertension Blood pressure is at goal. Continue Benicar HCT 40-12.5 mg daily.  3. Adhesive capsulitis of left shoulder The upper chest pain appears to be related to the pectoral muscles, likely as another aspect of her frozen shoulder. I recommend she continue to work with PT and orthopedics.  4. Hyperlipidemia, unspecified hyperlipidemia type We will recheck her lipid levels. Continue rosuvastatin 10 mg daily.  - Lipid panel  5. Depression with anxiety In remission. Continue paroxetine 20 mg daily and PRN clonazepam.   Return in about 3 months (around 05/13/2022) for Reassessment.   Haydee Salter, MD

## 2022-02-12 ENCOUNTER — Encounter: Payer: Self-pay | Admitting: Physical Therapy

## 2022-02-12 ENCOUNTER — Ambulatory Visit: Payer: 59 | Admitting: Physical Therapy

## 2022-02-12 DIAGNOSIS — M7502 Adhesive capsulitis of left shoulder: Secondary | ICD-10-CM | POA: Diagnosis not present

## 2022-02-12 DIAGNOSIS — M25512 Pain in left shoulder: Secondary | ICD-10-CM

## 2022-02-12 NOTE — Therapy (Signed)
OUTPATIENT PHYSICAL THERAPY TREATMENT NOTE   Patient Name: [name] MRN: [id number] DOB:08-07-1965, 56 y.o., female Today's Date: 02/12/2022  PCP: Haydee Salter, MD    REFERRING PROVIDER: Faythe Casa, PA   END OF SESSION:   PT End of Session - 02/12/22 1630     Visit Number 4    Number of Visits 12    Date for PT Re-Evaluation 03/14/22    Authorization Type [employer]    Authorization - Visit Number 15    Authorization - Number of Visits 20   annual visit limit   Progress Note Due on Visit 10    PT Start Time 1630    PT Stop Time 1713    PT Time Calculation (min) 43 min    Activity Tolerance Patient tolerated treatment well;Patient limited by pain    Behavior During Therapy WFL for tasks assessed/performed               Past Medical History:  Diagnosis Date   Abnormal uterine bleeding (AUB)    Anxiety    Arthritis    Diabetes mellitus    Elevated cholesterol    Endometrial polyp    Endometrial polyp    Endometriosis    Frozen shoulder    Hypertension    Migraines    Nodule    Ovarian cyst    Past Surgical History:  Procedure Laterality Date   COMBINED HYSTEROSCOPY DIAGNOSTIC / D&C  yrs ago   [place]   with laser adhesions   DILITATION & CURRETTAGE/HYSTROSCOPY WITH NOVASURE ABLATION N/A 11/04/2019   Procedure: DILATATION & CURETTAGE/HYSTEROSCOPY WITH NOVASURE ABLATION;  Surgeon: Joseph Pierini, MD;  Location: [facility];  Service: Gynecology;  Laterality: N/A;   INTRAUTERINE DEVICE INSERTION     mirena-Inserted 05-16-14   KNEE SURGERY Left yrs ago   meniscurs tear repair   mirena removed  2021   OOPHORECTOMY  2008   left   ROTATOR CUFF REPAIR Right yrs ago   WRIST SURGERY Right    gang. cyst   Patient Active Problem List   Diagnosis Date Noted   Adhesive capsulitis of left shoulder 01/31/2022   Depression with anxiety 09/27/2020   Migraine headache 09/27/2020   Insomnia 09/27/2020    Thyroid nodule 09/27/2020   Class 2 obesity due to excess calories with body mass index (BMI) of 37.0 to 37.9 in adult 03/09/2019   Acute right-sided low back pain with right-sided sciatica 06/10/2017   Trochanteric bursitis, right hip 05/27/2017   Atypical chest pain 11/18/2012   Type 2 diabetes mellitus ([place]) 11/18/2012   Essential hypertension 11/18/2012   Hyperlipidemia     REFERRING DIAG: M75.02 (ICD-10-CM) - Adhesive bursitis of left shoulder    THERAPY DIAG:  Adhesive capsulitis of left shoulder  Acute pain of left shoulder  Rationale for Evaluation and Treatment Rehabilitation  PERTINENT HISTORY: 2-3 month hx of L shoulder pain, injection 2 weeks ago provided some relief as well as an increase in AROM. Has difficulty reaching OH and B/H back as well as sleep positions.  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 No significant pain at present. Reports reduced number/severity of flareups over past week.   PAIN:  Are you having pain? Yes: NPRS scale: 0/10 Pain location: L shoulder Pain description: ache Aggravating factors: AROM Relieving factors: rest   OBJECTIVE: (objective measures completed at initial evaluation unless otherwise dated)   DIAGNOSTIC FINDINGS:  None available   PATIENT SURVEYS:  FOTO 54(64 predicted)   COGNITION: Overall cognitive status: Within functional limits for tasks assessed                                  SENSATION: Not tested   POSTURE: Slightly rounded and depressed L shoulder   UPPER EXTREMITY ROM:    A/PROM Right eval Left eval Left AROM 02/07/22  Shoulder flexion   95/125d 112 p!   Shoulder extension       Shoulder abduction   70/90d   Shoulder adduction       Shoulder internal rotation Tip of scapula L SI joint/70d   Shoulder external rotation T3 C7/30d   Elbow flexion       Elbow extension       Wrist flexion       Wrist extension       Wrist ulnar deviation       Wrist radial deviation       Wrist pronation       Wrist supination       (Blank rows = not tested)   UPPER EXTREMITY MMT:   MMT Right eval Left eval  Shoulder flexion   3+  Shoulder extension   4+  Shoulder abduction   3+  Shoulder adduction      Shoulder internal rotation   4+  Shoulder external rotation   3+  Middle trapezius      Lower trapezius      Elbow flexion      Elbow extension      Wrist flexion      Wrist extension      Wrist ulnar deviation      Wrist radial deviation      Wrist pronation      Wrist supination      Grip strength (lbs)      (Blank rows = not tested)   SHOULDER SPECIAL TESTS: N/A   JOINT MOBILITY TESTING:  deferred   PALPATION:  TTP distal tip of acromion/AC joint             TODAY'S TREATMENT:         [facility] Adult PT Treatment:                                                DATE: 02/10/22 Therapeutic Exercise: Shoulder flexion iso 2x6 cues for setup and appropriate force Shoulder ER iso 2x6 cues for setup and reduced compensations 7# cable column rows B2x8 cues for appropriate form and reduced compensations Swiss ball flexion standing at table x10 cues for appropriate ROM and pacing, intermittent rest breaks required Manual Therapy: Seated: STM L deltoid, UT, LS, rhomboid. Biceps. Lateral portion of pec major/pec minor.    [facility] Adult PT Treatment:  DATE: 02/07/22 Therapeutic Exercise: Swiss ball flexion at table 2x10 cues for appropriate ROM and breath control Scap retraction x15 cues for form and pacing Swiss ball scaption at table x10  Manual Therapy: Seated STM deltoid, levator scap, upper  trap. Biceps. Trigger point release biceps                                                                                                   PATIENT EDUCATION: Education details: rationale for interventions, relevant anatomy/physiology Person educated: Patient Education method: Explanation Education comprehension: verbalized understanding and needs further education   HOME EXERCISE PROGRAM: Access Code: [email or web address] URL: [email or web address] Date: 01/17/2022 Prepared by: Sharlynn Oliphant   Exercises - Supine Alternating Shoulder Flexion  - 5 x daily - 5 x weekly - 1 sets - 10 reps - Standing Shoulder Scaption  - 5 x daily - 5 x weekly - 1 sets - 10 reps   ASSESSMENT:   CLINICAL IMPRESSION: Pt arrives without significant resting pain, primary report of stiffness. Reports reduced flareups over past week. Today's session emphasizing introduction of [place] isometrics and periscapular strengthening within comfortable ROM. Pt reports muscular fatigue but overall demonstrates improved activity tolerance compared to previous session. Pt reports increase in pain to 3/10 with shoulder flexion stretches. Session ended w/ STM to above musculature; remains quite TTP throughout periscapular musculature and anterior shoulder, although reduced trigger points in biceps and deltoid compared to last session, no provocation of distal UE symptoms. Reports improved stiffness post manual but denies any overt changes in pain. Overall improved tolerance to session today, no adverse events. Continues to demonstrate significant limitations in [facility] mobility. Pt departs today's session in no acute distress, all voiced questions/concerns addressed appropriately from PT perspective.      OBJECTIVE IMPAIRMENTS: decreased activity tolerance, decreased knowledge of condition, decreased mobility, decreased ROM, decreased strength, impaired UE functional use, and pain.    ACTIVITY LIMITATIONS: carrying, lifting, sleeping,  bed mobility, and reach over head   PERSONAL FACTORS: Time since onset of injury/illness/exacerbation and 1 comorbidity: DM  are also affecting patient's functional outcome.    REHAB POTENTIAL: Good   CLINICAL DECISION MAKING: Stable/uncomplicated   EVALUATION COMPLEXITY: Low     GOALS: Goals reviewed with patient? No   SHORT TERM GOALS: Target date: 02/07/22   Patient to demonstrate independence in HEP  Baseline:[id number] Goal status: INITIAL   2.  Increase AROM to 120d flexion and abduction Baseline:  A/PROM Right eval Left eval  Shoulder flexion   95/125d  Shoulder extension      Shoulder abduction   70/90d  Shoulder adduction      Shoulder internal rotation Tip of scapula L SI joint/70d  Shoulder external rotation T3 C7/30d    Goal status: INITIAL   3.  Decrease pain at night to 6/10 Baseline: 8/10 Goal status: INITIAL       LONG TERM GOALS: Target date: 03/02/22   Increase AROM to 150d flexion and abduction Baseline:  A/PROM Right eval Left eval  Shoulder flexion   95/125d  Shoulder extension      Shoulder abduction  70/90d  Shoulder adduction      Shoulder internal rotation Tip of scapula L SI joint/70d  Shoulder external rotation T3 C7/30d    Goal status: INITIAL   2.  Increase L shoulder strength to 4/5 in deficit areas Baseline:  MMT Right eval Left eval  Shoulder flexion   3+  Shoulder extension   4+  Shoulder abduction   3+  Shoulder adduction      Shoulder internal rotation   4+  Shoulder external rotation   3+    Goal status: INITIAL   3.  Increase FOTO score to 64 Baseline: 54 Goal status: INITIAL   4.  Decrease worst pain to 4/10 Baseline: 8/10 Goal status: INITIAL       PLAN:   PT FREQUENCY: 2x/week   PT DURATION: 6 weeks   PLANNED INTERVENTIONS: Therapeutic exercises, Therapeutic activity, Neuromuscular re-education, Balance training, Gait training, Patient/Family education, Self Care, Joint mobilization, Joint  manipulation, Dry Needling, Manual therapy, and Re-evaluation   PLAN FOR NEXT SESSION:   HEP review and update. Manual as indicated. Continue isometrics/stretching as able/appropriate   Leeroy Cha PT, DPT 02/12/2022 5:19 PM

## 2022-02-18 NOTE — Therapy (Signed)
OUTPATIENT PHYSICAL THERAPY TREATMENT NOTE   Patient Name: [name] MRN: [id number] DOB:1965-03-20, 56 y.o., female Today's Date: 02/19/2022  PCP: Haydee Salter, MD    REFERRING PROVIDER: Faythe Casa, PA   END OF SESSION:   PT End of Session - 02/19/22 1016     Visit Number 5    Number of Visits 12    Date for PT Re-Evaluation 03/14/22    Authorization Type [employer]    Authorization - Visit Number 16    Authorization - Number of Visits 20   annual limit   PT Start Time 1016    PT Stop Time 1058    PT Time Calculation (min) 42 min    Activity Tolerance Patient tolerated treatment well;Patient limited by pain    Behavior During Therapy WFL for tasks assessed/performed                Past Medical History:  Diagnosis Date   Abnormal uterine bleeding (AUB)    Anxiety    Arthritis    Diabetes mellitus    Elevated cholesterol    Endometrial polyp    Endometrial polyp    Endometriosis    Frozen shoulder    Hypertension    Migraines    Nodule    Ovarian cyst    Past Surgical History:  Procedure Laterality Date   COMBINED HYSTEROSCOPY DIAGNOSTIC / D&C  yrs ago   [place]   with laser adhesions   DILITATION & CURRETTAGE/HYSTROSCOPY WITH NOVASURE ABLATION N/A 11/04/2019   Procedure: DILATATION & CURETTAGE/HYSTEROSCOPY WITH NOVASURE ABLATION;  Surgeon: Joseph Pierini, MD;  Location: [place];  Service: Gynecology;  Laterality: N/A;   INTRAUTERINE DEVICE INSERTION     mirena-Inserted 05-16-14   KNEE SURGERY Left yrs ago   meniscurs tear repair   mirena removed  2021   OOPHORECTOMY  2008   left   ROTATOR CUFF REPAIR Right yrs ago   WRIST SURGERY Right    gang. cyst   Patient Active Problem List   Diagnosis Date Noted   Adhesive capsulitis of left shoulder 01/31/2022   Depression with anxiety 09/27/2020   Migraine headache 09/27/2020   Insomnia 09/27/2020   Thyroid nodule 09/27/2020   Class 2 obesity  due to excess calories with body mass index (BMI) of 37.0 to 37.9 in adult 03/09/2019   Acute right-sided low back pain with right-sided sciatica 06/10/2017   Trochanteric bursitis, right hip 05/27/2017   Atypical chest pain 11/18/2012   Type 2 diabetes mellitus ([place]) 11/18/2012   Essential hypertension 11/18/2012   Hyperlipidemia     REFERRING DIAG: M75.02 (ICD-10-CM) - Adhesive bursitis of left shoulder    THERAPY DIAG:  Adhesive capsulitis of left shoulder  Acute pain of left shoulder  Rationale for Evaluation and Treatment Rehabilitation  PERTINENT HISTORY: 2-3 month hx of L shoulder pain, injection 2 weeks ago provided some relief as well as an increase in AROM. Has difficulty reaching OH and B/H back as well as sleep positions.  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 Denies any significant soreness/pain after last session. Pt does request visual inspection of R and L shoulders as she has noted some indentation and tenderness around her bra strap line that she would like to make sure is WNL.     PAIN:  Are you having pain? Yes: NPRS scale: 4/10 Pain location: L shoulder Pain description: ache Aggravating factors: AROM Relieving factors: rest   OBJECTIVE: (objective measures completed at initial evaluation unless otherwise dated)   DIAGNOSTIC FINDINGS:  None available   PATIENT SURVEYS:  FOTO 54(64 predicted)   COGNITION: Overall cognitive status: Within functional limits for tasks assessed                                  SENSATION: Not tested   POSTURE: Slightly rounded and depressed L shoulder   UPPER  EXTREMITY ROM:    A/PROM Right eval Left eval Left AROM 02/07/22  Shoulder flexion   95/125d 112 p!  Shoulder extension       Shoulder abduction   70/90d   Shoulder adduction       Shoulder internal rotation Tip of scapula L SI joint/70d   Shoulder external rotation T3 C7/30d   Elbow flexion       Elbow extension       Wrist flexion       Wrist extension       Wrist ulnar deviation       Wrist radial deviation       Wrist pronation       Wrist supination       (Blank rows = not tested)   UPPER EXTREMITY MMT:   MMT Right eval Left eval  Shoulder flexion   3+  Shoulder extension   4+  Shoulder abduction   3+  Shoulder adduction      Shoulder internal rotation   4+  Shoulder external rotation   3+  Middle trapezius      Lower trapezius      Elbow flexion      Elbow extension      Wrist flexion      Wrist extension      Wrist ulnar deviation      Wrist radial deviation      Wrist pronation      Wrist supination      Grip strength (lbs)      (Blank rows = not tested)   SHOULDER SPECIAL TESTS: N/A   JOINT MOBILITY TESTING:  deferred   PALPATION:  TTP distal tip of acromion/AC joint             TODAY'S TREATMENT:         [facility] Adult PT Treatment:                                                DATE: 02/19/22 Therapeutic Exercise: Seated ER AAROM w/ cane 2x10 cues for elbow positioning and reduced compensations, rest breaks Finger ladder 2x5 laps within tolerance, cues for form and reduced compensation at shoulder blade, rest breaks 7# cable column rows 2x12 cues for form and reduced compensation  Manual Therapy: Seated: STM L deltoid, UT, LS, rhomboid. Biceps. Lateral  portion of pec major/pec minor.  Supine: L GH passive physiological movement  all planes to pt tolerance, gentle oscillations for reduced muscle guarding. Grade 1-2 GH distraction in neutral and ~25deg abduction   [facility] Adult PT Treatment:                                                DATE:  02/10/22 Therapeutic Exercise: Shoulder flexion iso 2x6 cues for setup and appropriate force Shoulder ER iso 2x6 cues for setup and reduced compensations 7# cable column rows B2x8 cues for appropriate form and reduced compensations Swiss ball flexion standing at table x10 cues for appropriate ROM and pacing, intermittent rest breaks required Manual Therapy: Seated: STM L deltoid, UT, LS, rhomboid. Biceps. Lateral portion of pec major/pec minor.    [place] Adult PT Treatment:                                                DATE: 02/07/22 Therapeutic Exercise: Swiss ball flexion at table 2x10 cues for appropriate ROM and breath control Scap retraction x15 cues for form and pacing Swiss ball scaption at table x10  Manual Therapy: Seated STM deltoid, levator scap, upper trap. Biceps. Trigger point release biceps                                                                                                   PATIENT EDUCATION: Education details: rationale for interventions, relevant anatomy/physiology Person educated: Patient Education method: Explanation Education comprehension: verbalized understanding and needs further education   HOME EXERCISE PROGRAM: Access Code: [email or web address] URL: [email or web address] Date: 01/17/2022 Prepared by: Sharlynn Oliphant   Exercises - Supine Alternating Shoulder Flexion  - 5 x daily - 5 x weekly - 1 sets - 10 reps - Standing Shoulder Scaption  - 5 x daily - 5 x weekly - 1 sets - 10 reps   ASSESSMENT:   CLINICAL IMPRESSION: Pt arrives w/ 4/10 pain on NPS, reports about the same number of flareups per week. Pt requests visual inspection of B shoulder joints d/t noticing some indentation along bra strap line - appropriate draping techniques employed, B shoulder joints grossly symmetrical and WNL, tenderness along L AC joint and coracoid process, no apparent deformities or erythema. Education on monitoring and communication w/ provider if  significant changes occur but no apparent issues on today's exam, pt verbalizes understanding/agreement. Followed with exercises emphasizing GH mobility, some irritability with ER as expected but pt reports improved tolerance to finger ladder w/ repetition, able to increase volume w/ periscapular strengthening. Manual therapy prior to departure as above, pt reporting improved symptoms afterwards. No adverse events, pt continues to demonstrate relatively improved tolerance although symptoms remain irritable overall. Pt departs today's session in no acute distress, all voiced questions/concerns addressed appropriately from PT perspective.      OBJECTIVE IMPAIRMENTS:  decreased activity tolerance, decreased knowledge of condition, decreased mobility, decreased ROM, decreased strength, impaired UE functional use, and pain.    ACTIVITY LIMITATIONS: carrying, lifting, sleeping, bed mobility, and reach over head   PERSONAL FACTORS: Time since onset of injury/illness/exacerbation and 1 comorbidity: DM  are also affecting patient's functional outcome.    REHAB POTENTIAL: Good   CLINICAL DECISION MAKING: Stable/uncomplicated   EVALUATION COMPLEXITY: Low     GOALS: Goals reviewed with patient? No   SHORT TERM GOALS: Target date: 02/07/22   Patient to demonstrate independence in HEP  Baseline:[id number] Goal status: INITIAL   2.  Increase AROM to 120d flexion and abduction Baseline:  A/PROM Right eval Left eval  Shoulder flexion   95/125d  Shoulder extension      Shoulder abduction   70/90d  Shoulder adduction      Shoulder internal rotation Tip of scapula L SI joint/70d  Shoulder external rotation T3 C7/30d    Goal status: INITIAL   3.  Decrease pain at night to 6/10 Baseline: 8/10 Goal status: INITIAL       LONG TERM GOALS: Target date: 03/02/22   Increase AROM to 150d flexion and abduction Baseline:  A/PROM Right eval Left eval  Shoulder flexion   95/125d  Shoulder extension       Shoulder abduction   70/90d  Shoulder adduction      Shoulder internal rotation Tip of scapula L SI joint/70d  Shoulder external rotation T3 C7/30d    Goal status: INITIAL   2.  Increase L shoulder strength to 4/5 in deficit areas Baseline:  MMT Right eval Left eval  Shoulder flexion   3+  Shoulder extension   4+  Shoulder abduction   3+  Shoulder adduction      Shoulder internal rotation   4+  Shoulder external rotation   3+    Goal status: INITIAL   3.  Increase FOTO score to 64 Baseline: 54 Goal status: INITIAL   4.  Decrease worst pain to 4/10 Baseline: 8/10 Goal status: INITIAL       PLAN:   PT FREQUENCY: 2x/week   PT DURATION: 6 weeks   PLANNED INTERVENTIONS: Therapeutic exercises, Therapeutic activity, Neuromuscular re-education, Balance training, Gait training, Patient/Family education, Self Care, Joint mobilization, Joint manipulation, Dry Needling, Manual therapy, and Re-evaluation   PLAN FOR NEXT SESSION:    HEP review and update. Manual as indicated. Continue isometrics/stretching as able/appropriate   Leeroy Cha PT, DPT 02/19/2022 12:09 PM

## 2022-02-19 ENCOUNTER — Encounter: Payer: Self-pay | Admitting: Physical Therapy

## 2022-02-19 ENCOUNTER — Ambulatory Visit: Payer: 59 | Admitting: Physical Therapy

## 2022-02-19 DIAGNOSIS — M25512 Pain in left shoulder: Secondary | ICD-10-CM

## 2022-02-19 DIAGNOSIS — M7502 Adhesive capsulitis of left shoulder: Secondary | ICD-10-CM | POA: Diagnosis not present

## 2022-02-20 ENCOUNTER — Telehealth: Payer: Self-pay | Admitting: Family Medicine

## 2022-02-20 NOTE — Therapy (Incomplete)
OUTPATIENT PHYSICAL THERAPY TREATMENT NOTE   Patient Name: [name] MRN: [id number] DOB:03/25/1965, 56 y.o., female Today's Date: 02/20/2022  PCP: Haydee Salter, MD    REFERRING PROVIDER: Jonelle Sidle D, PA   END OF SESSION:        Past Medical History:  Diagnosis Date   Abnormal uterine bleeding (AUB)    Anxiety    Arthritis    Diabetes mellitus    Elevated cholesterol    Endometrial polyp    Endometrial polyp    Endometriosis    Frozen shoulder    Hypertension    Migraines    Nodule    Ovarian cyst    Past Surgical History:  Procedure Laterality Date   COMBINED HYSTEROSCOPY DIAGNOSTIC / D&C  yrs ago   [place]   with laser adhesions   DILITATION & CURRETTAGE/HYSTROSCOPY WITH NOVASURE ABLATION N/A 11/04/2019   Procedure: DILATATION & CURETTAGE/HYSTEROSCOPY WITH NOVASURE ABLATION;  Surgeon: Joseph Pierini, MD;  Location: [place];  Service: Gynecology;  Laterality: N/A;   INTRAUTERINE DEVICE INSERTION     mirena-Inserted 05-16-14   KNEE SURGERY Left yrs ago   meniscurs tear repair   mirena removed  2021   OOPHORECTOMY  2008   left   ROTATOR CUFF REPAIR Right yrs ago   WRIST SURGERY Right    gang. cyst   Patient Active Problem List   Diagnosis Date Noted   Adhesive capsulitis of left shoulder 01/31/2022   Depression with anxiety 09/27/2020   Migraine headache 09/27/2020   Insomnia 09/27/2020   Thyroid nodule 09/27/2020   Class 2 obesity due to excess calories with body mass index (BMI) of 37.0 to 37.9 in adult 03/09/2019   Acute right-sided low back pain with right-sided sciatica 06/10/2017   Trochanteric bursitis, right hip 05/27/2017   Atypical chest pain 11/18/2012   Type 2 diabetes mellitus ([place]) 11/18/2012   Essential hypertension 11/18/2012   Hyperlipidemia     REFERRING DIAG: M75.02 (ICD-10-CM) - Adhesive bursitis of left shoulder    THERAPY DIAG:  No diagnosis found.  Rationale  for Evaluation and Treatment Rehabilitation  PERTINENT HISTORY: 2-3 month hx of L shoulder pain, injection 2 weeks ago provided some relief as well as an increase in AROM. Has difficulty reaching OH and B/H back as well as sleep positions.  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 Denies any significant soreness/pain after last session. Pt does request visual inspection of R and L shoulders as she has noted some indentation and tenderness around her bra strap line that she would like to make sure is WNL.     PAIN:  Are you having pain? Yes: NPRS scale: 4/10 Pain location: L shoulder Pain description: ache Aggravating factors: AROM Relieving factors: rest   OBJECTIVE: (objective measures completed at initial evaluation unless otherwise dated)   DIAGNOSTIC FINDINGS:  None available   PATIENT SURVEYS:  FOTO 54(64 predicted)   COGNITION: Overall cognitive status: Within functional limits for tasks assessed                                  SENSATION: Not tested   POSTURE: Slightly rounded and depressed L shoulder   UPPER EXTREMITY ROM:    A/PROM Right eval Left eval Left AROM 02/07/22  Shoulder flexion   95/125d 112 p!  Shoulder extension       Shoulder abduction   70/90d   Shoulder adduction       Shoulder internal rotation Tip of scapula L SI joint/70d   Shoulder external rotation T3 C7/30d   Elbow flexion       Elbow extension       Wrist flexion       Wrist extension       Wrist ulnar deviation       Wrist radial deviation       Wrist pronation       Wrist supination       (Blank rows = not  tested)   UPPER EXTREMITY MMT:   MMT Right eval Left eval  Shoulder flexion   3+  Shoulder extension   4+  Shoulder abduction   3+  Shoulder adduction      Shoulder internal rotation   4+  Shoulder external rotation   3+  Middle trapezius      Lower trapezius      Elbow flexion      Elbow extension      Wrist flexion      Wrist extension      Wrist ulnar deviation      Wrist radial deviation      Wrist pronation      Wrist supination      Grip strength (lbs)      (Blank rows = not tested)   SHOULDER SPECIAL TESTS: N/A   JOINT MOBILITY TESTING:  deferred   PALPATION:  TTP distal tip of acromion/AC joint             TODAY'S TREATMENT:         [facility] Adult PT Treatment:                                                DATE: 02/21/22 Therapeutic Exercise: *** Manual Therapy: *** Neuromuscular re-ed: *** Therapeutic Activity: *** Modalities: *** Self Care: [facility] Adult PT Treatment:  DATE: 02/19/22 Therapeutic Exercise: Seated ER AAROM w/ cane 2x10 cues for elbow positioning and reduced compensations, rest breaks Finger ladder 2x5 laps within tolerance, cues for form and reduced compensation at shoulder blade, rest breaks 7# cable column rows 2x12 cues for form and reduced compensation  Manual Therapy: Seated: STM L deltoid, UT, LS, rhomboid. Biceps. Lateral portion of pec major/pec minor.  Supine: L GH passive physiological movement  all planes to pt tolerance, gentle oscillations for reduced muscle guarding. Grade 1-2 GH distraction in neutral and ~25deg abduction   [facility] Adult PT Treatment:                                                DATE: 02/10/22 Therapeutic Exercise: Shoulder flexion iso 2x6 cues for setup and appropriate force Shoulder ER iso 2x6 cues for setup and reduced compensations 7# cable column rows B2x8 cues for appropriate form and reduced compensations Swiss ball flexion standing at table  x10 cues for appropriate ROM and pacing, intermittent rest breaks required Manual Therapy: Seated: STM L deltoid, UT, LS, rhomboid. Biceps. Lateral portion of pec major/pec minor.                                                                                                  PATIENT EDUCATION: Education details: rationale for interventions, relevant anatomy/physiology Person educated: Patient Education method: Explanation Education comprehension: verbalized understanding and needs further education   HOME EXERCISE PROGRAM: Access Code: [email or web address] URL: [email or web address] Date: 01/17/2022 Prepared by: Sharlynn Oliphant   Exercises - Supine Alternating Shoulder Flexion  - 5 x daily - 5 x weekly - 1 sets - 10 reps - Standing Shoulder Scaption  - 5 x daily - 5 x weekly - 1 sets - 10 reps   ASSESSMENT:   CLINICAL IMPRESSION: ***  *** Pt arrives w/ 4/10 pain on NPS, reports about the same number of flareups per week. Pt requests visual inspection of B shoulder joints d/t noticing some indentation along bra strap line - appropriate draping techniques employed, B shoulder joints grossly symmetrical and WNL, tenderness along L AC joint and coracoid process, no apparent deformities or erythema. Education on monitoring and communication w/ provider if significant changes occur but no apparent issues on today's exam, pt verbalizes understanding/agreement. Followed with exercises emphasizing GH mobility, some irritability with ER as expected but pt reports improved tolerance to finger ladder w/ repetition, able to increase volume w/ periscapular strengthening. Manual therapy prior to departure as above, pt reporting improved symptoms afterwards. No adverse events, pt continues to demonstrate relatively improved tolerance although symptoms remain irritable overall. Pt departs today's session in no acute distress, all voiced questions/concerns addressed appropriately from PT perspective.       OBJECTIVE IMPAIRMENTS: decreased activity tolerance, decreased knowledge of condition, decreased mobility, decreased ROM, decreased strength, impaired UE functional use, and pain.    ACTIVITY LIMITATIONS: carrying, lifting, sleeping, bed mobility, and reach over head   PERSONAL FACTORS: Time since  onset of injury/illness/exacerbation and 1 comorbidity: DM  are also affecting patient's functional outcome.    REHAB POTENTIAL: Good   CLINICAL DECISION MAKING: Stable/uncomplicated   EVALUATION COMPLEXITY: Low     GOALS: Goals reviewed with patient? No   SHORT TERM GOALS: Target date: 02/07/22   Patient to demonstrate independence in HEP  Baseline:[id number] Goal status: INITIAL   2.  Increase AROM to 120d flexion and abduction Baseline:  A/PROM Right eval Left eval  Shoulder flexion   95/125d  Shoulder extension      Shoulder abduction   70/90d  Shoulder adduction      Shoulder internal rotation Tip of scapula L SI joint/70d  Shoulder external rotation T3 C7/30d    Goal status: INITIAL   3.  Decrease pain at night to 6/10 Baseline: 8/10 Goal status: INITIAL       LONG TERM GOALS: Target date: 03/02/22   Increase AROM to 150d flexion and abduction Baseline:  A/PROM Right eval Left eval  Shoulder flexion   95/125d  Shoulder extension      Shoulder abduction   70/90d  Shoulder adduction      Shoulder internal rotation Tip of scapula L SI joint/70d  Shoulder external rotation T3 C7/30d    Goal status: INITIAL   2.  Increase L shoulder strength to 4/5 in deficit areas Baseline:  MMT Right eval Left eval  Shoulder flexion   3+  Shoulder extension   4+  Shoulder abduction   3+  Shoulder adduction      Shoulder internal rotation   4+  Shoulder external rotation   3+    Goal status: INITIAL   3.  Increase FOTO score to 64 Baseline: 54 Goal status: INITIAL   4.  Decrease worst pain to 4/10 Baseline: 8/10 Goal status: INITIAL       PLAN:   PT  FREQUENCY: 2x/week   PT DURATION: 6 weeks   PLANNED INTERVENTIONS: Therapeutic exercises, Therapeutic activity, Neuromuscular re-education, Balance training, Gait training, Patient/Family education, Self Care, Joint mobilization, Joint manipulation, Dry Needling, Manual therapy, and Re-evaluation   PLAN FOR NEXT SESSION:   *** HEP review and update. Manual as indicated. Continue isometrics/stretching as able/appropriate   Leeroy Cha PT, DPT 02/20/2022 8:41 AM

## 2022-02-20 NOTE — Telephone Encounter (Signed)
We transferred pt to [facility] triage

## 2022-02-20 NOTE — Telephone Encounter (Signed)
Pt  stated nausea,sugar dropped, pt feels real sleepy. Call pt

## 2022-02-20 NOTE — Telephone Encounter (Signed)
Spoke to [place], co-worker, she states that patients blood sugar dropped to 70, even though she had ate breakfast ham biscuit, and chocolate covered pretzels.  She started feeling sleepy and Nausea.  They gave her some crackers and regular [facility].    Her sugar now is back up to 107 but is very nauseous.   Spoke to Vance Peper, NP, advised for her to go home, check her blood sugars every hour and if they drop again to go to the ER to be checked out.  Advised to push drinking fluids, eat regular diet.  Scheduled an appointment with Dr Gena Fray 02/21/22 @ 9:20 am.   Patient agreeable to plan.  Dm/cma

## 2022-02-21 ENCOUNTER — Ambulatory Visit: Payer: 59 | Admitting: Physical Therapy

## 2022-02-21 ENCOUNTER — Encounter: Payer: Self-pay | Admitting: Family Medicine

## 2022-02-21 ENCOUNTER — Ambulatory Visit: Payer: 59 | Admitting: Family Medicine

## 2022-02-21 VITALS — BP 130/84 | HR 97 | Temp 97.0°F | Ht 59.0 in | Wt 185.2 lb

## 2022-02-21 DIAGNOSIS — F410 Panic disorder [episodic paroxysmal anxiety] without agoraphobia: Secondary | ICD-10-CM

## 2022-02-21 DIAGNOSIS — E119 Type 2 diabetes mellitus without complications: Secondary | ICD-10-CM

## 2022-02-21 NOTE — Progress Notes (Signed)
[facility] [address] [place] [address] Dept: [phone] Dept Fax: [phone]  Office Visit  Subjective:    Patient ID: [name], female    DOB: [birth year], 56 y.o..   MRN: [id number]  Chief Complaint  Patient presents with   Follow-up    F/u after having low blood sugar (70) yesterday.  She had ate yesterday ham biscuit and pretzels before low BS.     BS this am  130.  Also c/o having a nervous/upset stomach x 2-3 days.     History of Present Illness:  Patient is in today for assessment of an issue with a low blood sugar yesterday. [name] notes she was at work. She did not eat breakfast, but had been snacking on Christmas treats through the morning. She noted she started to feel some nausea and then drowsiness. A co-worker had her check her blood sugar. She reports that it was 77. She interpreted this as a "low blood sugar". She responded by drinking [facility], taking some peanut butter, and eating a candy cane. Within an hour, her blood sugar was 107. She continued to eat sweets over the next few hours. This morning her blood sugar was 130. She continues to have some stomach upset this morning.  [name] has a history of anxiety/depression. She had a flare of this earlier in the Fall, brought on by grief associated with a death in the family. However, she felt she was doing better. She had been  managed on paroxetine 20 mg daily and clonazepam 0.5 mg PRN. However, she reports that she had stopped taking her paroxetine. Recently, she had a 1st cousin that died. She is grieving about this. As such, she did take a dose of paroxetine yesterday. She uses 1/2 tab of her clonazepam when she feels panicky.  Past Medical History: Patient Active Problem List   Diagnosis Date Noted   Adhesive capsulitis of left shoulder 01/31/2022   Depression with anxiety 09/27/2020   Migraine headache 09/27/2020    Insomnia 09/27/2020   Thyroid nodule 09/27/2020   Class 2 obesity due to excess calories with body mass index (BMI) of 37.0 to 37.9 in adult 03/09/2019   Acute right-sided low back pain with right-sided sciatica 06/10/2017   Trochanteric bursitis, right hip 05/27/2017   Atypical chest pain 11/18/2012   Type 2 diabetes mellitus ([name]) 11/18/2012   Essential hypertension 11/18/2012   Hyperlipidemia    Past Surgical History:  Procedure Laterality Date   COMBINED HYSTEROSCOPY DIAGNOSTIC / D&C  yrs ago   [place]   with laser adhesions   DILITATION & CURRETTAGE/HYSTROSCOPY WITH NOVASURE ABLATION N/A 11/04/2019   Procedure: DILATATION & CURETTAGE/HYSTEROSCOPY WITH NOVASURE ABLATION;  Surgeon: Joseph Pierini, MD;  Location: [place];  Service: Gynecology;  Laterality: N/A;   INTRAUTERINE DEVICE INSERTION     mirena-Inserted 05-16-14   KNEE SURGERY Left yrs ago   meniscurs tear repair   mirena removed  2021   OOPHORECTOMY  2008   left   ROTATOR CUFF REPAIR Right yrs ago   WRIST SURGERY Right    gang. cyst   Family History  Problem Relation Age of Onset   Hypertension Mother    Diabetes Mother    COPD Mother    Cancer Mother        Lung   Hypertension Father    Stroke Father 33   Diabetes Sister    Hypertension Brother  Diabetes Maternal Aunt    Cancer Maternal Aunt        Kidney   Kidney disease Maternal Aunt    Diabetes Maternal Aunt    Stroke Maternal Grandfather    Diabetes Maternal Grandfather    Colon cancer Neg Hx    Esophageal cancer Neg Hx    Rectal cancer Neg Hx    Stomach cancer Neg Hx    Outpatient Medications Prior to Visit  Medication Sig Dispense Refill   Ascorbic Acid (VITAMIN C) 1000 MG tablet Take 1,000 mg by mouth daily.     cholecalciferol (VITAMIN D3) 25 MCG (1000 UNIT) tablet Take 1,000 Units by mouth daily.     clonazePAM (KLONOPIN) 0.5 MG tablet Take 1 tablet (0.5 mg total) by mouth 2 (two) times daily as  needed for anxiety. 30 tablet 1   ibuprofen (ADVIL) 800 MG tablet TAKE 1 TABLET 3 TIMES A DAY BY ORAL ROUTE.     JANUVIA 100 MG tablet TAKE 1 TABLET BY MOUTH DAILY AFTER BREAKFAST. 90 tablet 3   metFORMIN (GLUCOPHAGE) 500 MG tablet Take 1 tablet (500 mg total) by mouth daily with breakfast. 90 tablet 3   olmesartan-hydrochlorothiazide (BENICAR HCT) 40-12.5 MG tablet TAKE 1 TABLET BY MOUTH EVERY DAY 90 tablet 3   rosuvastatin (CRESTOR) 10 MG tablet Take 1 tablet (10 mg total) by mouth daily. 90 tablet 3   zinc gluconate 50 MG tablet Take 50 mg by mouth daily.     PARoxetine (PAXIL) 20 MG tablet Take 1 tablet (20 mg total) by mouth daily. (Patient not taking: Reported on 02/11/2022) 90 tablet 3   No facility-administered medications prior to visit.   Allergies  Allergen Reactions   Dilaudid [Hydromorphone Hcl] Other (See Comments)    Broke in sweat and started shaking, can take oral   Sulfa Antibiotics Hives   Tylox [Oxycodone-Acetaminophen] Nausea And Vomiting   Clindamycin/Lincomycin Rash    hives    Objective:   Today's Vitals   02/21/22 0817  BP: 130/84  Pulse: 97  Temp: (!) 97 F (36.1 C)  TempSrc: Temporal  SpO2: 98%  Weight: 185 lb 3.2 oz (84 kg)  Height: '4\' 11"'$  (1.499 m)   Body mass index is 37.41 kg/m.   General: Well developed, well nourished. No acute distress. Psych: Alert and oriented. Normal mood and affect.  Health Maintenance Due  Topic Date Due   MAMMOGRAM  01/05/2022     Assessment & Plan:   1. Anxiety attack I suspect that [name] is dealing with both a combination of her recent grief , on top of her recurrent depression. Restarting her Paxil may have brought out some of the nausea and headache she is experiencing. She may have also been having some reaction to the increase in concentrated sweets she was eating yesterday. I did recommend that she take her Paxil 20 mg daily, not intermittently. We will monitor this for now.  2. Type 2 diabetes  mellitus without complication, without long-term current use of insulin ([name]) I reviewed with [name] that normal blood sugar levels are 60-100. As such, a && blood sugar is not technically low. However, I did review with her about how to respond to hypoglycemia when it occurs. She should continue her current metformin 500 mg daily and sitagliptin 100 mg daily.  Return for As scheduled.   Haydee Salter, MD

## 2022-02-25 NOTE — Therapy (Signed)
OUTPATIENT PHYSICAL THERAPY TREATMENT NOTE   Patient Name: [name] MRN: [id number] DOB:[birth year], 56 y.o., female Today's Date: 02/26/2022  PCP: Haydee Salter, MD    REFERRING PROVIDER: Faythe Casa, PA   END OF SESSION:   PT End of Session - 02/26/22 0842     Visit Number 6    Number of Visits 12    Date for PT Re-Evaluation 03/14/22    Authorization Type [employer]    Authorization - Visit Number 62    Authorization - Number of Visits 20   annual visit limit   Progress Note Due on Visit 10    PT Start Time 0843    PT Stop Time 0925    PT Time Calculation (min) 42 min    Activity Tolerance Patient tolerated treatment well;No increased pain    Behavior During Therapy WFL for tasks assessed/performed                 Past Medical History:  Diagnosis Date   Abnormal uterine bleeding (AUB)    Anxiety    Arthritis    Diabetes mellitus    Elevated cholesterol    Endometrial polyp    Endometrial polyp    Endometriosis    Frozen shoulder    Hypertension    Migraines    Nodule    Ovarian cyst    Past Surgical History:  Procedure Laterality Date   COMBINED HYSTEROSCOPY DIAGNOSTIC / D&C  yrs ago   [place]   with laser adhesions   DILITATION & CURRETTAGE/HYSTROSCOPY WITH NOVASURE ABLATION N/A 11/04/2019   Procedure: DILATATION & CURETTAGE/HYSTEROSCOPY WITH NOVASURE ABLATION;  Surgeon: Joseph Pierini, MD;  Location: [place];  Service: Gynecology;  Laterality: N/A;   INTRAUTERINE DEVICE INSERTION     mirena-Inserted 05-16-14   KNEE SURGERY Left yrs ago   meniscurs tear repair   mirena removed  2021   OOPHORECTOMY  2008   left   ROTATOR CUFF REPAIR Right yrs ago   WRIST SURGERY Right    gang. cyst   Patient Active Problem List   Diagnosis Date Noted   Adhesive capsulitis of left shoulder 01/31/2022   Depression with anxiety 09/27/2020   Migraine headache 09/27/2020   Insomnia 09/27/2020   Thyroid  nodule 09/27/2020   Class 2 obesity due to excess calories with body mass index (BMI) of 37.0 to 37.9 in adult 03/09/2019   Acute right-sided low back pain with right-sided sciatica 06/10/2017   Trochanteric bursitis, right hip 05/27/2017   Atypical chest pain 11/18/2012   Type 2 diabetes mellitus ([place]) 11/18/2012   Essential hypertension 11/18/2012   Hyperlipidemia     REFERRING DIAG: M75.02 (ICD-10-CM) - Adhesive bursitis of left shoulder    THERAPY DIAG:  Adhesive capsulitis of left shoulder  Acute pain of left shoulder  Rationale for Evaluation and Treatment Rehabilitation  PERTINENT HISTORY: 2-3 month hx of L shoulder pain, injection 2 weeks ago provided some relief as well as an increase in AROM. Has difficulty reaching OH and B/H back as well as sleep positions.  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 Otherwise doing well, although she states she continues to have trouble lifting arm for ADLs.     PAIN:  Are you having pain? No overt pain, more sore/stiff Pain location: L shoulder Pain description: ache Aggravating factors: AROM Relieving factors: rest   OBJECTIVE: (objective measures completed at initial evaluation unless otherwise dated)   DIAGNOSTIC FINDINGS:  None available   PATIENT SURVEYS:  FOTO 54(64 predicted) 02/26/22: 54 FOTO    COGNITION: Overall cognitive status: Within functional limits for tasks assessed                                  SENSATION: Not tested   POSTURE: Slightly rounded and depressed L shoulder   UPPER EXTREMITY ROM:    A/PROM Right eval Left eval Left AROM 02/07/22  Shoulder flexion    95/125d 112 p!  Shoulder extension       Shoulder abduction   70/90d   Shoulder adduction       Shoulder internal rotation Tip of scapula L SI joint/70d   Shoulder external rotation T3 C7/30d   Elbow flexion       Elbow extension       Wrist flexion       Wrist extension       Wrist ulnar deviation       Wrist radial deviation       Wrist pronation       Wrist supination       (Blank rows = not tested)   UPPER EXTREMITY MMT:   MMT Right eval Left eval  Shoulder flexion   3+  Shoulder extension   4+  Shoulder abduction   3+  Shoulder adduction      Shoulder internal rotation   4+  Shoulder external rotation   3+  Middle trapezius      Lower trapezius      Elbow flexion      Elbow extension      Wrist flexion      Wrist extension      Wrist ulnar deviation      Wrist radial deviation      Wrist pronation      Wrist supination      Grip strength (lbs)      (Blank rows = not tested)   SHOULDER SPECIAL TESTS: N/A   JOINT MOBILITY TESTING:  deferred   PALPATION:  TTP distal tip of acromion/AC joint             TODAY'S TREATMENT:         [facility] Adult PT Treatment:                                                DATE: 02/26/22 Therapeutic Exercise: Pulley flex x60mn Pulley scaption x126m cues for setup L GH ER isometrics neutral and each end range x5 each, cues for setup, positioning and force ouptut L GH IR isometrics neutral and each end range x5 each cues for setup/positioning High>low supinated rows blue band 3x8 cues for form/pacing and setup Scapular rolls fwd/back x12 each  Scaption w/ strap for scapular dissociation 2x8 L GH only Manual Therapy: Seated: STM deltoid, biceps, lateral pecs, rhomboids, levator  scap. Trigger point release pec and levator scap   [facility] Adult PT Treatment:                                                DATE: 02/19/22 Therapeutic Exercise: Seated ER AAROM w/ cane 2x10 cues for elbow positioning and reduced compensations, rest  breaks Finger ladder 2x5 laps within tolerance, cues for form and reduced compensation at shoulder blade, rest breaks 7# cable column rows 2x12 cues for form and reduced compensation  Manual Therapy: Seated: STM L deltoid, UT, LS, rhomboid. Biceps. Lateral portion of pec major/pec minor.  Supine: L GH passive physiological movement  all planes to pt tolerance, gentle oscillations for reduced muscle guarding. Grade 1-2 GH distraction in neutral and ~25deg abduction   [facility] Adult PT Treatment:                                                DATE: 02/10/22 Therapeutic Exercise: Shoulder flexion iso 2x6 cues for setup and appropriate force Shoulder ER iso 2x6 cues for setup and reduced compensations 7# cable column rows B2x8 cues for appropriate form and reduced compensations Swiss ball flexion standing at table x10 cues for appropriate ROM and pacing, intermittent rest breaks required Manual Therapy: Seated: STM L deltoid, UT, LS, rhomboid. Biceps. Lateral portion of pec major/pec minor.                                                                                                  PATIENT EDUCATION: Education details: rationale for interventions, relevant anatomy/physiology Person educated: Patient Education method: Explanation Education comprehension: verbalized understanding and needs further education   HOME EXERCISE PROGRAM: Access Code: [email or web address] URL: [email or web address] Date: 02/26/2022 Prepared by: Enis Slipper  Program Notes for scaption - use belt and towel to keep shoulder blade down  Exercises - Standing Shoulder Scaption  - 1 x daily - 7 x weekly - 2 sets - 8 reps - Standing Shoulder External Rotation AAROM with Dowel  - 1 x daily - 7 x weekly - 2 sets - 8 reps   ASSESSMENT:   CLINICAL IMPRESSION: Pt arrives without significant pain, primary report of soreness/stiffness. Today's session focusing on [facility] and scapulothoracic mobility with progression of  isometrics to multiple ranges for improved carryover. Pt tolerates session well with cues as above, reports good relief from manual after exercise. No adverse events, pt denies any pain on departure and reports improvement in stiffness. Continues to demonstrate deficits in [facility] mobility and difficulty w/ scapular dissociation which would benefit from skilled PT. Pt departs today's session in no acute distress, all voiced questions/concerns addressed appropriately from PT perspective.       OBJECTIVE IMPAIRMENTS: decreased activity tolerance, decreased knowledge of condition, decreased mobility, decreased ROM, decreased strength, impaired UE functional use, and pain.    ACTIVITY  LIMITATIONS: carrying, lifting, sleeping, bed mobility, and reach over head   PERSONAL FACTORS: Time since onset of injury/illness/exacerbation and 1 comorbidity: DM  are also affecting patient's functional outcome.    REHAB POTENTIAL: Good   CLINICAL DECISION MAKING: Stable/uncomplicated   EVALUATION COMPLEXITY: Low     GOALS: Goals reviewed with patient? No   SHORT TERM GOALS: Target date: 02/07/22   Patient to demonstrate independence in HEP  Baseline:[id number] Goal status: MET   2.  Increase AROM to 120d flexion and abduction Baseline:  A/PROM Right eval Left eval  Shoulder flexion   95/125d  Shoulder extension      Shoulder abduction   70/90d  Shoulder adduction      Shoulder internal rotation Tip of scapula L SI joint/70d  Shoulder external rotation T3 C7/30d    Goal status: INITIAL   3.  Decrease pain at night to 6/10 Baseline: 8/10 Goal status: INITIAL       LONG TERM GOALS: Target date: 03/02/22   Increase AROM to 150d flexion and abduction Baseline:  A/PROM Right eval Left eval  Shoulder flexion   95/125d  Shoulder extension      Shoulder abduction   70/90d  Shoulder adduction      Shoulder internal rotation Tip of scapula L SI joint/70d  Shoulder external rotation T3 C7/30d     Goal status: INITIAL   2.  Increase L shoulder strength to 4/5 in deficit areas Baseline:  MMT Right eval Left eval  Shoulder flexion   3+  Shoulder extension   4+  Shoulder abduction   3+  Shoulder adduction      Shoulder internal rotation   4+  Shoulder external rotation   3+    Goal status: INITIAL   3.  Increase FOTO score to 64 Baseline: 54 Goal status: INITIAL   4.  Decrease worst pain to 4/10 Baseline: 8/10 Goal status: INITIAL       PLAN:   PT FREQUENCY: 2x/week   PT DURATION: 6 weeks   PLANNED INTERVENTIONS: Therapeutic exercises, Therapeutic activity, Neuromuscular re-education, Balance training, Gait training, Patient/Family education, Self Care, Joint mobilization, Joint manipulation, Dry Needling, Manual therapy, and Re-evaluation   PLAN FOR NEXT SESSION:    HEP review. Manual as indicated. Continue isometrics/stretching as able/appropriate. Could benefit from more emphasis on scapular dissociation from [facility] movement   Leeroy Cha PT, DPT 02/26/2022 9:28 AM

## 2022-02-26 ENCOUNTER — Encounter: Payer: Self-pay | Admitting: Physical Therapy

## 2022-02-26 ENCOUNTER — Ambulatory Visit: Payer: 59 | Admitting: Physical Therapy

## 2022-02-26 DIAGNOSIS — M25512 Pain in left shoulder: Secondary | ICD-10-CM

## 2022-02-26 DIAGNOSIS — M7502 Adhesive capsulitis of left shoulder: Secondary | ICD-10-CM

## 2022-02-28 ENCOUNTER — Ambulatory Visit: Payer: 59

## 2022-02-28 DIAGNOSIS — M7502 Adhesive capsulitis of left shoulder: Secondary | ICD-10-CM

## 2022-02-28 DIAGNOSIS — M25512 Pain in left shoulder: Secondary | ICD-10-CM

## 2022-02-28 NOTE — Therapy (Signed)
OUTPATIENT PHYSICAL THERAPY TREATMENT NOTE   Patient Name: [name] MRN: [id number] DOB:11-May-1965, 56 y.o., female Today's Date: 02/28/2022  PCP: Haydee Salter, MD    REFERRING PROVIDER: Faythe Casa, PA   END OF SESSION:   PT End of Session - 02/28/22 1214     Visit Number 7    Number of Visits 12    Date for PT Re-Evaluation 03/14/22    Authorization Type [employer]    Authorization - Visit Number 18    Authorization - Number of Visits 20    Progress Note Due on Visit 10    PT Start Time 1214    PT Stop Time 1302   10 minutes vasopneumatic treatment   PT Time Calculation (min) 48 min    Activity Tolerance Patient tolerated treatment well;No increased pain    Behavior During Therapy WFL for tasks assessed/performed                  Past Medical History:  Diagnosis Date   Abnormal uterine bleeding (AUB)    Anxiety    Arthritis    Diabetes mellitus    Elevated cholesterol    Endometrial polyp    Endometrial polyp    Endometriosis    Frozen shoulder    Hypertension    Migraines    Nodule    Ovarian cyst    Past Surgical History:  Procedure Laterality Date   COMBINED HYSTEROSCOPY DIAGNOSTIC / D&C  yrs ago   [place]   with laser adhesions   DILITATION & CURRETTAGE/HYSTROSCOPY WITH NOVASURE ABLATION N/A 11/04/2019   Procedure: DILATATION & CURETTAGE/HYSTEROSCOPY WITH NOVASURE ABLATION;  Surgeon: Joseph Pierini, MD;  Location: [place];  Service: Gynecology;  Laterality: N/A;   INTRAUTERINE DEVICE INSERTION     mirena-Inserted 05-16-14   KNEE SURGERY Left yrs ago   meniscurs tear repair   mirena removed  2021   OOPHORECTOMY  2008   left   ROTATOR CUFF REPAIR Right yrs ago   WRIST SURGERY Right    gang. cyst   Patient Active Problem List   Diagnosis Date Noted   Adhesive capsulitis of left shoulder 01/31/2022   Depression with anxiety 09/27/2020   Migraine headache 09/27/2020   Insomnia  09/27/2020   Thyroid nodule 09/27/2020   Class 2 obesity due to excess calories with body mass index (BMI) of 37.0 to 37.9 in adult 03/09/2019   Acute right-sided low back pain with right-sided sciatica 06/10/2017   Trochanteric bursitis, right hip 05/27/2017   Atypical chest pain 11/18/2012   Type 2 diabetes mellitus ([place]) 11/18/2012   Essential hypertension 11/18/2012   Hyperlipidemia     REFERRING DIAG: M75.02 (ICD-10-CM) - Adhesive bursitis of left shoulder    THERAPY DIAG:  Adhesive capsulitis of left shoulder  Acute pain of left shoulder  Rationale for Evaluation and Treatment Rehabilitation  PERTINENT HISTORY: 2-3 month hx of L shoulder pain, injection 2 weeks ago provided some relief as well as an increase in AROM. Has difficulty reaching OH and B/H back as well as sleep positions.  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 She rates her shoulder pain as 2/10.    PAIN:  Are you having pain? Yes, 2/10 Pain location: L shoulder Pain description: ache Aggravating factors: AROM Relieving factors: rest   OBJECTIVE: (objective measures completed at initial evaluation unless otherwise dated)   DIAGNOSTIC FINDINGS:  None available   PATIENT SURVEYS:  FOTO 54(64 predicted) 02/26/22: 92 FOTO    COGNITION: Overall cognitive status: Within functional limits for tasks assessed                                  SENSATION: Not tested   POSTURE: Slightly rounded and depressed L shoulder   UPPER EXTREMITY ROM:    A/PROM Right eval Left eval Left AROM 02/07/22  Shoulder flexion   95/125d 112 p!  Shoulder extension       Shoulder abduction   70/90d   Shoulder adduction        Shoulder internal rotation Tip of scapula L SI joint/70d   Shoulder external rotation T3 C7/30d   Elbow flexion       Elbow extension       Wrist flexion       Wrist extension       Wrist ulnar deviation       Wrist radial deviation       Wrist pronation       Wrist supination       (Blank rows = not tested)   UPPER EXTREMITY MMT:   MMT Right eval Left eval  Shoulder flexion   3+  Shoulder extension   4+  Shoulder abduction   3+  Shoulder adduction      Shoulder internal rotation   4+  Shoulder external rotation   3+  Middle trapezius      Lower trapezius      Elbow flexion      Elbow extension      Wrist flexion      Wrist extension      Wrist ulnar deviation      Wrist radial deviation      Wrist pronation      Wrist supination      Grip strength (lbs)      (Blank rows = not tested)   SHOULDER SPECIAL TESTS: N/A   JOINT MOBILITY TESTING:  deferred   PALPATION:  TTP distal tip of acromion/AC joint             TODAY'S TREATMENT:    [facility] Adult PT Treatment:                                                DATE: 02/28/2022 Therapeutic Exercise: Standing Lt UE ranger shoulder flexion AAROM 2x10 with 5-sec hold at end range Bent-over Lt shoulder pendulums 2x30sec Seated Lt shoulder ER AAROM with dowel 2x10 with 5-sec hold Seated Lt shoulder IR towel stretch x2 minutes Manual Therapy: Supine GHJ grade 3 mobilizations x19mn AP, PA, and inf Supine Lt UE manual distraction with oscillations x365m Neuromuscular re-ed: N/A Therapeutic Activity: N/A Modalities: Seated Lt shoulder GameReady vasopneumatic treatment x10 min without adverse response Self Care: N/A         [facility]  [facility]: 02[facility]22 Therapeutic Exercise: Pulley flex x96mn Pulley scaption x184m cues for setup L GHA[facility]ension [facility] isometrics neutral and each end range x5 each, cues for setup, positioning and force ouptut L GH IR isometrics neutral and  each end range x5 each cues for setup/positioning High>low supinated rows blue band 3x8 cues for form/pacing and setup Scapular rolls fwd/back x12 each  Scaption w/ strap for scapular dissociation 2x8 L GH only Manual Therapy: Seated: STM deltoid, biceps, lateral pecs, rhomboids, levator scap. Trigger point release pec and levator scap   [facility] Adult PT Treatment:                                                DATE: 02/19/22 Therapeutic Exercise: Seated ER AAROM w/ cane 2x10 cues for elbow positioning and reduced compensations, rest breaks Finger ladder 2x5 laps within tolerance, cues for form and reduced compensation at shoulder blade, rest breaks 7# cable column rows 2x12 cues for form and reduced compensation  Manual Therapy: Seated: STM L deltoid, UT, LS, rhomboid. Biceps. Lateral portion of pec major/pec minor.  Supine: L GH passive physiological movement  all planes to pt tolerance, gentle oscillations for reduced muscle guarding. Grade 1-2 GH distraction in neutral and ~25deg abduction      PATIENT EDUCATION: Education details: rationale for interventions, relevant anatomy/physiology Person educated: Patient Education method: Explanation Education comprehension: verbalized understanding and needs further education   HOME EXERCISE PROGRAM: Access Code: [email or web address]: [email or web address] Date: 02/26/2022 Prepared by: DaEnis SlipperProgram Notes for scaption - use belt and towel to keep shoulder blade down  Exercises - Standing Shoulder Scaption  - 1 x daily - 7 x weekly - 2 sets - 8 reps - Standing Shoulder External Rotation AAROM with Dowel  - 1 x daily - 7 x weekly - 2 sets - 8 reps   ASSESSMENT:   CLINICAL IMPRESSION: Pt responded well to all interventions today, demonstrating good form and no increase in pain with selected exercises. Of note, she reports a therapeutic response to vasopneumatic treatment. She will continue to benefit from skilled PT to  address her primary impairments and return to her prior level of function with less limitation.      OBJECTIVE IMPAIRMENTS: decreased activity tolerance, decreased knowledge of condition, decreased mobility, decreased ROM, decreased strength, impaired UE functional use, and pain.    ACTIVITY LIMITATIONS: carrying, lifting, sleeping, bed mobility, and reach over head   PERSONAL FACTORS: Time since onset of injury/illness/exacerbation and 1 comorbidity: DM  are also affecting patient's functional outcome.       GOALS: Goals reviewed with patient? No   SHORT TERM GOALS: Target date: 02/07/22   Patient to demonstrate independence in HEP  Baseline:[id number] Goal status: MET   2.  Increase AROM to 120d flexion and abduction Baseline:  A/PROM Right eval Left eval  Shoulder flexion   95/125d  Shoulder extension      Shoulder abduction   70/90d  Shoulder adduction      Shoulder internal rotation Tip of scapula L SI joint/70d  Shoulder external rotation T3 C7/30d    Goal status: INITIAL   3.  Decrease pain at night to 6/10 Baseline: 8/10 Goal status: INITIAL       LONG TERM GOALS: Target date: 03/02/22   Increase AROM to 150d flexion and abduction Baseline:  A/PROM Right eval Left  eval  Shoulder flexion   95/125d  Shoulder extension      Shoulder abduction   70/90d  Shoulder adduction      Shoulder internal rotation Tip of scapula L SI joint/70d  Shoulder external rotation T3 C7/30d    Goal status: INITIAL   2.  Increase L shoulder strength to 4/5 in deficit areas Baseline:  MMT Right eval Left eval  Shoulder flexion   3+  Shoulder extension   4+  Shoulder abduction   3+  Shoulder adduction      Shoulder internal rotation   4+  Shoulder external rotation   3+    Goal status: INITIAL   3.  Increase FOTO score to 64 Baseline: 54 Goal status: INITIAL   4.  Decrease worst pain to 4/10 Baseline: 8/10 Goal status: INITIAL       PLAN:   PT FREQUENCY:  2x/week   PT DURATION: 6 weeks   PLANNED INTERVENTIONS: Therapeutic exercises, Therapeutic activity, Neuromuscular re-education, Balance training, Gait training, Patient/Family education, Self Care, Joint mobilization, Joint manipulation, Dry Needling, Manual therapy, and Re-evaluation   PLAN FOR NEXT SESSION:    HEP review. Manual as indicated. Continue isometrics/stretching as able/appropriate. Could benefit from more emphasis on scapular dissociation from [facility] movement   Vanessa [place], PT, DPT 02/28/22 1:03 PM

## 2022-03-07 ENCOUNTER — Ambulatory Visit: Payer: 59 | Attending: Family Medicine

## 2022-03-07 DIAGNOSIS — M6281 Muscle weakness (generalized): Secondary | ICD-10-CM | POA: Insufficient documentation

## 2022-03-07 DIAGNOSIS — M7502 Adhesive capsulitis of left shoulder: Secondary | ICD-10-CM

## 2022-03-07 DIAGNOSIS — M542 Cervicalgia: Secondary | ICD-10-CM | POA: Diagnosis present

## 2022-03-07 DIAGNOSIS — R293 Abnormal posture: Secondary | ICD-10-CM | POA: Diagnosis present

## 2022-03-07 DIAGNOSIS — M25512 Pain in left shoulder: Secondary | ICD-10-CM | POA: Diagnosis present

## 2022-03-07 NOTE — Therapy (Signed)
OUTPATIENT PHYSICAL THERAPY TREATMENT NOTE   Patient Name: [name] MRN: [id number] DOB:[birth year], 57 y.o., female Today's Date: 03/07/2022  PCP: Haydee Salter, MD    REFERRING PROVIDER: Faythe Casa, PA   END OF SESSION:   PT End of Session - 03/07/22 1350     Visit Number 8    Number of Visits 12    Date for PT Re-Evaluation 03/14/22    Authorization Type [employer]    Authorization - Visit Number 1    Authorization - Number of Visits 20    PT Start Time 3220    PT Stop Time 2542    PT Time Calculation (min) 38 min    Activity Tolerance Patient tolerated treatment well;No increased pain    Behavior During Therapy WFL for tasks assessed/performed                  Past Medical History:  Diagnosis Date   Abnormal uterine bleeding (AUB)    Anxiety    Arthritis    Diabetes mellitus    Elevated cholesterol    Endometrial polyp    Endometrial polyp    Endometriosis    Frozen shoulder    Hypertension    Migraines    Nodule    Ovarian cyst    Past Surgical History:  Procedure Laterality Date   COMBINED HYSTEROSCOPY DIAGNOSTIC / D&C  yrs ago   [place]   with laser adhesions   DILITATION & CURRETTAGE/HYSTROSCOPY WITH NOVASURE ABLATION N/A 11/04/2019   Procedure: DILATATION & CURETTAGE/HYSTEROSCOPY WITH NOVASURE ABLATION;  Surgeon: Joseph Pierini, MD;  Location: [facility];  Service: Gynecology;  Laterality: N/A;   INTRAUTERINE DEVICE INSERTION     mirena-Inserted 05-16-14   KNEE SURGERY Left yrs ago   meniscurs tear repair   mirena removed  2021   OOPHORECTOMY  2008   left   ROTATOR CUFF REPAIR Right yrs ago   WRIST SURGERY Right    gang. cyst   Patient Active Problem List   Diagnosis Date Noted   Adhesive capsulitis of left shoulder 01/31/2022   Depression with anxiety 09/27/2020   Migraine headache 09/27/2020   Insomnia 09/27/2020   Thyroid nodule 09/27/2020   Class 2 obesity due to excess  calories with body mass index (BMI) of 37.0 to 37.9 in adult 03/09/2019   Acute right-sided low back pain with right-sided sciatica 06/10/2017   Trochanteric bursitis, right hip 05/27/2017   Atypical chest pain 11/18/2012   Type 2 diabetes mellitus ([place]) 11/18/2012   Essential hypertension 11/18/2012   Hyperlipidemia     REFERRING DIAG: M75.02 (ICD-10-CM) - Adhesive bursitis of left shoulder    THERAPY DIAG:  Adhesive capsulitis of left shoulder  Acute pain of left shoulder  Rationale for Evaluation and Treatment Rehabilitation  PERTINENT HISTORY: 2-3 month hx of L shoulder pain, injection 2 weeks ago provided some relief as well as an increase in AROM. Has difficulty reaching OH and B/H back as well as sleep positions.  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 She has a follow-up with her doctor on 1/9.    PAIN:  Are you having pain? Yes, 2/10 Pain location: L shoulder Pain description: ache Aggravating factors: AROM Relieving factors: rest   OBJECTIVE: (objective measures completed at initial evaluation unless otherwise dated)   DIAGNOSTIC FINDINGS:  None available   PATIENT SURVEYS:  FOTO 54(64 predicted) 02/26/22: 60 FOTO    COGNITION: Overall cognitive status: Within functional limits for tasks assessed                                  SENSATION: Not tested   POSTURE: Slightly rounded and depressed L shoulder   UPPER EXTREMITY ROM:    A/PROM Right eval Left eval Left AROM 02/07/22  Shoulder flexion   95/125d 112 p!  Shoulder extension       Shoulder abduction   70/90d   Shoulder adduction        Shoulder internal rotation Tip of scapula L SI joint/70d   Shoulder external rotation T3 C7/30d   Elbow flexion       Elbow extension       Wrist flexion       Wrist extension       Wrist ulnar deviation       Wrist radial deviation       Wrist pronation       Wrist supination       (Blank rows = not tested)   UPPER EXTREMITY MMT:   MMT Right eval Left eval  Shoulder flexion   3+  Shoulder extension   4+  Shoulder abduction   3+  Shoulder adduction      Shoulder internal rotation   4+  Shoulder external rotation   3+  Middle trapezius      Lower trapezius      Elbow flexion      Elbow extension      Wrist flexion      Wrist extension      Wrist ulnar deviation      Wrist radial deviation      Wrist pronation      Wrist supination      Grip strength (lbs)      (Blank rows = not tested)   SHOULDER SPECIAL TESTS: N/A   JOINT MOBILITY TESTING:  deferred   PALPATION:  TTP distal tip of acromion/AC joint             TODAY'S TREATMENT:    [facility] Adult PT Treatment:                                                DATE: 03/07/2022 Therapeutic Exercise: UBE 35mn forward, 217m backward while collecting subjective information Bent-over Lt shoulder pendulum 2x30sec Standing Lt shoulder scaption AAROM with UE Ranger 2x10 with 5-sec hold at end-range Standing BIL shoulder AAROM flexion with black physioball at wall with perturbations at end range 3x30 sec Standing "V" lifts with black physioball 2x10 Standing Lt shoulder ER isometric walkout with concentric contraction at end of walkout 2x8 Manual Therapy: N/A Neuromuscular re-ed: N/A Therapeutic Activity: N/A Modalities: N/A Self Care: N/A   [facility]  [facility] Therapeutic Exercise: Standing Lt UE ranger shoulder flexion AAROM 2x10 with 5-sec hold at end range Bent-over Lt shoulder pendulums 2x30sec Seated Lt shoulder ER AAROM with dowel 2x10  with 5-sec hold Seated Lt shoulder IR towel stretch x2 minutes Manual Therapy: Supine GHJ grade 3 mobilizations x110mn AP, PA, and inf Supine Lt UE manual distraction with oscillations x340m Neuromuscular re-ed: N/A Therapeutic Activity: N/A Modalities: Seated Lt shoulder GameReady vasopneumatic treatment x10 min without adverse response Self Care: N/A         [facility] Adult PT Treatment:                                                DATE: 02/26/22 Therapeutic Exercise: Pulley flex x1m61mPulley scaption x1mi64mues for setup L GH ER isometrics neutral and each end range x5 each, cues for setup, positioning and force ouptut L GH IR isometrics neutral and each end range x5 each cues for setup/positioning High>low supinated rows blue band 3x8 cues for form/pacing and setup Scapular rolls fwd/back x12 each  Scaption w/ strap for scapular dissociation 2x8 L GH only Manual Therapy: Seated: STM deltoid, biceps, lateral pecs, rhomboids, levator scap. Trigger point release pec and levator scap      PATIENT EDUCATION: Education details: rationale for interventions, relevant anatomy/physiology Person educated: Patient Education method: Explanation Education comprehension: verbalized understanding and needs further education   HOME EXERCISE PROGRAM: Access Code: [email or web address]: [email or web address] Date: 02/26/2022 Prepared by: DaviEnis Slipperogram Notes for scaption - use belt and towel to keep shoulder blade down  Exercises - Standing Shoulder Scaption  - 1 x daily - 7 x weekly - 2 sets - 8 reps - Standing Shoulder External Rotation AAROM with Dowel  - 1 x daily - 7 x weekly - 2 sets - 8 reps   ASSESSMENT:   CLINICAL IMPRESSION: Pt responded well to all interventions today, demonstrating good form and no increase in pain with completed exercises. She will continue to benefit from skilled PT to address her primary impairments and return to her prior level of function  with less limitation.      OBJECTIVE IMPAIRMENTS: decreased activity tolerance, decreased knowledge of condition, decreased mobility, decreased ROM, decreased strength, impaired UE functional use, and pain.    ACTIVITY LIMITATIONS: carrying, lifting, sleeping, bed mobility, and reach over head   PERSONAL FACTORS: Time since onset of injury/illness/exacerbation and 1 comorbidity: DM  are also affecting patient's functional outcome.       GOALS: Goals reviewed with patient? No   SHORT TERM GOALS: Target date: 02/07/22   Patient to demonstrate independence in HEP  Baseline:[id number] Goal status: MET   2.  Increase AROM to 120d flexion and abduction Baseline:  A/PROM Right eval Left eval  Shoulder flexion   95/125d  Shoulder extension      Shoulder abduction   70/90d  Shoulder adduction      Shoulder internal rotation Tip of scapula L SI joint/70d  Shoulder external rotation T3 C7/30d    Goal status: INITIAL   3.  Decrease pain at night to 6/10 Baseline: 8/10 Goal status: INITIAL       LONG TERM GOALS: Target date: 03/02/22   Increase AROM to 150d flexion and abduction Baseline:  A/PROM Right eval Left eval  Shoulder flexion   95/125d  Shoulder extension  Shoulder abduction   70/90d  Shoulder adduction      Shoulder internal rotation Tip of scapula L SI joint/70d  Shoulder external rotation T3 C7/30d    Goal status: INITIAL   2.  Increase L shoulder strength to 4/5 in deficit areas Baseline:  MMT Right eval Left eval  Shoulder flexion   3+  Shoulder extension   4+  Shoulder abduction   3+  Shoulder adduction      Shoulder internal rotation   4+  Shoulder external rotation   3+    Goal status: INITIAL   3.  Increase FOTO score to 64 Baseline: 54 Goal status: INITIAL   4.  Decrease worst pain to 4/10 Baseline: 8/10 Goal status: INITIAL       PLAN:   PT FREQUENCY: 2x/week   PT DURATION: 6 weeks   PLANNED INTERVENTIONS: Therapeutic  exercises, Therapeutic activity, Neuromuscular re-education, Balance training, Gait training, Patient/Family education, Self Care, Joint mobilization, Joint manipulation, Dry Needling, Manual therapy, and Re-evaluation   PLAN FOR NEXT SESSION:    HEP review. Manual as indicated. Continue isometrics/stretching as able/appropriate. Could benefit from more emphasis on scapular dissociation from [facility] movement   Vanessa Chaves, PT, DPT 03/07/22 2:29 PM

## 2022-03-08 ENCOUNTER — Other Ambulatory Visit: Payer: Self-pay | Admitting: Family Medicine

## 2022-03-14 ENCOUNTER — Ambulatory Visit: Payer: 59

## 2022-03-14 DIAGNOSIS — M7502 Adhesive capsulitis of left shoulder: Secondary | ICD-10-CM

## 2022-03-14 DIAGNOSIS — M25512 Pain in left shoulder: Secondary | ICD-10-CM

## 2022-03-14 NOTE — Therapy (Signed)
OUTPATIENT PHYSICAL THERAPY TREATMENT NOTE   Patient Name: [name] MRN: [id number] DOB:1965-12-21, 57 y.o., female Today's Date: 03/14/2022  PCP: Haydee Salter, MD    REFERRING PROVIDER: Faythe Casa, PA   END OF SESSION:   PT End of Session - 03/14/22 1311     Visit Number 9    Number of Visits 15    Date for PT Re-Evaluation 05/02/22    Authorization Type [employer]    Authorization - Visit Number 2    Authorization - Number of Visits 20    PT Start Time 1300    PT Stop Time 1340    PT Time Calculation (min) 40 min    Activity Tolerance Patient tolerated treatment well    Behavior During Therapy WFL for tasks assessed/performed                   Past Medical History:  Diagnosis Date   Abnormal uterine bleeding (AUB)    Anxiety    Arthritis    Diabetes mellitus    Elevated cholesterol    Endometrial polyp    Endometrial polyp    Endometriosis    Frozen shoulder    Hypertension    Migraines    Nodule    Ovarian cyst    Past Surgical History:  Procedure Laterality Date   COMBINED HYSTEROSCOPY DIAGNOSTIC / D&C  yrs ago   [place]   with laser adhesions   DILITATION & CURRETTAGE/HYSTROSCOPY WITH NOVASURE ABLATION N/A 11/04/2019   Procedure: DILATATION & CURETTAGE/HYSTEROSCOPY WITH NOVASURE ABLATION;  Surgeon: Joseph Pierini, MD;  Location: [facility];  Service: Gynecology;  Laterality: N/A;   INTRAUTERINE DEVICE INSERTION     mirena-Inserted 05-16-14   KNEE SURGERY Left yrs ago   meniscurs tear repair   mirena removed  2021   OOPHORECTOMY  2008   left   ROTATOR CUFF REPAIR Right yrs ago   WRIST SURGERY Right    gang. cyst   Patient Active Problem List   Diagnosis Date Noted   Adhesive capsulitis of left shoulder 01/31/2022   Depression with anxiety 09/27/2020   Migraine headache 09/27/2020   Insomnia 09/27/2020   Thyroid nodule 09/27/2020   Class 2 obesity due to excess calories with body  mass index (BMI) of 37.0 to 37.9 in adult 03/09/2019   Acute right-sided low back pain with right-sided sciatica 06/10/2017   Trochanteric bursitis, right hip 05/27/2017   Atypical chest pain 11/18/2012   Type 2 diabetes mellitus ([place]) 11/18/2012   Essential hypertension 11/18/2012   Hyperlipidemia     REFERRING DIAG: M75.02 (ICD-10-CM) - Adhesive bursitis of left shoulder    THERAPY DIAG:  Adhesive capsulitis of left shoulder - Plan: PT plan of care cert/re-cert  Acute pain of left shoulder - Plan: PT plan of care cert/re-cert  Rationale for Evaluation and Treatment Rehabilitation  PERTINENT HISTORY: 2-3 month hx of L shoulder pain, injection 2 weeks ago provided some relief as well as an increase in AROM. Has difficulty reaching OH and B/H back as well as sleep positions.  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 She reports she also has noticed that her Rt shoulder feels like it pops when she moves it overhead. This started a couple of weeks ago not associated with any particular activity. She reports HEP adherence. Pt's follow-up with her doctor was rescheduled for next Tuesday.     PAIN:  Are you having pain? Yes, 2/10 Pain location: L shoulder Pain description: ache Aggravating factors: AROM Relieving factors: rest   OBJECTIVE: (objective measures completed at initial evaluation unless otherwise dated)   DIAGNOSTIC FINDINGS:  None available   PATIENT SURVEYS:  FOTO 54(64 predicted) 02/26/22: 57 FOTO    COGNITION: Overall cognitive status: Within functional limits for tasks assessed                                  SENSATION: Not tested    POSTURE: Slightly rounded and depressed L shoulder   UPPER EXTREMITY ROM:    A/PROM Right eval Left eval Left AROM 02/07/22 Left A/PROM 03/14/2022  Shoulder flexion   95/125d 112 p! 130p!/ 136p!/ 139 following MET  Shoulder extension        Shoulder abduction   70/90d  95p!/108p!  Shoulder adduction        Shoulder internal rotation Tip of scapula L SI joint/70d    Shoulder external rotation T3 C7/30d    Elbow flexion        Elbow extension        Wrist flexion        Wrist extension        Wrist ulnar deviation        Wrist radial deviation        Wrist pronation        Wrist supination        (Blank rows = not tested)   UPPER EXTREMITY MMT:   MMT Right eval Left eval  Shoulder flexion   3+  Shoulder extension   4+  Shoulder abduction   3+  Shoulder adduction      Shoulder internal rotation   4+  Shoulder external rotation   3+  Middle trapezius      Lower trapezius      Elbow flexion      Elbow extension      Wrist flexion      Wrist extension      Wrist ulnar deviation      Wrist radial deviation      Wrist pronation      Wrist supination      Grip strength (lbs)      (Blank rows = not tested)   SHOULDER SPECIAL TESTS: N/A   JOINT MOBILITY TESTING:  deferred   PALPATION:  TTP distal tip of acromion/AC joint             TODAY'S TREATMENT:    [facility] Adult PT Treatment:                                                DATE: 03/14/2022 Therapeutic Exercise: Standing Lt shoulder scaption AAROM with UE ranger 2x10 with 5-sec hold Standing arm circle angels 2x5 Standing Lt shoulder IR towel stretch 3x30 seconds Standing rows with blue band 2x10 Manual Therapy: Rt sidelying  Lt scapular protraction/ flexion and retraction/ extension contract/ relax MET x6 with 5-sec holds Rt sidelying Lt parascapular STM x4 minutes Neuromuscular re-ed: N/A Therapeutic Activity: Re-assessment of objective measures with update to goals with pt education Update to HEP with  pt education Modalities: N/A Self Care: N/A   [facility] Adult PT Treatment:                                                DATE: 03/07/2022 Therapeutic Exercise: UBE 74mn forward, 237m backward while collecting subjective information Bent-over Lt shoulder pendulum 2x30sec Standing Lt shoulder scaption AAROM with UE Ranger 2x10 with 5-sec hold at end-range Standing BIL shoulder AAROM flexion with black physioball at wall with perturbations at end range 3x30 sec Standing "V" lifts with black physioball 2x10 Standing Lt shoulder ER isometric walkout with concentric contraction at end of walkout 2x8 Manual Therapy: N/A Neuromuscular re-ed: N/A Therapeutic Activity: N/A Modalities: N/A Self Care: N/A   [facility] Adult PT Treatment:                                                DATE: 02/28/2022 Therapeutic Exercise: Standing Lt UE ranger shoulder flexion AAROM 2x10 with 5-sec hold at end range Bent-over Lt shoulder pendulums 2x30sec Seated Lt shoulder ER AAROM with dowel 2x10 with 5-sec hold Seated Lt shoulder IR towel stretch x2 minutes Manual Therapy: Supine GHJ grade 3 mobilizations x3m20mAP, PA, and inf Supine Lt UE manual distraction with oscillations x3mi35meuromuscular re-ed: N/A Therapeutic Activity: N/A Modalities: Seated Lt shoulder GameReady vasopneumatic treatment x10 min without adverse response Self Care: N/A      PATIENT EDUCATION: Education details: rationale for interventions, relevant anatomy/physiology Person educated: Patient Education method: Explanation Education comprehension: verbalized understanding and needs further education   HOME EXERCISE PROGRAM: Access Code: [email or web address]: [email or web address] Date: 03/14/2022 Prepared by: TuckVanessa Durhamogram Notes for scaption - use belt and towel to keep shoulder blade down  Exercises - Standing Shoulder Scaption  - 1 x daily - 7 x weekly - 2 sets - 8 reps - Standing Shoulder  External Rotation AAROM with Dowel  - 1 x daily - 7 x weekly - 2 sets - 8 reps - Shoulder Scaption AAROM with Dowel (Mirrored)  - 1 x daily - 7 x weekly - 2 sets - 10 reps - 5 seconds hold - Standing Shoulder Internal Rotation Stretch with Towel (Mirrored)  - 1 x daily - 7 x weekly - 3 sets - 10 reps - Standing Shoulder Row with Anchored Resistance  - 1 x daily - 7 x weekly - 3 sets - 10 reps - 5 seconds hold   ASSESSMENT:   CLINICAL IMPRESSION: Upon re-assessment of objective measures, the pt has made good progress in her Lt shoulder elevation AROM, along with pain levels, although she has not met her functional goals in these areas. She responded well to interventions today. Of note, she demonstrates a 9-degrees in-session improvement of Lt shoulder flexion AROM following newly introduced MET. She will continue to benefit from skilled PT to address her primary impairments and return to her prior level of function with less limitation.     OBJECTIVE IMPAIRMENTS: decreased activity tolerance, decreased knowledge of  condition, decreased mobility, decreased ROM, decreased strength, impaired UE functional use, and pain.    ACTIVITY LIMITATIONS: carrying, lifting, sleeping, bed mobility, and reach over head   PERSONAL FACTORS: Time since onset of injury/illness/exacerbation and 1 comorbidity: DM  are also affecting patient's functional outcome.       GOALS: Goals reviewed with patient? No   SHORT TERM GOALS: Target date: 02/07/22   Patient to demonstrate independence in HEP  Baseline:[id number] Goal status: MET   2.  Increase AROM to 120d flexion and abduction Baseline:  A/PROM Right eval Left eval  Shoulder flexion   95/125d  Shoulder extension      Shoulder abduction   70/90d  Shoulder adduction      Shoulder internal rotation Tip of scapula L SI joint/70d  Shoulder external rotation T3 C7/30d    03/14/2022: 130 flexion, 95 abduction  Goal status: IN PROGRESS   3.  Decrease  pain at night to 6/10 Baseline: 8/10 03/14/2022: 7/10 Goal status: IN PROGRESS       LONG TERM GOALS: Target date: 03/02/22   Increase AROM to 150d flexion and abduction Baseline:  A/PROM Right eval Left eval  Shoulder flexion   95/125d  Shoulder extension      Shoulder abduction   70/90d  Shoulder adduction      Shoulder internal rotation Tip of scapula L SI joint/70d  Shoulder external rotation T3 C7/30d    03/14/2022: 130 flexion, 95 abduction  Goal status: IN PROGRESS   2.  Increase L shoulder strength to 4/5 in deficit areas Baseline:  MMT Right eval Left eval  Shoulder flexion   3+  Shoulder extension   4+  Shoulder abduction   3+  Shoulder adduction      Shoulder internal rotation   4+  Shoulder external rotation   3+    Goal status: INITIAL   3.  Increase FOTO score to 64 Baseline: 54 02/26/2022: 57 Goal status: IN PROGRESS   4.  Decrease worst pain to 4/10 Baseline: 8/10 03/14/2022: 7/10 Goal status: IN PROGRESS       PLAN:   PT FREQUENCY: 1x/week   PT DURATION: 6 weeks   PLANNED INTERVENTIONS: Therapeutic exercises, Therapeutic activity, Neuromuscular re-education, Balance training, Gait training, Patient/Family education, Self Care, Joint mobilization, Joint manipulation, Dry Needling, Manual therapy, and Re-evaluation   PLAN FOR NEXT SESSION:    HEP review. Manual as indicated. Continue isometrics/stretching as able/appropriate. Could benefit from more emphasis on scapular dissociation from [facility] movement   Vanessa Spencer, PT, DPT 03/14/22 1:40 PM

## 2022-03-19 ENCOUNTER — Ambulatory Visit: Payer: 59

## 2022-03-19 DIAGNOSIS — M25512 Pain in left shoulder: Secondary | ICD-10-CM

## 2022-03-19 DIAGNOSIS — M7502 Adhesive capsulitis of left shoulder: Secondary | ICD-10-CM | POA: Diagnosis not present

## 2022-03-19 NOTE — Therapy (Signed)
OUTPATIENT PHYSICAL THERAPY TREATMENT NOTE  Progress Note Reporting Period 01/17/2022 to 03/19/2022   See note below for Objective Data and Assessment of Progress/Goals.      Patient Name: [name] MRN: [id number] DOB:Mar 26, 1965, 57 y.o., female Today's Date: 03/19/2022  PCP: Haydee Salter, MD    REFERRING PROVIDER: Faythe Casa, PA   END OF SESSION:   PT End of Session - 03/19/22 1700     Visit Number 10    Number of Visits 15    Date for PT Re-Evaluation 05/02/22    Authorization Type [employer]    Authorization - Visit Number 3    Authorization - Number of Visits 20    Progress Note Due on Visit 10    PT Start Time 1700    PT Stop Time 1740    PT Time Calculation (min) 40 min    Activity Tolerance Patient tolerated treatment well    Behavior During Therapy WFL for tasks assessed/performed                    Past Medical History:  Diagnosis Date   Abnormal uterine bleeding (AUB)    Anxiety    Arthritis    Diabetes mellitus    Elevated cholesterol    Endometrial polyp    Endometrial polyp    Endometriosis    Frozen shoulder    Hypertension    Migraines    Nodule    Ovarian cyst    Past Surgical History:  Procedure Laterality Date   COMBINED HYSTEROSCOPY DIAGNOSTIC / D&C  yrs ago   [place]   with laser adhesions   DILITATION & CURRETTAGE/HYSTROSCOPY WITH NOVASURE ABLATION N/A 11/04/2019   Procedure: DILATATION & CURETTAGE/HYSTEROSCOPY WITH NOVASURE ABLATION;  Surgeon: Joseph Pierini, MD;  Location: [facility];  Service: Gynecology;  Laterality: N/A;   INTRAUTERINE DEVICE INSERTION     mirena-Inserted 05-16-14   KNEE SURGERY Left yrs ago   meniscurs tear repair   mirena removed  2021   OOPHORECTOMY  2008   left   ROTATOR CUFF REPAIR Right yrs ago   WRIST SURGERY Right    gang. cyst   Patient Active Problem List   Diagnosis Date Noted   Adhesive capsulitis of left shoulder 01/31/2022    Depression with anxiety 09/27/2020   Migraine headache 09/27/2020   Insomnia 09/27/2020   Thyroid nodule 09/27/2020   Class 2 obesity due to excess calories with body mass index (BMI) of 37.0 to 37.9 in adult 03/09/2019   Acute right-sided low back pain with right-sided sciatica 06/10/2017   Trochanteric bursitis, right hip 05/27/2017   Atypical chest pain 11/18/2012   Type 2 diabetes mellitus ([place]) 11/18/2012   Essential hypertension 11/18/2012   Hyperlipidemia     REFERRING DIAG: M75.02 (ICD-10-CM) - Adhesive bursitis of left shoulder    THERAPY DIAG:  Adhesive capsulitis of left shoulder  Acute pain of left shoulder  Rationale for Evaluation and Treatment Rehabilitation  PERTINENT HISTORY: 2-3 month hx of L shoulder pain, injection 2 weeks ago provided some relief as well as an increase in AROM. Has difficulty reaching OH and B/H back as well as sleep positions.  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 She reports that it currently hurts worse than before the shot, although she was instructed that it may take a week for the injection to take effect. She also reports continued HEP adherence.     PAIN:  Are you having pain? Yes, 5/10 Pain location: L shoulder Pain description: ache Aggravating factors: AROM Relieving factors: rest   OBJECTIVE: (objective measures completed at initial evaluation unless otherwise dated)   DIAGNOSTIC FINDINGS:  None available   PATIENT SURVEYS:  FOTO 54(64 predicted) 02/26/22: 57 FOTO  03/19/2022: 60   COGNITION: Overall cognitive status: Within functional limits for tasks assessed                                   SENSATION: Not tested   POSTURE: Slightly rounded and depressed L shoulder   UPPER EXTREMITY ROM:    A/PROM Right eval Left eval Left AROM 02/07/22 Left A/PROM 03/14/2022  Shoulder flexion   95/125d 112 p! 130p!/ 136p!/ 139 following MET  Shoulder extension        Shoulder abduction   70/90d  95p!/108p!  Shoulder adduction        Shoulder internal rotation Tip of scapula L SI joint/70d    Shoulder external rotation T3 C7/30d    Elbow flexion        Elbow extension        Wrist flexion        Wrist extension        Wrist ulnar deviation        Wrist radial deviation        Wrist pronation        Wrist supination        (Blank rows = not tested)   UPPER EXTREMITY MMT:   MMT Right eval Left eval Left 03/19/2022  Shoulder flexion   3+ 4p!  Shoulder extension   4+ 5  Shoulder abduction   3+ 4+, mild p!  Shoulder adduction       Shoulder internal rotation   4+ 5  Shoulder external rotation   3+ 5  Middle trapezius       Lower trapezius       Elbow flexion       Elbow extension       Wrist flexion       Wrist extension       Wrist ulnar deviation       Wrist radial deviation       Wrist pronation       Wrist supination       Grip strength (lbs)       (Blank rows = not tested)   SHOULDER SPECIAL TESTS: N/A   JOINT MOBILITY TESTING:  deferred   PALPATION:  TTP distal tip of acromion/AC joint             TODAY'S TREATMENT:    [facility] Adult PT Treatment:                                                DATE: 03/19/2022 Therapeutic Exercise: Standing BIL shoulder flexion AAROM with ball at wall with PT perturbations at end range 3x30sec Standing alternating "V" lifts with straight arms  with black physioball 2x10 BIL Standing low rows with 10# cables 3x10 Standing arm circle angels 2x5 Manual Therapy: N/A Neuromuscular re-ed: N/A Therapeutic Activity: Re-assessment of objective measures with pt education Re-administration of FOTO with pt  education Modalities: N/A Self Care: N/A   [facility] Adult PT Treatment:                                                DATE: 03/14/2022 Therapeutic Exercise: Standing Lt shoulder scaption AAROM with UE ranger 2x10 with 5-sec hold Standing arm circle angels 2x5 Standing Lt shoulder IR towel stretch 3x30 seconds Standing rows with blue band 2x10 Manual Therapy: Rt sidelying Lt scapular protraction/ flexion and retraction/ extension contract/ relax MET x6 with 5-sec holds Rt sidelying Lt parascapular STM x4 minutes Neuromuscular re-ed: N/A Therapeutic Activity: Re-assessment of objective measures with update to goals with pt education Update to HEP with pt education Modalities: N/A Self Care: N/A   [facility] Adult PT Treatment:                                                DATE: 03/07/2022 Therapeutic Exercise: UBE 58mn forward, 251m backward while collecting subjective information Bent-over Lt shoulder pendulum 2x30sec Standing Lt shoulder scaption AAROM with UE Ranger 2x10 with 5-sec hold at end-range Standing BIL shoulder AAROM flexion with black physioball at wall with perturbations at end range 3x30 sec Standing "V" lifts with black physioball 2x10 Standing Lt shoulder ER isometric walkout with concentric contraction at end of walkout 2x8 Manual Therapy: N/A Neuromuscular re-ed: N/A Therapeutic Activity: N/A Modalities: N/A Self Care: N/A      PATIENT EDUCATION: Education details: rationale for interventions, relevant anatomy/physiology Person educated: Patient Education method: Explanation Education comprehension: verbalized understanding and needs further education   HOME EXERCISE PROGRAM: Access Code: [email or web address]: [email or web address] Date: 03/14/2022 Prepared by: TuVanessa DurhamProgram Notes for scaption - use belt and towel to keep shoulder blade down  Exercises - Standing Shoulder Scaption  - 1 x daily - 7 x weekly - 2 sets - 8 reps -  Standing Shoulder External Rotation AAROM with Dowel  - 1 x daily - 7 x weekly - 2 sets - 8 reps - Shoulder Scaption AAROM with Dowel (Mirrored)  - 1 x daily - 7 x weekly - 2 sets - 10 reps - 5 seconds hold - Standing Shoulder Internal Rotation Stretch with Towel (Mirrored)  - 1 x daily - 7 x weekly - 3 sets - 10 reps - Standing Shoulder Row with Anchored Resistance  - 1 x daily - 7 x weekly - 3 sets - 10 reps - 5 seconds hold   ASSESSMENT:   CLINICAL IMPRESSION: Upon re-assessment of objective measures, pt has made further progress in her shoulder strength and FOTO score. She tolerated exercises well today and will continue to benefit from skilled PT to address her primary impairments and return to her prior level of function with less limitation.     OBJECTIVE IMPAIRMENTS: decreased activity tolerance, decreased knowledge of condition, decreased mobility, decreased ROM, decreased strength, impaired UE functional use, and pain.    ACTIVITY LIMITATIONS: carrying, lifting, sleeping, bed mobility, and reach over head   PERSONAL FACTORS: Time since onset  of injury/illness/exacerbation and 1 comorbidity: DM  are also affecting patient's functional outcome.       GOALS: Goals reviewed with patient? No   SHORT TERM GOALS: Target date: 02/07/22   Patient to demonstrate independence in HEP  Baseline:[id number] Goal status: MET   2.  Increase AROM to 120d flexion and abduction Baseline:  A/PROM Right eval Left eval  Shoulder flexion   95/125d  Shoulder extension      Shoulder abduction   70/90d  Shoulder adduction      Shoulder internal rotation Tip of scapula L SI joint/70d  Shoulder external rotation T3 C7/30d    03/14/2022: 130 flexion, 95 abduction  Goal status: IN PROGRESS   3.  Decrease pain at night to 6/10 Baseline: 8/10 03/14/2022: 7/10 Goal status: IN PROGRESS       LONG TERM GOALS: Target date: 03/02/22   Increase AROM to 150d flexion and abduction Baseline:   A/PROM Right eval Left eval  Shoulder flexion   95/125d  Shoulder extension      Shoulder abduction   70/90d  Shoulder adduction      Shoulder internal rotation Tip of scapula L SI joint/70d  Shoulder external rotation T3 C7/30d    03/14/2022: 130 flexion, 95 abduction  Goal status: IN PROGRESS   2.  Increase L shoulder strength to 4/5 in deficit areas Baseline:  MMT Right eval Left eval  Shoulder flexion   3+  Shoulder extension   4+  Shoulder abduction   3+  Shoulder adduction      Shoulder internal rotation   4+  Shoulder external rotation   3+    03/19/2022:  MMT Right eval Left eval Left 03/19/2022  Shoulder flexion   3+ 4p!  Shoulder extension   4+ 5  Shoulder abduction   3+ 4+, mild p!  Shoulder adduction       Shoulder internal rotation   4+ 5  Shoulder external rotation   3+ 5   Goal status: ACHIEVED   3.  Increase FOTO score to 64 Baseline: 54 02/26/2022: 57 03/19/2022: 60 Goal status: IN PROGRESS   4.  Decrease worst pain to 4/10 Baseline: 8/10 03/14/2022: 7/10 Goal status: IN PROGRESS       PLAN:   PT FREQUENCY: 1x/week   PT DURATION: 6 weeks   PLANNED INTERVENTIONS: Therapeutic exercises, Therapeutic activity, Neuromuscular re-education, Balance training, Gait training, Patient/Family education, Self Care, Joint mobilization, Joint manipulation, Dry Needling, Manual therapy, and Re-evaluation   PLAN FOR NEXT SESSION:    HEP review. Manual as indicated. Continue isometrics/stretching as able/appropriate. Could benefit from more emphasis on scapular dissociation from [facility] movement   Vanessa Cecilia, PT, DPT 03/19/22 5:43 PM

## 2022-03-28 ENCOUNTER — Ambulatory Visit: Payer: 59

## 2022-03-28 DIAGNOSIS — M7502 Adhesive capsulitis of left shoulder: Secondary | ICD-10-CM | POA: Diagnosis not present

## 2022-03-28 DIAGNOSIS — M25512 Pain in left shoulder: Secondary | ICD-10-CM

## 2022-03-28 DIAGNOSIS — R293 Abnormal posture: Secondary | ICD-10-CM

## 2022-03-28 DIAGNOSIS — M542 Cervicalgia: Secondary | ICD-10-CM

## 2022-03-28 DIAGNOSIS — M6281 Muscle weakness (generalized): Secondary | ICD-10-CM

## 2022-03-28 NOTE — Therapy (Signed)
OUTPATIENT PHYSICAL THERAPY TREATMENT NOTE    Patient Name: [name] MRN: [id number] DOB:06-03-1965, 57 y.o., female Today's Date: 03/28/2022  PCP: Haydee Salter, MD    REFERRING PROVIDER: Jonelle Sidle D, PA   END OF SESSION:   PT End of Session - 03/28/22 1300     Visit Number 11    Number of Visits 15    Date for PT Re-Evaluation 05/02/22    Authorization Type [employer]    Authorization - Visit Number 4    Authorization - Number of Visits 20    Progress Note Due on Visit 20    PT Start Time 1300    PT Stop Time 1340    PT Time Calculation (min) 40 min    Activity Tolerance Patient tolerated treatment well    Behavior During Therapy WFL for tasks assessed/performed                     Past Medical History:  Diagnosis Date   Abnormal uterine bleeding (AUB)    Anxiety    Arthritis    Diabetes mellitus    Elevated cholesterol    Endometrial polyp    Endometrial polyp    Endometriosis    Frozen shoulder    Hypertension    Migraines    Nodule    Ovarian cyst    Past Surgical History:  Procedure Laterality Date   COMBINED HYSTEROSCOPY DIAGNOSTIC / D&C  yrs ago   [place]   with laser adhesions   DILITATION & CURRETTAGE/HYSTROSCOPY WITH NOVASURE ABLATION N/A 11/04/2019   Procedure: DILATATION & CURETTAGE/HYSTEROSCOPY WITH NOVASURE ABLATION;  Surgeon: Joseph Pierini, MD;  Location: [place];  Service: Gynecology;  Laterality: N/A;   INTRAUTERINE DEVICE INSERTION     mirena-Inserted 05-16-14   KNEE SURGERY Left yrs ago   meniscurs tear repair   mirena removed  2021   OOPHORECTOMY  2008   left   ROTATOR CUFF REPAIR Right yrs ago   WRIST SURGERY Right    gang. cyst   Patient Active Problem List   Diagnosis Date Noted   Adhesive capsulitis of left shoulder 01/31/2022   Depression with anxiety 09/27/2020   Migraine headache 09/27/2020   Insomnia 09/27/2020   Thyroid nodule 09/27/2020   Class 2  obesity due to excess calories with body mass index (BMI) of 37.0 to 37.9 in adult 03/09/2019   Acute right-sided low back pain with right-sided sciatica 06/10/2017   Trochanteric bursitis, right hip 05/27/2017   Atypical chest pain 11/18/2012   Type 2 diabetes mellitus ([place]) 11/18/2012   Essential hypertension 11/18/2012   Hyperlipidemia     REFERRING DIAG: M75.02 (ICD-10-CM) - Adhesive bursitis of left shoulder    THERAPY DIAG:  Adhesive capsulitis of left shoulder  Acute pain of left shoulder  Cervicalgia  Abnormal posture  Muscle weakness (generalized)  Rationale for Evaluation and Treatment Rehabilitation  PERTINENT HISTORY: 2-3 month hx of L shoulder pain, injection 2 weeks ago provided some relief as well as an increase in AROM. Has difficulty reaching OH and B/H back as well as sleep positions.  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 She reports continued HEP adherence.     PAIN:  Are you having pain? Yes, 1/10 Pain location: L shoulder Pain description: ache Aggravating factors: AROM Relieving factors: rest   OBJECTIVE: (objective measures completed at initial evaluation unless otherwise dated)   DIAGNOSTIC FINDINGS:  None available   PATIENT SURVEYS:  FOTO 54(64 predicted) 02/26/22: 57 FOTO  03/19/2022: 60   COGNITION: Overall cognitive status: Within functional limits for tasks assessed                                  SENSATION: Not tested   POSTURE: Slightly rounded and depressed L shoulder   UPPER EXTREMITY ROM:    A/PROM Right eval Left eval Left  AROM 02/07/22 Left A/PROM 03/14/2022  Shoulder flexion   95/125d 112 p! 130p!/ 136p!/ 139 following MET  Shoulder extension        Shoulder abduction   70/90d  95p!/108p!  Shoulder adduction        Shoulder internal rotation Tip of scapula L SI joint/70d    Shoulder external rotation T3 C7/30d    Elbow flexion        Elbow extension        Wrist flexion        Wrist extension        Wrist ulnar deviation        Wrist radial deviation        Wrist pronation        Wrist supination        (Blank rows = not tested)   UPPER EXTREMITY MMT:   MMT Right eval Left eval Left 03/19/2022  Shoulder flexion   3+ 4p!  Shoulder extension   4+ 5  Shoulder abduction   3+ 4+, mild p!  Shoulder adduction       Shoulder internal rotation   4+ 5  Shoulder external rotation   3+ 5  Middle trapezius       Lower trapezius       Elbow flexion       Elbow extension       Wrist flexion       Wrist extension       Wrist ulnar deviation       Wrist radial deviation       Wrist pronation       Wrist supination       Grip strength (lbs)       (Blank rows = not tested)   SHOULDER SPECIAL TESTS: N/A   JOINT MOBILITY TESTING:  deferred   PALPATION:  TTP distal tip of acromion/AC joint             TODAY'S TREATMENT:    [facility] Adult PT Treatment:                                                DATE: 03/28/2022 Therapeutic Exercise: Supine BIL scaption with 4# dumbbells with 3-sec hold at end range with gravity assistance 3x10 Supine chin tuck isometric into pillow with scapular retraction into table 3x10 Standing BIL shoulder flexion AAROM with ball at  wall with PT perturbations at end range 3x30sec Standing alternating "V" lifts with straight arms with black physioball 2x10 BIL Standing Lt shoulder scaption AAROM with UE ranger 2x10 with short-range horizontal adduction/ abduction at end-range Standing arm circle angels 2x5 Manual Therapy: Supine Lt shoulder flexion PROM with light distraction/  oscillation x4 minutes Supine grade 3 GHJ in all planes x4 minutes Neuromuscular re-ed: N/A Therapeutic Activity: N/A Modalities: N/A Self Care: N/A   [facility] Adult PT Treatment:                                                DATE: 03/19/2022 Therapeutic Exercise: Standing BIL shoulder flexion AAROM with ball at wall with PT perturbations at end range 3x30sec Standing alternating "V" lifts with straight arms with black physioball 2x10 BIL Standing low rows with 10# cables 3x10 Standing arm circle angels 2x5 Manual Therapy: N/A Neuromuscular re-ed: N/A Therapeutic Activity: Re-assessment of objective measures with pt education Re-administration of FOTO with pt education Modalities: N/A Self Care: N/A   [facility] Adult PT Treatment:                                                DATE: 03/14/2022 Therapeutic Exercise: Standing Lt shoulder scaption AAROM with UE ranger 2x10 with 5-sec hold Standing arm circle angels 2x5 Standing Lt shoulder IR towel stretch 3x30 seconds Standing rows with blue band 2x10 Manual Therapy: Rt sidelying Lt scapular protraction/ flexion and retraction/ extension contract/ relax MET x6 with 5-sec holds Rt sidelying Lt parascapular STM x4 minutes Neuromuscular re-ed: N/A Therapeutic Activity: Re-assessment of objective measures with update to goals with pt education Update to HEP with pt education Modalities: N/A Self Care: N/A       PATIENT EDUCATION: Education details: rationale for interventions, relevant anatomy/physiology Person educated: Patient Education method: Explanation Education comprehension: verbalized understanding and needs further education   HOME EXERCISE PROGRAM: Access Code: [email or web address] URL: [email or web address] Date: 03/14/2022 Prepared by: Vanessa [place]  Program Notes for scaption - use belt and towel to keep shoulder blade down  Exercises - Standing Shoulder Scaption  - 1 x daily - 7 x weekly - 2  sets - 8 reps - Standing Shoulder External Rotation AAROM with Dowel  - 1 x daily - 7 x weekly - 2 sets - 8 reps - Shoulder Scaption AAROM with Dowel (Mirrored)  - 1 x daily - 7 x weekly - 2 sets - 10 reps - 5 seconds hold - Standing Shoulder Internal Rotation Stretch with Towel (Mirrored)  - 1 x daily - 7 x weekly - 3 sets - 10 reps - Standing Shoulder Row with Anchored Resistance  - 1 x daily - 7 x weekly - 3 sets - 10 reps - 5 seconds hold   ASSESSMENT:   CLINICAL IMPRESSION: Pt continues to progress well with new and progressed exercises, demonstrating good form and no pain throughout the session. PT noticed a roughly 2-inch diameter motile circular ball of tissue about the Lt anterior shoulder. This is painful to the touch. Pt reports this is a new presentation. She was instructed to follow up with her PCP regarding this finding. She will continue to benefit from skilled PT to address her primary impairments and  return to her prior level of function with less limitation.      OBJECTIVE IMPAIRMENTS: decreased activity tolerance, decreased knowledge of condition, decreased mobility, decreased ROM, decreased strength, impaired UE functional use, and pain.    ACTIVITY LIMITATIONS: carrying, lifting, sleeping, bed mobility, and reach over head   PERSONAL FACTORS: Time since onset of injury/illness/exacerbation and 1 comorbidity: DM  are also affecting patient's functional outcome.       GOALS: Goals reviewed with patient? No   SHORT TERM GOALS: Target date: 02/07/22   Patient to demonstrate independence in HEP  Baseline:[id number] Goal status: MET   2.  Increase AROM to 120d flexion and abduction Baseline:  A/PROM Right eval Left eval  Shoulder flexion   95/125d  Shoulder extension      Shoulder abduction   70/90d  Shoulder adduction      Shoulder internal rotation Tip of scapula L SI joint/70d  Shoulder external rotation T3 C7/30d    03/14/2022: 130 flexion, 95  abduction  Goal status: IN PROGRESS   3.  Decrease pain at night to 6/10 Baseline: 8/10 03/14/2022: 7/10 Goal status: IN PROGRESS       LONG TERM GOALS: Target date: 03/02/22   Increase AROM to 150d flexion and abduction Baseline:  A/PROM Right eval Left eval  Shoulder flexion   95/125d  Shoulder extension      Shoulder abduction   70/90d  Shoulder adduction      Shoulder internal rotation Tip of scapula L SI joint/70d  Shoulder external rotation T3 C7/30d    03/14/2022: 130 flexion, 95 abduction  Goal status: IN PROGRESS   2.  Increase L shoulder strength to 4/5 in deficit areas Baseline:  MMT Right eval Left eval  Shoulder flexion   3+  Shoulder extension   4+  Shoulder abduction   3+  Shoulder adduction      Shoulder internal rotation   4+  Shoulder external rotation   3+    03/19/2022:  MMT Right eval Left eval Left 03/19/2022  Shoulder flexion   3+ 4p!  Shoulder extension   4+ 5  Shoulder abduction   3+ 4+, mild p!  Shoulder adduction       Shoulder internal rotation   4+ 5  Shoulder external rotation   3+ 5   Goal status: ACHIEVED   3.  Increase FOTO score to 64 Baseline: 54 02/26/2022: 57 03/19/2022: 60 Goal status: IN PROGRESS   4.  Decrease worst pain to 4/10 Baseline: 8/10 03/14/2022: 7/10 Goal status: IN PROGRESS       PLAN:   PT FREQUENCY: 1x/week   PT DURATION: 6 weeks   PLANNED INTERVENTIONS: Therapeutic exercises, Therapeutic activity, Neuromuscular re-education, Balance training, Gait training, Patient/Family education, Self Care, Joint mobilization, Joint manipulation, Dry Needling, Manual therapy, and Re-evaluation   PLAN FOR NEXT SESSION:    HEP review. Manual as indicated. Continue isometrics/stretching as able/appropriate. Could benefit from more emphasis on scapular dissociation from [facility] movement   Vanessa [place], PT, DPT 03/28/22 1:45 PM

## 2022-04-03 NOTE — Therapy (Incomplete)
OUTPATIENT PHYSICAL THERAPY TREATMENT NOTE    Patient Name: [name] MRN: [id number] DOB:28-Dec-1965, 57 y.o., female Today's Date: 04/03/2022  PCP: Haydee Salter, MD    REFERRING PROVIDER: Jonelle Sidle D, PA   END OF SESSION:             Past Medical History:  Diagnosis Date   Abnormal uterine bleeding (AUB)    Anxiety    Arthritis    Diabetes mellitus    Elevated cholesterol    Endometrial polyp    Endometrial polyp    Endometriosis    Frozen shoulder    Hypertension    Migraines    Nodule    Ovarian cyst    Past Surgical History:  Procedure Laterality Date   COMBINED HYSTEROSCOPY DIAGNOSTIC / D&C  yrs ago   [place]   with laser adhesions   DILITATION & CURRETTAGE/HYSTROSCOPY WITH NOVASURE ABLATION N/A 11/04/2019   Procedure: DILATATION & CURETTAGE/HYSTEROSCOPY WITH NOVASURE ABLATION;  Surgeon: Joseph Pierini, MD;  Location: [facility];  Service: Gynecology;  Laterality: N/A;   INTRAUTERINE DEVICE INSERTION     mirena-Inserted 05-16-14   KNEE SURGERY Left yrs ago   meniscurs tear repair   mirena removed  2021   OOPHORECTOMY  2008   left   ROTATOR CUFF REPAIR Right yrs ago   WRIST SURGERY Right    gang. cyst   Patient Active Problem List   Diagnosis Date Noted   Adhesive capsulitis of left shoulder 01/31/2022   Depression with anxiety 09/27/2020   Migraine headache 09/27/2020   Insomnia 09/27/2020   Thyroid nodule 09/27/2020   Class 2 obesity due to excess calories with body mass index (BMI) of 37.0 to 37.9 in adult 03/09/2019   Acute right-sided low back pain with right-sided sciatica 06/10/2017   Trochanteric bursitis, right hip 05/27/2017   Atypical chest pain 11/18/2012   Type 2 diabetes mellitus ([place]) 11/18/2012   Essential hypertension 11/18/2012   Hyperlipidemia     REFERRING DIAG: M75.02 (ICD-10-CM) - Adhesive bursitis of left shoulder    THERAPY DIAG:  No diagnosis  found.  Rationale for Evaluation and Treatment Rehabilitation  PERTINENT HISTORY: 2-3 month hx of L shoulder pain, injection 2 weeks ago provided some relief as well as an increase in AROM. Has difficulty reaching OH and B/H back as well as sleep positions.  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 She reports continued HEP adherence.     PAIN:  Are you having pain? Yes, 1/10 Pain location: L shoulder Pain description: ache Aggravating factors: AROM Relieving factors: rest   OBJECTIVE: (objective measures completed at initial evaluation unless otherwise dated)   DIAGNOSTIC FINDINGS:  None available   PATIENT SURVEYS:  FOTO 54(64 predicted) 02/26/22: 57 FOTO  03/19/2022: 60   COGNITION: Overall cognitive status: Within functional limits for tasks assessed                                  SENSATION: Not tested   POSTURE: Slightly rounded and depressed L shoulder   UPPER EXTREMITY ROM:    A/PROM Right eval Left eval Left AROM 02/07/22 Left A/PROM 03/14/2022  Shoulder flexion   95/125d 112 p! 130p!/ 136p!/ 139 following MET  Shoulder extension        Shoulder abduction   70/90d  95p!/108p!  Shoulder adduction        Shoulder internal rotation Tip of scapula L SI joint/70d    Shoulder external rotation T3 C7/30d    Elbow flexion        Elbow extension        Wrist flexion        Wrist extension        Wrist ulnar deviation        Wrist radial deviation        Wrist pronation        Wrist supination        (Blank rows = not tested)   UPPER EXTREMITY MMT:    MMT Right eval Left eval Left 03/19/2022  Shoulder flexion   3+ 4p!  Shoulder extension   4+ 5  Shoulder abduction   3+ 4+, mild p!  Shoulder adduction       Shoulder internal rotation   4+ 5  Shoulder external rotation   3+ 5  Middle trapezius       Lower trapezius       Elbow flexion       Elbow extension       Wrist flexion       Wrist extension       Wrist ulnar deviation       Wrist radial deviation       Wrist pronation       Wrist supination       Grip strength (lbs)       (Blank rows = not tested)   SHOULDER SPECIAL TESTS: N/A   JOINT MOBILITY TESTING:  deferred   PALPATION:  TTP distal tip of acromion/AC joint             TODAY'S TREATMENT:   [facility] Adult PT Treatment:                                                DATE: 04/04/22 Therapeutic Exercise: *** Manual Therapy: *** Neuromuscular re-ed: *** Therapeutic Activity: *** Modalities: *** Self Care: [facility] Adult PT Treatment:  DATE: 03/28/2022 Therapeutic Exercise: Supine BIL scaption with 4# dumbbells with 3-sec hold at end range with gravity assistance 3x10 Supine chin tuck isometric into pillow with scapular retraction into table 3x10 Standing BIL shoulder flexion AAROM with ball at wall with PT perturbations at end range 3x30sec Standing alternating "V" lifts with straight arms with black physioball 2x10 BIL Standing Lt shoulder scaption AAROM with UE ranger 2x10 with short-range horizontal adduction/ abduction at end-range Standing arm circle angels 2x5 Manual Therapy: Supine Lt shoulder flexion PROM with light distraction/ oscillation x4 minutes Supine grade 3 GHJ in all planes x4 minutes Neuromuscular re-ed: N/A Therapeutic Activity: N/A Modalities: N/A Self Care: N/A   [facility] Adult PT Treatment:                                                DATE: 03/19/2022 Therapeutic Exercise: Standing BIL shoulder flexion AAROM with ball at wall  with PT perturbations at end range 3x30sec Standing alternating "V" lifts with straight arms with black physioball 2x10 BIL Standing low rows with 10# cables 3x10 Standing arm circle angels 2x5 Manual Therapy: N/A Neuromuscular re-ed: N/A Therapeutic Activity: Re-assessment of objective measures with pt education Re-administration of FOTO with pt education Modalities: N/A Self Care: N/A       PATIENT EDUCATION: Education details: rationale for interventions, relevant anatomy/physiology Person educated: Patient Education method: Explanation Education comprehension: verbalized understanding and needs further education   HOME EXERCISE PROGRAM: Access Code: [email or web address] URL: [email or web address] Date: 03/14/2022 Prepared by: Vanessa [place]  Program Notes for scaption - use belt and towel to keep shoulder blade down  Exercises - Standing Shoulder Scaption  - 1 x daily - 7 x weekly - 2 sets - 8 reps - Standing Shoulder External Rotation AAROM with Dowel  - 1 x daily - 7 x weekly - 2 sets - 8 reps - Shoulder Scaption AAROM with Dowel (Mirrored)  - 1 x daily - 7 x weekly - 2 sets - 10 reps - 5 seconds hold - Standing Shoulder Internal Rotation Stretch with Towel (Mirrored)  - 1 x daily - 7 x weekly - 3 sets - 10 reps - Standing Shoulder Row with Anchored Resistance  - 1 x daily - 7 x weekly - 3 sets - 10 reps - 5 seconds hold   ASSESSMENT:   CLINICAL IMPRESSION: ***  *** Pt continues to progress well with new and progressed exercises, demonstrating good form and no pain throughout the session. PT noticed a roughly 2-inch diameter motile circular ball of tissue about the Lt anterior shoulder. This is painful to the touch. Pt reports this is a new presentation. She was instructed to follow up with her PCP regarding this finding. She will continue to benefit from skilled PT to address her primary impairments and return to her prior level of function with less  limitation.      OBJECTIVE IMPAIRMENTS: decreased activity tolerance, decreased knowledge of condition, decreased mobility, decreased ROM, decreased strength, impaired UE functional use, and pain.    ACTIVITY LIMITATIONS: carrying, lifting, sleeping, bed mobility, and reach over head   PERSONAL FACTORS: Time since onset of injury/illness/exacerbation and 1 comorbidity: DM  are also affecting patient's functional outcome.       GOALS: Goals reviewed with patient? No   SHORT TERM GOALS: Target date: 02/07/22   Patient to demonstrate independence in  HEP  Baseline:[id number] Goal status: MET   2.  Increase AROM to 120d flexion and abduction Baseline:  A/PROM Right eval Left eval  Shoulder flexion   95/125d  Shoulder extension      Shoulder abduction   70/90d  Shoulder adduction      Shoulder internal rotation Tip of scapula L SI joint/70d  Shoulder external rotation T3 C7/30d    03/14/2022: 130 flexion, 95 abduction  Goal status: IN PROGRESS   3.  Decrease pain at night to 6/10 Baseline: 8/10 03/14/2022: 7/10 Goal status: IN PROGRESS       LONG TERM GOALS: Target date: 03/02/22   Increase AROM to 150d flexion and abduction Baseline:  A/PROM Right eval Left eval  Shoulder flexion   95/125d  Shoulder extension      Shoulder abduction   70/90d  Shoulder adduction      Shoulder internal rotation Tip of scapula L SI joint/70d  Shoulder external rotation T3 C7/30d    03/14/2022: 130 flexion, 95 abduction  Goal status: IN PROGRESS   2.  Increase L shoulder strength to 4/5 in deficit areas Baseline:  MMT Right eval Left eval  Shoulder flexion   3+  Shoulder extension   4+  Shoulder abduction   3+  Shoulder adduction      Shoulder internal rotation   4+  Shoulder external rotation   3+    03/19/2022:  MMT Right eval Left eval Left 03/19/2022  Shoulder flexion   3+ 4p!  Shoulder extension   4+ 5  Shoulder abduction   3+ 4+, mild p!  Shoulder adduction        Shoulder internal rotation   4+ 5  Shoulder external rotation   3+ 5   Goal status: ACHIEVED   3.  Increase FOTO score to 64 Baseline: 54 02/26/2022: 57 03/19/2022: 60 Goal status: IN PROGRESS   4.  Decrease worst pain to 4/10 Baseline: 8/10 03/14/2022: 7/10 Goal status: IN PROGRESS       PLAN:   PT FREQUENCY: 1x/week   PT DURATION: 6 weeks   PLANNED INTERVENTIONS: Therapeutic exercises, Therapeutic activity, Neuromuscular re-education, Balance training, Gait training, Patient/Family education, Self Care, Joint mobilization, Joint manipulation, Dry Needling, Manual therapy, and Re-evaluation   PLAN FOR NEXT SESSION:   *** HEP review. Manual as indicated. Continue isometrics/stretching as able/appropriate. Could benefit from more emphasis on scapular dissociation from [facility] movement   Leeroy Cha PT, DPT 04/03/2022 4:09 PM

## 2022-04-04 ENCOUNTER — Ambulatory Visit: Payer: Managed Care, Other (non HMO) | Attending: Family Medicine | Admitting: Physical Therapy

## 2022-04-04 ENCOUNTER — Telehealth: Payer: Self-pay | Admitting: Physical Therapy

## 2022-04-04 DIAGNOSIS — M25512 Pain in left shoulder: Secondary | ICD-10-CM | POA: Insufficient documentation

## 2022-04-04 DIAGNOSIS — M7502 Adhesive capsulitis of left shoulder: Secondary | ICD-10-CM | POA: Insufficient documentation

## 2022-04-04 NOTE — Telephone Encounter (Signed)
Called pt re: this morning's missed appt - she answers and states she thought appt was for this afternoon. Rescheduled next Friday's appt to Tuesday (04/08/22), confirmed date/time with patient.

## 2022-04-07 NOTE — Therapy (Signed)
OUTPATIENT PHYSICAL THERAPY TREATMENT NOTE    Patient Name: [name] MRN: [id number] DOB:1965/05/26, 57 y.o., female Today's Date: 04/08/2022  PCP: Haydee Salter, MD    REFERRING PROVIDER: Jonelle Sidle D, PA   END OF SESSION:   PT End of Session - 04/08/22 0843     Visit Number 12    Number of Visits 15    Date for PT Re-Evaluation 05/02/22    Authorization Type [employer]    Authorization - Visit Number 5    Authorization - Number of Visits 20    PT Start Time [phone]    PT Stop Time 0930    PT Time Calculation (min) 46 min    Activity Tolerance Patient tolerated treatment well    Behavior During Therapy WFL for tasks assessed/performed                      Past Medical History:  Diagnosis Date   Abnormal uterine bleeding (AUB)    Anxiety    Arthritis    Diabetes mellitus    Elevated cholesterol    Endometrial polyp    Endometrial polyp    Endometriosis    Frozen shoulder    Hypertension    Migraines    Nodule    Ovarian cyst    Past Surgical History:  Procedure Laterality Date   COMBINED HYSTEROSCOPY DIAGNOSTIC / D&C  yrs ago   [place]   with laser adhesions   DILITATION & CURRETTAGE/HYSTROSCOPY WITH NOVASURE ABLATION N/A 11/04/2019   Procedure: DILATATION & CURETTAGE/HYSTEROSCOPY WITH NOVASURE ABLATION;  Surgeon: Joseph Pierini, MD;  Location: [facility];  Service: Gynecology;  Laterality: N/A;   INTRAUTERINE DEVICE INSERTION     mirena-Inserted 05-16-14   KNEE SURGERY Left yrs ago   meniscurs tear repair   mirena removed  2021   OOPHORECTOMY  2008   left   ROTATOR CUFF REPAIR Right yrs ago   WRIST SURGERY Right    gang. cyst   Patient Active Problem List   Diagnosis Date Noted   Adhesive capsulitis of left shoulder 01/31/2022   Depression with anxiety 09/27/2020   Migraine headache 09/27/2020   Insomnia 09/27/2020   Thyroid nodule 09/27/2020   Class 2 obesity due to excess calories  with body mass index (BMI) of 37.0 to 37.9 in adult 03/09/2019   Acute right-sided low back pain with right-sided sciatica 06/10/2017   Trochanteric bursitis, right hip 05/27/2017   Atypical chest pain 11/18/2012   Type 2 diabetes mellitus ([place]) 11/18/2012   Essential hypertension 11/18/2012   Hyperlipidemia     REFERRING DIAG: M75.02 (ICD-10-CM) - Adhesive bursitis of left shoulder    THERAPY DIAG:  Adhesive capsulitis of left shoulder  Acute pain of left shoulder  Rationale for Evaluation and Treatment Rehabilitation  PERTINENT HISTORY: 2-3 month hx of L shoulder pain, injection 2 weeks ago provided some relief as well as an increase in AROM. Has difficulty reaching OH and B/H back as well as sleep positions.  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 Continues to have most difficulty with lying on shoulder for prolonged periods    PAIN:  Are you having pain? 0/10 Pain location: L shoulder Pain description: ache Aggravating factors: AROM Relieving factors: rest   OBJECTIVE: (objective measures completed at initial evaluation unless otherwise dated)   DIAGNOSTIC FINDINGS:  None available   PATIENT SURVEYS:  FOTO 54(64 predicted) 02/26/22: 57 FOTO  03/19/2022: 60   COGNITION: Overall cognitive status: Within functional limits for tasks assessed                                  SENSATION: Not tested   POSTURE: Slightly rounded and depressed L shoulder   UPPER EXTREMITY ROM:    A/PROM Right eval Left eval Left AROM 02/07/22 Left A/PROM 03/14/2022 L AROM 04/08/22  Shoulder flexion   95/125d 112 p! 130p!/ 136p!/ 139 following MET 144 deg p!  Shoulder extension          Shoulder abduction   70/90d  95p!/108p! 110p! Significant shoulder hike  Shoulder adduction         Shoulder internal rotation Tip of scapula L SI joint/70d     Shoulder external rotation T3 C7/30d     Elbow flexion         Elbow extension         Wrist flexion         Wrist extension         Wrist ulnar deviation         Wrist radial deviation         Wrist pronation         Wrist supination         (Blank rows = not tested)   UPPER EXTREMITY MMT:   MMT Right eval Left eval Left 03/19/2022  Shoulder flexion   3+ 4p!  Shoulder extension   4+ 5  Shoulder abduction   3+ 4+, mild p!  Shoulder adduction       Shoulder internal rotation   4+ 5  Shoulder external rotation   3+ 5  Middle trapezius       Lower trapezius       Elbow flexion       Elbow extension       Wrist flexion       Wrist extension       Wrist ulnar deviation       Wrist radial deviation       Wrist pronation       Wrist supination       Grip strength (lbs)       (Blank rows = not tested)   SHOULDER SPECIAL TESTS: N/A   JOINT MOBILITY TESTING:  deferred   PALPATION:  TTP distal tip of acromion/AC joint             TODAY'S TREATMENT:   [facility] Adult PT Treatment:                                                DATE: 04/07/22 Therapeutic Exercise: Supine RC cuff manual perturbations 2x30sec cues for hold and positioning, shoulder ~90deg Supine dowel flexion 5# 2x8 cues for form, 3 sec hold at end range Sidelying  ER 2x10 LUE cues for form and alignment  Manual Therapy: Supine; passive physiological movement all planes L GHJ, gentle distraction and oscillations to mitigate muscle guarding and maximize ROM   Therapeutic Activity: MSK assessment + education (including ROM and palpation), discussion re: symptom progression and PT POC   [facility] Adult PT Treatment:                                                DATE: 03/28/2022 Therapeutic Exercise: Supine BIL scaption with 4# dumbbells with 3-sec hold  at end range with gravity assistance 3x10 Supine chin tuck isometric into pillow with scapular retraction into table 3x10 Standing BIL shoulder flexion AAROM with ball at wall with PT perturbations at end range 3x30sec Standing alternating "V" lifts with straight arms with black physioball 2x10 BIL Standing Lt shoulder scaption AAROM with UE ranger 2x10 with short-range horizontal adduction/ abduction at end-range Standing arm circle angels 2x5 Manual Therapy: Supine Lt shoulder flexion PROM with light distraction/ oscillation x4 minutes Supine grade 3 GHJ in all planes x4 minutes Neuromuscular re-ed: N/A Therapeutic Activity: N/A Modalities: N/A Self Care: N/A   [facility] Adult PT Treatment:                                                DATE: 03/19/2022 Therapeutic Exercise: Standing BIL shoulder flexion AAROM with ball at wall with PT perturbations at end range 3x30sec Standing alternating "V" lifts with straight arms with black physioball 2x10 BIL Standing low rows with 10# cables 3x10 Standing arm circle angels 2x5 Manual Therapy: N/A Neuromuscular re-ed: N/A Therapeutic Activity: Re-assessment of objective measures with pt education Re-administration of FOTO with pt education Modalities: N/A Self Care: N/A       PATIENT EDUCATION: Education details: rationale for interventions, relevant anatomy/physiology, PT progress and POC, monitoring symptoms and discussion w/ provider Person educated: Patient Education method: Explanation Education comprehension: verbalized understanding and needs further education   HOME EXERCISE PROGRAM: Access Code: [email or web address] URL: [email or web address] Date: 03/14/2022 Prepared by: Vanessa [place]  Program Notes for scaption - use belt and towel to keep shoulder blade down  Exercises - Standing Shoulder Scaption  - 1 x daily - 7 x weekly - 2 sets - 8 reps - Standing Shoulder External Rotation AAROM with Dowel  - 1 x  daily - 7 x weekly - 2 sets - 8 reps - Shoulder Scaption AAROM with Dowel (Mirrored)  - 1 x daily - 7 x weekly - 2 sets - 10 reps - 5 seconds hold - Standing Shoulder Internal Rotation Stretch with Towel (Mirrored)  - 1 x daily - 7 x weekly - 3 sets - 10 reps - Standing Shoulder Row with Anchored Resistance  - 1 x daily - 7 x weekly - 3 sets - 10 reps - 5 seconds hold   ASSESSMENT:   CLINICAL IMPRESSION: Pt arrives w/o pain, primary report of soreness/stiffness. Palpation of L GHJ does reveal findings consistent w/ most recent note (see Abran Duke note from 03/28/22), overall reassuring although is tender to touch, encouraged pt to monitor for changes/red flags and follow up with PCP. Modest improvements in AROM as above, remains limited by pain/stiffness. Time spent discuss symptom progression, progress w/  PT thus far, POC, and follow up with provider. Pt endorses muscular fatigue/soreness as session goes on (rating up to 4/10 although denies overt pain). No adverse events. Pt departs today's session in no acute distress, all voiced questions/concerns addressed appropriately from PT perspective.        OBJECTIVE IMPAIRMENTS: decreased activity tolerance, decreased knowledge of condition, decreased mobility, decreased ROM, decreased strength, impaired UE functional use, and pain.    ACTIVITY LIMITATIONS: carrying, lifting, sleeping, bed mobility, and reach over head   PERSONAL FACTORS: Time since onset of injury/illness/exacerbation and 1 comorbidity: DM  are also affecting patient's functional outcome.       GOALS: Goals reviewed with patient? No   SHORT TERM GOALS: Target date: 02/07/22   Patient to demonstrate independence in HEP  Baseline:[id number] Goal status: MET   2.  Increase AROM to 120d flexion and abduction Baseline:  A/PROM Right eval Left eval  Shoulder flexion   95/125d  Shoulder extension      Shoulder abduction   70/90d  Shoulder adduction      Shoulder  internal rotation Tip of scapula L SI joint/70d  Shoulder external rotation T3 C7/30d    03/14/2022: 130 flexion, 95 abduction  Goal status: IN PROGRESS   3.  Decrease pain at night to 6/10 Baseline: 8/10 03/14/2022: 7/10 Goal status: IN PROGRESS       LONG TERM GOALS: Target date: 03/02/22   Increase AROM to 150d flexion and abduction Baseline:  A/PROM Right eval Left eval  Shoulder flexion   95/125d  Shoulder extension      Shoulder abduction   70/90d  Shoulder adduction      Shoulder internal rotation Tip of scapula L SI joint/70d  Shoulder external rotation T3 C7/30d    03/14/2022: 130 flexion, 95 abduction  Goal status: IN PROGRESS   2.  Increase L shoulder strength to 4/5 in deficit areas Baseline:  MMT Right eval Left eval  Shoulder flexion   3+  Shoulder extension   4+  Shoulder abduction   3+  Shoulder adduction      Shoulder internal rotation   4+  Shoulder external rotation   3+    03/19/2022:  MMT Right eval Left eval Left 03/19/2022  Shoulder flexion   3+ 4p!  Shoulder extension   4+ 5  Shoulder abduction   3+ 4+, mild p!  Shoulder adduction       Shoulder internal rotation   4+ 5  Shoulder external rotation   3+ 5   Goal status: ACHIEVED   3.  Increase FOTO score to 64 Baseline: 54 02/26/2022: 57 03/19/2022: 60 Goal status: IN PROGRESS   4.  Decrease worst pain to 4/10 Baseline: 8/10 03/14/2022: 7/10 Goal status: IN PROGRESS       PLAN:   PT FREQUENCY: 1x/week   PT DURATION: 6 weeks   PLANNED INTERVENTIONS: Therapeutic exercises, Therapeutic activity, Neuromuscular re-education, Balance training, Gait training, Patient/Family education, Self Care, Joint mobilization, Joint manipulation, Dry Needling, Manual therapy, and Re-evaluation   PLAN FOR NEXT SESSION:    HEP review. Manual as indicated. Continue isometrics/stretching as able/appropriate. Could benefit from more emphasis on scapular dissociation from [facility] movement   Leeroy Cha PT, DPT 04/08/2022 9:36 AM

## 2022-04-08 ENCOUNTER — Encounter: Payer: Self-pay | Admitting: Physical Therapy

## 2022-04-08 ENCOUNTER — Encounter: Payer: Self-pay | Admitting: Family Medicine

## 2022-04-08 ENCOUNTER — Ambulatory Visit: Payer: Managed Care, Other (non HMO) | Admitting: Physical Therapy

## 2022-04-08 DIAGNOSIS — M25512 Pain in left shoulder: Secondary | ICD-10-CM | POA: Diagnosis present

## 2022-04-08 DIAGNOSIS — M7502 Adhesive capsulitis of left shoulder: Secondary | ICD-10-CM | POA: Diagnosis present

## 2022-04-10 ENCOUNTER — Ambulatory Visit (INDEPENDENT_AMBULATORY_CARE_PROVIDER_SITE_OTHER): Payer: Managed Care, Other (non HMO) | Admitting: Family Medicine

## 2022-04-10 VITALS — BP 124/70 | HR 86 | Temp 97.6°F | Ht 59.0 in | Wt 180.6 lb

## 2022-04-10 DIAGNOSIS — U071 COVID-19: Secondary | ICD-10-CM | POA: Diagnosis not present

## 2022-04-10 NOTE — Progress Notes (Signed)
[facility] [address] [place] [address] Dept: [phone] Dept Fax: [phone]  Office Visit  Subjective:    Patient ID: [name], female    DOB: 09-Dec-1965, 57 y.o..   MRN: [id number]  Chief Complaint  Patient presents with   Nasal Congestion    C/o having head congestion, ST, HA x 5 days.   Has taken Dayquil, Coricidin.     History of Present Illness:  Patient is in today complaining of a 5-day history of nasal congestion, headache, and sore throat. She denies any fever, loss of taste/smell, poor appetite, or cough. He husband started having some sore throat yesterday. She had not done any home testing related to this.  Past Medical History: Patient Active Problem List   Diagnosis Date Noted   COVID 04/10/2022   Adhesive capsulitis of left shoulder 01/31/2022   Depression with anxiety 09/27/2020   Migraine headache 09/27/2020   Insomnia 09/27/2020   Thyroid nodule 09/27/2020   Class 2 obesity due to excess calories with body mass index (BMI) of 37.0 to 37.9 in adult 03/09/2019   Acute right-sided low back pain with right-sided sciatica 06/10/2017   Trochanteric bursitis, right hip 05/27/2017   Atypical chest pain 11/18/2012   Type 2 diabetes mellitus ([place]) 11/18/2012   Essential hypertension 11/18/2012   Hyperlipidemia    Past Surgical History:  Procedure Laterality Date   COMBINED HYSTEROSCOPY DIAGNOSTIC / D&C  yrs ago   [place]   with laser adhesions   DILITATION & CURRETTAGE/HYSTROSCOPY WITH NOVASURE ABLATION N/A 11/04/2019   Procedure: DILATATION & CURETTAGE/HYSTEROSCOPY WITH NOVASURE ABLATION;  Surgeon: Joseph Pierini, MD;  Location: [facility];  Service: Gynecology;  Laterality: N/A;   INTRAUTERINE DEVICE INSERTION     mirena-Inserted 05-16-14   KNEE SURGERY Left yrs ago   meniscurs tear repair   mirena removed  2021   OOPHORECTOMY  2008    left   ROTATOR CUFF REPAIR Right yrs ago   WRIST SURGERY Right    gang. cyst   Family History  Problem Relation Age of Onset   Hypertension Mother    Diabetes Mother    COPD Mother    Cancer Mother        Lung   Hypertension Father    Stroke Father 20   Diabetes Sister    Hypertension Brother    Diabetes Maternal Aunt    Cancer Maternal Aunt        Kidney   Kidney disease Maternal Aunt    Diabetes Maternal Aunt    Stroke Maternal Grandfather    Diabetes Maternal Grandfather    Colon cancer Neg Hx    Esophageal cancer Neg Hx    Rectal cancer Neg Hx    Stomach cancer Neg Hx    Outpatient Medications Prior to Visit  Medication Sig Dispense Refill   Ascorbic Acid (VITAMIN C) 1000 MG tablet Take 1,000 mg by mouth daily.     cholecalciferol (VITAMIN D3) 25 MCG (1000 UNIT) tablet Take 1,000 Units by mouth daily.     clonazePAM (KLONOPIN) 0.5 MG tablet Take 1 tablet (0.5 mg total) by mouth 2 (two) times daily as needed for anxiety. 30 tablet 1   ibuprofen (ADVIL) 800 MG tablet TAKE 1 TABLET 3 TIMES A DAY BY ORAL ROUTE.     metFORMIN (GLUCOPHAGE) 500 MG tablet Take 1 tablet (500 mg total) by mouth daily with breakfast. 90 tablet 3  olmesartan-hydrochlorothiazide (BENICAR HCT) 40-12.5 MG tablet TAKE 1 TABLET BY MOUTH EVERY DAY 90 tablet 3   PARoxetine (PAXIL) 20 MG tablet Take 1 tablet (20 mg total) by mouth daily. 90 tablet 3   rosuvastatin (CRESTOR) 10 MG tablet Take 1 tablet (10 mg total) by mouth daily. 90 tablet 3   sitaGLIPtin (JANUVIA) 100 MG tablet TAKE 1 TABLET BY MOUTH DAILY AFTER BREAKFAST. 90 tablet 3   zinc gluconate 50 MG tablet Take 50 mg by mouth daily.     No facility-administered medications prior to visit.   Allergies  Allergen Reactions   Dilaudid [Hydromorphone Hcl] Other (See Comments)    Broke in sweat and started shaking, can take oral   Sulfa Antibiotics Hives   Tylox [Oxycodone-Acetaminophen] Nausea And Vomiting   Clindamycin/Lincomycin Rash    hives       Objective:   Today's Vitals   04/10/22 0812  BP: 124/70  Pulse: 86  Temp: 97.6 F (36.4 C)  TempSrc: Temporal  SpO2: 97%  Weight: 180 lb 9.6 oz (81.9 kg)  Height: '4\' 11"'$  (1.499 m)   Body mass index is 36.48 kg/m.   General: Well developed, well nourished. No acute distress. HEENT: Normocephalic, non-traumatic. Conjunctiva clear. External ears normal. EAC and TMs normal bilaterally.   Nose clear without congestion or rhinorrhea. Mucous membranes moist. Oropharynx clear. Good dentition. Neck: Supple. No lymphadenopathy. No thyromegaly. Lungs: Clear to auscultation bilaterally. No wheezing, rales or rhonchi. Psych: Alert and oriented. Normal mood and affect.  Health Maintenance Due  Topic Date Due   MAMMOGRAM  01/05/2022   Diabetic kidney evaluation - eGFR measurement  03/28/2022   FOOT EXAM  03/28/2022   Lab Results POCT Covid: Positive POCT Influenza A& B: Neg.    Assessment & Plan:   Problem List Items Addressed This Visit       Other   COVID - Primary    Reviewed home care instructions for COVID. Advised self-isolation at home for at least 5 days. After 5 days, if improved and fever resolved, can be in public, but should wear a mask around others for an additional 5 days. Discussed home care for viral illness, including rest, pushing fluids, and OTC medications as needed for symptom relief. Recommend hot tea with honey for sore throat symptoms. If symptoms, esp, dyspnea develops/worsens, recommend in-person evaluation at either an urgent care or the emergency room.        Return if symptoms worsen or fail to improve.   Haydee Salter, MD

## 2022-04-10 NOTE — Assessment & Plan Note (Signed)
Reviewed home care instructions for COVID. Advised self-isolation at home for at least 5 days. After 5 days, if improved and fever resolved, can be in public, but should wear a mask around others for an additional 5 days. Discussed home care for viral illness, including rest, pushing fluids, and OTC medications as needed for symptom relief. Recommend hot tea with honey for sore throat symptoms. If symptoms, esp, dyspnea develops/worsens, recommend in-person evaluation at either an urgent care or the emergency room.

## 2022-04-11 ENCOUNTER — Ambulatory Visit: Payer: Managed Care, Other (non HMO) | Admitting: Physical Therapy

## 2022-04-15 NOTE — Therapy (Signed)
OUTPATIENT PHYSICAL THERAPY TREATMENT NOTE    Patient Name: [name] MRN: [id number] DOB:[birth year], 57 y.o., female Today's Date: 04/18/2022  PCP: Haydee Salter, MD    REFERRING PROVIDER: Jonelle Sidle D, PA   END OF SESSION:   PT End of Session - 04/18/22 1356     Visit Number 13    Number of Visits 15    Date for PT Re-Evaluation 05/02/22    Authorization Type [employer]    Authorization - Visit Number 6    Authorization - Number of Visits 20    PT Start Time [id number]    PT Stop Time [id number]    PT Time Calculation (min) 50 min    Activity Tolerance Patient tolerated treatment well;Patient limited by pain    Behavior During Therapy WFL for tasks assessed/performed               Past Medical History:  Diagnosis Date   Abnormal uterine bleeding (AUB)    Anxiety    Arthritis    Diabetes mellitus    Elevated cholesterol    Endometrial polyp    Endometrial polyp    Endometriosis    Frozen shoulder    Hypertension    Migraines    Nodule    Ovarian cyst    Past Surgical History:  Procedure Laterality Date   COMBINED HYSTEROSCOPY DIAGNOSTIC / D&C  yrs ago   [name]   with laser adhesions   DILITATION & CURRETTAGE/HYSTROSCOPY WITH NOVASURE ABLATION N/A 11/04/2019   Procedure: DILATATION & CURETTAGE/HYSTEROSCOPY WITH NOVASURE ABLATION;  Surgeon: Joseph Pierini, MD;  Location: [facility];  Service: Gynecology;  Laterality: N/A;   INTRAUTERINE DEVICE INSERTION     mirena-Inserted 05-16-14   KNEE SURGERY Left yrs ago   meniscurs tear repair   mirena removed  2021   OOPHORECTOMY  2008   left   ROTATOR CUFF REPAIR Right yrs ago   WRIST SURGERY Right    gang. cyst   Patient Active Problem List   Diagnosis Date Noted   COVID 04/10/2022   Adhesive capsulitis of left shoulder 01/31/2022   Depression with anxiety 09/27/2020   Migraine headache 09/27/2020   Insomnia 09/27/2020   Thyroid nodule 09/27/2020   Class 2  obesity due to excess calories with body mass index (BMI) of 37.0 to 37.9 in adult 03/09/2019   Acute right-sided low back pain with right-sided sciatica 06/10/2017   Trochanteric bursitis, right hip 05/27/2017   Atypical chest pain 11/18/2012   Type 2 diabetes mellitus ([place]) 11/18/2012   Essential hypertension 11/18/2012   Hyperlipidemia     REFERRING DIAG: M75.02 (ICD-10-CM) - Adhesive bursitis of left shoulder    THERAPY DIAG:  Adhesive capsulitis of left shoulder  Acute pain of left shoulder  Rationale for Evaluation and Treatment Rehabilitation  PERTINENT HISTORY: 2-3 month hx of L shoulder pain, injection 2 weeks ago provided some relief as well as an increase in AROM. Has difficulty reaching OH and B/H back as well as sleep positions.  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 2/10 pain at present, reports HEP going well. No other new updates    PAIN:  Are you having pain? 2/10 Pain location: L shoulder Pain description: ache/burning Aggravating factors: AROM Relieving factors: rest   OBJECTIVE: (objective measures completed at initial evaluation unless otherwise dated)   DIAGNOSTIC FINDINGS:  None available   PATIENT SURVEYS:  FOTO 54(64 predicted) 02/26/22: 57 FOTO  03/19/2022: 60   COGNITION: Overall cognitive status: Within functional limits for tasks assessed                                  SENSATION: Not tested   POSTURE: Slightly rounded and depressed L shoulder   UPPER EXTREMITY ROM:    A/PROM Right eval Left eval Left AROM 02/07/22 Left A/PROM 03/14/2022 L AROM 04/08/22  Shoulder flexion   95/125d 112 p! 130p!/  136p!/ 139 following MET 144 deg p!  Shoulder extension         Shoulder abduction   70/90d  95p!/108p! 110p! Significant shoulder hike  Shoulder adduction         Shoulder internal rotation Tip of scapula L SI joint/70d     Shoulder external rotation T3 C7/30d     Elbow flexion         Elbow extension         Wrist flexion         Wrist extension         Wrist ulnar deviation         Wrist radial deviation         Wrist pronation         Wrist supination         (Blank rows = not tested)   UPPER EXTREMITY MMT:   MMT Right eval Left eval Left 03/19/2022  Shoulder flexion   3+ 4p!  Shoulder extension   4+ 5  Shoulder abduction   3+ 4+, mild p!  Shoulder adduction       Shoulder internal rotation   4+ 5  Shoulder external rotation   3+ 5  Middle trapezius       Lower trapezius       Elbow flexion       Elbow extension       Wrist flexion       Wrist extension       Wrist ulnar deviation       Wrist radial deviation       Wrist pronation       Wrist supination       Grip strength (lbs)       (Blank rows = not tested)   SHOULDER SPECIAL TESTS: N/A   JOINT MOBILITY TESTING:  deferred   PALPATION:  TTP distal tip of acromion/AC joint             TODAY'S TREATMENT:   [facility] Adult PT Treatment:                                                DATE: 04/18/22 Therapeutic Exercise: Seated shoulder shrugs 2x10 emphasis on eccentric portion, rest breaks as needed LS stretch 3x30sec towards R only  Rhomboid stretch doorway 4x30sec  [employer]  column high>low rows 7# LUE only 2x8 cues for reduced shoulder hike and appropriate form BTB high>low row in doorway cues for home setup and HEP HEP update/review + handout  Manual Therapy: Seated STM to L LS, UT, rhomboid, deltoid, infraspinatus/teres. Gentle trigger point release throughout. Active pin and stretch L LS x5 cervical rotation to R   [facility] Adult PT Treatment:                                                DATE:  04/07/22 Therapeutic Exercise: Supine RC cuff manual perturbations 2x30sec cues for hold and positioning, shoulder ~90deg Supine dowel flexion 5# 2x8 cues for form, 3 sec hold at end range Sidelying ER 2x10 LUE cues for form and alignment  Manual Therapy: Supine; passive physiological movement all planes L GHJ, gentle distraction and oscillations to mitigate muscle guarding and maximize ROM   Therapeutic Activity: MSK assessment + education (including ROM and palpation), discussion re: symptom progression and PT POC   [facility] Adult PT Treatment:                                                DATE: 03/28/2022 Therapeutic Exercise: Supine BIL scaption with 4# dumbbells with 3-sec hold at end range with gravity assistance 3x10 Supine chin tuck isometric into pillow with scapular retraction into table 3x10 Standing BIL shoulder flexion AAROM with ball at wall with PT perturbations at end range 3x30sec Standing alternating "V" lifts with straight arms with black physioball 2x10 BIL Standing Lt shoulder scaption AAROM with UE ranger 2x10 with short-range horizontal adduction/ abduction at end-range Standing arm circle angels 2x5 Manual Therapy: Supine Lt shoulder flexion PROM with light distraction/ oscillation x4 minutes Supine grade 3 GHJ in all planes x4 minutes      PATIENT EDUCATION: Education details: rationale for interventions, relevant anatomy/physiology, PT progress and POC, monitoring symptoms and discussion w/ provider Person educated: Patient Education method: Explanation Education comprehension: verbalized understanding and needs further education   HOME EXERCISE PROGRAM: Access Code: [email or web address] URL: [email or web address] Date: 04/18/2022 Prepared by: Enis Slipper  Program Notes for scaption - use belt and towel to keep shoulder blade down  Exercises - Standing Shoulder Scaption  - 1 x daily - 7 x weekly - 2 sets - 8 reps - Standing Shoulder External  Rotation AAROM with Dowel  - 1 x daily - 7 x weekly - 2 sets - 8 reps - Shoulder Scaption AAROM with Dowel (Mirrored)  - 1 x daily - 7 x weekly - 2 sets - 10 reps - 5 seconds hold - Standing Shoulder Internal Rotation Stretch with Towel (Mirrored)  - 1 x daily - 7 x weekly - 3 sets - 10 reps - Doorway Rhomboid Stretch  - 1 x daily - 7 x weekly - 1 sets - 2-3 reps - 30sec hold - Standing High Row with Resistance  - 1 x daily - 7 x weekly - 2 sets - 8 reps   ASSESSMENT:   CLINICAL IMPRESSION: Pt arrives w/ increased pain since yesterday, unsure of any provocative factors. Pt requests checking back on soft tissue findings - consistent with previous sessions, no overt changes noted. Pt with increased symptom irritability overall on this  date, significant muscular tightness on exam that improves w/ manual, pt reporting transient relief w/ manual as above but minimal change after. Most relief reported w/ rhomboid stretch at doorway, tolerates high>low rows well with cues for reduced shoulder hike, education/performance w/ home setup. No adverse events, today primarily limited by increased muscle fatigue/irritability (symptoms reported up to 4/10). Pt departs today's session in no acute distress, all voiced questions/concerns addressed appropriately from PT perspective.      OBJECTIVE IMPAIRMENTS: decreased activity tolerance, decreased knowledge of condition, decreased mobility, decreased ROM, decreased strength, impaired UE functional use, and pain.    ACTIVITY LIMITATIONS: carrying, lifting, sleeping, bed mobility, and reach over head   PERSONAL FACTORS: Time since onset of injury/illness/exacerbation and 1 comorbidity: DM  are also affecting patient's functional outcome.       GOALS: Goals reviewed with patient? No   SHORT TERM GOALS: Target date: 02/07/22   Patient to demonstrate independence in HEP  Baseline:[id number] Goal status: MET   2.  Increase AROM to 120d flexion and  abduction Baseline:  A/PROM Right eval Left eval  Shoulder flexion   95/125d  Shoulder extension      Shoulder abduction   70/90d  Shoulder adduction      Shoulder internal rotation Tip of scapula L SI joint/70d  Shoulder external rotation T3 C7/30d    03/14/2022: 130 flexion, 95 abduction  Goal status: IN PROGRESS   3.  Decrease pain at night to 6/10 Baseline: 8/10 03/14/2022: 7/10 Goal status: IN PROGRESS       LONG TERM GOALS: Target date: 03/02/22   Increase AROM to 150d flexion and abduction Baseline:  A/PROM Right eval Left eval  Shoulder flexion   95/125d  Shoulder extension      Shoulder abduction   70/90d  Shoulder adduction      Shoulder internal rotation Tip of scapula L SI joint/70d  Shoulder external rotation T3 C7/30d    03/14/2022: 130 flexion, 95 abduction  Goal status: IN PROGRESS   2.  Increase L shoulder strength to 4/5 in deficit areas Baseline:  MMT Right eval Left eval  Shoulder flexion   3+  Shoulder extension   4+  Shoulder abduction   3+  Shoulder adduction      Shoulder internal rotation   4+  Shoulder external rotation   3+    03/19/2022:  MMT Right eval Left eval Left 03/19/2022  Shoulder flexion   3+ 4p!  Shoulder extension   4+ 5  Shoulder abduction   3+ 4+, mild p!  Shoulder adduction       Shoulder internal rotation   4+ 5  Shoulder external rotation   3+ 5   Goal status: ACHIEVED   3.  Increase FOTO score to 64 Baseline: 54 02/26/2022: 57 03/19/2022: 60 Goal status: IN PROGRESS   4.  Decrease worst pain to 4/10 Baseline: 8/10 03/14/2022: 7/10 Goal status: IN PROGRESS       PLAN:   PT FREQUENCY: 1x/week   PT DURATION: 6 weeks   PLANNED INTERVENTIONS: Therapeutic exercises, Therapeutic activity, Neuromuscular re-education, Balance training, Gait training, Patient/Family education, Self Care, Joint mobilization, Joint manipulation, Dry Needling, Manual therapy, and Re-evaluation   PLAN FOR NEXT SESSION:     continue scapular dissociation with elevation, pulling movements. Manual as indicated. Reduce frequency to every other week per discussion w/ pt    Leeroy Cha PT, DPT 04/18/2022 3:03 PM

## 2022-04-18 ENCOUNTER — Ambulatory Visit: Payer: Managed Care, Other (non HMO) | Admitting: Physical Therapy

## 2022-04-18 ENCOUNTER — Encounter: Payer: Self-pay | Admitting: Physical Therapy

## 2022-04-18 DIAGNOSIS — M7502 Adhesive capsulitis of left shoulder: Secondary | ICD-10-CM

## 2022-04-18 DIAGNOSIS — M25512 Pain in left shoulder: Secondary | ICD-10-CM

## 2022-04-25 ENCOUNTER — Ambulatory Visit: Payer: Managed Care, Other (non HMO) | Admitting: Physical Therapy

## 2022-05-01 ENCOUNTER — Other Ambulatory Visit: Payer: Self-pay | Admitting: Family Medicine

## 2022-05-01 DIAGNOSIS — F418 Other specified anxiety disorders: Secondary | ICD-10-CM

## 2022-05-01 NOTE — Therapy (Incomplete)
OUTPATIENT PHYSICAL THERAPY TREATMENT NOTE    Patient Name: Shadawn Schwering MRN: [id number] DOB:06/07/1965, 57 y.o., female Today's Date: 05/01/2022  PCP: Haydee Salter, MD    REFERRING PROVIDER: Jonelle Sidle D, PA   END OF SESSION:       Past Medical History:  Diagnosis Date   Abnormal uterine bleeding (AUB)    Anxiety    Arthritis    Diabetes mellitus    Elevated cholesterol    Endometrial polyp    Endometrial polyp    Endometriosis    Frozen shoulder    Hypertension    Migraines    Nodule    Ovarian cyst    Past Surgical History:  Procedure Laterality Date   COMBINED HYSTEROSCOPY DIAGNOSTIC / D&C  yrs ago   [place]   with laser adhesions   DILITATION & CURRETTAGE/HYSTROSCOPY WITH NOVASURE ABLATION N/A 11/04/2019   Procedure: DILATATION & CURETTAGE/HYSTEROSCOPY WITH NOVASURE ABLATION;  Surgeon: Joseph Pierini, MD;  Location: [facility];  Service: Gynecology;  Laterality: N/A;   INTRAUTERINE DEVICE INSERTION     mirena-Inserted 05-16-14   KNEE SURGERY Left yrs ago   meniscurs tear repair   mirena removed  2021   OOPHORECTOMY  2008   left   ROTATOR CUFF REPAIR Right yrs ago   WRIST SURGERY Right    gang. cyst   Patient Active Problem List   Diagnosis Date Noted   COVID 04/10/2022   Adhesive capsulitis of left shoulder 01/31/2022   Depression with anxiety 09/27/2020   Migraine headache 09/27/2020   Insomnia 09/27/2020   Thyroid nodule 09/27/2020   Class 2 obesity due to excess calories with body mass index (BMI) of 37.0 to 37.9 in adult 03/09/2019   Acute right-sided low back pain with right-sided sciatica 06/10/2017   Trochanteric bursitis, right hip 05/27/2017   Atypical chest pain 11/18/2012   Type 2 diabetes mellitus ([place]) 11/18/2012   Essential hypertension 11/18/2012   Hyperlipidemia     REFERRING DIAG: M75.02 (ICD-10-CM) - Adhesive bursitis of left shoulder    THERAPY DIAG:  No diagnosis  found.  Rationale for Evaluation and Treatment Rehabilitation  PERTINENT HISTORY: 2-3 month hx of L shoulder pain, injection 2 weeks ago provided some relief as well as an increase in AROM. Has difficulty reaching OH and B/H back as well as sleep positions.  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 2/10 pain at present, reports HEP going well. No other new updates    PAIN:  Are you having pain? *** 2/10 Pain location: L shoulder Pain description: ache/burning Aggravating factors: AROM Relieving factors: rest   OBJECTIVE: (objective measures completed at initial evaluation unless otherwise dated)   DIAGNOSTIC FINDINGS:  None available   PATIENT SURVEYS:  FOTO 54(64 predicted) 02/26/22: 57 FOTO  03/19/2022: 60   COGNITION: Overall cognitive status: Within functional limits for tasks assessed                                  SENSATION: Not tested   POSTURE: Slightly rounded and depressed L shoulder   UPPER EXTREMITY ROM:    A/PROM Right eval Left eval Left AROM 02/07/22 Left A/PROM 03/14/2022 L AROM 04/08/22  Shoulder flexion   95/125d 112 p! 130p!/ 136p!/ 139 following MET 144 deg p!  Shoulder extension         Shoulder abduction   70/90d  95p!/108p! 110p! Significant shoulder hike  Shoulder adduction         Shoulder internal rotation Tip of scapula L SI joint/70d     Shoulder external rotation T3 C7/30d     Elbow flexion         Elbow extension         Wrist flexion         Wrist extension         Wrist ulnar deviation         Wrist radial deviation         Wrist pronation         Wrist supination         (Blank rows =  not tested)   UPPER EXTREMITY MMT:   MMT Right eval Left eval Left 03/19/2022  Shoulder flexion   3+ 4p!  Shoulder extension   4+ 5  Shoulder abduction   3+ 4+, mild p!  Shoulder adduction       Shoulder internal rotation   4+ 5  Shoulder external rotation   3+ 5  Middle trapezius       Lower trapezius       Elbow flexion       Elbow extension       Wrist flexion       Wrist extension       Wrist ulnar deviation       Wrist radial deviation       Wrist pronation       Wrist supination       Grip strength (lbs)       (Blank rows = not tested)   SHOULDER SPECIAL TESTS: N/A   JOINT MOBILITY TESTING:  deferred   PALPATION:  TTP distal tip of acromion/AC joint             TODAY'S TREATMENT:   [facility] Adult PT Treatment:                                                DATE: 05/02/22 Therapeutic Exercise: *** Manual Therapy: *** Neuromuscular re-ed: *** Therapeutic Activity: *** Modalities: *** Self Care: [facility] Adult PT Treatment:  DATE: 04/18/22 Therapeutic Exercise: Seated shoulder shrugs 2x10 emphasis on eccentric portion, rest breaks as needed LS stretch 3x30sec towards R only  Rhomboid stretch doorway 4x30sec  Cable column high>low rows 7# LUE only 2x8 cues for reduced shoulder hike and appropriate form BTB high>low row in doorway cues for home setup and HEP HEP update/review + handout  Manual Therapy: Seated STM to L LS, UT, rhomboid, deltoid, infraspinatus/teres. Gentle trigger point release throughout. Active pin and stretch L LS x5 cervical rotation to R   [facility] Adult PT Treatment:                                                DATE: 04/07/22 Therapeutic Exercise: Supine RC cuff manual perturbations 2x30sec cues for hold and positioning, shoulder ~90deg Supine dowel flexion 5# 2x8 cues for form, 3 sec hold at end range Sidelying ER 2x10 LUE cues for form and alignment  Manual Therapy: Supine; passive  physiological movement all planes L GHJ, gentle distraction and oscillations to mitigate muscle guarding and maximize ROM   Therapeutic Activity: MSK assessment + education (including ROM and palpation), discussion re: symptom progression and PT POC      PATIENT EDUCATION: Education details: rationale for interventions, relevant anatomy/physiology, PT progress and POC, monitoring symptoms and discussion w/ provider Person educated: Patient Education method: Explanation Education comprehension: verbalized understanding and needs further education   HOME EXERCISE PROGRAM: Access Code: [email or web address] URL: [email or web address] Date: 04/18/2022 Prepared by: Enis Slipper  Program Notes for scaption - use belt and towel to keep shoulder blade down  Exercises - Standing Shoulder Scaption  - 1 x daily - 7 x weekly - 2 sets - 8 reps - Standing Shoulder External Rotation AAROM with Dowel  - 1 x daily - 7 x weekly - 2 sets - 8 reps - Shoulder Scaption AAROM with Dowel (Mirrored)  - 1 x daily - 7 x weekly - 2 sets - 10 reps - 5 seconds hold - Standing Shoulder Internal Rotation Stretch with Towel (Mirrored)  - 1 x daily - 7 x weekly - 3 sets - 10 reps - Doorway Rhomboid Stretch  - 1 x daily - 7 x weekly - 1 sets - 2-3 reps - 30sec hold - Standing High Row with Resistance  - 1 x daily - 7 x weekly - 2 sets - 8 reps   ASSESSMENT:   CLINICAL IMPRESSION: ***  *** Pt arrives w/ increased pain since yesterday, unsure of any provocative factors. Pt requests checking back on soft tissue findings - consistent with previous sessions, no overt changes noted. Pt with increased symptom irritability overall on this date, significant muscular tightness on exam that improves w/ manual, pt reporting transient relief w/ manual as above but minimal change after. Most relief reported w/ rhomboid stretch at doorway, tolerates high>low rows well with cues for reduced shoulder hike, education/performance w/  home setup. No adverse events, today primarily limited by increased muscle fatigue/irritability (symptoms reported up to 4/10). Pt departs today's session in no acute distress, all voiced questions/concerns addressed appropriately from PT perspective.      OBJECTIVE IMPAIRMENTS: decreased activity tolerance, decreased knowledge of condition, decreased mobility, decreased ROM, decreased strength, impaired UE functional use, and pain.    ACTIVITY LIMITATIONS: carrying, lifting, sleeping, bed mobility, and reach over head   PERSONAL FACTORS: Time since onset of injury/illness/exacerbation  and 1 comorbidity: DM  are also affecting patient's functional outcome.       GOALS: Goals reviewed with patient? No   SHORT TERM GOALS: Target date: 02/07/22   Patient to demonstrate independence in HEP  Baseline:[id number] Goal status: MET   2.  Increase AROM to 120d flexion and abduction Baseline:  A/PROM Right eval Left eval  Shoulder flexion   95/125d  Shoulder extension      Shoulder abduction   70/90d  Shoulder adduction      Shoulder internal rotation Tip of scapula L SI joint/70d  Shoulder external rotation T3 C7/30d    03/14/2022: 130 flexion, 95 abduction  Goal status: IN PROGRESS   3.  Decrease pain at night to 6/10 Baseline: 8/10 03/14/2022: 7/10 Goal status: IN PROGRESS       LONG TERM GOALS: Target date: 03/02/22   Increase AROM to 150d flexion and abduction Baseline:  A/PROM Right eval Left eval  Shoulder flexion   95/125d  Shoulder extension      Shoulder abduction   70/90d  Shoulder adduction      Shoulder internal rotation Tip of scapula L SI joint/70d  Shoulder external rotation T3 C7/30d    03/14/2022: 130 flexion, 95 abduction  Goal status: IN PROGRESS   2.  Increase L shoulder strength to 4/5 in deficit areas Baseline:  MMT Right eval Left eval  Shoulder flexion   3+  Shoulder extension   4+  Shoulder abduction   3+  Shoulder adduction      Shoulder  internal rotation   4+  Shoulder external rotation   3+    03/19/2022:  MMT Right eval Left eval Left 03/19/2022  Shoulder flexion   3+ 4p!  Shoulder extension   4+ 5  Shoulder abduction   3+ 4+, mild p!  Shoulder adduction       Shoulder internal rotation   4+ 5  Shoulder external rotation   3+ 5   Goal status: ACHIEVED   3.  Increase FOTO score to 64 Baseline: 54 02/26/2022: 57 03/19/2022: 60 Goal status: IN PROGRESS   4.  Decrease worst pain to 4/10 Baseline: 8/10 03/14/2022: 7/10 Goal status: IN PROGRESS       PLAN:   PT FREQUENCY: 1x/week   PT DURATION: 6 weeks   PLANNED INTERVENTIONS: Therapeutic exercises, Therapeutic activity, Neuromuscular re-education, Balance training, Gait training, Patient/Family education, Self Care, Joint mobilization, Joint manipulation, Dry Needling, Manual therapy, and Re-evaluation   PLAN FOR NEXT SESSION:    continue scapular dissociation with elevation, pulling movements. Manual as indicated. Reduce frequency to every other week per discussion w/ pt ***    Leeroy Cha PT, DPT 05/01/2022 8:55 AM

## 2022-05-01 NOTE — Telephone Encounter (Signed)
Refill request for  Paroxetine 20 mg LR  01/17/21, #90, 3 rf  Olmesastan-HCTZ 40/12.5 mg LR 03/14/21, #90, 3 rf LOV 02/21/22 FOV 05/19/22  Please review and advise.  Thanks.  Dm/cma

## 2022-05-02 ENCOUNTER — Ambulatory Visit: Payer: Managed Care, Other (non HMO) | Admitting: Physical Therapy

## 2022-05-05 ENCOUNTER — Encounter: Payer: Self-pay | Admitting: Physical Therapy

## 2022-05-05 ENCOUNTER — Ambulatory Visit: Payer: Managed Care, Other (non HMO) | Attending: Family Medicine | Admitting: Physical Therapy

## 2022-05-05 DIAGNOSIS — M7502 Adhesive capsulitis of left shoulder: Secondary | ICD-10-CM

## 2022-05-05 DIAGNOSIS — M25512 Pain in left shoulder: Secondary | ICD-10-CM | POA: Diagnosis present

## 2022-05-05 NOTE — Therapy (Signed)
OUTPATIENT PHYSICAL THERAPY TREATMENT NOTE + DISCHARGE SUMMARY    Patient Name: [name] MRN: [id number] DOB:[birth year], 56 y.o., female Today's Date: 05/05/2022  PHYSICAL THERAPY DISCHARGE SUMMARY  Visits from Start of Care: 14  Current functional level related to goals / functional outcomes: Able to perform majority of daily activities w/ increased pain/difficulty   Remaining deficits: Reduced LUE ROM, weakness, and pain   Education / Equipment: Discharge education (see below for details)   Patient agrees to discharge. Patient goals were not met. Patient is being discharged due to plateau in progress.    PCP: Haydee Salter, MD    REFERRING PROVIDER: Jonelle Sidle D, PA   END OF SESSION:   PT End of Session - 05/05/22 1326     Visit Number 14    Number of Visits --    Date for PT Re-Evaluation --    Authorization Type [employer]    Authorization - Visit Number 7    Authorization - Number of Visits 20    PT Start Time [id number]    PT Stop Time [id number]    PT Time Calculation (min) 47 min    Activity Tolerance Patient tolerated treatment well;Patient limited by pain    Behavior During Therapy WFL for tasks assessed/performed                Past Medical History:  Diagnosis Date   Abnormal uterine bleeding (AUB)    Anxiety    Arthritis    Diabetes mellitus    Elevated cholesterol    Endometrial polyp    Endometrial polyp    Endometriosis    Frozen shoulder    Hypertension    Migraines    Nodule    Ovarian cyst    Past Surgical History:  Procedure Laterality Date   COMBINED HYSTEROSCOPY DIAGNOSTIC / D&C  yrs ago   [place]   with laser adhesions   DILITATION & CURRETTAGE/HYSTROSCOPY WITH NOVASURE ABLATION N/A 11/04/2019   Procedure: DILATATION & CURETTAGE/HYSTEROSCOPY WITH NOVASURE ABLATION;  Surgeon: Joseph Pierini, MD;  Location: [place];  Service: Gynecology;  Laterality: N/A;   INTRAUTERINE DEVICE  INSERTION     mirena-Inserted 05-16-14   KNEE SURGERY Left yrs ago   meniscurs tear repair   mirena removed  2021   OOPHORECTOMY  2008   left   ROTATOR CUFF REPAIR Right yrs ago   WRIST SURGERY Right    gang. cyst   Patient Active Problem List   Diagnosis Date Noted   COVID 04/10/2022   Adhesive capsulitis of left shoulder 01/31/2022   Depression with anxiety 09/27/2020   Migraine headache 09/27/2020   Insomnia 09/27/2020   Thyroid nodule 09/27/2020   Class 2 obesity due to excess calories with body mass index (BMI) of 37.0 to 37.9 in adult 03/09/2019   Acute right-sided low back pain with right-sided sciatica 06/10/2017   Trochanteric bursitis, right hip 05/27/2017   Atypical chest pain 11/18/2012   Type 2 diabetes mellitus ([place]) 11/18/2012   Essential hypertension 11/18/2012   Hyperlipidemia     REFERRING DIAG: M75.02 (ICD-10-CM) - Adhesive bursitis of left shoulder    THERAPY DIAG:  Adhesive capsulitis of left shoulder  Acute pain of left shoulder  Rationale for Evaluation and Treatment Rehabilitation  PERTINENT HISTORY: 2-3 month hx of L shoulder pain, injection 2 weeks ago provided some relief as well as an increase in AROM. Has difficulty reaching OH and B/H back as well as sleep  positions.  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 Sees ortho Thursday   PAIN:  Are you having pain? 6/10 (worst in past week: 6/10) Pain location: L shoulder Pain description: ache/burning Aggravating factors: AROM Relieving factors: rest   OBJECTIVE: (objective measures completed at initial evaluation unless otherwise  dated)   DIAGNOSTIC FINDINGS:  None available   PATIENT SURVEYS:  FOTO 54(64 predicted) 02/26/22: 57 FOTO  03/19/2022: 60 05/05/22 FOTO: 67%    COGNITION: Overall cognitive status: Within functional limits for tasks assessed                                  SENSATION: Not tested   POSTURE: Slightly rounded and depressed L shoulder   UPPER EXTREMITY ROM:    A/PROM Right eval Left eval Left AROM 02/07/22 Left A/PROM 03/14/2022 L AROM 04/08/22 L AROM 05/05/22  Shoulder flexion   95/125d 112 p! 130p!/ 136p!/ 139 following MET 144 deg p! 140 deg p!  Shoulder extension          Shoulder abduction   70/90d  95p!/108p! 110p! Significant shoulder hike 114 deg p!   Shoulder adduction          Shoulder internal rotation Tip of scapula L SI joint/70d      Shoulder external rotation T3 C7/30d      Elbow flexion          Elbow extension          Wrist flexion          Wrist extension          Wrist ulnar deviation          Wrist radial deviation          Wrist pronation          Wrist supination          (Blank rows = not tested)   UPPER EXTREMITY MMT:   MMT Right eval Left eval Left 03/19/2022 Left 05/05/22   Shoulder flexion   3+ 4p! 4+ p!  Shoulder extension   4+ 5   Shoulder abduction   3+ 4+, mild p! 4+ p!  Shoulder adduction  Shoulder internal rotation   4+ 5 5  Shoulder external rotation   3+ 5 5  Middle trapezius        Lower trapezius        Elbow flexion        Elbow extension        Wrist flexion        Wrist extension        Wrist ulnar deviation        Wrist radial deviation        Wrist pronation        Wrist supination        Grip strength (lbs)        (Blank rows = not tested)   SHOULDER SPECIAL TESTS: N/A   JOINT MOBILITY TESTING:  deferred   PALPATION:  TTP distal tip of acromion/AC joint             TODAY'S TREATMENT:   [facility] Adult PT Treatment:                                                DATE: 05/05/22 Therapeutic Exercise: Scaption w/  strap x8 LUE only Strap IR 2x30sec cues for setup/HEP Shoulder flexion walkback at counter x8 cues for form and appropriate ROM  BTB high row x10 LUE cues for HEP setup BTB mid row x10 BUE cues for HEP setup HEP handout + education  Therapeutic Activity: FOTO + education MSK assessment + education Discharge education re: activity modification, monitoring symptoms, pacing of activities, communication w/ provider   PATIENT EDUCATION: Education details: rationale for interventions, relevant anatomy/physiology, PT progress and POC, monitoring symptoms and discussion w/ provider Person educated: Patient Education method: Explanation Education comprehension: verbalized understanding and needs further education   HOME EXERCISE PROGRAM: Access Code: [email or web address] URL: [email or web address] Date: 05/05/2022 Prepared by: Enis Slipper  Program Notes for scaption - use belt and towel to keep shoulder blade down  Exercises - Standing 'L' Stretch at Counter  - 1 x daily - 7 x weekly - 2 sets - 10 reps - Standing Shoulder External Rotation AAROM with Dowel  - 1 x daily - 7 x weekly - 2 sets - 8 reps - Standing Shoulder Internal Rotation Stretch with Towel (Mirrored)  - 1 x daily - 7 x weekly - 3 sets - 10 reps - Doorway Rhomboid Stretch  - 1 x daily - 7 x weekly - 1 sets - 2-3 reps - 30sec hold - Standing High Row with Resistance  - 1 x daily - 7 x weekly - 2 sets - 8 reps - Standing Shoulder Row with Anchored Resistance  - 1 x daily - 7 x weekly - 2 sets - 10 reps - Standing Shoulder Scaption  - 1 x daily - 7 x weekly - 2 sets - 8 reps   ASSESSMENT:   CLINICAL IMPRESSION: Pt arrives w/ 0000000 pain for recertification visit - updated MSK assessment as above. AROM has improved compared to initial evaluation but does appear to have hit plateau, MMT improved as well. Pain levels continue to slowly improve overall although they do fluctuate. After discussion with patient, mutual decision  made to discharge to independent HEP at this time given plateau in progress. Education provided on monitoring symptoms, extensive time spent w/ education on HEP performance, exercise selection, and activity modification. No adverse  events, pt does well with HEP. Will discharge at this time, discharge education provided. Pt departs today's session in no acute distress, all voiced questions/concerns addressed appropriately from PT perspective.      OBJECTIVE IMPAIRMENTS: decreased activity tolerance, decreased knowledge of condition, decreased mobility, decreased ROM, decreased strength, impaired UE functional use, and pain.    ACTIVITY LIMITATIONS: carrying, lifting, sleeping, bed mobility, and reach over head   PERSONAL FACTORS: Time since onset of injury/illness/exacerbation and 1 comorbidity: DM  are also affecting patient's functional outcome.       GOALS: Goals reviewed with patient? No   SHORT TERM GOALS: Target date: 02/07/22   Patient to demonstrate independence in HEP  Baseline:[id number] Goal status: MET   2.  Increase AROM to 120d flexion and abduction Baseline:  A/PROM Right eval Left eval  Shoulder flexion   95/125d  Shoulder extension      Shoulder abduction   70/90d  Shoulder adduction      Shoulder internal rotation Tip of scapula L SI joint/70d  Shoulder external rotation T3 C7/30d    03/14/2022: 130 flexion, 95 abduction  05/05/22: 140 deg flexion, 114 deg abduction  Goal status: PARTIALLY MET    3.  Decrease pain at night to 6/10 Baseline: 8/10 03/14/2022: 7/10 05/05/22: 6/10 Goal status: NOT MET       LONG TERM GOALS: Target date: 03/02/22   Increase AROM to 150d flexion and abduction Baseline:  A/PROM Right eval Left eval  Shoulder flexion   95/125d  Shoulder extension      Shoulder abduction   70/90d  Shoulder adduction      Shoulder internal rotation Tip of scapula L SI joint/70d  Shoulder external rotation T3 C7/30d    03/14/2022: 130 flexion,  95 abduction  05/05/22: 140 deg flexion, 114 deg abduction  Goal status: NOT MET    2.  Increase L shoulder strength to 4/5 in deficit areas Baseline:  MMT Right eval Left eval  Shoulder flexion   3+  Shoulder extension   4+  Shoulder abduction   3+  Shoulder adduction      Shoulder internal rotation   4+  Shoulder external rotation   3+    03/19/2022:  MMT Right eval Left eval Left 03/19/2022  Shoulder flexion   3+ 4p!  Shoulder extension   4+ 5  Shoulder abduction   3+ 4+, mild p!  Shoulder adduction       Shoulder internal rotation   4+ 5  Shoulder external rotation   3+ 5   Goal status: MET   3.  Increase FOTO score to 64 Baseline: 54 02/26/2022: 57 03/19/2022: 60 05/05/22: 67  Goal status: MET   4.  Decrease worst pain to 4/10 Baseline: 8/10 03/14/2022: 7/10 05/05/22: 6/10  Goal status: NOT MET        PLAN:   PT FREQUENCY: NA   PT DURATION: NA   PLANNED INTERVENTIONS: Therapeutic exercises, Therapeutic activity, Neuromuscular re-education, Balance training, Gait training, Patient/Family education, Self Care, Joint mobilization, Joint manipulation, Dry Needling, Manual therapy, and Re-evaluation   PLAN FOR NEXT SESSION:   NA - discharging to independent HEP   Leeroy Cha PT, DPT 05/05/2022 2:28 PM

## 2022-05-08 ENCOUNTER — Encounter: Payer: Self-pay | Admitting: Radiology

## 2022-05-16 ENCOUNTER — Ambulatory Visit: Payer: Managed Care, Other (non HMO) | Admitting: Physical Therapy

## 2022-05-19 ENCOUNTER — Ambulatory Visit (INDEPENDENT_AMBULATORY_CARE_PROVIDER_SITE_OTHER): Payer: Managed Care, Other (non HMO) | Admitting: Family Medicine

## 2022-05-19 ENCOUNTER — Encounter: Payer: Self-pay | Admitting: Family Medicine

## 2022-05-19 VITALS — BP 120/74 | HR 85 | Temp 97.8°F | Ht 59.0 in | Wt 181.4 lb

## 2022-05-19 DIAGNOSIS — F418 Other specified anxiety disorders: Secondary | ICD-10-CM | POA: Diagnosis not present

## 2022-05-19 DIAGNOSIS — E785 Hyperlipidemia, unspecified: Secondary | ICD-10-CM | POA: Diagnosis not present

## 2022-05-19 DIAGNOSIS — E119 Type 2 diabetes mellitus without complications: Secondary | ICD-10-CM | POA: Diagnosis not present

## 2022-05-19 DIAGNOSIS — I10 Essential (primary) hypertension: Secondary | ICD-10-CM

## 2022-05-19 LAB — BASIC METABOLIC PANEL
BUN: 13 mg/dL (ref 6–23)
CO2: 30 mEq/L (ref 19–32)
Calcium: 9.8 mg/dL (ref 8.4–10.5)
Chloride: 99 mEq/L (ref 96–112)
Creatinine, Ser: 0.77 mg/dL (ref 0.40–1.20)
GFR: 87.26 mL/min (ref >60.00)
Glucose, Bld: 114 mg/dL — ABNORMAL HIGH (ref 70–99)
Potassium: 3.6 mEq/L (ref 3.5–5.1)
Sodium: 140 mEq/L (ref 135–145)

## 2022-05-19 LAB — URINALYSIS, ROUTINE W REFLEX MICROSCOPIC
Bilirubin Urine: NEGATIVE
Ketones, ur: NEGATIVE
Nitrite: NEGATIVE
Specific Gravity, Urine: 1.02 (ref 1.000–1.030)
Total Protein, Urine: NEGATIVE
Urine Glucose: NEGATIVE
Urobilinogen, UA: 0.2 (ref 0.0–1.0)
pH: 6.5 (ref 5.0–8.0)

## 2022-05-19 LAB — HEMOGLOBIN A1C: Hgb A1c MFr Bld: 6.6 % — ABNORMAL HIGH (ref 4.6–6.5)

## 2022-05-19 LAB — MICROALBUMIN / CREATININE URINE RATIO
Creatinine,U: 104.3 mg/dL
Microalb Creat Ratio: 1 mg/g (ref 0.0–30.0)
Microalb, Ur: 1.1 mg/dL (ref 0.0–1.9)

## 2022-05-19 MED ORDER — OLMESARTAN MEDOXOMIL-HCTZ 40-12.5 MG PO TABS: 1.0000 | ORAL_TABLET | Freq: Every day | ORAL | 3 refills | Status: DC

## 2022-05-19 NOTE — Assessment & Plan Note (Signed)
We will check Annual DM labs today. Foot exam completed. Continue sitagliptin 100 mg daily and metformin 500 mg daily.

## 2022-05-19 NOTE — Assessment & Plan Note (Signed)
Blood pressure is in good control. Continue Benicar HCT 40-12.5 mg daily.

## 2022-05-19 NOTE — Assessment & Plan Note (Signed)
Well managed on paroxetine 20 mg daily.

## 2022-05-19 NOTE — Assessment & Plan Note (Signed)
Continue rosuvastatin 10 mg daily. 

## 2022-05-19 NOTE — Progress Notes (Signed)
[facility] [address] [place] [address] Dept: [phone] Dept Fax: [phone]  Chronic Care Office Visit  Subjective:    Patient ID: [name], female    DOB: 1966-01-01, 57 y.o..   MRN: [id number]  Chief Complaint  Patient presents with   Medical Management of Chronic Issues    3 month f/u. Fasting today. Average BS at home  96- 106   History of Present Illness:  Patient is in today for reassessment of chronic medical issues.  [name] has a history of hypertension, managed on Benicar (olmesartan/HCTZ) 40-12.5 mg daily.   [name] has a history of Type 2 diabetes. She is managed on sitagliptin (Januvia) 100 mg daily and metformin 500 mg daily.   [name] has a history of hyperlipidemia and is managed on rosuvastatin 10 mg daily.   [name] has a history of anxiety/depression. She is managed on paroxetine 20 mg daily and clonazepam 0.5 mg PRN. She feels her mood is improved.  Past Medical History: Patient Active Problem List   Diagnosis Date Noted   COVID 04/10/2022   Adhesive capsulitis of left shoulder 01/31/2022   Depression with anxiety 09/27/2020   Migraine headache 09/27/2020   Insomnia 09/27/2020   Thyroid nodule 09/27/2020   Class 2 obesity due to excess calories with body mass index (BMI) of 37.0 to 37.9 in adult 03/09/2019   Acute right-sided low back pain with right-sided sciatica 06/10/2017   Trochanteric bursitis, right hip 05/27/2017   Atypical chest pain 11/18/2012   Type 2 diabetes mellitus ([place]) 11/18/2012   Essential hypertension 11/18/2012   Hyperlipidemia    Past Surgical History:  Procedure Laterality Date   COMBINED HYSTEROSCOPY DIAGNOSTIC / D&C  yrs ago   [place]   with laser adhesions   DILITATION & CURRETTAGE/HYSTROSCOPY WITH NOVASURE ABLATION N/A 11/04/2019   Procedure: DILATATION & CURETTAGE/HYSTEROSCOPY WITH NOVASURE  ABLATION;  Surgeon: Joseph Pierini, MD;  Location: [facility];  Service: Gynecology;  Laterality: N/A;   INTRAUTERINE DEVICE INSERTION     mirena-Inserted 05-16-14   KNEE SURGERY Left yrs ago   meniscurs tear repair   mirena removed  2021   OOPHORECTOMY  2008   left   ROTATOR CUFF REPAIR Right yrs ago   WRIST SURGERY Right    gang. cyst   Family History  Problem Relation Age of Onset   Hypertension Mother    Diabetes Mother    COPD Mother    Cancer Mother        Lung   Hypertension Father    Stroke Father 88   Diabetes Sister    Hypertension Brother    Diabetes Maternal Aunt    Cancer Maternal Aunt        Kidney   Kidney disease Maternal Aunt    Diabetes Maternal Aunt    Stroke Maternal Grandfather    Diabetes Maternal Grandfather    Colon cancer Neg Hx    Esophageal cancer Neg Hx    Rectal cancer Neg Hx    Stomach cancer Neg Hx    Outpatient Medications Prior to Visit  Medication Sig Dispense Refill   Ascorbic Acid (VITAMIN C) 1000 MG tablet Take 1,000 mg by mouth daily.     cholecalciferol (VITAMIN D3) 25 MCG (1000 UNIT) tablet Take 1,000 Units by mouth daily.     clonazePAM (KLONOPIN) 0.5 MG tablet Take 1 tablet (0.5 mg total) by mouth 2 (two) times  daily as needed for anxiety. 30 tablet 1   ibuprofen (ADVIL) 800 MG tablet TAKE 1 TABLET 3 TIMES A DAY BY ORAL ROUTE.     metFORMIN (GLUCOPHAGE) 500 MG tablet Take 1 tablet (500 mg total) by mouth daily with breakfast. 90 tablet 3   PARoxetine (PAXIL) 20 MG tablet TAKE 1 TABLET BY MOUTH EVERY DAY 90 tablet 3   rosuvastatin (CRESTOR) 10 MG tablet Take 1 tablet (10 mg total) by mouth daily. 90 tablet 3   sitaGLIPtin (JANUVIA) 100 MG tablet TAKE 1 TABLET BY MOUTH DAILY AFTER BREAKFAST. 90 tablet 3   zinc gluconate 50 MG tablet Take 50 mg by mouth daily.     olmesartan-hydrochlorothiazide (BENICAR HCT) 40-12.5 MG tablet TAKE 1 TABLET BY MOUTH EVERY DAY 90 tablet 3   No facility-administered medications prior  to visit.   Allergies  Allergen Reactions   Dilaudid [Hydromorphone Hcl] Other (See Comments)    Broke in sweat and started shaking, can take oral   Sulfa Antibiotics Hives   Tylox [Oxycodone-Acetaminophen] Nausea And Vomiting   Clindamycin/Lincomycin Rash    hives   Objective:   Today's Vitals   05/19/22 0804  BP: 120/74  Pulse: 85  Temp: 97.8 F (36.6 C)  TempSrc: Temporal  SpO2: 97%  Weight: 181 lb 6.4 oz (82.3 kg)  Height: 4\' 11"  (1.499 m)   Body mass index is 36.64 kg/m.   General: Well developed, well nourished. No acute distress. Feet- Skin intact. No sign of maceration between toes. Nails are normal. Dorsalis pedis and posterior tibial artery pulses   are normal. 5.07 monofilament testing normal. Psych: Alert and oriented. Normal mood and affect.  Health Maintenance Due  Topic Date Due   Diabetic kidney evaluation - eGFR measurement  03/28/2022        05/19/2022    8:20 AM 02/11/2022    8:42 AM 11/12/2021    8:08 AM  Depression screen PHQ 2/9  Decreased Interest 0 0 0  Down, Depressed, Hopeless 0 0 0  PHQ - 2 Score 0 0 0  Altered sleeping 0 0 0  Tired, decreased energy 0 0 0  Change in appetite 0 0 0  Feeling bad or failure about yourself  0 0 0  Trouble concentrating 0 0 0  Moving slowly or fidgety/restless 0 0 0  Suicidal thoughts 0 0 0  PHQ-9 Score 0 0 0  Difficult doing work/chores Not difficult at all Not difficult at all Not difficult at all      05/19/2022    8:20 AM 02/11/2022    8:42 AM 03/28/2021   11:43 AM 11/29/2020    9:13 AM  GAD 7 : Generalized Anxiety Score  Nervous, Anxious, on Edge 0 0 0 0  Control/stop worrying 0 0 0 0  Worry too much - different things 0 0 0 0  Trouble relaxing 0 0 0 0  Restless 0 0 0 0  Easily annoyed or irritable 0 0 0 0  Afraid - awful might happen 0 0 0 0  Total GAD 7 Score 0 0 0 0  Anxiety Difficulty Not difficult at all Not difficult at all Not difficult at all Not difficult at all   Assessment &  Plan:   Problem List Items Addressed This Visit       Cardiovascular and Mediastinum   Essential hypertension   Relevant Medications   olmesartan-hydrochlorothiazide (BENICAR HCT) 40-12.5 MG tablet     Endocrine   Type 2 diabetes mellitus (  [place]) - Primary   Relevant Medications   olmesartan-hydrochlorothiazide (BENICAR HCT) 40-12.5 MG tablet   Other Relevant Orders   Microalbumin / creatinine urine ratio   Basic metabolic panel   Hemoglobin A1c   Urinalysis, Routine w reflex microscopic     Other   Hyperlipidemia   Relevant Medications   olmesartan-hydrochlorothiazide (BENICAR HCT) 40-12.5 MG tablet    Return in about 3 months (around 08/19/2022) for Reassessment.   Haydee Salter, MD

## 2022-05-26 ENCOUNTER — Encounter: Payer: Self-pay | Admitting: Family Medicine

## 2022-06-02 ENCOUNTER — Encounter: Payer: Self-pay | Admitting: Family Medicine

## 2022-06-02 ENCOUNTER — Ambulatory Visit (INDEPENDENT_AMBULATORY_CARE_PROVIDER_SITE_OTHER): Payer: Managed Care, Other (non HMO) | Admitting: Family Medicine

## 2022-06-02 VITALS — BP 122/80 | HR 77 | Temp 97.8°F | Ht 59.0 in | Wt 186.8 lb

## 2022-06-02 DIAGNOSIS — U099 Post covid-19 condition, unspecified: Secondary | ICD-10-CM | POA: Diagnosis not present

## 2022-06-02 DIAGNOSIS — R053 Chronic cough: Secondary | ICD-10-CM | POA: Diagnosis not present

## 2022-06-02 NOTE — Progress Notes (Signed)
[facility] [address] [place] [address] Dept: [phone] Dept Fax: [phone]  Office Visit  Subjective:    Patient ID: [name], female    DOB: 10-16-1965, 57 y.o..   MRN: [id number]  Chief Complaint  Patient presents with   Cough    C/o having a persistent cough(dry).     History of Present Illness:  Patient is in today complaining of ongoing dry cough. ms. Kado notes this started about a month ago, when she had COVID. She has had other symptoms of illness resolve, except for her cough. She has been using Hall's cough drops. She is not a smoker.  Past Medical History: Patient Active Problem List   Diagnosis Date Noted   COVID 04/10/2022   Adhesive capsulitis of left shoulder 01/31/2022   Depression with anxiety 09/27/2020   Migraine headache 09/27/2020   Insomnia 09/27/2020   Thyroid nodule 09/27/2020   Class 2 obesity due to excess calories with body mass index (BMI) of 37.0 to 37.9 in adult 03/09/2019   Acute right-sided low back pain with right-sided sciatica 06/10/2017   Trochanteric bursitis, right hip 05/27/2017   Atypical chest pain 11/18/2012   Type 2 diabetes mellitus 11/18/2012   Essential hypertension 11/18/2012   Hyperlipidemia    Past Surgical History:  Procedure Laterality Date   COMBINED HYSTEROSCOPY DIAGNOSTIC / D&C  yrs ago   [place]   with laser adhesions   DILITATION & CURRETTAGE/HYSTROSCOPY WITH NOVASURE ABLATION N/A 11/04/2019   Procedure: DILATATION & CURETTAGE/HYSTEROSCOPY WITH NOVASURE ABLATION;  Surgeon: Joseph Pierini, MD;  Location: [facility];  Service: Gynecology;  Laterality: N/A;   INTRAUTERINE DEVICE INSERTION     mirena-Inserted 05-16-14   KNEE SURGERY Left yrs ago   meniscurs tear repair   mirena removed  2021   OOPHORECTOMY  2008   left   ROTATOR CUFF REPAIR Right yrs ago   WRIST SURGERY Right    gang. cyst    Family History  Problem Relation Age of Onset   Hypertension Mother    Diabetes Mother    COPD Mother    Cancer Mother        Lung   Hypertension Father    Stroke Father 74   Diabetes Sister    Hypertension Brother    Diabetes Maternal Aunt    Cancer Maternal Aunt        Kidney   Kidney disease Maternal Aunt    Diabetes Maternal Aunt    Stroke Maternal Grandfather    Diabetes Maternal Grandfather    Colon cancer Neg Hx    Esophageal cancer Neg Hx    Rectal cancer Neg Hx    Stomach cancer Neg Hx    Outpatient Medications Prior to Visit  Medication Sig Dispense Refill   Ascorbic Acid (VITAMIN C) 1000 MG tablet Take 1,000 mg by mouth daily.     cholecalciferol (VITAMIN D3) 25 MCG (1000 UNIT) tablet Take 1,000 Units by mouth daily.     clonazePAM (KLONOPIN) 0.5 MG tablet Take 1 tablet (0.5 mg total) by mouth 2 (two) times daily as needed for anxiety. 30 tablet 1   ibuprofen (ADVIL) 800 MG tablet TAKE 1 TABLET 3 TIMES A DAY BY ORAL ROUTE.     metFORMIN (GLUCOPHAGE) 500 MG tablet Take 1 tablet (500 mg total) by mouth daily with breakfast. 90 tablet 3   olmesartan-hydrochlorothiazide (BENICAR HCT) 40-12.5 MG tablet Take 1 tablet by mouth  daily. 90 tablet 3   PARoxetine (PAXIL) 20 MG tablet TAKE 1 TABLET BY MOUTH EVERY DAY 90 tablet 3   rosuvastatin (CRESTOR) 10 MG tablet Take 1 tablet (10 mg total) by mouth daily. 90 tablet 3   sitaGLIPtin (JANUVIA) 100 MG tablet TAKE 1 TABLET BY MOUTH DAILY AFTER BREAKFAST. 90 tablet 3   zinc gluconate 50 MG tablet Take 50 mg by mouth daily.     No facility-administered medications prior to visit.   Allergies  Allergen Reactions   Dilaudid [Hydromorphone Hcl] Other (See Comments)    Broke in sweat and started shaking, can take oral   Sulfa Antibiotics Hives   Tylox [Oxycodone-Acetaminophen] Nausea And Vomiting   Clindamycin/Lincomycin Rash    hives     Objective:   Today's Vitals   06/02/22 1105  BP: 122/80  Pulse: 77  Temp: 97.8 F  (36.6 C)  TempSrc: Temporal  SpO2: 98%  Weight: 186 lb 12.8 oz (84.7 kg)  Height: 4\' 11"  (1.499 m)   Body mass index is 37.73 kg/m.   General: Well developed, well nourished. No acute distress. HEENT: Normocephalic, non-traumatic. PERRL, EOMI. Conjunctiva clear. Mucous membranes moist.   Oropharynx clear. Good dentition. Neck: Supple. No lymphadenopathy. No thyromegaly. Lungs: Clear to auscultation bilaterally. No wheezing, rales or rhonchi. CV: RRR without murmurs or rubs. Pulses 2+ bilaterally. Psych: Alert and oriented. Normal mood and affect.  Health Maintenance Due  Topic Date Due   OPHTHALMOLOGY EXAM  06/02/2022     Assessment & Plan:   Problem List Items Addressed This Visit       Other   Post-COVID chronic cough - Primary    Discussed natural course of a post-covid cough. Recommend avoidance of menthol-contianing cough drops. Use hot tea with honey instead. Will likely resolve within 3 months.        Return if symptoms worsen or fail to improve.   Haydee Salter, MD

## 2022-06-02 NOTE — Assessment & Plan Note (Signed)
Discussed natural course of a post-covid cough. Recommend avoidance of menthol-contianing cough drops. Use hot tea with honey instead. Will likely resolve within 3 months.

## 2022-06-03 NOTE — Progress Notes (Unsigned)
Name: [name]  MRN/ DOB: [id number], 06/20/1965    Age/ Sex: 57 y.o., female     PCP: Isabel Salter, MD   Reason for Endocrinology Evaluation: Left thyroid nodule      Initial Endocrinology Clinic Visit: 05/21/2020    PATIENT IDENTIFIER: [name] is a 57 y.o., female with a past medical history of  T2DM and dyslipidemia. She has followed with [facility] since 05/21/2020 for consultative assistance with management of her left thyroid nodule .   HISTORICAL SUMMARY: She was diagnosed with left thyroid nodule on ultrasound 05/2020. She is S/P FNA of the left mid nodule 1.5 cm on 06/05/2020 with benign cytology, but scan cellularity , repeat ultrasound 05/2021 showed decrease in size from 1.5 to 1.1 cm   She was having anxiety and palpitations around 2020 when her mother passed.    NO FH of thyroid disease      SUBJECTIVE:    Today (06/03/2022):  [name] is here for a follow up on left thyroid nodule.     Weight has been stable  Denies local neck swelling, denies pain  Denies dysphagia  Has "funny feeling in her chest" ,but  palpitations  Denies loose stools or diarrhea  Denies tremors   HISTORY:  Past Medical History:  Past Medical History:  Diagnosis Date   Abnormal uterine bleeding (AUB)    Anxiety    Arthritis    Diabetes mellitus    Elevated cholesterol    Endometrial polyp    Endometrial polyp    Endometriosis    Frozen shoulder    Hypertension    Migraines    Nodule    Ovarian cyst    Past Surgical History:  Past Surgical History:  Procedure Laterality Date   COMBINED HYSTEROSCOPY DIAGNOSTIC / D&C  yrs ago   [place]   with laser adhesions   DILITATION & CURRETTAGE/HYSTROSCOPY WITH NOVASURE ABLATION N/A 11/04/2019   Procedure: DILATATION & CURETTAGE/HYSTEROSCOPY WITH NOVASURE ABLATION;  Surgeon: Joseph Pierini, MD;  Location: [facility];  Service: Gynecology;   Laterality: N/A;   INTRAUTERINE DEVICE INSERTION     mirena-Inserted 05-16-14   KNEE SURGERY Left yrs ago   meniscurs tear repair   mirena removed  2021   OOPHORECTOMY  2008   left   ROTATOR CUFF REPAIR Right yrs ago   WRIST SURGERY Right    gang. cyst   Social History:  reports that she has never smoked. She has never used smokeless tobacco. She reports that she does not currently use alcohol. She reports that she does not use drugs. Family History:  Family History  Problem Relation Age of Onset   Hypertension Mother    Diabetes Mother    COPD Mother    Cancer Mother        Lung   Hypertension Father    Stroke Father 18   Diabetes Sister    Hypertension Brother    Diabetes Maternal Aunt    Cancer Maternal Aunt        Kidney   Kidney disease Maternal Aunt    Diabetes Maternal Aunt    Stroke Maternal Grandfather    Diabetes Maternal Grandfather    Colon cancer Neg Hx    Esophageal cancer Neg Hx    Rectal cancer Neg Hx    Stomach cancer Neg Hx      HOME MEDICATIONS: Allergies as of 06/04/2022  Reactions   Dilaudid [hydromorphone Hcl] Other (See Comments)   Broke in sweat and started shaking, can take oral   Sulfa Antibiotics Hives   Tylox [oxycodone-acetaminophen] Nausea And Vomiting   Clindamycin/lincomycin Rash   hives        Medication List        Accurate as of June 03, 2022  4:55 PM. If you have any questions, ask your nurse or doctor.          cholecalciferol 25 MCG (1000 UNIT) tablet Commonly known as: VITAMIN D3 Take 1,000 Units by mouth daily.   clonazePAM 0.5 MG tablet Commonly known as: KLONOPIN Take 1 tablet (0.5 mg total) by mouth 2 (two) times daily as needed for anxiety.   ibuprofen 800 MG tablet Commonly known as: ADVIL TAKE 1 TABLET 3 TIMES A DAY BY ORAL ROUTE.   Januvia 100 MG tablet Generic drug: sitaGLIPtin TAKE 1 TABLET BY MOUTH DAILY AFTER BREAKFAST.   metFORMIN 500 MG tablet Commonly known as: GLUCOPHAGE Take 1  tablet (500 mg total) by mouth daily with breakfast.   olmesartan-hydrochlorothiazide 40-12.5 MG tablet Commonly known as: BENICAR HCT Take 1 tablet by mouth daily.   PARoxetine 20 MG tablet Commonly known as: PAXIL TAKE 1 TABLET BY MOUTH EVERY DAY   rosuvastatin 10 MG tablet Commonly known as: CRESTOR Take 1 tablet (10 mg total) by mouth daily.   vitamin C 1000 MG tablet Take 1,000 mg by mouth daily.   zinc gluconate 50 MG tablet Take 50 mg by mouth daily.          OBJECTIVE:   PHYSICAL EXAM: VS: LMP  (LMP Unknown)    EXAM: General: Pt appears well and is in NAD  Neck: General: Supple without adenopathy. Thyroid: Thyroid size normal. Left isthmic nodule palpated.   Heart: RRR  Chest : CTA  Abdomen: Soft, non tender   Mental Status: Judgment, insight: Intact Memory: Intact for recent and remote events Mood and affect: No depression, anxiety, or agitation     DATA REVIEWED: Results for Isabel, Vasquez (MRN [id number]) as of 11/26/2020 11:57  Ref. Range 11/26/2020 08:03  TSH Latest Ref Range: 0.35 - 5.50 uIU/mL 1.61  Triiodothyronine,Free,Serum Latest Ref Range: 2.3 - 4.2 pg/mL 2.9  T4,Free(Direct) Latest Ref Range: 0.60 - 1.60 ng/dL 0.80     Thyroid Ultrasound 05/17/2021 Estimated total number of nodules >/= 1 cm: 1   Number of spongiform nodules >/=  2 cm not described below (TR1): 0   Number of mixed cystic and solid nodules >/= 1.5 cm not described below ([place]): 0   _________________________________________________________   Nodule within the left thyroid, decreased in size from 1.5 cm to 1.1 cm, previously biopsied. Assuming benign result, no further specific follow-up would be indicated.   No adenopathy   Recommendations follow those established by the new ACR TI-RADS criteria (J Am Coll Radiol 2017;14:587-595).   IMPRESSION: Decreasing size of left-sided thyroid nodule, which has had prior biopsy. Assuming benign result, no further  specific follow-up would be indicated.   FNA 06/05/2020  FINAL MICROSCOPIC DIAGNOSIS:  - Consistent with benign follicular nodule ([employer]hesda category II)   ASSESSMENT / PLAN / RECOMMENDATIONS:   Left thyroid Nodule :  -No Local neck symptoms  - Pt is clinically euthyroid  - S/P Benign FNA in 06/2020 - Will repeat thyroid ultrasound 05/2022 -Patient will return for TFT check  F/U in 1 yr   Signed electronically by: Mack Guise, MD  [facility]  [facility] [address], [place], [place] [address] Phone: [phone] FAX: [phone]      CC: [name], [place] [address] Phone: [phone]  Fax: [phone]   Return to Endocrinology clinic as below: Future Appointments  Date Time Provider [place]  06/04/2022  7:30 AM Rosey Eide, Melanie Crazier, MD [id number] None  08/19/2022  8:00 AM Gena Fray Lillette Boxer, MD [facility]

## 2022-06-04 ENCOUNTER — Ambulatory Visit (INDEPENDENT_AMBULATORY_CARE_PROVIDER_SITE_OTHER): Payer: Managed Care, Other (non HMO) | Admitting: Internal Medicine

## 2022-06-04 ENCOUNTER — Encounter: Payer: Self-pay | Admitting: Internal Medicine

## 2022-06-04 VITALS — BP 122/80 | HR 91 | Wt 186.0 lb

## 2022-06-04 DIAGNOSIS — E041 Nontoxic single thyroid nodule: Secondary | ICD-10-CM | POA: Diagnosis not present

## 2022-06-04 LAB — TSH: TSH: 2.51 u[IU]/mL (ref 0.35–5.50)

## 2022-06-04 LAB — T4, FREE: Free T4: 0.91 ng/dL (ref 0.60–1.60)

## 2022-06-19 NOTE — Progress Notes (Signed)
Cardiology Office Note:    Date:  06/23/2022   ID:  [name], DOB 01/07/1966, MRN [id number]  PCP:  Loyola Mast, MD   [facility] Providers Cardiologist:  Reatha Harps, MD Cardiology APP:  Marcelino Duster, [place] { Referring MD: Loyola Mast, MD   Chief Complaint  Patient presents with   Follow-up    Atypical chest pain    History of Present Illness:    [name] is a 57 y.o. female with a hx of DM, hypertension, hyperlipidemia, obesity, and anxiety.  She was seen in the ER 02/07/2020 for chest pain.  She ruled out with negative troponins and nonischemic EKG.  She reported chest pain in the setting of her mother dying.  She was seen back for recurrent chest pain and underwent CT coronary that showed coronary calcium score of 0, no CAD.  She has not been seen since 2022.  She had COVID in February and did not require hospitalization.   She was added to my scheduled for routine annual follow-up.  She is having issues with her left shoulder - bone on bone, frozen shoulder.  She is able to complete METS without angina - can walk at least 15 min on treadmill limited by hip pain, walks [employer] and grocery store, moderate house chores. She does not have exertional chest pain with these activities.    Past Medical History:  Diagnosis Date   Abnormal uterine bleeding (AUB)    Anxiety    Arthritis    Diabetes mellitus    Elevated cholesterol    Endometrial polyp    Endometrial polyp    Endometriosis    Frozen shoulder    Hypertension    Migraines    Nodule    Ovarian cyst     Past Surgical History:  Procedure Laterality Date   COMBINED HYSTEROSCOPY DIAGNOSTIC / D&C  yrs ago   DIAGNOSTIC LAPAROSCOPY  1993   with laser adhesions   DILITATION & CURRETTAGE/HYSTROSCOPY WITH NOVASURE ABLATION N/A 11/04/2019   Procedure: DILATATION & CURETTAGE/HYSTEROSCOPY WITH NOVASURE ABLATION;  Surgeon: Theresia Majors, MD;  Location: [facility];  Service: Gynecology;  Laterality: N/A;   INTRAUTERINE DEVICE INSERTION     mirena-Inserted 05-16-14   KNEE SURGERY Left yrs ago   meniscurs tear repair   mirena removed  2021   OOPHORECTOMY  2008   left   ROTATOR CUFF REPAIR Right yrs ago   WRIST SURGERY Right    gang. cyst    Current Medications: Current Meds  Medication Sig   Ascorbic Acid (VITAMIN C) 1000 MG tablet Take 1,000 mg by mouth daily.   cholecalciferol (VITAMIN D3) 25 MCG (1000 UNIT) tablet Take 1,000 Units by mouth daily.   clonazePAM (KLONOPIN) 0.5 MG tablet Take 1 tablet (0.5 mg total) by mouth 2 (two) times daily as needed for anxiety.   ibuprofen (ADVIL) 800 MG tablet as needed.   metFORMIN (GLUCOPHAGE) 500 MG tablet Take 1 tablet (500 mg total) by mouth daily with breakfast.   olmesartan-hydrochlorothiazide (BENICAR HCT) 40-12.5 MG tablet Take 1 tablet by mouth daily.   PARoxetine (PAXIL) 20 MG tablet TAKE 1 TABLET BY MOUTH EVERY DAY (Patient taking differently: Take 20 mg by mouth as needed.)   rosuvastatin (CRESTOR) 10 MG tablet Take 1 tablet (10 mg total) by mouth daily.   sitaGLIPtin (JANUVIA) 100 MG tablet TAKE 1 TABLET BY MOUTH DAILY AFTER BREAKFAST.   zinc gluconate 50 MG tablet  Take 50 mg by mouth daily.     Allergies:   Dilaudid [hydromorphone hcl], Sulfa antibiotics, Tylox [oxycodone-acetaminophen], and Clindamycin/lincomycin   Social History   Socioeconomic History   Marital status: Married    Spouse name: Not on file   Number of children: 1   Years of education: Not on file   Highest education level: Bachelor's degree (e.g., BA, AB, BS)  Occupational History   Not on file  Tobacco Use   Smoking status: Never   Smokeless tobacco: Never  Vaping Use   Vaping Use: Never used  Substance and Sexual Activity   Alcohol use: Not Currently   Drug use: No   Sexual activity: Yes    Birth control/protection: Post-menopausal  Other Topics Concern   Not on file  Social History  Narrative   Degree in business administration and focus in HR   Social Determinants of Health   Financial Resource Strain: Not on file  Food Insecurity: Not on file  Transportation Needs: Not on file  Physical Activity: Not on file  Stress: Not on file  Social Connections: Not on file     Family History: The patient's family history includes COPD in her mother; Cancer in her maternal aunt and mother; Diabetes in her maternal aunt, maternal aunt, maternal grandfather, mother, and sister; Hypertension in her brother, father, and mother; Kidney disease in her maternal aunt; Stroke in her maternal grandfather; Stroke (age of onset: 54) in her father. There is no history of Colon cancer, Esophageal cancer, Rectal cancer, or Stomach cancer.  ROS:   Please see the history of present illness.     All other systems reviewed and are negative.  EKGs/Labs/Other Studies Reviewed:    The following studies were reviewed today:  CT coronary 08/2020: IMPRESSION: 1. Coronary calcium score of 0.   2. Normal coronary origin with right dominance.   3. No evidence of CAD.  TTE 03/27/2020  1. Left ventricular ejection fraction, by estimation, is 65 to 70%. The  left ventricle has normal function. The left ventricle has no regional  wall motion abnormalities. Left ventricular diastolic parameters were  normal.   2. Right ventricular systolic function is normal. The right ventricular  size is normal.   3. The mitral valve is normal in structure. No evidence of mitral valve  regurgitation. No evidence of mitral stenosis.   4. The aortic valve was not well visualized. Aortic valve regurgitation  is not visualized. No aortic stenosis is present.   5. The inferior vena cava is normal in size with greater than 50%  respiratory variability, suggesting right atrial pressure of 3 mmHg.      EKG:  EKG is  ordered today.  The ekg ordered today demonstrates sinus rhythm with HR 77 left axis deviation, poor  R wave progression  Recent Labs: 05/19/2022: BUN 13; Creatinine, Ser 0.77; Potassium 3.6; Sodium 140 06/04/2022: TSH 2.51  Recent Lipid Panel    Component Value Date/Time   CHOL 155 02/11/2022 0841   CHOL 124 12/22/2018 0944   TRIG 51.0 02/11/2022 0841   HDL 75.10 02/11/2022 0841   HDL 58 12/22/2018 0944   CHOLHDL 2 02/11/2022 0841   VLDL 10.2 02/11/2022 0841   LDLCALC 70 02/11/2022 0841   LDLCALC 49 12/22/2018 0944     Risk Assessment/Calculations:                Physical Exam:    VS:  BP 132/82   Pulse 77   Ht  5' (1.524 m)   Wt 186 lb (84.4 kg)   LMP  (LMP Unknown)   BMI 36.33 kg/m     Wt Readings from Last 3 Encounters:  06/23/22 186 lb (84.4 kg)  06/04/22 186 lb (84.4 kg)  06/02/22 186 lb 12.8 oz (84.7 kg)     GEN:  Well nourished, well developed in no acute distress HEENT: Normal NECK: No JVD; No carotid bruits LYMPHATICS: No lymphadenopathy CARDIAC: RRR, no murmurs, rubs, gallops RESPIRATORY:  Clear to auscultation without rales, wheezing or rhonchi  ABDOMEN: Soft, non-tender, non-distended MUSCULOSKELETAL:  No edema; No deformity  SKIN: Warm and dry NEUROLOGIC:  Alert and oriented x 3 PSYCHIATRIC:  Normal affect   ASSESSMENT:    1. Chest pain, unspecified type   2. Primary hypertension   3. Hyperlipidemia, unspecified hyperlipidemia type   4. Type 2 diabetes mellitus without complication, without long-term current use of insulin   5. Preoperative clearance    PLAN:    In order of problems listed above:  Atypical chest pain Reassuring CT coronary in 2022 Suspect her chest discomfort that radiates from her left shoulder is MSK in origin   Hypertension Maintained on olmesartan-HCTZ 40-12.5 mg daily BP well controlled   Hyperlipidemia with LDL goal less than 100 Maintained on 10 mg Crestor 02/11/2022: Cholesterol 155; HDL 75.10; LDL Cholesterol 70; Triglycerides 51.0; VLDL 10.2   DM 500 mg metformin, Januvia Recent A1c  6.6%   Preoperative risk evaluation Should she need shoulder surgery, she would be low risk for this procedure.  She can complete 4.0 METS without angina.  Suspect her atypical chest pain is likely MSK in origin, radiating from her left shoulder.  She has no unstable cardiac conditions.  She may proceed with surgery without further cardiac testing.   Follow-up with Dr. Flora Lipps in approximately 1 year, sooner if needed.           Medication Adjustments/Labs and Tests Ordered: Current medicines are reviewed at length with the patient today.  Concerns regarding medicines are outlined above.  No orders of the defined types were placed in this encounter.  No orders of the defined types were placed in this encounter.   Patient Instructions  Medication Instructions:  Your physician recommends that you continue on your current medications as directed. Please refer to the Current Medication list given to you today.  *If you need a refill on your cardiac medications before your next appointment, please call your pharmacy*  Follow-Up: At [facility], you and your health needs are our priority.  As part of our continuing mission to provide you with exceptional heart care, we have created designated Provider Care Teams.  These Care Teams include your primary Cardiologist (physician) and Advanced Practice Providers (APPs -  Physician Assistants and Nurse Practitioners) who all work together to provide you with the care you need, when you need it.  We recommend signing up for the patient portal called "MyChart".  Sign up information is provided on this After Visit Summary.  MyChart is used to connect with patients for Virtual Visits (Telemedicine).  Patients are able to view lab/test results, encounter notes, upcoming appointments, etc.  Non-urgent messages can be sent to your provider as well.   To learn more about what you can do with MyChart, go to [email or web address].    Your next  appointment:   12 month(s)  Provider:   Dr. Flora Lipps    Signed, Katelynne [place], [place]  06/23/2022 8:56 AM  [place]

## 2022-06-20 ENCOUNTER — Ambulatory Visit
Admission: RE | Admit: 2022-06-20 | Discharge: 2022-06-20 | Disposition: A | Discharge status: Home / Self Care | Payer: Managed Care, Other (non HMO) | Source: Ambulatory Visit | Attending: Internal Medicine | Admitting: Internal Medicine

## 2022-06-20 DIAGNOSIS — E041 Nontoxic single thyroid nodule: Secondary | ICD-10-CM

## 2022-06-23 ENCOUNTER — Ambulatory Visit: Payer: Managed Care, Other (non HMO) | Attending: Physician Assistant | Admitting: Physician Assistant

## 2022-06-23 ENCOUNTER — Telehealth: Payer: Self-pay | Admitting: Internal Medicine

## 2022-06-23 ENCOUNTER — Encounter: Payer: Self-pay | Admitting: Physician Assistant

## 2022-06-23 VITALS — BP 132/82 | HR 77 | Ht 60.0 in | Wt 186.0 lb

## 2022-06-23 DIAGNOSIS — R079 Chest pain, unspecified: Secondary | ICD-10-CM

## 2022-06-23 DIAGNOSIS — E785 Hyperlipidemia, unspecified: Secondary | ICD-10-CM | POA: Diagnosis not present

## 2022-06-23 DIAGNOSIS — I10 Essential (primary) hypertension: Secondary | ICD-10-CM

## 2022-06-23 DIAGNOSIS — E119 Type 2 diabetes mellitus without complications: Secondary | ICD-10-CM

## 2022-06-23 DIAGNOSIS — Z01818 Encounter for other preprocedural examination: Secondary | ICD-10-CM

## 2022-06-23 NOTE — Telephone Encounter (Signed)
Thyroid ultrasound reviewed 06/23/2022   Current measurements with a left thyroid nodule 0.98x0.71x1.35  (previously 0.98x 0.75x 1.14)   There is less than 20% increase in 1 dimension, the other 2 dimensions remained either stable or decreased   No intervention is needed at this time  Abby Raelyn Mora, MD  [facility]  [facility] [address] [place], [place] [address] Phone: [phone] FAX: [phone]

## 2022-06-23 NOTE — Patient Instructions (Signed)
Medication Instructions:  Your physician recommends that you continue on your current medications as directed. Please refer to the Current Medication list given to you today.  *If you need a refill on your cardiac medications before your next appointment, please call your pharmacy*  Follow-Up: At [facility], you and your health needs are our priority.  As part of our continuing mission to provide you with exceptional heart care, we have created designated Provider Care Teams.  These Care Teams include your primary Cardiologist (physician) and Advanced Practice Providers (APPs -  Physician Assistants and Nurse Practitioners) who all work together to provide you with the care you need, when you need it.  We recommend signing up for the patient portal called "MyChart".  Sign up information is provided on this After Visit Summary.  MyChart is used to connect with patients for Virtual Visits (Telemedicine).  Patients are able to view lab/test results, encounter notes, upcoming appointments, etc.  Non-urgent messages can be sent to your provider as well.   To learn more about what you can do with MyChart, go to [email or web address].    Your next appointment:   12 month(s)  Provider:   Dr. Flora Lipps

## 2022-06-25 ENCOUNTER — Encounter: Payer: Self-pay | Admitting: Family Medicine

## 2022-06-25 DIAGNOSIS — U099 Post covid-19 condition, unspecified: Secondary | ICD-10-CM

## 2022-06-25 MED ORDER — BENZONATATE 200 MG PO CAPS: 200.0000 mg | ORAL_CAPSULE | Freq: Two times a day (BID) | ORAL | 0 refills | Status: DC | PRN

## 2022-06-27 NOTE — Addendum Note (Signed)
Addended by: Dorris Fetch on: 06/27/2022 08:38 AM   Modules accepted: Orders

## 2022-07-18 DIAGNOSIS — M75110 Incomplete rotator cuff tear or rupture of unspecified shoulder, not specified as traumatic: Secondary | ICD-10-CM | POA: Insufficient documentation

## 2022-08-16 ENCOUNTER — Ambulatory Visit
Admission: EM | Admit: 2022-08-16 | Discharge: 2022-08-16 | Disposition: A | Discharge status: Home / Self Care | Payer: Managed Care, Other (non HMO) | Attending: Internal Medicine | Admitting: Internal Medicine

## 2022-08-16 DIAGNOSIS — B029 Zoster without complications: Secondary | ICD-10-CM

## 2022-08-16 MED ORDER — VALACYCLOVIR HCL 1 G PO TABS: 1000.0000 mg | ORAL_TABLET | Freq: Three times a day (TID) | ORAL | 0 refills | Status: AC

## 2022-08-16 NOTE — Discharge Instructions (Signed)
You have shingles which is being treated with any antiviral medication.  Follow-up if any symptoms persist or worsen.

## 2022-08-16 NOTE — ED Triage Notes (Signed)
Pt states that she has a insect bite on her left arm. X2 days

## 2022-08-16 NOTE — ED Provider Notes (Signed)
[name]    CSN: [id number] Arrival date & time: 08/16/22  1241      History   Chief Complaint Chief Complaint  Patient presents with   Insect Bite    Insect bite left arm    HPI [name] is a 57 y.o. female.   Patient presents with a rash to left anterior shoulder that she noticed 2 days ago.  Patient was originally concerned for insect bite but reports that she did not notice or visualize anything bite her.  Denies any fever or drainage from the area.  She reports that she has been using topical medications with minimal improvement.     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   Depression with anxiety 09/27/2020   Migraine headache 09/27/2020   Insomnia 09/27/2020   Thyroid nodule 09/27/2020   Class 2 obesity due to excess calories with body mass index (BMI) of 37.0 to 37.9 in adult 03/09/2019   Acute right-sided low back pain with right-sided sciatica 06/10/2017   Trochanteric bursitis, right hip 05/27/2017   Atypical chest pain 11/18/2012   Type 2 diabetes mellitus (HCC) 11/18/2012   Essential hypertension 11/18/2012   Hyperlipidemia     Past Surgical History:  Procedure Laterality Date   COMBINED HYSTEROSCOPY DIAGNOSTIC / D&C  yrs ago   DIAGNOSTIC LAPAROSCOPY  1993   with laser adhesions   DILITATION & CURRETTAGE/HYSTROSCOPY WITH NOVASURE ABLATION N/A 11/04/2019   Procedure: DILATATION & CURETTAGE/HYSTEROSCOPY WITH NOVASURE ABLATION;  Surgeon: Theresia Majors, MD;  Location: [facility];  Service: Gynecology;   Laterality: N/A;   INTRAUTERINE DEVICE INSERTION     mirena-Inserted 05-16-14   KNEE SURGERY Left yrs ago   meniscurs tear repair   mirena removed  2021   OOPHORECTOMY  2008   left   ROTATOR CUFF REPAIR Right yrs ago   WRIST SURGERY Right    gang. cyst    OB History     Gravida  2   Para  1   Term  1   Preterm      AB  1   Living  1      SAB      IAB      Ectopic      Multiple      Live Births               Home Medications    Prior to Admission medications   Medication Sig Start Date End Date Taking? Authorizing Provider  Ascorbic Acid (VITAMIN C) 1000 MG tablet Take 1,000 mg by mouth daily.   Yes [provider]  benzonatate (TESSALON) 200 MG capsule Take 1 capsule (200 mg total) by mouth 2 (two) times daily as needed for cough. 06/25/22  Yes Loyola Mast, MD  cholecalciferol (VITAMIN D3) 25 MCG (1000 UNIT) tablet Take 1,000 Units by mouth daily.   Yes [provider]  clonazePAM (KLONOPIN) 0.5 MG tablet Take 1  tablet (0.5 mg total) by mouth 2 (two) times daily as needed for anxiety. 01/16/22  Yes Mliss Sax, MD  ibuprofen (ADVIL) 800 MG tablet as needed. 01/31/22  Yes [provider]  metFORMIN (GLUCOPHAGE) 500 MG tablet Take 1 tablet (500 mg total) by mouth daily with breakfast. 11/13/21  Yes Loyola Mast, MD  olmesartan-hydrochlorothiazide (BENICAR HCT) 40-12.5 MG tablet Take 1 tablet by mouth daily. 05/19/22  Yes Loyola Mast, MD  PARoxetine (PAXIL) 20 MG tablet TAKE 1 TABLET BY MOUTH EVERY DAY Patient taking differently: Take 20 mg by mouth as needed. 05/02/22  Yes Loyola Mast, MD  rosuvastatin (CRESTOR) 10 MG tablet Take 1 tablet (10 mg total) by mouth daily. 11/12/21  Yes Loyola Mast, MD  sitaGLIPtin (JANUVIA) 100 MG tablet TAKE 1 TABLET BY MOUTH DAILY AFTER BREAKFAST. 03/10/22  Yes Loyola Mast, MD  valACYclovir (VALTREX) 1000 MG tablet Take 1 tablet (1,000 mg total) by mouth 3 (three) times daily  for 7 days. 08/16/22 08/23/22 Yes , Acie Fredrickson, FNP  zinc gluconate 50 MG tablet Take 50 mg by mouth daily.   Yes [provider]    Family History Family History  Problem Relation Age of Onset   Hypertension Mother    Diabetes Mother    COPD Mother    Cancer Mother        Lung   Hypertension Father    Stroke Father 25   Diabetes Sister    Hypertension Brother    Diabetes Maternal Aunt    Cancer Maternal Aunt        Kidney   Kidney disease Maternal Aunt    Diabetes Maternal Aunt    Stroke Maternal Grandfather    Diabetes Maternal Grandfather    Colon cancer Neg Hx    Esophageal cancer Neg Hx    Rectal cancer Neg Hx    Stomach cancer Neg Hx     Social History Social History   Tobacco Use   Smoking status: Never   Smokeless tobacco: Never  Vaping Use   Vaping Use: Never used  Substance Use Topics   Alcohol use: Not Currently   Drug use: No     Allergies   Dilaudid [hydromorphone hcl], Sulfa antibiotics, Tylox [oxycodone-acetaminophen], and Clindamycin/lincomycin   Review of Systems Review of Systems Per HPI  Physical Exam Triage Vital Signs ED Triage Vitals  Enc Vitals Group     BP --      Pulse --      Resp --      Temp --      Temp src --      SpO2 --      Weight 08/16/22 1301 186 lb (84.4 kg)     Height 08/16/22 1301 4\' 11"  (1.499 m)     Head Circumference --      Peak Flow --      Pain Score 08/16/22 1300 0     Pain Loc --      Pain Edu? --      Excl. in GC? --    No data found.  Updated Vital Signs BP 127/81 (BP Location: Left Arm)   Pulse 93   Temp 98 F (36.7 C) (Oral)   Resp 17   Ht 4\' 11"  (1.499 m)   Wt 186 lb (84.4 kg)   LMP  (LMP Unknown)   SpO2 97%   BMI 37.57 kg/m   Visual Acuity Right Eye Distance:   Left  Eye Distance:   Bilateral Distance:    Right Eye Near:   Left Eye Near:    Bilateral Near:     Physical Exam Constitutional:      General: She is not in acute distress.    Appearance: Normal  appearance. She is not toxic-appearing or diaphoretic.  HENT:     Head: Normocephalic and atraumatic.  Eyes:     Extraocular Movements: Extraocular movements intact.     Conjunctiva/sclera: Conjunctivae normal.  Pulmonary:     Effort: Pulmonary effort is normal.  Skin:    Comments: She has a very small cluster that is approximately 1 cm in diameter of vesicular lesions that are mildly erythematous present to left anterior shoulder.  No drainage noted.  Neurological:     General: No focal deficit present.     Mental Status: She is alert and oriented to person, place, and time. Mental status is at baseline.  Psychiatric:        Mood and Affect: Mood normal.        Behavior: Behavior normal.        Thought Content: Thought content normal.        Judgment: Judgment normal.      UC Treatments / Results  Labs (all labs ordered are listed, but only abnormal results are displayed) Labs Reviewed - No data to display  EKG   Radiology No results found.  Procedures Procedures (including critical care time)  Medications Ordered in UC Medications - No data to display  Initial Impression / Assessment and Plan / UC Course  I have reviewed the triage vital signs and the nursing notes.  Pertinent labs & imaging results that were available during my care of the patient were reviewed by me and considered in my medical decision making (see chart for details).     Rash is consistent with herpes zoster.  Will treat with Valtrex.  Kidney function appears normal so no dosage adjustment necessary.  Advised her to follow-up if any symptoms persist or worsen.  Patient verbalized understanding and was agreeable with plan. Final Clinical Impressions(s) / UC Diagnoses   Final diagnoses:  Herpes zoster without complication     Discharge Instructions      You have shingles which is being treated with any antiviral medication.  Follow-up if any symptoms persist or worsen.     ED  Prescriptions     Medication Sig Dispense Auth. Provider   valACYclovir (VALTREX) 1000 MG tablet Take 1 tablet (1,000 mg total) by mouth 3 (three) times daily for 7 days. 21 tablet [place], Acie Fredrickson, [place]      PDMP not reviewed this encounter.   Gustavus Bryant, [place] 08/16/22 1329

## 2022-08-19 ENCOUNTER — Encounter: Payer: Self-pay | Admitting: Family Medicine

## 2022-08-19 ENCOUNTER — Ambulatory Visit (INDEPENDENT_AMBULATORY_CARE_PROVIDER_SITE_OTHER): Payer: Managed Care, Other (non HMO) | Admitting: Family Medicine

## 2022-08-19 VITALS — BP 130/82 | HR 89 | Temp 97.5°F | Ht 59.0 in | Wt 187.2 lb

## 2022-08-19 DIAGNOSIS — W57XXXD Bitten or stung by nonvenomous insect and other nonvenomous arthropods, subsequent encounter: Secondary | ICD-10-CM

## 2022-08-19 DIAGNOSIS — S40862A Insect bite (nonvenomous) of left upper arm, initial encounter: Secondary | ICD-10-CM | POA: Insufficient documentation

## 2022-08-19 DIAGNOSIS — Z7984 Long term (current) use of oral hypoglycemic drugs: Secondary | ICD-10-CM

## 2022-08-19 DIAGNOSIS — U099 Post covid-19 condition, unspecified: Secondary | ICD-10-CM

## 2022-08-19 DIAGNOSIS — M7502 Adhesive capsulitis of left shoulder: Secondary | ICD-10-CM

## 2022-08-19 DIAGNOSIS — S40862D Insect bite (nonvenomous) of left upper arm, subsequent encounter: Secondary | ICD-10-CM

## 2022-08-19 DIAGNOSIS — I10 Essential (primary) hypertension: Secondary | ICD-10-CM

## 2022-08-19 DIAGNOSIS — E785 Hyperlipidemia, unspecified: Secondary | ICD-10-CM

## 2022-08-19 DIAGNOSIS — E119 Type 2 diabetes mellitus without complications: Secondary | ICD-10-CM

## 2022-08-19 LAB — HEMOGLOBIN A1C: Hgb A1c MFr Bld: 6.9 % — ABNORMAL HIGH (ref 4.6–6.5)

## 2022-08-19 LAB — GLUCOSE, RANDOM: Glucose, Bld: 123 mg/dL — ABNORMAL HIGH (ref 70–99)

## 2022-08-19 MED ORDER — TRIAMCINOLONE ACETONIDE 0.1 % EX CREA
1.0000 | TOPICAL_CREAM | Freq: Two times a day (BID) | CUTANEOUS | 0 refills | Status: DC
Start: 2022-08-19 — End: 2022-09-10

## 2022-08-19 MED ORDER — MUPIROCIN CALCIUM 2 % EX CREA
1.0000 | TOPICAL_CREAM | Freq: Two times a day (BID) | CUTANEOUS | 0 refills | Status: DC
Start: 2022-08-19 — End: 2022-09-10

## 2022-08-19 NOTE — Assessment & Plan Note (Signed)
Lipids at goal. Continue rosuvastatin 10 mg daily.

## 2022-08-19 NOTE — Assessment & Plan Note (Signed)
Scheduled for surgery on 09/19/2022.

## 2022-08-19 NOTE — Assessment & Plan Note (Signed)
We will check A1c today. Continue sitagliptin 100 mg daily and metformin 500 mg daily. Eye exam scheduled for next month.

## 2022-08-19 NOTE — Assessment & Plan Note (Signed)
Blood pressure is in adequate control. Continue Benicar HCT 40-12.5 mg daily.

## 2022-08-19 NOTE — Assessment & Plan Note (Signed)
The lesion is not consistent with herpes zoster. It appears to be an insect bite with some localized infection. I recommend she stop the valacyclovir. I will prescribe mupirocin for localized infection and TAC cream to reduce itching and inflammation.

## 2022-08-19 NOTE — Progress Notes (Signed)
[facility] [address] [place] [address] Dept: [phone] Dept Fax: [phone]  Chronic Care Office Visit  Subjective:    Patient ID: [name], female    DOB: June 09, 1965, 57 y.o..   MRN: [id number]  Chief Complaint  Patient presents with   Medical Management of Chronic Issues    3 month follow up per patient she would like shingles spots checked diagnosed at urgent care over the weekend. Patient fasting.    History of Present Illness:  Patient is in today for reassessment of chronic medical issues.  [name] has a history of hypertension, managed on Benicar HCT (olmesartan/HCTZ) 40-12.5 mg daily.   [name] has a history of Type 2 diabetes. She is managed on sitagliptin (Januvia) 100 mg daily and metformin 500 mg daily.   [name] has a history of hyperlipidemia and is managed on rosuvastatin 10 mg daily.   [name] has a history of  left shoulder partial rotator cuff tear and adhesive capsulitis. She is scheduled for next month to have surgery on this.  [name] was seen in UC several days ago with an acute rash on her left upper arm. She was diagnosed with shingles and prescribed valacyclovir. She notes the site has been pruritic and has had some mild burning sensation.  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   COVID 04/10/2022   Adhesive capsulitis of left shoulder 01/31/2022   Depression with anxiety 09/27/2020   Migraine headache 09/27/2020   Insomnia 09/27/2020   Thyroid nodule 09/27/2020   Class 2 obesity due to excess calories with body mass index (BMI) of 37.0 to 37.9 in adult 03/09/2019   Acute right-sided low back pain with right-sided sciatica 06/10/2017   Trochanteric bursitis, right hip 05/27/2017   Atypical chest pain 11/18/2012   Type 2  diabetes mellitus (HCC) 11/18/2012   Essential hypertension 11/18/2012   Hyperlipidemia    Past Surgical History:  Procedure Laterality Date   COMBINED HYSTEROSCOPY DIAGNOSTIC / D&C  yrs ago   DIAGNOSTIC LAPAROSCOPY  1993   with laser adhesions   DILITATION & CURRETTAGE/HYSTROSCOPY WITH NOVASURE ABLATION N/A 11/04/2019   Procedure: DILATATION & CURETTAGE/HYSTEROSCOPY WITH NOVASURE ABLATION;  Surgeon: Theresia Majors, MD;  Location: [facility];  Service: Gynecology;  Laterality: N/A;   INTRAUTERINE DEVICE INSERTION     mirena-Inserted 05-16-14   KNEE SURGERY Left yrs ago   meniscurs tear repair   mirena removed  2021   OOPHORECTOMY  2008   left   ROTATOR CUFF REPAIR Right yrs ago   WRIST SURGERY Right    gang. cyst   Family History  Problem Relation Age of Onset   Hypertension Mother    Diabetes Mother    COPD Mother    Cancer Mother        Lung   Hypertension Father    Stroke Father 25   Diabetes Sister    Hypertension Brother    Diabetes Maternal Aunt    Cancer Maternal Aunt        Kidney   Kidney disease Maternal Aunt    Diabetes Maternal Aunt    Stroke Maternal Grandfather    Diabetes Maternal Grandfather    Colon cancer Neg Hx    Esophageal cancer Neg Hx    Rectal cancer Neg Hx    Stomach cancer Neg  Hx    Outpatient Medications Prior to Visit  Medication Sig Dispense Refill   Ascorbic Acid (VITAMIN C) 1000 MG tablet Take 1,000 mg by mouth daily.     cholecalciferol (VITAMIN D3) 25 MCG (1000 UNIT) tablet Take 1,000 Units by mouth daily.     ibuprofen (ADVIL) 800 MG tablet as needed.     metFORMIN (GLUCOPHAGE) 500 MG tablet Take 1 tablet (500 mg total) by mouth daily with breakfast. 90 tablet 3   olmesartan-hydrochlorothiazide (BENICAR HCT) 40-12.5 MG tablet Take 1 tablet by mouth daily. 90 tablet 3   PARoxetine (PAXIL) 20 MG tablet TAKE 1 TABLET BY MOUTH EVERY DAY (Patient taking differently: Take 20 mg by mouth as needed.) 90 tablet 3    rosuvastatin (CRESTOR) 10 MG tablet Take 1 tablet (10 mg total) by mouth daily. 90 tablet 3   sitaGLIPtin (JANUVIA) 100 MG tablet TAKE 1 TABLET BY MOUTH DAILY AFTER BREAKFAST. 90 tablet 3   valACYclovir (VALTREX) 1000 MG tablet Take 1 tablet (1,000 mg total) by mouth 3 (three) times daily for 7 days. 21 tablet 0   zinc gluconate 50 MG tablet Take 50 mg by mouth daily.     clonazePAM (KLONOPIN) 0.5 MG tablet Take 1 tablet (0.5 mg total) by mouth 2 (two) times daily as needed for anxiety. (Patient not taking: Reported on 08/19/2022) 30 tablet 1   benzonatate (TESSALON) 200 MG capsule Take 1 capsule (200 mg total) by mouth 2 (two) times daily as needed for cough. 20 capsule 0   No facility-administered medications prior to visit.   Allergies  Allergen Reactions   Dilaudid [Hydromorphone Hcl] Other (See Comments)    Broke in sweat and started shaking, can take oral   Sulfa Antibiotics Hives   Tylox [Oxycodone-Acetaminophen] Nausea And Vomiting   Clindamycin/Lincomycin Rash    hives   Objective:   Today's Vitals   08/19/22 0809  BP: 130/82  Pulse: 89  Temp: (!) 97.5 F (36.4 C)  TempSrc: Temporal  SpO2: 97%  Weight: 187 lb 3.2 oz (84.9 kg)  Height: 4\' 11"  (1.499 m)   Body mass index is 37.81 kg/m.   General: Well developed, well nourished. No acute distress. Skin: Warm and dry. There is a 1 cm, well circumscribed area of maculopapular rash, with a few small pustular vesicles   within the area. There is a mild surrounding area of erythema with increased warmth. Psych: Alert and oriented. Normal mood and affect.  Health Maintenance Due  Topic Date Due   OPHTHALMOLOGY EXAM  06/02/2022     Assessment & Plan:   Problem List Items Addressed This Visit       Cardiovascular and Mediastinum   Essential hypertension - Primary    Blood pressure is in adequate control. Continue Benicar HCT 40-12.5 mg daily.        Endocrine   Type 2 diabetes mellitus (HCC)    We will check A1c  today. Continue sitagliptin 100 mg daily and metformin 500 mg daily. Eye exam scheduled for next month.      Relevant Orders   Glucose, random   Hemoglobin A1c     Musculoskeletal and Integument   Adhesive capsulitis of left shoulder    Scheduled for surgery on 09/19/2022.      Insect bite of left upper arm    The lesion is not consistent with herpes zoster. It appears to be an insect bite with some localized infection. I recommend she stop the valacyclovir. I will prescribe  mupirocin for localized infection and TAC cream to reduce itching and inflammation.      Relevant Medications   mupirocin cream (BACTROBAN) 2 %   triamcinolone cream (KENALOG) 0.1 %     Other   Hyperlipidemia    Lipids at goal. Continue rosuvastatin 10 mg daily.       Return in about 3 months (around 11/19/2022) for Annual preventative care.   Loyola Mast, MD

## 2022-09-10 ENCOUNTER — Encounter (HOSPITAL_BASED_OUTPATIENT_CLINIC_OR_DEPARTMENT_OTHER): Payer: Self-pay | Admitting: Orthopedic Surgery

## 2022-09-12 ENCOUNTER — Encounter (HOSPITAL_BASED_OUTPATIENT_CLINIC_OR_DEPARTMENT_OTHER)
Admission: RE | Admit: 2022-09-12 | Discharge: 2022-09-12 | Disposition: A | Discharge status: Home / Self Care | Payer: Managed Care, Other (non HMO) | Source: Ambulatory Visit | Attending: Orthopedic Surgery | Admitting: Orthopedic Surgery

## 2022-09-12 DIAGNOSIS — Z01812 Encounter for preprocedural laboratory examination: Secondary | ICD-10-CM | POA: Insufficient documentation

## 2022-09-12 LAB — BASIC METABOLIC PANEL
Anion gap: 9 (ref 5–15)
BUN: 11 mg/dL (ref 6–20)
CO2: 30 mmol/L (ref 22–32)
Calcium: 9.1 mg/dL (ref 8.9–10.3)
Chloride: 100 mmol/L (ref 98–111)
Creatinine, Ser: 0.68 mg/dL (ref 0.44–1.00)
GFR, Estimated: >60 mL/min (ref >60)
Glucose, Bld: 91 mg/dL (ref 70–99)
Potassium: 3.7 mmol/L (ref 3.5–5.1)
Sodium: 139 mmol/L (ref 135–145)

## 2022-09-12 NOTE — Progress Notes (Addendum)
   Enhanced Recovery after Surgery  Enhanced Recovery after Surgery is a protocol used to improve the stress on your body and your recovery after surgery.  Patient Instructions  The night before surgery:  No food after midnight. ONLY clear liquids after midnight  The day of surgery (if you do NOT have diabetes):  Drink ONE (1) Pre-Surgery Clear Ensure as directed.   This drink was given to you during your hospital  pre-op appointment visit. The pre-op nurse will instruct you on the time to drink the  Pre-Surgery Ensure depending on your surgery time. Finish the drink at the designated time by the pre-op nurse.  Nothing else to drink after completing the  Pre-Surgery Clear Ensure.  The day of surgery (if you have diabetes): Drink ONE (1) Gatorade 2 (G2) as directed. This drink was given to you during your hospital  pre-op appointment visit.  The pre-op nurse will instruct you on the time to drink the   Gatorade 2 (G2) depending on your surgery time. Color of the Gatorade may vary. Red is not allowed. Nothing else to drink after completing the  Gatorade 2 (G2).         If office.you have questions, please contact your surgeon's officeSurgical soap given with instructions, pt verbalized understanding. Benzoyl peroxide gel given with instructions, pt verbalized understanding.

## 2022-09-17 ENCOUNTER — Encounter (HOSPITAL_BASED_OUTPATIENT_CLINIC_OR_DEPARTMENT_OTHER): Payer: Self-pay | Admitting: Anesthesiology

## 2022-09-18 NOTE — Anesthesia Preprocedure Evaluation (Signed)
Anesthesia Evaluation    Reviewed: Allergy & Precautions, Patient's Chart, lab work & pertinent test results  Airway        Dental   Pulmonary           Cardiovascular hypertension, Pt. on medications      Neuro/Psych  Headaches  Anxiety Depression       GI/Hepatic   Endo/Other  diabetes, Type 2, Oral Hypoglycemic Agents    Renal/GU      Musculoskeletal  (+) Arthritis ,    Abdominal  (+) + obese  Peds  Hematology   Anesthesia Other Findings All: Dilauded, Sulfa, Tylox, Clindamycin  Reproductive/Obstetrics                              Anesthesia Physical Anesthesia Plan  ASA: 3  Anesthesia Plan: General and Regional   Post-op Pain Management: Regional block*, Minimal or no pain anticipated, Ofirmev IV (intra-op)* and Precedex   Induction: Intravenous  PONV Risk Score and Plan: 3 and Treatment may vary due to age or medical condition, Ondansetron, Dexamethasone and Midazolam  Airway Management Planned: Oral ETT  Additional Equipment: None  Intra-op Plan:   Post-operative Plan: Extubation in OR  Informed Consent:      Dental advisory given  Plan Discussed with:   Anesthesia Plan Comments: (GA wL ISB )         Anesthesia Quick Evaluation

## 2022-09-19 DIAGNOSIS — Z01818 Encounter for other preprocedural examination: Secondary | ICD-10-CM

## 2022-09-22 NOTE — Progress Notes (Signed)
Spoke w/ via phone for pre-op interview---pt Lab needs dos----    none           Lab results------bmp 09-14-2022 epic, EKG/cardiac clearance note [facility] po 4--22-2024 epic COVID test -----patient states asymptomatic no test needed Arrive at -------645 am 10-03-2022 NPO after MN NO Solid Food.  Clear liquids from MN until---545 am Med rec completed Medications to take morning of surgery -----anxiety medication prn Diabetic medication -----none day of surgery Patient instructed no nail polish to be worn day of surgery Patient instructed to bring photo id and insurance card day of surgery Patient aware to have Driver (ride ) / caregiver   husband todd  for 24 hours after surgery  Patient Special Instructions -----pt to use hibiclens soap per [facility] instrions Pre-Op special Instructions -----pt to use shoudler cream per [facility] instructions Patient verbalized understanding of instructions that were given at this phone interview. Patient denies shortness of breath, chest pain, fever, cough at this phone interview.

## 2022-10-02 ENCOUNTER — Telehealth: Payer: Self-pay

## 2022-10-02 DIAGNOSIS — Z1231 Encounter for screening mammogram for malignant neoplasm of breast: Secondary | ICD-10-CM

## 2022-10-02 NOTE — Telephone Encounter (Signed)
Scheduled pt for 3 mo FU appt with Dr. Sherrill Raring and MM on 11/17/22.  Pt states she has an eye doc appt scheduled. Asked pt to get those records sent over to [place] once completed.

## 2022-10-03 ENCOUNTER — Ambulatory Visit (HOSPITAL_BASED_OUTPATIENT_CLINIC_OR_DEPARTMENT_OTHER): Payer: Managed Care, Other (non HMO) | Admitting: Certified Registered"

## 2022-10-03 ENCOUNTER — Other Ambulatory Visit: Payer: Self-pay

## 2022-10-03 ENCOUNTER — Encounter (HOSPITAL_BASED_OUTPATIENT_CLINIC_OR_DEPARTMENT_OTHER): Payer: Self-pay | Admitting: Orthopedic Surgery

## 2022-10-03 ENCOUNTER — Encounter (HOSPITAL_BASED_OUTPATIENT_CLINIC_OR_DEPARTMENT_OTHER)
Admission: RE | Disposition: A | Discharge status: Home / Self Care | Payer: Self-pay | Source: Home / Self Care | Attending: Orthopedic Surgery

## 2022-10-03 ENCOUNTER — Ambulatory Visit (HOSPITAL_BASED_OUTPATIENT_CLINIC_OR_DEPARTMENT_OTHER)
Admission: RE | Admit: 2022-10-03 | Discharge: 2022-10-03 | Disposition: A | Discharge status: Home / Self Care | Payer: Managed Care, Other (non HMO) | Attending: Orthopedic Surgery | Admitting: Orthopedic Surgery

## 2022-10-03 DIAGNOSIS — I10 Essential (primary) hypertension: Secondary | ICD-10-CM

## 2022-10-03 DIAGNOSIS — Z01818 Encounter for other preprocedural examination: Secondary | ICD-10-CM

## 2022-10-03 DIAGNOSIS — E119 Type 2 diabetes mellitus without complications: Secondary | ICD-10-CM

## 2022-10-03 DIAGNOSIS — M25812 Other specified joint disorders, left shoulder: Secondary | ICD-10-CM | POA: Insufficient documentation

## 2022-10-03 DIAGNOSIS — M19012 Primary osteoarthritis, left shoulder: Secondary | ICD-10-CM | POA: Diagnosis not present

## 2022-10-03 DIAGNOSIS — M7502 Adhesive capsulitis of left shoulder: Secondary | ICD-10-CM | POA: Insufficient documentation

## 2022-10-03 DIAGNOSIS — Z7984 Long term (current) use of oral hypoglycemic drugs: Secondary | ICD-10-CM | POA: Diagnosis not present

## 2022-10-03 HISTORY — PX: SHOULDER ARTHROSCOPY WITH DISTAL CLAVICLE RESECTION: SHX5675

## 2022-10-03 LAB — GLUCOSE, CAPILLARY
Glucose-Capillary: 123 mg/dL — ABNORMAL HIGH (ref 70–99)
Glucose-Capillary: 118 mg/dL — ABNORMAL HIGH (ref 70–99)

## 2022-10-03 SURGERY — SHOULDER ARTHROSCOPY WITH DISTAL CLAVICLE RESECTION
Anesthesia: General | Site: Shoulder | Laterality: Left

## 2022-10-03 MED ORDER — PROPOFOL 10 MG/ML IV BOLUS
INTRAVENOUS | Status: AC
  Filled 2022-10-03: qty 20

## 2022-10-03 MED ORDER — PROMETHAZINE HCL 25 MG/ML IJ SOLN: 6.2500 mg | INTRAMUSCULAR | Status: DC | PRN

## 2022-10-03 MED ORDER — MEPERIDINE HCL 25 MG/ML IJ SOLN: 6.2500 mg | INTRAMUSCULAR | Status: DC | PRN

## 2022-10-03 MED ORDER — EPINEPHRINE 1 MG/ML IJ SOLN
INTRAMUSCULAR | Status: DC | PRN
  Administered 2022-10-03: 1 mg

## 2022-10-03 MED ORDER — PHENYLEPHRINE 80 MCG/ML (10ML) SYRINGE FOR IV PUSH (FOR BLOOD PRESSURE SUPPORT)
PREFILLED_SYRINGE | INTRAVENOUS | Status: DC | PRN
Start: 2022-10-03 — End: 2022-10-03
  Administered 2022-10-03 (×2): 80 ug via INTRAVENOUS

## 2022-10-03 MED ORDER — FENTANYL CITRATE (PF) 100 MCG/2ML IJ SOLN
INTRAMUSCULAR | Status: DC | PRN
  Administered 2022-10-03: 50 ug via INTRAVENOUS

## 2022-10-03 MED ORDER — LACTATED RINGERS IV SOLN: INTRAVENOUS | Status: DC

## 2022-10-03 MED ORDER — BUPIVACAINE LIPOSOME 1.3 % IJ SUSP
INTRAMUSCULAR | Status: DC | PRN
  Administered 2022-10-03: 10 mL via PERINEURAL

## 2022-10-03 MED ORDER — MIDAZOLAM HCL 2 MG/2ML IJ SOLN
INTRAMUSCULAR | Status: AC
  Filled 2022-10-03: qty 2

## 2022-10-03 MED ORDER — ONDANSETRON HCL 4 MG/2ML IJ SOLN
INTRAMUSCULAR | Status: DC | PRN
  Administered 2022-10-03: 4 mg via INTRAVENOUS

## 2022-10-03 MED ORDER — MIDAZOLAM HCL 2 MG/2ML IJ SOLN
2.0000 mg | Freq: Once | INTRAMUSCULAR | Status: AC
  Administered 2022-10-03: 2 mg via INTRAVENOUS

## 2022-10-03 MED ORDER — FENTANYL CITRATE (PF) 100 MCG/2ML IJ SOLN
100.0000 ug | Freq: Once | INTRAMUSCULAR | Status: AC
  Administered 2022-10-03: 100 ug via INTRAVENOUS

## 2022-10-03 MED ORDER — LIDOCAINE 2% (20 MG/ML) 5 ML SYRINGE
INTRAMUSCULAR | Status: DC | PRN
  Administered 2022-10-03: 40 mg via INTRAVENOUS

## 2022-10-03 MED ORDER — FENTANYL CITRATE (PF) 100 MCG/2ML IJ SOLN
INTRAMUSCULAR | Status: AC
  Filled 2022-10-03: qty 2

## 2022-10-03 MED ORDER — PROPOFOL 10 MG/ML IV BOLUS
INTRAVENOUS | Status: DC | PRN
Start: 2022-10-03 — End: 2022-10-03
  Administered 2022-10-03: 200 mg via INTRAVENOUS

## 2022-10-03 MED ORDER — BUPIVACAINE LIPOSOME 1.3 % IJ SUSP
INTRAMUSCULAR | Status: AC
  Filled 2022-10-03: qty 10

## 2022-10-03 MED ORDER — FENTANYL CITRATE (PF) 100 MCG/2ML IJ SOLN
25.0000 ug | INTRAMUSCULAR | Status: DC | PRN
  Administered 2022-10-03 (×3): 25 ug via INTRAVENOUS

## 2022-10-03 MED ORDER — HYDROCODONE-ACETAMINOPHEN 7.5-325 MG PO TABS: 1.0000 | ORAL_TABLET | Freq: Four times a day (QID) | ORAL | 0 refills | Status: DC | PRN

## 2022-10-03 MED ORDER — SODIUM CHLORIDE 0.9 % IR SOLN
Status: DC | PRN
  Administered 2022-10-03: 3000 mL

## 2022-10-03 MED ORDER — LIDOCAINE HCL (PF) 2 % IJ SOLN
INTRAMUSCULAR | Status: AC
  Filled 2022-10-03: qty 5

## 2022-10-03 MED ORDER — BUPIVACAINE HCL (PF) 0.5 % IJ SOLN
INTRAMUSCULAR | Status: DC | PRN
  Administered 2022-10-03: 20 mL via PERINEURAL

## 2022-10-03 MED ORDER — AMISULPRIDE (ANTIEMETIC) 5 MG/2ML IV SOLN: 10.0000 mg | Freq: Once | INTRAVENOUS | Status: DC | PRN

## 2022-10-03 MED ORDER — CEFAZOLIN SODIUM-DEXTROSE 2-4 GM/100ML-% IV SOLN
INTRAVENOUS | Status: AC
  Filled 2022-10-03: qty 100

## 2022-10-03 MED ORDER — CEFAZOLIN SODIUM-DEXTROSE 2-4 GM/100ML-% IV SOLN
2.0000 g | INTRAVENOUS | Status: AC
  Administered 2022-10-03: 2 g via INTRAVENOUS

## 2022-10-03 MED ORDER — EPHEDRINE SULFATE-NACL 50-0.9 MG/10ML-% IV SOSY
PREFILLED_SYRINGE | INTRAVENOUS | Status: DC | PRN
  Administered 2022-10-03: 10 mg via INTRAVENOUS

## 2022-10-03 MED ORDER — ONDANSETRON HCL 4 MG PO TABS: 4.0000 mg | ORAL_TABLET | Freq: Three times a day (TID) | ORAL | 0 refills | Status: AC | PRN

## 2022-10-03 SURGICAL SUPPLY — 46 items
BLADE SURG 11 STRL SS (BLADE) ×1 IMPLANT
BURR OVAL 8 FLU 4.0X13 (MISCELLANEOUS) IMPLANT
CANNULA 5.75X71 LONG (CANNULA) ×1 IMPLANT
CONNECTOR 5 IN 1 STRAIGHT STRL (MISCELLANEOUS) ×1 IMPLANT
DISSECTOR 3.8MM X 13CM (MISCELLANEOUS) ×1 IMPLANT
DRAPE INCISE IOBAN 66X45 STRL (DRAPES) IMPLANT
DRAPE ORTHO SPLIT 77X108 STRL (DRAPES) ×2
DRAPE POUCH INSTRU U-SHP 10X18 (DRAPES) ×1 IMPLANT
DRAPE SHEET LG 3/4 BI-LAMINATE (DRAPES) ×1 IMPLANT
DRAPE STERI 35X30 U-POUCH (DRAPES) ×1 IMPLANT
DRAPE SURG 17X23 STRL (DRAPES) ×1 IMPLANT
DRAPE SURG ORHT 6 SPLT 77X108 (DRAPES) ×2 IMPLANT
DRAPE U-SHAPE 47X51 STRL (DRAPES) ×1 IMPLANT
DURAPREP 26ML APPLICATOR (WOUND CARE) ×1 IMPLANT
GAUZE 4X4 16PLY ~~LOC~~+RFID DBL (SPONGE) ×1 IMPLANT
GAUZE PAD ABD 8X10 STRL (GAUZE/BANDAGES/DRESSINGS) IMPLANT
GAUZE SPONGE 4X4 12PLY STRL (GAUZE/BANDAGES/DRESSINGS) ×1 IMPLANT
GLOVE BIO SURGEON STRL SZ 6 (GLOVE) IMPLANT
GLOVE BIO SURGEON STRL SZ7.5 (GLOVE) ×2 IMPLANT
GLOVE BIOGEL PI IND STRL 6 (GLOVE) IMPLANT
GLOVE BIOGEL PI IND STRL 7.0 (GLOVE) IMPLANT
GLOVE BIOGEL PI IND STRL 8 (GLOVE) ×2 IMPLANT
GOWN STRL REUS W/TWL LRG LVL3 (GOWN DISPOSABLE) IMPLANT
GOWN STRL REUS W/TWL XL LVL3 (GOWN DISPOSABLE) ×2 IMPLANT
IV NS IRRIG 3000ML ARTHROMATIC (IV SOLUTION) ×2 IMPLANT
KIT TURNOVER CYSTO (KITS) ×1 IMPLANT
MANIFOLD NEPTUNE II (INSTRUMENTS) ×1 IMPLANT
NDL FILTER BLUNT 18X1 1/2 (NEEDLE) ×1 IMPLANT
NEEDLE FILTER BLUNT 18X1 1/2 (NEEDLE) ×1 IMPLANT
NS IRRIG 500ML POUR BTL (IV SOLUTION) ×1 IMPLANT
PACK ARTHROSCOPY DSU (CUSTOM PROCEDURE TRAY) ×1 IMPLANT
PACK BASIN DAY SURGERY FS (CUSTOM PROCEDURE TRAY) ×1 IMPLANT
PAD ARMBOARD 7.5X6 YLW CONV (MISCELLANEOUS) IMPLANT
PORT APPOLLO RF 90DEGREE MULTI (SURGICAL WAND) IMPLANT
PROBE APOLLO 90XL (SURGICAL WAND) IMPLANT
SLEEVE ARM SUSPENSION SYSTEM (MISCELLANEOUS) ×1 IMPLANT
SLEEVE SCD COMPRESS KNEE MED (STOCKING) ×1 IMPLANT
SLING ARM FOAM STRAP MED (SOFTGOODS) IMPLANT
SLING S3 LATERAL DISP (MISCELLANEOUS) ×1 IMPLANT
STRIP CLOSURE SKIN 1/2X4 (GAUZE/BANDAGES/DRESSINGS) IMPLANT
SUT MNCRL AB 3-0 PS2 27 (SUTURE) IMPLANT
SYR TB 1ML LL NO SAFETY (SYRINGE) IMPLANT
TAPE CLOTH SURG 6X10 WHT LF (GAUZE/BANDAGES/DRESSINGS) ×1 IMPLANT
TOWEL OR 17X24 6PK STRL BLUE (TOWEL DISPOSABLE) ×1 IMPLANT
TUBE CONNECTING 12X1/4 (SUCTIONS) ×2 IMPLANT
TUBING ARTHROSCOPY IRRIG 16FT (MISCELLANEOUS) ×1 IMPLANT

## 2022-10-03 NOTE — Anesthesia Procedure Notes (Signed)
Procedure Name: LMA Insertion Date/Time: 10/03/2022 9:07 AM  Performed by: Francie Massing, CRNAPre-anesthesia Checklist: Patient identified, Emergency Drugs available, Suction available and Patient being monitored Patient Re-evaluated:Patient Re-evaluated prior to induction Oxygen Delivery Method: Circle system utilized Preoxygenation: Pre-oxygenation with 100% oxygen Induction Type: IV induction Ventilation: Mask ventilation without difficulty LMA: LMA with gastric port inserted LMA Size: 4.0 Number of attempts: 1 Airway Equipment and Method: Bite block Placement Confirmation: positive ETCO2 Tube secured with: Tape Dental Injury: Teeth and Oropharynx as per pre-operative assessment

## 2022-10-03 NOTE — Anesthesia Procedure Notes (Signed)
Anesthesia Regional Block: Interscalene brachial plexus block   Pre-Anesthetic Checklist: , timeout performed,  Correct Patient, Correct Site, Correct Laterality,  Correct Procedure, Correct Position, site marked,  Risks and benefits discussed,  Surgical consent,  Pre-op evaluation,  At surgeon's request and post-op pain management  Laterality: Left  Prep: chloraprep       Needles:  Injection technique: Single-shot  Needle Type: Stimiplex     Needle Length: 9cm  Needle Gauge: 21     Additional Needles:   Procedures:,,,, ultrasound used (permanent image in chart),,    Narrative:  Start time: 10/03/2022 8:35 AM End time: 10/03/2022 8:40 AM Injection made incrementally with aspirations every 5 mL.  Performed by: Personally  Anesthesiologist: Lowella Curb, MD

## 2022-10-03 NOTE — Brief Op Note (Signed)
10/03/2022  9:59 AM  PATIENT:  [name]  57 y.o. female  PRE-OPERATIVE DIAGNOSIS:  Left shoulderadhesive capsulitis, acromioclavicular osteoarthrtis  POST-OPERATIVE DIAGNOSIS:  1. Left shoulderadhesive capsulitis,  2. Left shoulder acromioclavicular osteoarthrtis 3. Left shoulder subacromial impingement  PROCEDURE:  Procedure(s): SHOULDER ARTHROSCOPY WITH DISTAL CLAVICLE RESECTION CAPSULE RELEASE, subacromial decompression (Left)  SURGEON:  Surgeons and Role:    * Aundria Rud Noah Delaine, MD - Primary  ASSISTANTS: Angelica Ran, PA-C  ANESTHESIA:   regional and general  EBL:  10 cc  BLOOD ADMINISTERED:none  DRAINS: none   LOCAL MEDICATIONS USED:  NONE  SPECIMEN:  No Specimen  DISPOSITION OF SPECIMEN:  N/A  COUNTS:  YES  TOURNIQUET:  * No tourniquets in log *  DICTATION: .Note written in EPIC  PLAN OF CARE: Discharge to home after PACU  PATIENT DISPOSITION:  PACU - hemodynamically stable.   Delay start of Pharmacological VTE agent (>24hrs) due to surgical blood loss or risk of bleeding: not applicable

## 2022-10-03 NOTE — Discharge Instructions (Addendum)
Orthopedic surgery discharge instructions:  -Maintain postoperative bandage for 2 days.  He may remove the bandages on the second day.  You should expect mild to moderate drainage.  He may begin showering at that time but do not submerge underwater.  -No lifting over 2 pounds with operateive arm.  You may use the arm immediately for activities of daily living such as bathing, washing your face and brushing your teeth, eating, and getting dressed.  Otherwise maintain your sling when you are out of the house and sleeping.  -You should begin moving the arm is much as possible and as frequently as possible to avoid postoperative scarring of our capsule release.  This will help accelerate your motion.  -Apply ice liberally to the shoulder throughout the day.  For mild to moderate pain use Tylenol and Advil as needed around-the-clock.  For breakthrough pain use oxycodone as necessary.  -You will return to see Dr. Aundria Rud in the office in 2 weeks for routine postoperative check.    Regional Anesthesia Blocks  1. Numbness or the inability to move the "blocked" extremity may last from 3-48 hours after placement. The length of time depends on the medication injected and your individual response to the medication. If the numbness is not going away after 48 hours, call your surgeon.  2. The extremity that is blocked will need to be protected until the numbness is gone and the  Strength has returned. Because you cannot feel it, you will need to take extra care to avoid injury. Because it may be weak, you may have difficulty moving it or using it. You may not know what position it is in without looking at it while the block is in effect.  3. For blocks in the legs and feet, returning to weight bearing and walking needs to be done carefully. You will need to wait until the numbness is entirely gone and the strength has returned. You should be able to move your leg and foot normally before you try and bear weight  or walk. You will need someone to be with you when you first try to ensure you do not fall and possibly risk injury.  4. Bruising and tenderness at the needle site are common side effects and will resolve in a few days.  5. Persistent numbness or new problems with movement should be communicated to the surgeon or the [facility] [phone] [facility] [phone]).     Information for Discharge Teaching: EXPAREL (bupivacaine liposome injectable suspension)   Your surgeon or anesthesiologist gave you EXPAREL(bupivacaine) to help control your pain after surgery.  EXPAREL is a local anesthetic that provides pain relief by numbing the tissue around the surgical site. EXPAREL is designed to release pain medication over time and can control pain for up to 72 hours. Depending on how you respond to EXPAREL, you may require less pain medication during your recovery.  Possible side effects: Temporary loss of sensation or ability to move in the area where bupivacaine was injected. Nausea, vomiting, constipation Rarely, numbness and tingling in your mouth or lips, lightheadedness, or anxiety may occur. Call your doctor right away if you think you may be experiencing any of these sensations, or if you have other questions regarding possible side effects.  Follow all other discharge instructions given to you by your surgeon or nurse. Eat a healthy diet and drink plenty of water or other fluids.  If you return to the hospital for any reason within  96 hours following the administration of EXPAREL, it is important for health care providers to know that you have received this anesthetic. A teal colored band has been placed on your arm with the date, time and amount of EXPAREL you have received in order to alert and inform your health care providers. Please leave this armband in place for the full 96 hours following administration, and then you may remove the band.   Band to  be removed Tuesday October 07, 2022.     Post Anesthesia Home Care Instructions  Activity: Get plenty of rest for the remainder of the day. A responsible individual must stay with you for 24 hours following the procedure.  For the next 24 hours, DO NOT: -Drive a car -Advertising copywriter -Drink alcoholic beverages -Take any medication unless instructed by your physician -Make any legal decisions or sign important papers.  Meals: Start with liquid foods such as gelatin or soup. Progress to regular foods as tolerated. Avoid greasy, spicy, heavy foods. If nausea and/or vomiting occur, drink only clear liquids until the nausea and/or vomiting subsides. Call your physician if vomiting continues.  Special Instructions/Symptoms: Your throat may feel dry or sore from the anesthesia or the breathing tube placed in your throat during surgery. If this causes discomfort, gargle with warm salt water. The discomfort should disappear within 24 hours.

## 2022-10-03 NOTE — Progress Notes (Signed)
Assisted Dr. Nevada Crane with left, interscalene , ultrasound guided block. Side rails up, monitors on throughout procedure. See vital signs in flow sheet. Tolerated Procedure well.

## 2022-10-03 NOTE — H&P (Signed)
H&P update  The surgical history has been reviewed and remains accurate without interval change.  The patient was re-examined and patient's physiologic condition has not changed significantly in the last 30 days. The condition still exists that makes this procedure necessary. The treatment plan remains the same, without new options for care.  No new pharmacological allergies or types of therapy has been initiated that would change the plan or the appropriateness of the plan.  The patient and/or family understand the potential benefits and risks.   P. Aundria Rud, MD 10/03/2022 8:50 AM

## 2022-10-03 NOTE — Anesthesia Postprocedure Evaluation (Signed)
Anesthesia Post Note  Patient: [name]  Procedure(s) Performed: SHOULDER ARTHROSCOPY WITH DISTAL CLAVICLE RESECTION CAPSULE RELEASE, EXTENSIVE DEBRIDEMENT (Left: Shoulder)     Patient location during evaluation: PACU Anesthesia Type: General Level of consciousness: awake and alert Pain management: pain level controlled Vital Signs Assessment: post-procedure vital signs reviewed and stable Respiratory status: spontaneous breathing, nonlabored ventilation and respiratory function stable Cardiovascular status: blood pressure returned to baseline and stable Postop Assessment: no apparent nausea or vomiting Anesthetic complications: no   No notable events documented.  Last Vitals:  Vitals:   10/03/22 1045 10/03/22 1100  BP: (!) 146/87 133/81  Pulse: 93 88  Resp: 14 14  Temp: 37 C   SpO2: 100% 97%    Last Pain:  Vitals:   10/03/22 1100  TempSrc:   PainSc: 3                  Lowella Curb

## 2022-10-03 NOTE — H&P (Signed)
ORTHOPAEDIC H and P  REQUESTING PHYSICIAN: Yolonda Kida, MD  PCP:  Loyola Mast, MD  Chief Complaint: Left shoulder pain  HPI: [name] is a 57 y.o. female who complains of Recalcitrant left shoulder pain.  Here today for capsular release and distal clavicle resection on the left shoulder.  No new complaints.  She has failed conservative treatment.  Past Medical History:  Diagnosis Date   Abnormal uterine bleeding (AUB)    Anxiety    Arthritis    Diabetes mellitus    Elevated cholesterol    Endometrial polyp    Endometrial polyp    Endometriosis    Frozen shoulder    Hypertension    Migraines    Nodule    benign, thyroid   Ovarian cyst    Past Surgical History:  Procedure Laterality Date   COMBINED HYSTEROSCOPY DIAGNOSTIC / D&C  yrs ago   DIAGNOSTIC LAPAROSCOPY  1993   with laser adhesions   DILITATION & CURRETTAGE/HYSTROSCOPY WITH NOVASURE ABLATION N/A 11/04/2019   Procedure: DILATATION & CURETTAGE/HYSTEROSCOPY WITH NOVASURE ABLATION;  Surgeon: Theresia Majors, MD;  Location: [facility];  Service: Gynecology;  Laterality: N/A;   INTRAUTERINE DEVICE INSERTION     mirena-Inserted 05-16-14   KNEE SURGERY Left yrs ago   meniscurs tear repair   mirena removed  2021   OOPHORECTOMY  2008   left   ROTATOR CUFF REPAIR Right yrs ago   WRIST SURGERY Right    gang. cyst   Social History   Socioeconomic History   Marital status: Married    Spouse name: Not on file   Number of children: 1   Years of education: Not on file   Highest education level: Associate degree: academic program  Occupational History   Not on file  Tobacco Use   Smoking status: Never   Smokeless tobacco: Never  Vaping Use   Vaping status: Never Used  Substance and Sexual Activity   Alcohol use: Not Currently   Drug use: No   Sexual activity: Yes    Birth control/protection: Post-menopausal  Other Topics Concern   Not on file  Social History  Narrative   Degree in business administration and focus in HR   Social Determinants of Health   Financial Resource Strain: Low Risk  (08/19/2022)   Overall Financial Resource Strain (CARDIA)    Difficulty of Paying Living Expenses: Not hard at all  Food Insecurity: No Food Insecurity (08/19/2022)   Hunger Vital Sign    Worried About Running Out of Food in the Last Year: Never true    Ran Out of Food in the Last Year: Never true  Transportation Needs: No Transportation Needs (08/19/2022)   PRAPARE - Administrator, Civil Service (Medical): No    Lack of Transportation (Non-Medical): No  Physical Activity: Insufficiently Active (08/19/2022)   Exercise Vital Sign    Days of Exercise per Week: 1 day    Minutes of Exercise per Session: 10 min  Stress: No Stress Concern Present (08/19/2022)   [employer] of Occupational Health - Occupational Stress Questionnaire    Feeling of Stress : Not at all  Social Connections: Unknown (08/19/2022)   Social Connection and Isolation Panel [NHANES]    Frequency of Communication with Friends and Family: More than three times a week    Frequency of Social Gatherings with Friends and Family: Once a week    Attends Religious Services: Not on file  Active Member of Clubs or Organizations: Yes    Attends Engineer, structural: More than 4 times per year    Marital Status: Married   Family History  Problem Relation Age of Onset   Hypertension Mother    Diabetes Mother    COPD Mother    Cancer Mother        Lung   Hypertension Father    Stroke Father 74   Diabetes Sister    Hypertension Brother    Diabetes Maternal Aunt    Cancer Maternal Aunt        Kidney   Kidney disease Maternal Aunt    Diabetes Maternal Aunt    Stroke Maternal Grandfather    Diabetes Maternal Grandfather    Colon cancer Neg Hx    Esophageal cancer Neg Hx    Rectal cancer Neg Hx    Stomach cancer Neg Hx    Allergies  Allergen Reactions   Dilaudid  [Hydromorphone Hcl] Other (See Comments)    Broke in sweat and started shaking, can take oral   Sulfa Antibiotics Hives   Tylox [Oxycodone-Acetaminophen] Nausea And Vomiting    Can take plain tylenol   Clindamycin/Lincomycin Rash    hives   Prior to Admission medications   Medication Sig Start Date End Date Taking? Authorizing Provider  Ascorbic Acid (VITAMIN C) 1000 MG tablet Take 1,000 mg by mouth daily.   Yes [provider]  cholecalciferol (VITAMIN D3) 25 MCG (1000 UNIT) tablet Take 1,000 Units by mouth daily.   Yes [provider]  clonazePAM (KLONOPIN) 0.5 MG tablet Take 1 tablet (0.5 mg total) by mouth 2 (two) times daily as needed for anxiety. 01/16/22  Yes Mliss Sax, MD  metFORMIN (GLUCOPHAGE) 500 MG tablet Take 1 tablet (500 mg total) by mouth daily with breakfast. 11/13/21  Yes Loyola Mast, MD  olmesartan-hydrochlorothiazide (BENICAR HCT) 40-12.5 MG tablet Take 1 tablet by mouth daily. 05/19/22  Yes Loyola Mast, MD  PARoxetine (PAXIL) 20 MG tablet TAKE 1 TABLET BY MOUTH EVERY DAY Patient taking differently: Take 20 mg by mouth as needed. 05/02/22  Yes Loyola Mast, MD  rosuvastatin (CRESTOR) 10 MG tablet Take 1 tablet (10 mg total) by mouth daily. 11/12/21  Yes Loyola Mast, MD  sitaGLIPtin (JANUVIA) 100 MG tablet TAKE 1 TABLET BY MOUTH DAILY AFTER BREAKFAST. 03/10/22  Yes Loyola Mast, MD  zinc gluconate 50 MG tablet Take 50 mg by mouth daily.   Yes [provider]   No results found.  Positive ROS: All other systems have been reviewed and were otherwise negative with the exception of those mentioned in the HPI and as above.  Physical Exam: General: Alert, no acute distress Cardiovascular: No pedal edema Respiratory: No cyanosis, no use of accessory musculature GI: No organomegaly, abdomen is soft and non-tender Skin: No lesions in the area of chief complaint Neurologic: Sensation intact distally Psychiatric: Patient is  competent for consent with normal mood and affect Lymphatic: No axillary or cervical lymphadenopathy  MUSCULOSKELETAL:  Left shoulder and upper extremity is warm and well-perfused with no open wounds or lesions.  Neurovascular intact.  Assessment: 1.  Left shoulder adhesive capsulitis  2.  Left shoulder acromioclavicular joint arthritis  Plan: - Plan will be to proceed today with arthroscopic assisted capsular release with distal clavicle resection.  We will discharge home today from PACU.  We again discussed the risk of bleeding, infection, damage to surrounding nerves and vessels, persistent  stiffness, pain, fracture, need for revision surgery as well as persistent need for outpatient therapy.  We also discussed the risk of anesthesia.  She has provided informed consent.  Yolonda Kida, MD Cell [phone]    10/03/2022 8:20 AM

## 2022-10-03 NOTE — Transfer of Care (Signed)
Immediate Anesthesia Transfer of Care Note  Patient: [name]  Procedure(s) Performed: Procedure(s) (LRB): SHOULDER ARTHROSCOPY WITH DISTAL CLAVICLE RESECTION CAPSULE RELEASE, EXTENSIVE DEBRIDEMENT (Left)  Patient Location: PACU  Anesthesia Type: General  Level of Consciousness: awake, oriented, sedated and patient cooperative  Airway & Oxygen Therapy: Patient Spontanous Breathing and Patient connected to face mask oxygen  Post-op Assessment: Report given to PACU RN and Post -op Vital signs reviewed and stable  Post vital signs: Reviewed and stable  Complications: No apparent anesthesia complications  Last Vitals:  Vitals Value Taken Time  BP 142/89 10/03/22 1010  Temp 36.6 C 10/03/22 1010  Pulse 96 10/03/22 1013  Resp 20 10/03/22 1013  SpO2 100 % 10/03/22 1013  Vitals shown include unfiled device data.  Last Pain:  Vitals:   10/03/22 1010  TempSrc:   PainSc: 0-No pain      Patients Stated Pain Goal: 5 (10/03/22 0705)  Complications: No notable events documented.

## 2022-10-03 NOTE — Op Note (Signed)
Date of Surgery: 10/03/2022  INDICATIONS: [name] is a 57 y.o.-year-old female with a left progressive adhesive capsulitis as well as symptomatic AC joint arthropathy.  Here today for arthroscopic intervention given failure of significant improvement with exhaustive conservative treatments.;  The patient did consent to the procedure after discussion of the risks and benefits.  PREOPERATIVE DIAGNOSIS:  1.  Left shoulder adhesive capsulitis 2.  Left shoulder acromioclavicular arthropathy  POSTOPERATIVE DIAGNOSIS:  1.  Left shoulder adhesive capsulitis 2.  Left shoulder acromioclavicular arthropathy. 3.  Left shoulder subacromial impingement  PROCEDURE:  1.  Left shoulder arthroscopic capsular release, with biceps tenotomy 2.  Left shoulder arthroscopic subacromial decompression 3.  Left shoulder arthroscopic distal clavicle resection  SURGEON: Maryan Rued, M.D.  ASSIST: Isabel Saucier, Isabel-C  Assistant attestation:  [name], present and scrubbed for the entire procedure.  ANESTHESIA:  general, with regional  IV FLUIDS AND URINE: See anesthesia.  ESTIMATED BLOOD LOSS: 10 mL.  IMPLANTS: None  DRAINS: None  COMPLICATIONS: None.  DESCRIPTION OF PROCEDURE: The patient was brought to the operating room and placed supine on the operating table.  The patient had been signed prior to the procedure and this was documented. The patient had the anesthesia placed by the anesthesiologist.  A time-out was performed to confirm that this was the correct patient, site, side and location. The patient did receive antibiotics prior to the incision and was re-dosed during the procedure as needed at indicated intervals.  A tourniquet was not placed.  The patient had the operative extremity prepped and draped in the standard surgical fashion.      After obtaining informed consent the patient was brought to the operating table and underwent satisfactory anesthesia. An exam under anesthesia  revealed significant limitations in range of motion in all planes.  Elevation to about 90 with firm endpoint, external rotation 0 and abduction about 50 degrees with firm endpoint.  She was placed in the right lateral decubitus position with an axillary roll and all bony prominences properly padded. A standard surgical timeout was performed. She was placed in 10 pounds of gentle in-line suspension.  Standard posterior and anterior superior portals were established. A diagnostic evaluation of the glenohumeral joint was performed. The biceps tendon was markedly synovitic and partially torn. It was tenotomized.,  Then we made our way around with our capsular release from the 12 o'clock position down to the 4 4:30 position in this left shoulder.  We used combination of radiofrequency ablation device as well as the baskets to protect any potential iatrogenic damage to the axillary nerve.  We had significant release of a very thickened and synovitic capsule.  We then continued all of the rest of her intra-articular diagnostic arthroscopy.  We noted intact subscapularis after we were able to clear the rotator interval of inflammatory tissue.  She had intact supraspinatus, infraspinatus and teres minor.  No loose bodies.  There was extensive superior and posterior labral tearing that was likewise debrided with motorized shaver.  The arthroscope was inserted in the subacromial space and an additional lateral portal was established. An acromioplasty performed nicely decompressing the subacromial space with a motorized burr. Bursitis in the subacromial space was removed as well as releases were performed on the bursal surface of the rotator cuff.  No bursal sided rotator cuff tears were noted.  We then turned our attention to the distal clavicle resection.  We moved the arthroscope into the lateral portal and worked through the mid glenoid portal.  The radiofrequency wand was used to perform a subperiosteal dissection  of the [facility] joint.  This did reveal a large undersurface spur as well as significant sclerosis of the distal clavicle.  We then introduced a motorized bur through the mid glenoid portal.  And a 1 cm resection was performed of the distal clavicle as well as a 1 to 2 mm resection of the medial edge of the acromion.  This opened up the [facility] joint nicely.  This also decompressed the mass effect on the supraspinatus muscle.   The arthroscope was then removed and portals closed with 3-0 Monocryl in standard fashion followed by a sterile occlusive dressing Polar Care ice sleeve and a slingshot sling. The patient was sent to recovery in stable condition and tolerated the procedure well  POSTOPERATIVE PLAN:  [name] will be in her sling for comfort only.  She can discontinue the sling once the block has resolved.  She can begin active range of motion as soon as possible.  Lifting limitations to 2 to 5 pounds for activities of daily living for the first couple weeks so that she can focus on range of motion.  Otherwise I will see her back in the office in 2 weeks.  Discharge home today.

## 2022-10-03 NOTE — Anesthesia Preprocedure Evaluation (Signed)
Anesthesia Evaluation  Patient identified by MRN, date of birth, ID band Patient awake    Reviewed: Allergy & Precautions, NPO status , Patient's Chart, lab work & pertinent test results  Airway Mallampati: II  TM Distance: >3 FB Neck ROM: Full    Dental no notable dental hx. (+) Teeth Intact, Dental Advisory Given   Pulmonary neg pulmonary ROS   Pulmonary exam normal breath sounds clear to auscultation       Cardiovascular hypertension, Pt. on medications Normal cardiovascular exam Rhythm:Regular Rate:Normal     Neuro/Psych  Headaches  Anxiety Depression     negative psych ROS   GI/Hepatic negative GI ROS, Neg liver ROS,,,  Endo/Other  diabetes, Well Controlled, Type 2, Oral Hypoglycemic Agents    Renal/GU negative Renal ROS     Musculoskeletal  (+) Arthritis , Osteoarthritis,    Abdominal  (+) + obese  Peds  Hematology negative hematology ROS (+)   Anesthesia Other Findings   Reproductive/Obstetrics                             Anesthesia Physical Anesthesia Plan  ASA: III  Anesthesia Plan: General   Post-op Pain Management: Regional block* and Minimal or no pain anticipated   Induction: Intravenous  PONV Risk Score and Plan: 3 and Treatment may vary due to age or medical condition, Midazolam, Dexamethasone and Ondansetron  Airway Management Planned: LMA  Additional Equipment: None  Intra-op Plan:   Post-operative Plan:   Informed Consent: I have reviewed the patients History and Physical, chart, labs and discussed the procedure including the risks, benefits and alternatives for the proposed anesthesia with the patient or authorized representative who has indicated his/her understanding and acceptance.     Dental advisory given  Plan Discussed with: CRNA and Anesthesiologist  Anesthesia Plan Comments:         Anesthesia Quick Evaluation

## 2022-10-06 ENCOUNTER — Encounter (HOSPITAL_BASED_OUTPATIENT_CLINIC_OR_DEPARTMENT_OTHER): Payer: Self-pay | Admitting: Orthopedic Surgery

## 2022-10-06 ENCOUNTER — Telehealth: Payer: Self-pay

## 2022-10-06 NOTE — Transitions of Care (Post Inpatient/ED Visit) (Unsigned)
   10/06/2022  Name: [name] MRN: [id number] DOB: 1965/07/01  Today's TOC FU Call Status: Today's TOC FU Call Status:: Unsuccessful Call (1st Attempt) Unsuccessful Call (1st Attempt) Date: 10/06/22  Attempted to reach the patient regarding the most recent Inpatient/ED visit.  Follow Up Plan: Additional outreach attempts will be made to reach the patient to complete the Transitions of Care (Post Inpatient/ED visit) call.   Signature Arvil Persons, BSN, Charity fundraiser

## 2022-10-06 NOTE — Telephone Encounter (Signed)
Caller Name: Kathry Call back phone #: [phone]  Reason for Call: Pt returned your call. She said she is feels better. They gave her a nerve block and that is still working. They also prescribed hydrocodone she has not taken yet. She has PT on 5/6  I asked about a HFU she stated she will have one with Dr Aundria Rud at [facility].

## 2022-10-07 NOTE — Transitions of Care (Post Inpatient/ED Visit) (Signed)
   10/07/2022  Name: Maricelda Mccausland MRN: [id number] DOB: 1965-05-25  Today's TOC FU Call Status: Today's TOC FU Call Status:: Successful TOC FU Call Completed Unsuccessful Call (1st Attempt) Date: 10/06/22 [facility] FU Call Complete Date: 10/06/22  Transition Care Management Follow-up Telephone Call Date of Discharge: 10/03/22 Discharge Facility: [facility]) Type of Discharge: Emergency Department How have you been since you were released from the hospital?: Better Any questions or concerns?: No  Items Reviewed: Did you receive and understand the discharge instructions provided?: Yes Any new allergies since your discharge?: No Dietary orders reviewed?: NA Do you have support at home?: Yes  Medications Reviewed Today: Medications Reviewed Today   Medications were not reviewed in this encounter     Home Care and Equipment/Supplies: Were Home Health Services Ordered?: NA Any new equipment or medical supplies ordered?: NA  Functional Questionnaire: Do you need assistance with bathing/showering or dressing?: No Do you need assistance with meal preparation?: No Do you need assistance with eating?: No Do you have difficulty maintaining continence: No Do you need assistance with getting out of bed/getting out of a chair/moving?: No Do you have difficulty managing or taking your medications?: No  Follow up appointments reviewed: PCP Follow-up appointment confirmed?: NA Specialist Hospital Follow-up appointment confirmed?: NA Do you need transportation to your follow-up appointment?: No Do you understand care options if your condition(s) worsen?: Yes-patient verbalized understanding    SIGNATURE Arvil Persons, BSN, RN

## 2022-10-16 ENCOUNTER — Encounter (INDEPENDENT_AMBULATORY_CARE_PROVIDER_SITE_OTHER): Payer: Self-pay

## 2022-11-02 ENCOUNTER — Other Ambulatory Visit: Payer: Self-pay | Admitting: Family Medicine

## 2022-11-02 DIAGNOSIS — E119 Type 2 diabetes mellitus without complications: Secondary | ICD-10-CM

## 2022-11-11 ENCOUNTER — Other Ambulatory Visit: Payer: Self-pay

## 2022-11-17 ENCOUNTER — Encounter: Payer: Self-pay | Admitting: Family Medicine

## 2022-11-17 ENCOUNTER — Ambulatory Visit
Admission: RE | Admit: 2022-11-17 | Discharge: 2022-11-17 | Disposition: A | Discharge status: Home / Self Care | Payer: Managed Care, Other (non HMO) | Source: Ambulatory Visit | Attending: Family Medicine | Admitting: Family Medicine

## 2022-11-17 ENCOUNTER — Ambulatory Visit (INDEPENDENT_AMBULATORY_CARE_PROVIDER_SITE_OTHER): Payer: Managed Care, Other (non HMO) | Admitting: Family Medicine

## 2022-11-17 ENCOUNTER — Other Ambulatory Visit: Payer: Self-pay | Admitting: Family Medicine

## 2022-11-17 VITALS — BP 128/80 | HR 82 | Temp 97.9°F | Wt 186.6 lb

## 2022-11-17 DIAGNOSIS — Z0000 Encounter for general adult medical examination without abnormal findings: Secondary | ICD-10-CM

## 2022-11-17 DIAGNOSIS — I10 Essential (primary) hypertension: Secondary | ICD-10-CM

## 2022-11-17 DIAGNOSIS — Z1231 Encounter for screening mammogram for malignant neoplasm of breast: Secondary | ICD-10-CM

## 2022-11-17 DIAGNOSIS — E119 Type 2 diabetes mellitus without complications: Secondary | ICD-10-CM

## 2022-11-17 DIAGNOSIS — E785 Hyperlipidemia, unspecified: Secondary | ICD-10-CM | POA: Diagnosis not present

## 2022-11-17 DIAGNOSIS — Z23 Encounter for immunization: Secondary | ICD-10-CM | POA: Diagnosis not present

## 2022-11-17 DIAGNOSIS — Z7984 Long term (current) use of oral hypoglycemic drugs: Secondary | ICD-10-CM

## 2022-11-17 LAB — GLUCOSE, RANDOM: Glucose, Bld: 102 mg/dL — ABNORMAL HIGH (ref 70–99)

## 2022-11-17 LAB — HEMOGLOBIN A1C: Hgb A1c MFr Bld: 7 % — ABNORMAL HIGH (ref 4.6–6.5)

## 2022-11-17 NOTE — Assessment & Plan Note (Signed)
Blood pressure is in good control. Continue Benicar HCT 40-12.5 mg daily.

## 2022-11-17 NOTE — Assessment & Plan Note (Signed)
We will check A1c today. Continue sitagliptin 100 mg daily and metformin 500 mg daily.

## 2022-11-17 NOTE — Assessment & Plan Note (Signed)
Lipids at goal.  Continue rosuvastatin 10mg  daily.

## 2022-11-17 NOTE — Progress Notes (Signed)
[facility] PRIMARY CARE [facility] PRIMARY [facility] [address] [place] [address] Dept: [phone] Dept Fax: [phone]  Annual Physical Visit  Subjective:    Patient ID: [name], female    DOB: Aug 02, 1965, 57 y.o..   MRN: [id number]  Chief Complaint  Patient presents with   Hypertension    3 month flu. Fasting today.   History of Present Illness:  Patient is in today for an annual physical/preventative visit.  [name] has a history of hypertension, managed on Benicar HCT (olmesartan/HCTZ) 40-12.5 mg daily.   [name] has a history of Type 2 diabetes. She is managed on sitagliptin (Januvia) 100 mg daily and metformin 500 mg daily.   [name] has a history of hyperlipidemia and is managed on rosuvastatin 10 mg daily.   [name] has a history of a left shoulder partial rotator cuff tear and adhesive capsulitis. She underwent a left shoulder arthroscopy with distal clavicle resection, capsule release, and debridement. She feels her shoudler is doing much better at this point. She continues to engage in physical therapy.  Review of Systems  Constitutional:  Negative for chills, diaphoresis, fever, malaise/fatigue and weight loss.       Has concerns regarding central obesity. Admits some of this is dietary, as her husband frequently brings home fried foods for them to eat.  HENT:  Positive for congestion. Negative for ear pain, hearing loss, sinus pain, sore throat and tinnitus.        Coughing up some mucous occasionally, but overall prior post-COVID cough has resolved.  Eyes:  Negative for blurred vision, pain, discharge and redness.  Respiratory:  Negative for cough, shortness of breath and wheezing.   Cardiovascular:  Positive for palpitations. Negative for chest pain.       Continues to have occasional catch in her chest that may represent a skip beat. This seems to improve when she belches.  Gastrointestinal:  Negative for  abdominal pain, constipation, diarrhea, heartburn, nausea and vomiting.  Musculoskeletal:  Negative for back pain, joint pain and myalgias.       Left shoulder s/p surgery as noted above.  Skin:  Negative for itching and rash.  Psychiatric/Behavioral:  Negative for depression. The patient is not nervous/anxious.    Past Medical History: Patient Active Problem List   Diagnosis Date Noted   Insect bite of left upper arm 08/19/2022   COVID 04/10/2022   Depression with anxiety 09/27/2020   Migraine headache 09/27/2020   Insomnia 09/27/2020   Thyroid nodule 09/27/2020   Class 2 obesity due to excess calories with body mass index (BMI) of 37.0 to 37.9 in adult 03/09/2019   Acute right-sided low back pain with right-sided sciatica 06/10/2017   Trochanteric bursitis, right hip 05/27/2017   Atypical chest pain 11/18/2012   Type 2 diabetes mellitus (HCC) 11/18/2012   Essential hypertension 11/18/2012   Hyperlipidemia    Past Surgical History:  Procedure Laterality Date   COMBINED HYSTEROSCOPY DIAGNOSTIC / D&C  yrs ago   DIAGNOSTIC LAPAROSCOPY  1993   with laser adhesions   DILITATION & CURRETTAGE/HYSTROSCOPY WITH NOVASURE ABLATION N/A 11/04/2019   Procedure: DILATATION & CURETTAGE/HYSTEROSCOPY WITH NOVASURE ABLATION;  Surgeon: Theresia Majors, MD;  Location: [facility];  Service: Gynecology;  Laterality: N/A;   INTRAUTERINE DEVICE INSERTION     mirena-Inserted 05-16-14   KNEE SURGERY Left yrs ago   meniscurs tear repair   mirena removed  2021   OOPHORECTOMY  2008   left  ROTATOR CUFF REPAIR Right yrs ago   SHOULDER ARTHROSCOPY WITH DISTAL CLAVICLE RESECTION Left 10/03/2022   Procedure: SHOULDER ARTHROSCOPY WITH DISTAL CLAVICLE RESECTION CAPSULE RELEASE, EXTENSIVE DEBRIDEMENT;  Surgeon: Yolonda Kida, MD;  Location: [facility];  Service: Orthopedics;  Laterality: Left;   WRIST SURGERY Right    gang. cyst   Family History  Problem Relation Age of  Onset   Hypertension Mother    Diabetes Mother    COPD Mother    Cancer Mother        Lung   Hypertension Father    Stroke Father 30   Diabetes Sister    Hypertension Brother    Diabetes Maternal Aunt    Cancer Maternal Aunt        Kidney   Kidney disease Maternal Aunt    Diabetes Maternal Aunt    Stroke Maternal Grandfather    Diabetes Maternal Grandfather    Colon cancer Neg Hx    Esophageal cancer Neg Hx    Rectal cancer Neg Hx    Stomach cancer Neg Hx    Outpatient Medications Prior to Visit  Medication Sig Dispense Refill   Ascorbic Acid (VITAMIN C) 1000 MG tablet Take 1,000 mg by mouth daily.     cholecalciferol (VITAMIN D3) 25 MCG (1000 UNIT) tablet Take 1,000 Units by mouth daily.     clonazePAM (KLONOPIN) 0.5 MG tablet Take 1 tablet (0.5 mg total) by mouth 2 (two) times daily as needed for anxiety. 30 tablet 1   metFORMIN (GLUCOPHAGE) 500 MG tablet TAKE 1 TABLET BY MOUTH EVERY DAY WITH BREAKFAST 90 tablet 3   methocarbamol (ROBAXIN) 500 MG tablet Take 500 mg by mouth every 6 (six) hours as needed.     Multiple Vitamin (MULTIVITAMIN) tablet Take 1 tablet by mouth daily. Gummy     olmesartan-hydrochlorothiazide (BENICAR HCT) 40-12.5 MG tablet Take 1 tablet by mouth daily. 90 tablet 3   ondansetron (ZOFRAN) 4 MG tablet Take 1 tablet (4 mg total) by mouth every 8 (eight) hours as needed for nausea or vomiting. 20 tablet 0   PARoxetine (PAXIL) 20 MG tablet TAKE 1 TABLET BY MOUTH EVERY DAY (Patient taking differently: Take 20 mg by mouth as needed.) 90 tablet 3   rosuvastatin (CRESTOR) 10 MG tablet Take 1 tablet (10 mg total) by mouth daily. 90 tablet 3   sitaGLIPtin (JANUVIA) 100 MG tablet TAKE 1 TABLET BY MOUTH DAILY AFTER BREAKFAST. 90 tablet 3   traMADol (ULTRAM) 50 MG tablet Take 50 mg by mouth every 6 (six) hours as needed.     zinc gluconate 50 MG tablet Take 50 mg by mouth daily. (Patient not taking: Reported on 11/17/2022)     HYDROcodone-acetaminophen (NORCO) 7.5-325  MG tablet Take 1 tablet by mouth every 6 (six) hours as needed for moderate pain. (Patient not taking: Reported on 11/17/2022) 30 tablet 0   No facility-administered medications prior to visit.   Allergies  Allergen Reactions   Dilaudid [Hydromorphone Hcl] Other (See Comments)    Broke in sweat and started shaking, can take oral   Sulfa Antibiotics Hives   Tylox [Oxycodone-Acetaminophen] Nausea And Vomiting    Can take plain tylenol   Clindamycin/Lincomycin Rash    hives   Objective:   Today's Vitals   11/17/22 0957  BP: 128/80  Pulse: 82  Temp: 97.9 F (36.6 C)  TempSrc: Temporal  SpO2: 99%  Weight: 186 lb 9.6 oz (84.6 kg)   Body mass index is 39  kg/m.   General: Well developed, well nourished. No acute distress. HEENT: Normocephalic, non-traumatic. PERRL, EOMI. Conjunctiva clear. External ears normal. EAC and TMs normal   bilaterally. Nose clear without congestion or rhinorrhea. Mucous membranes moist. Oropharynx clear. Good dentition. Neck: Supple. No lymphadenopathy. No thyromegaly. Lungs: Clear to auscultation bilaterally. No wheezing, rales or rhonchi. CV: RRR without murmurs or rubs. Pulses 2+ bilaterally. Abdomen: Soft, non-tender. Bowel sounds positive, normal pitch and frequency. No hepatosplenomegaly. No rebound or   guarding. Extremities: Surgical sites at left shoulder are healed. ROM is fair. All other ROM normal. Skin: Warm and dry. No rashes. Psych: Alert and oriented. Normal mood and affect.  Health Maintenance Due  Topic Date Due   OPHTHALMOLOGY EXAM  06/02/2022   INFLUENZA VACCINE  10/02/2022     Assessment & Plan:   Problem List Items Addressed This Visit       Cardiovascular and Mediastinum   Essential hypertension    Blood pressure is in good control. Continue Benicar HCT 40-12.5 mg daily.        Endocrine   Type 2 diabetes mellitus (HCC)    We will check A1c today. Continue sitagliptin 100 mg daily and metformin 500 mg daily.       Relevant Orders   Glucose, random   Hemoglobin A1c     Other   Hyperlipidemia    Lipids at goal. Continue rosuvastatin 10 mg daily.      Other Visit Diagnoses     Annual physical exam    -  Primary   Overall health is good. Reviewed indicated screenigns and immunizations.   Need for immunization against influenza       Relevant Orders   Flu vaccine trivalent PF, 6mos and older(Flulaval,Afluria,Fluarix,Fluzone) (Completed)      Return in about 3 months (around 02/16/2023) for Reassessment.   Loyola Mast, MD

## 2022-12-04 LAB — HM DIABETES EYE EXAM

## 2022-12-05 ENCOUNTER — Encounter: Payer: Self-pay | Admitting: Family Medicine

## 2022-12-08 ENCOUNTER — Encounter: Payer: Self-pay | Admitting: Family Medicine

## 2022-12-08 ENCOUNTER — Ambulatory Visit (INDEPENDENT_AMBULATORY_CARE_PROVIDER_SITE_OTHER): Payer: Managed Care, Other (non HMO) | Admitting: Family Medicine

## 2022-12-08 VITALS — BP 146/84 | HR 77 | Temp 98.1°F | Ht <= 58 in | Wt 187.0 lb

## 2022-12-08 DIAGNOSIS — I10 Essential (primary) hypertension: Secondary | ICD-10-CM | POA: Diagnosis not present

## 2022-12-08 DIAGNOSIS — R519 Headache, unspecified: Secondary | ICD-10-CM | POA: Insufficient documentation

## 2022-12-08 MED ORDER — PREDNISONE 20 MG PO TABS
20.0000 mg | ORAL_TABLET | Freq: Two times a day (BID) | ORAL | 0 refills | Status: AC
Start: 2022-12-08 — End: 2022-12-15

## 2022-12-08 NOTE — Telephone Encounter (Signed)
Was seen today by Dr Doreene Burke. Dm/cma

## 2022-12-08 NOTE — Progress Notes (Signed)
Established Patient Office Visit   Subjective:  Patient ID: [name], female    DOB: 17-Dec-1965  Age: 57 y.o. MRN: [id number]  Chief Complaint  Patient presents with   Headache    Headache with blurred visit x 2 weeks. No other symptoms.     Headache  Associated symptoms include blurred vision and nausea. Pertinent negatives include no abdominal pain, eye redness, fever, tingling, vomiting or weakness.   Encounter Diagnoses  Name Primary?   Nonintractable headache, unspecified chronicity pattern, unspecified headache type Yes   Essential hypertension    Presents with a 2-week history of intermittent bitemporal headache that also includes her frontal area.  Headache has been nonprogressive.  There has been mild blurring of her vision.  She denies light sensitivity but says that there is relief when she closes her eyes.  There has been nausea.  There has been no prodromal symptoms.  Denies any sinus symptoms including cough fever chills.  Some increase in stress.  Has been taking ibuprofen daily.  She is worried that it could be something serious.  Blood pressure at home has been running in the 130s over 70-80.   Review of Systems  Constitutional: Negative.  Negative for chills and fever.  HENT: Negative.    Eyes:  Positive for blurred vision. Negative for discharge and redness.  Respiratory: Negative.    Cardiovascular: Negative.   Gastrointestinal:  Positive for nausea. Negative for abdominal pain and vomiting.  Genitourinary: Negative.   Musculoskeletal: Negative.  Negative for myalgias.  Skin:  Negative for rash.  Neurological:  Positive for headaches. Negative for tingling, loss of consciousness and weakness.  Endo/Heme/Allergies:  Negative for polydipsia.     Current Outpatient Medications:    clonazePAM (KLONOPIN) 0.5 MG tablet, Take 1 tablet (0.5 mg total) by mouth 2 (two) times daily as needed for anxiety., Disp: 30 tablet, Rfl: 1   metFORMIN (GLUCOPHAGE)  500 MG tablet, TAKE 1 TABLET BY MOUTH EVERY DAY WITH BREAKFAST, Disp: 90 tablet, Rfl: 3   methocarbamol (ROBAXIN) 500 MG tablet, Take 500 mg by mouth every 6 (six) hours as needed., Disp: , Rfl:    Multiple Vitamin (MULTIVITAMIN) tablet, Take 1 tablet by mouth daily. Gummy, Disp: , Rfl:    olmesartan-hydrochlorothiazide (BENICAR HCT) 40-12.5 MG tablet, Take 1 tablet by mouth daily., Disp: 90 tablet, Rfl: 3   ondansetron (ZOFRAN) 4 MG tablet, Take 1 tablet (4 mg total) by mouth every 8 (eight) hours as needed for nausea or vomiting., Disp: 20 tablet, Rfl: 0   PARoxetine (PAXIL) 20 MG tablet, TAKE 1 TABLET BY MOUTH EVERY DAY (Patient taking differently: Take 20 mg by mouth as needed.), Disp: 90 tablet, Rfl: 3   predniSONE (DELTASONE) 20 MG tablet, Take 1 tablet (20 mg total) by mouth 2 (two) times daily with a meal for 7 days., Disp: 14 tablet, Rfl: 0   rosuvastatin (CRESTOR) 10 MG tablet, Take 1 tablet (10 mg total) by mouth daily., Disp: 90 tablet, Rfl: 3   sitaGLIPtin (JANUVIA) 100 MG tablet, TAKE 1 TABLET BY MOUTH DAILY AFTER BREAKFAST., Disp: 90 tablet, Rfl: 3   Ascorbic Acid (VITAMIN C) 1000 MG tablet, Take 1,000 mg by mouth daily. (Patient not taking: Reported on 12/08/2022), Disp: , Rfl:    cholecalciferol (VITAMIN D3) 25 MCG (1000 UNIT) tablet, Take 1,000 Units by mouth daily. (Patient not taking: Reported on 12/08/2022), Disp: , Rfl:    traMADol (ULTRAM) 50 MG tablet, Take 50 mg by mouth every 6 (  six) hours as needed. (Patient not taking: Reported on 12/08/2022), Disp: , Rfl:    zinc gluconate 50 MG tablet, Take 50 mg by mouth daily. (Patient not taking: Reported on 11/17/2022), Disp: , Rfl:    Objective:     BP (!) 146/84   Pulse 77   Temp 98.1 F (36.7 C)   Ht 4\' 10"  (1.473 m)   Wt 187 lb (84.8 kg)   LMP  (LMP Unknown)   SpO2 96%   BMI 39.08 kg/m    Physical Exam Constitutional:      General: She is not in acute distress.    Appearance: Normal appearance. She is not  ill-appearing, toxic-appearing or diaphoretic.  HENT:     Head: Normocephalic and atraumatic.     Right Ear: Tympanic membrane, ear canal and external ear normal.     Left Ear: Tympanic membrane, ear canal and external ear normal.     Mouth/Throat:     Mouth: Mucous membranes are moist.     Pharynx: Oropharynx is clear. No oropharyngeal exudate or posterior oropharyngeal erythema.  Eyes:     General: No visual field deficit or scleral icterus.       Right eye: No discharge.        Left eye: No discharge.     Extraocular Movements: Extraocular movements intact.     Conjunctiva/sclera: Conjunctivae normal.     Pupils: Pupils are equal, round, and reactive to light.  Cardiovascular:     Rate and Rhythm: Normal rate and regular rhythm.  Pulmonary:     Effort: Pulmonary effort is normal. No respiratory distress.     Breath sounds: Normal breath sounds.  Musculoskeletal:     Cervical back: No rigidity or tenderness.  Skin:    General: Skin is warm and dry.  Neurological:     Mental Status: She is alert and oriented to person, place, and time.     Cranial Nerves: Cranial nerves 2-12 are intact. No cranial nerve deficit, dysarthria or facial asymmetry.  Psychiatric:        Mood and Affect: Mood normal.        Behavior: Behavior normal.      No results found for any visits on 12/08/22.    The 10-year ASCVD risk score (Arnett DK, et al., 2019) is: 10.5%    Assessment & Plan:   Nonintractable headache, unspecified chronicity pattern, unspecified headache type -     CBC with Differential/Platelet -     Sedimentation rate -     C-reactive protein -     Basic metabolic panel -     CT HEAD W & WO CONTRAST ( ); Future -     predniSONE; Take 1 tablet (20 mg total) by mouth 2 (two) times daily with a meal for 7 days.  Dispense: 14 tablet; Refill: 0  Essential hypertension    Return Schedule follow-up with Dr. Veto Kemps next week.Marland Kitchen  Headache with blurred vision.  Headache is  nonprogressive.  With daily ibuprofen use, there could be a rebound phenomena.  Prednisone 20 twice daily for 7 days.  Stressed the importance of going to the emergency room if symptoms become worse.  Information was given on rebound headache and general headache.  Mliss Sax, MD

## 2022-12-08 NOTE — Telephone Encounter (Signed)
Pt called in stating she needed an appt for a headache, I scheduled her one with Dr Doreene Burke. Then she started talking about her vision being blurred and an increased bp. I sent her over to nurse triage.

## 2022-12-09 ENCOUNTER — Encounter: Payer: Self-pay | Admitting: Family Medicine

## 2022-12-09 ENCOUNTER — Telehealth: Payer: Self-pay | Admitting: Family Medicine

## 2022-12-09 LAB — CBC WITH DIFFERENTIAL/PLATELET
Basophils Absolute: 0 10*3/uL (ref 0.0–0.1)
Basophils Relative: 0.3 % (ref 0.0–3.0)
Eosinophils Absolute: 0.1 10*3/uL (ref 0.0–0.7)
Eosinophils Relative: 0.9 % (ref 0.0–5.0)
HCT: 40.3 % (ref 36.0–46.0)
Hemoglobin: 13 g/dL (ref 12.0–15.0)
Lymphocytes Relative: 39.3 % (ref 12.0–46.0)
Lymphs Abs: 3.4 10*3/uL (ref 0.7–4.0)
MCHC: 32.2 g/dL (ref 30.0–36.0)
MCV: 82.2 fL (ref 78.0–100.0)
Monocytes Absolute: 0.6 10*3/uL (ref 0.1–1.0)
Monocytes Relative: 6.8 % (ref 3.0–12.0)
Neutro Abs: 4.5 10*3/uL (ref 1.4–7.7)
Neutrophils Relative %: 52.7 % (ref 43.0–77.0)
Platelets: 311 10*3/uL (ref 150.0–400.0)
RBC: 4.9 Mil/uL (ref 3.87–5.11)
RDW: 14.5 % (ref 11.5–15.5)
WBC: 8.6 10*3/uL (ref 4.0–10.5)

## 2022-12-09 LAB — SEDIMENTATION RATE: Sed Rate: 41 mm/h — ABNORMAL HIGH (ref 0–30)

## 2022-12-09 LAB — BASIC METABOLIC PANEL
BUN: 12 mg/dL (ref 6–23)
CO2: 31 meq/L (ref 19–32)
Calcium: 9.6 mg/dL (ref 8.4–10.5)
Chloride: 99 meq/L (ref 96–112)
Creatinine, Ser: 0.79 mg/dL (ref 0.40–1.20)
GFR: 84.28 mL/min (ref >60.00)
Glucose, Bld: 92 mg/dL (ref 70–99)
Potassium: 3.9 meq/L (ref 3.5–5.1)
Sodium: 140 meq/L (ref 135–145)

## 2022-12-09 LAB — C-REACTIVE PROTEIN: CRP: <1 mg/dL (ref 0.5–20.0)

## 2022-12-09 NOTE — Telephone Encounter (Signed)
Pt said the referral you sending her to is in [place] and the one she would like to [place] [facility] [place] [facility]. Can you switch it for her

## 2022-12-10 ENCOUNTER — Ambulatory Visit (HOSPITAL_BASED_OUTPATIENT_CLINIC_OR_DEPARTMENT_OTHER)
Admission: RE | Admit: 2022-12-10 | Discharge: 2022-12-10 | Disposition: A | Discharge status: Home / Self Care | Payer: Managed Care, Other (non HMO) | Source: Ambulatory Visit | Attending: Family Medicine | Admitting: Family Medicine

## 2022-12-10 ENCOUNTER — Encounter (HOSPITAL_BASED_OUTPATIENT_CLINIC_OR_DEPARTMENT_OTHER): Payer: Self-pay

## 2022-12-10 DIAGNOSIS — R519 Headache, unspecified: Secondary | ICD-10-CM | POA: Insufficient documentation

## 2022-12-10 MED ORDER — IOHEXOL 300 MG/ML  SOLN
100.0000 mL | Freq: Once | INTRAMUSCULAR | Status: AC | PRN
  Administered 2022-12-10: 75 mL via INTRAVENOUS

## 2022-12-10 NOTE — Telephone Encounter (Signed)
Patient went to [facility] location to get imaging done.  Dm/cma

## 2022-12-17 ENCOUNTER — Other Ambulatory Visit: Payer: Self-pay | Admitting: Family Medicine

## 2022-12-17 DIAGNOSIS — E785 Hyperlipidemia, unspecified: Secondary | ICD-10-CM

## 2022-12-19 ENCOUNTER — Ambulatory Visit (INDEPENDENT_AMBULATORY_CARE_PROVIDER_SITE_OTHER): Payer: Managed Care, Other (non HMO) | Admitting: Family Medicine

## 2022-12-19 ENCOUNTER — Encounter: Payer: Self-pay | Admitting: Family Medicine

## 2022-12-19 VITALS — BP 128/80 | HR 57 | Temp 97.6°F | Ht <= 58 in | Wt 187.0 lb

## 2022-12-19 DIAGNOSIS — R519 Headache, unspecified: Secondary | ICD-10-CM

## 2022-12-19 DIAGNOSIS — I10 Essential (primary) hypertension: Secondary | ICD-10-CM

## 2022-12-19 NOTE — Assessment & Plan Note (Signed)
Lab results are normal overall. Headaches have improved. CT report pending. Discussed management of headaches with Tylenol +/- ibuprofen.

## 2022-12-19 NOTE — Assessment & Plan Note (Signed)
Blood pressure is in good control. Continue Benicar HCT 40-12.5 mg daily.

## 2022-12-19 NOTE — Progress Notes (Signed)
[facility] CARE [facility] [address] [place] [address] Dept: [phone] Dept Fax: [phone]  Office Visit  Subjective:    Patient ID: [name], female    DOB: April 13, 1965, 57 y.o..   MRN: [id number]  Chief Complaint  Patient presents with   Follow-up    1 week f/u.  No concerns. 3 HA's this week and Left eye jumps.    History of Present Illness:  Patient is in today for reassessment of her headaches. She saw Dr. Doreene Burke last week with a recent increase in bitemporal headaches. He ordered lab work and a CT scan. He treated her with a 7-day course of prednisone. [name] feels the prednisone did seem to help. She has been taking some intermittent ibuprofen as needed.  Past Medical History: Patient Active Problem List   Diagnosis Date Noted   Nonintractable headache 12/08/2022   Insect bite of left upper arm 08/19/2022   COVID 04/10/2022   Depression with anxiety 09/27/2020   Migraine headache 09/27/2020   Insomnia 09/27/2020   Thyroid nodule 09/27/2020   Class 2 obesity due to excess calories with body mass index (BMI) of 37.0 to 37.9 in adult 03/09/2019   Acute right-sided low back pain with right-sided sciatica 06/10/2017   Trochanteric bursitis, right hip 05/27/2017   Atypical chest pain 11/18/2012   Type 2 diabetes mellitus (HCC) 11/18/2012   Essential hypertension 11/18/2012   Hyperlipidemia    Past Surgical History:  Procedure Laterality Date   COMBINED HYSTEROSCOPY DIAGNOSTIC / D&C  yrs ago   DIAGNOSTIC LAPAROSCOPY  1993   with laser adhesions   DILITATION & CURRETTAGE/HYSTROSCOPY WITH NOVASURE ABLATION N/A 11/04/2019   Procedure: DILATATION & CURETTAGE/HYSTEROSCOPY WITH NOVASURE ABLATION;  Surgeon: Theresia Majors, MD;  Location: [facility];  Service: Gynecology;  Laterality: N/A;   INTRAUTERINE DEVICE INSERTION     mirena-Inserted 05-16-14   KNEE SURGERY Left yrs ago   meniscurs  tear repair   mirena removed  2021   OOPHORECTOMY  2008   left   ROTATOR CUFF REPAIR Right yrs ago   SHOULDER ARTHROSCOPY WITH DISTAL CLAVICLE RESECTION Left 10/03/2022   Procedure: SHOULDER ARTHROSCOPY WITH DISTAL CLAVICLE RESECTION CAPSULE RELEASE, EXTENSIVE DEBRIDEMENT;  Surgeon: Yolonda Kida, MD;  Location: [facility];  Service: Orthopedics;  Laterality: Left;   WRIST SURGERY Right    gang. cyst   Family History  Problem Relation Age of Onset   Hypertension Mother    Diabetes Mother    COPD Mother    Cancer Mother        Lung   Hypertension Father    Stroke Father 71   Diabetes Sister    Hypertension Brother    Diabetes Maternal Aunt    Cancer Maternal Aunt        Kidney   Kidney disease Maternal Aunt    Diabetes Maternal Aunt    Stroke Maternal Grandfather    Diabetes Maternal Grandfather    Colon cancer Neg Hx    Esophageal cancer Neg Hx    Rectal cancer Neg Hx    Stomach cancer Neg Hx    Outpatient Medications Prior to Visit  Medication Sig Dispense Refill   Ascorbic Acid (VITAMIN C) 1000 MG tablet Take 1,000 mg by mouth daily.     cholecalciferol (VITAMIN D3) 25 MCG (1000 UNIT) tablet Take 1,000 Units by mouth daily.     clonazePAM (KLONOPIN) 0.5 MG tablet Take 1 tablet (0.5 mg  total) by mouth 2 (two) times daily as needed for anxiety. 30 tablet 1   metFORMIN (GLUCOPHAGE) 500 MG tablet TAKE 1 TABLET BY MOUTH EVERY DAY WITH BREAKFAST 90 tablet 3   methocarbamol (ROBAXIN) 500 MG tablet Take 500 mg by mouth every 6 (six) hours as needed.     Multiple Vitamin (MULTIVITAMIN) tablet Take 1 tablet by mouth daily. Gummy     olmesartan-hydrochlorothiazide (BENICAR HCT) 40-12.5 MG tablet Take 1 tablet by mouth daily. 90 tablet 3   ondansetron (ZOFRAN) 4 MG tablet Take 1 tablet (4 mg total) by mouth every 8 (eight) hours as needed for nausea or vomiting. 20 tablet 0   PARoxetine (PAXIL) 20 MG tablet TAKE 1 TABLET BY MOUTH EVERY DAY (Patient taking  differently: Take 20 mg by mouth as needed.) 90 tablet 3   rosuvastatin (CRESTOR) 10 MG tablet TAKE 1 TABLET BY MOUTH EVERY DAY 90 tablet 3   sitaGLIPtin (JANUVIA) 100 MG tablet TAKE 1 TABLET BY MOUTH DAILY AFTER BREAKFAST. 90 tablet 3   traMADol (ULTRAM) 50 MG tablet Take 50 mg by mouth every 6 (six) hours as needed.     zinc gluconate 50 MG tablet Take 50 mg by mouth daily.     No facility-administered medications prior to visit.   Allergies  Allergen Reactions   Dilaudid [Hydromorphone Hcl] Other (See Comments)    Broke in sweat and started shaking, can take oral   Sulfa Antibiotics Hives   Tylox [Oxycodone-Acetaminophen] Nausea And Vomiting    Can take plain tylenol   Clindamycin/Lincomycin Rash    hives     Objective:   Today's Vitals   12/19/22 1301  BP: 128/80  Pulse: (!) 57  Temp: 97.6 F (36.4 C)  TempSrc: Temporal  SpO2: 99%  Weight: 187 lb (84.8 kg)  Height: 4\' 10"  (1.473 m)   Body mass index is 39.08 kg/m.   General: Well developed, well nourished. No acute distress. HEENT: Normocephalic, non-traumatic. PERRL, EOMI. Conjunctiva clear. No pain on palpation over the temples   bilaterally. Psych: Alert and oriented. Normal mood and affect.  There are no preventive care reminders to display for this patient.  Lab Results    Latest Ref Rng & Units 12/08/2022    3:49 PM 08/08/2020    5:25 PM 04/26/2020    4:34 PM  CBC  WBC 4.0 - 10.5 K/uL 8.6  9.4  13.5   Hemoglobin 12.0 - 15.0 g/dL 13.2  44.0  10.2   Hematocrit 36.0 - 46.0 % 40.3  40.2  41.8   Platelets 150.0 - 400.0 K/uL 311.0  320  389       Latest Ref Rng & Units 12/08/2022    3:49 PM 11/17/2022   10:44 AM 09/12/2022    2:00 PM  CMP  Glucose 70 - 99 mg/dL 92  725  91   BUN 6 - 23 mg/dL 12   11   Creatinine 3.66 - 1.20 mg/dL 4.40   3.47   Sodium 425 - 145 mEq/L 140   139   Potassium 3.5 - 5.1 mEq/L 3.9   3.7   Chloride 96 - 112 mEq/L 99   100   CO2 19 - 32 mEq/L 31   30   Calcium 8.4 - 10.5 mg/dL  9.6   9.1    Erythrocyte Sedimentation Rate     Component Value Date/Time   ESRSEDRATE 41 (H) 12/08/2022 1549   Lab Results  Component Value Date   CRP <1.0  12/08/2022     Assessment & Plan:   Problem List Items Addressed This Visit       Cardiovascular and Mediastinum   Essential hypertension    Blood pressure is in good control. Continue Benicar HCT 40-12.5 mg daily.        Other   Nonintractable headache - Primary    Lab results are normal overall. Headaches have improved. CT report pending. Discussed management of headaches with Tylenol +/- ibuprofen.       Return if symptoms worsen or fail to improve.   Loyola Mast, MD

## 2022-12-22 ENCOUNTER — Other Ambulatory Visit: Payer: Self-pay | Admitting: Family Medicine

## 2022-12-22 DIAGNOSIS — S40862D Insect bite (nonvenomous) of left upper arm, subsequent encounter: Secondary | ICD-10-CM

## 2023-01-20 ENCOUNTER — Encounter: Payer: Self-pay | Admitting: Family Medicine

## 2023-02-16 ENCOUNTER — Ambulatory Visit (INDEPENDENT_AMBULATORY_CARE_PROVIDER_SITE_OTHER): Payer: Managed Care, Other (non HMO) | Admitting: Family Medicine

## 2023-02-16 ENCOUNTER — Encounter: Payer: Self-pay | Admitting: Family Medicine

## 2023-02-16 VITALS — BP 126/82 | HR 80 | Temp 97.9°F | Ht <= 58 in | Wt 187.4 lb

## 2023-02-16 DIAGNOSIS — Z7984 Long term (current) use of oral hypoglycemic drugs: Secondary | ICD-10-CM

## 2023-02-16 DIAGNOSIS — E119 Type 2 diabetes mellitus without complications: Secondary | ICD-10-CM

## 2023-02-16 DIAGNOSIS — I10 Essential (primary) hypertension: Secondary | ICD-10-CM | POA: Diagnosis not present

## 2023-02-16 DIAGNOSIS — E782 Mixed hyperlipidemia: Secondary | ICD-10-CM | POA: Diagnosis not present

## 2023-02-16 LAB — HEMOGLOBIN A1C: Hgb A1c MFr Bld: 7.6 % — ABNORMAL HIGH (ref 4.6–6.5)

## 2023-02-16 LAB — GLUCOSE, RANDOM: Glucose, Bld: 120 mg/dL — ABNORMAL HIGH (ref 70–99)

## 2023-02-16 MED ORDER — SAXAGLIPTIN HCL 5 MG PO TABS
5.0000 mg | ORAL_TABLET | Freq: Every day | ORAL | 3 refills | Status: DC
Start: 2023-02-16 — End: 2023-05-19

## 2023-02-16 NOTE — Assessment & Plan Note (Signed)
We will check A1c today. Continue sitagliptin 100 mg daily and metformin 500 mg daily. Once she runs out of her current sitagliptin, I will switch her to saxagliptin 5 mg daily.

## 2023-02-16 NOTE — Progress Notes (Signed)
[facility] [address] [place] [address] Dept: [phone] Dept Fax: [phone]  Chronic Care Office Visit  Subjective:    Patient ID: [name], female    DOB: 05-04-1965, 57 y.o..   MRN: [id number]  Chief Complaint  Patient presents with   Hypertension    3 month f/u HTN.   Average BS 126.    History of Present Illness:  Patient is in today for reassessment of chronic medical issues.  [name] has a history of hypertension. She is managed on olmesartan-HCTZ (Benicar HCT) 40-12.5 mg daily. She has a history of a sulfa allergy causing hives. she notes her pharmacist pointed out that this BP medication contains a sulfa  moiety. However,s he has never experienced hives on this medicine.   [name] has a history of Type 2 diabetes. She is managed on sitagliptin (Januvia) 100 mg daily and metformin 500 mg daily. Her insurance will be changing on Jan. 1 and will no longer cover sitagliptin. They have recommended using saxagliptin as an alternate. [name] notes she was on this in the past.   [name] has a history of hyperlipidemia. She is managed on rosuvastatin 10 mg daily.  Past Medical History: Patient Active Problem List   Diagnosis Date Noted   Nonintractable headache 12/08/2022   Insect bite of left upper arm 08/19/2022   COVID 04/10/2022   Depression with anxiety 09/27/2020   Migraine headache 09/27/2020   Insomnia 09/27/2020   Thyroid nodule 09/27/2020   Class 2 obesity due to excess calories with body mass index (BMI) of 37.0 to 37.9 in adult 03/09/2019   Acute right-sided low back pain with right-sided sciatica 06/10/2017   Trochanteric bursitis, right hip 05/27/2017   Atypical chest pain 11/18/2012   Type 2 diabetes mellitus (HCC) 11/18/2012   Essential hypertension 11/18/2012   Hyperlipidemia    Past Surgical History:  Procedure Laterality Date   COMBINED HYSTEROSCOPY  DIAGNOSTIC / D&C  yrs ago   DIAGNOSTIC LAPAROSCOPY  1993   with laser adhesions   DILITATION & CURRETTAGE/HYSTROSCOPY WITH NOVASURE ABLATION N/A 11/04/2019   Procedure: DILATATION & CURETTAGE/HYSTEROSCOPY WITH NOVASURE ABLATION;  Surgeon: Theresia Majors, MD;  Location: [facility];  Service: Gynecology;  Laterality: N/A;   INTRAUTERINE DEVICE INSERTION     mirena-Inserted 05-16-14   KNEE SURGERY Left yrs ago   meniscurs tear repair   mirena removed  2021   OOPHORECTOMY  2008   left   ROTATOR CUFF REPAIR Right yrs ago   SHOULDER ARTHROSCOPY WITH DISTAL CLAVICLE RESECTION Left 10/03/2022   Procedure: SHOULDER ARTHROSCOPY WITH DISTAL CLAVICLE RESECTION CAPSULE RELEASE, EXTENSIVE DEBRIDEMENT;  Surgeon: Yolonda Kida, MD;  Location: [facility];  Service: Orthopedics;  Laterality: Left;   WRIST SURGERY Right    gang. cyst   Family History  Problem Relation Age of Onset   Hypertension Mother    Diabetes Mother    COPD Mother    Cancer Mother        Lung   Hypertension Father    Stroke Father 96   Diabetes Sister    Hypertension Brother    Diabetes Maternal Aunt    Cancer Maternal Aunt        Kidney   Kidney disease Maternal Aunt    Diabetes Maternal Aunt    Stroke Maternal Grandfather    Diabetes Maternal Grandfather    Colon cancer Neg Hx    Esophageal cancer  Neg Hx    Rectal cancer Neg Hx    Stomach cancer Neg Hx    Outpatient Medications Prior to Visit  Medication Sig Dispense Refill   Ascorbic Acid (VITAMIN C) 1000 MG tablet Take 1,000 mg by mouth daily.     cholecalciferol (VITAMIN D3) 25 MCG (1000 UNIT) tablet Take 1,000 Units by mouth daily.     clonazePAM (KLONOPIN) 0.5 MG tablet Take 1 tablet (0.5 mg total) by mouth 2 (two) times daily as needed for anxiety. 30 tablet 1   metFORMIN (GLUCOPHAGE) 500 MG tablet TAKE 1 TABLET BY MOUTH EVERY DAY WITH BREAKFAST 90 tablet 3   Multiple Vitamin (MULTIVITAMIN) tablet Take 1 tablet by mouth  daily. Gummy     olmesartan-hydrochlorothiazide (BENICAR HCT) 40-12.5 MG tablet Take 1 tablet by mouth daily. 90 tablet 3   ondansetron (ZOFRAN) 4 MG tablet Take 1 tablet (4 mg total) by mouth every 8 (eight) hours as needed for nausea or vomiting. 20 tablet 0   PARoxetine (PAXIL) 20 MG tablet TAKE 1 TABLET BY MOUTH EVERY DAY (Patient taking differently: Take 20 mg by mouth as needed.) 90 tablet 3   rosuvastatin (CRESTOR) 10 MG tablet TAKE 1 TABLET BY MOUTH EVERY DAY 90 tablet 3   triamcinolone cream (KENALOG) 0.1 % APPLY TO AFFECTED AREA TWICE A DAY 30 g 0   zinc gluconate 50 MG tablet Take 50 mg by mouth daily.     sitaGLIPtin (JANUVIA) 100 MG tablet TAKE 1 TABLET BY MOUTH DAILY AFTER BREAKFAST. 90 tablet 3   traMADol (ULTRAM) 50 MG tablet Take 50 mg by mouth every 6 (six) hours as needed.     methocarbamol (ROBAXIN) 500 MG tablet Take 500 mg by mouth every 6 (six) hours as needed.     No facility-administered medications prior to visit.   Allergies  Allergen Reactions   Dilaudid [Hydromorphone Hcl] Other (See Comments)    Broke in sweat and started shaking, can take oral   Sulfa Antibiotics Hives   Tylox [Oxycodone-Acetaminophen] Nausea And Vomiting    Can take plain tylenol   Clindamycin/Lincomycin Rash    hives   Objective:   Today's Vitals   02/16/23 0823  BP: 126/82  Pulse: 80  Temp: 97.9 F (36.6 C)  TempSrc: Temporal  SpO2: 98%  Weight: 187 lb 6.4 oz (85 kg)  Height: 4\' 10"  (1.473 m)   Body mass index is 39.17 kg/m.   General: Well developed, well nourished. No acute distress. Psych: Alert and oriented. Normal mood and affect.  There are no preventive care reminders to display for this patient.    Assessment & Plan:   Problem List Items Addressed This Visit       Cardiovascular and Mediastinum   Essential hypertension   Blood pressure is in good control. Continue olmesartan-HCTZ (Benicar HCT) 40-12.5 mg daily. We discussed that as she is not having any  signs of hives on this medicine, we will continue this, despite the history of sulfa allergy.        Endocrine   Type 2 diabetes mellitus (HCC) - Primary   We will check A1c today. Continue sitagliptin 100 mg daily and metformin 500 mg daily. Once she runs out of her current sitagliptin, I will switch her to saxagliptin 5 mg daily.      Relevant Medications   saxagliptin HCl (ONGLYZA) 5 MG TABS tablet   Other Relevant Orders   Glucose, random   Hemoglobin A1c     Other  Hyperlipidemia   Lipids at goal. Continue rosuvastatin 10 mg daily.       Return in about 3 months (around 05/17/2023) for Reassessment.   Loyola Mast, MD

## 2023-02-16 NOTE — Assessment & Plan Note (Signed)
Blood pressure is in good control. Continue olmesartan-HCTZ (Benicar HCT) 40-12.5 mg daily. We discussed that as she is not having any signs of hives on this medicine, we will continue this, despite the history of sulfa allergy.

## 2023-02-16 NOTE — Assessment & Plan Note (Signed)
Lipids at goal. Continue rosuvastatin 10 mg daily. 

## 2023-03-05 ENCOUNTER — Encounter: Payer: Self-pay | Admitting: Family Medicine

## 2023-03-13 ENCOUNTER — Encounter: Payer: Self-pay | Admitting: Family Medicine

## 2023-03-25 ENCOUNTER — Telehealth: Payer: Self-pay | Admitting: Family Medicine

## 2023-03-25 NOTE — Telephone Encounter (Signed)
 ERROR

## 2023-03-27 ENCOUNTER — Telehealth: Payer: Self-pay | Admitting: Family Medicine

## 2023-03-27 NOTE — Telephone Encounter (Signed)
error

## 2023-04-24 ENCOUNTER — Ambulatory Visit (INDEPENDENT_AMBULATORY_CARE_PROVIDER_SITE_OTHER): Payer: Managed Care, Other (non HMO) | Admitting: Obstetrics and Gynecology

## 2023-04-24 ENCOUNTER — Encounter: Payer: Self-pay | Admitting: Obstetrics and Gynecology

## 2023-04-24 ENCOUNTER — Other Ambulatory Visit (HOSPITAL_COMMUNITY)
Admission: RE | Admit: 2023-04-24 | Discharge: 2023-04-24 | Disposition: A | Discharge status: Home / Self Care | Source: Ambulatory Visit | Attending: Obstetrics and Gynecology | Admitting: Obstetrics and Gynecology

## 2023-04-24 VITALS — BP 106/70 | HR 94 | Temp 98.2°F | Ht 58.27 in | Wt 187.0 lb

## 2023-04-24 DIAGNOSIS — Z6838 Body mass index (BMI) 38.0-38.9, adult: Secondary | ICD-10-CM | POA: Diagnosis not present

## 2023-04-24 DIAGNOSIS — N898 Other specified noninflammatory disorders of vagina: Secondary | ICD-10-CM

## 2023-04-24 DIAGNOSIS — Z124 Encounter for screening for malignant neoplasm of cervix: Secondary | ICD-10-CM

## 2023-04-24 DIAGNOSIS — Z1331 Encounter for screening for depression: Secondary | ICD-10-CM

## 2023-04-24 DIAGNOSIS — Z01419 Encounter for gynecological examination (general) (routine) without abnormal findings: Secondary | ICD-10-CM

## 2023-04-24 LAB — WET PREP FOR TRICH, YEAST, CLUE

## 2023-04-24 NOTE — Patient Instructions (Signed)

## 2023-04-24 NOTE — Assessment & Plan Note (Signed)
 Encouraged healthy diet and exercise.

## 2023-04-24 NOTE — Assessment & Plan Note (Signed)
 Cervical cancer screening performed according to ASCCP guidelines. Encouraged annual mammogram screening Colonoscopy UTD DXA N/A Labs and immunizations with her primary Encouraged safe sexual practices as indicated Encouraged healthy lifestyle practices with diet and exercise For patients under 50-56yo, I recommend 1200mg  calcium daily and 600IU of vitamin D daily.

## 2023-04-24 NOTE — Progress Notes (Signed)
 57 y.o. G3P1011 female here for annual exam. Married.  Works in accounts payable.  No LMP recorded (lmp unknown). Patient is postmenopausal.  She reports vaginal odor, itchy under folds of abdomen, having lots of hot flashes. Does not want to restart HRT.  Abnormal bleeding: None Pelvic discharge or pain: None Breast mass, nipple discharge or skin changes : None Last PAP:     Component Value Date/Time   DIAGPAP  08/29/2020 1644    - Negative for intraepithelial lesion or malignancy (NILM)   ADEQPAP  08/29/2020 1644    Satisfactory for evaluation; transformation zone component PRESENT.   Last mammogram: 11/17/22 BIRADS 1, density c Last colonoscopy: 12/25/2017 Sexually active: yes Exercising: no, used to exercise 3 days/week.  However decreased after elbow surgery August 2024.  Has not been able to get back into exercising. Smoker: no  GYN HISTORY: No significant history  OB History  Gravida Para Term Preterm AB Living  2 1 1  1 1   SAB IAB Ectopic Multiple Live Births          # Outcome Date GA Lbr Len/2nd Weight Sex Type Anes PTL Lv  2 AB           1 Term             Past Medical History:  Diagnosis Date   Abnormal uterine bleeding (AUB)    Anxiety    Arthritis    Diabetes mellitus    Elevated cholesterol    Endometrial polyp    Endometrial polyp    Endometriosis    Frozen shoulder    Hypertension    Migraines    Nodule    benign, thyroid   Ovarian cyst     Past Surgical History:  Procedure Laterality Date   COMBINED HYSTEROSCOPY DIAGNOSTIC / D&C  yrs ago   DIAGNOSTIC LAPAROSCOPY  1993   with laser adhesions   DILITATION & CURRETTAGE/HYSTROSCOPY WITH NOVASURE ABLATION N/A 11/04/2019   Procedure: DILATATION & CURETTAGE/HYSTEROSCOPY WITH NOVASURE ABLATION;  Surgeon: Theresia Majors, MD;  Location: [facility];  Service: Gynecology;  Laterality: N/A;   INTRAUTERINE DEVICE INSERTION     mirena-Inserted 05-16-14   KNEE SURGERY Left yrs  ago   meniscurs tear repair   mirena removed  2021   OOPHORECTOMY  2008   left   ROTATOR CUFF REPAIR Right yrs ago   SHOULDER ARTHROSCOPY WITH DISTAL CLAVICLE RESECTION Left 10/03/2022   Procedure: SHOULDER ARTHROSCOPY WITH DISTAL CLAVICLE RESECTION CAPSULE RELEASE, EXTENSIVE DEBRIDEMENT;  Surgeon: Yolonda Kida, MD;  Location: [facility];  Service: Orthopedics;  Laterality: Left;   WRIST SURGERY Right    gang. cyst    Current Outpatient Medications on File Prior to Visit  Medication Sig Dispense Refill   Ascorbic Acid (VITAMIN C) 1000 MG tablet Take 1,000 mg by mouth daily.     cholecalciferol (VITAMIN D3) 25 MCG (1000 UNIT) tablet Take 1,000 Units by mouth daily.     clonazePAM (KLONOPIN) 0.5 MG tablet Take 1 tablet (0.5 mg total) by mouth 2 (two) times daily as needed for anxiety. 30 tablet 1   metFORMIN (GLUCOPHAGE) 500 MG tablet TAKE 1 TABLET BY MOUTH EVERY DAY WITH BREAKFAST 90 tablet 3   Multiple Vitamin (MULTIVITAMIN) tablet Take 1 tablet by mouth daily. Gummy     olmesartan-hydrochlorothiazide (BENICAR HCT) 40-12.5 MG tablet Take 1 tablet by mouth daily. 90 tablet 3   ondansetron (ZOFRAN) 4 MG tablet Take 1 tablet (4  mg total) by mouth every 8 (eight) hours as needed for nausea or vomiting. 20 tablet 0   PARoxetine (PAXIL) 20 MG tablet TAKE 1 TABLET BY MOUTH EVERY DAY (Patient taking differently: Take 20 mg by mouth as needed.) 90 tablet 3   rosuvastatin (CRESTOR) 10 MG tablet TAKE 1 TABLET BY MOUTH EVERY DAY 90 tablet 3   saxagliptin HCl (ONGLYZA) 5 MG TABS tablet Take 1 tablet (5 mg total) by mouth daily. 90 tablet 3   zinc gluconate 50 MG tablet Take 50 mg by mouth daily.     No current facility-administered medications on file prior to visit.    Social History   Socioeconomic History   Marital status: Married    Spouse name: Not on file   Number of children: 1   Years of education: Not on file   Highest education level: Associate degree: academic  program  Occupational History   Not on file  Tobacco Use   Smoking status: Never   Smokeless tobacco: Never  Vaping Use   Vaping status: Never Used  Substance and Sexual Activity   Alcohol use: Not Currently   Drug use: No   Sexual activity: Yes    Birth control/protection: Post-menopausal  Other Topics Concern   Not on file  Social History Narrative   Degree in business administration and focus in HR   Social Drivers of Health   Financial Resource Strain: Low Risk  (08/19/2022)   Overall Financial Resource Strain (CARDIA)    Difficulty of Paying Living Expenses: Not hard at all  Food Insecurity: No Food Insecurity (08/19/2022)   Hunger Vital Sign    Worried About Running Out of Food in the Last Year: Never true    Ran Out of Food in the Last Year: Never true  Transportation Needs: No Transportation Needs (08/19/2022)   PRAPARE - Administrator, Civil Service (Medical): No    Lack of Transportation (Non-Medical): No  Physical Activity: Insufficiently Active (08/19/2022)   Exercise Vital Sign    Days of Exercise per Week: 1 day    Minutes of Exercise per Session: 10 min  Stress: No Stress Concern Present (08/19/2022)   [employer] of Occupational Health - Occupational Stress Questionnaire    Feeling of Stress : Not at all  Social Connections: Unknown (08/19/2022)   Social Connection and Isolation Panel [NHANES]    Frequency of Communication with Friends and Family: More than three times a week    Frequency of Social Gatherings with Friends and Family: Once a week    Attends Religious Services: Not on Insurance claims handler of Clubs or Organizations: Yes    Attends Banker Meetings: More than 4 times per year    Marital Status: Married  Catering manager Violence: Unknown (06/05/2021)   Received from [employer], [facility]   HITS    Physically Hurt: Not on file    Insult or Talk Down To: Not on file    Threaten Physical Harm: Not on file     Scream or Curse: Not on file    Family History  Problem Relation Age of Onset   Hypertension Mother    Diabetes Mother    COPD Mother    Cancer Mother        Lung   Hypertension Father    Stroke Father 52   Diabetes Sister    Hypertension Brother    Diabetes Maternal Aunt    Cancer Maternal  Aunt        Kidney   Kidney disease Maternal Aunt    Diabetes Maternal Aunt    Stroke Maternal Grandfather    Diabetes Maternal Grandfather    Colon cancer Neg Hx    Esophageal cancer Neg Hx    Rectal cancer Neg Hx    Stomach cancer Neg Hx     Allergies  Allergen Reactions   Dilaudid [Hydromorphone Hcl] Other (See Comments)    Broke in sweat and started shaking, can take oral   Sulfa Antibiotics Hives   Tylox [Oxycodone-Acetaminophen] Nausea And Vomiting    Can take plain tylenol   Clindamycin/Lincomycin Rash    hives      PE Today's Vitals   04/24/23 0752  BP: 106/70  Pulse: 94  Temp: 98.2 F (36.8 C)  TempSrc: Oral  SpO2: 97%  Weight: 187 lb (84.8 kg)  Height: 4' 10.27" (1.48 m)   Body mass index is 38.72 kg/m.  Physical Exam Vitals reviewed. Exam conducted with a chaperone present.  Constitutional:      General: She is not in acute distress.    Appearance: Normal appearance.  HENT:     Head: Normocephalic and atraumatic.     Nose: Nose normal.  Eyes:     Extraocular Movements: Extraocular movements intact.     Conjunctiva/sclera: Conjunctivae normal.  Neck:     Thyroid: No thyroid mass, thyromegaly or thyroid tenderness.  Pulmonary:     Effort: Pulmonary effort is normal.  Chest:     Chest wall: No mass or tenderness.  Breasts:    Right: Normal. No swelling, mass, nipple discharge, skin change or tenderness.     Left: Normal. No swelling, mass, nipple discharge, skin change or tenderness.  Abdominal:     General: There is no distension.     Palpations: Abdomen is soft.     Tenderness: There is no abdominal tenderness.  Genitourinary:    General:  Normal vulva.     Exam position: Lithotomy position.     Urethra: No prolapse.     Vagina: Normal. No vaginal discharge or bleeding.     Cervix: Normal. No lesion.     Uterus: Normal. Not enlarged and not tender.      Adnexa: Right adnexa normal and left adnexa normal.  Musculoskeletal:        General: Normal range of motion.     Cervical back: Normal range of motion.  Lymphadenopathy:     Upper Body:     Right upper body: No axillary adenopathy.     Left upper body: No axillary adenopathy.     Lower Body: No right inguinal adenopathy. No left inguinal adenopathy.  Skin:    General: Skin is warm and dry.  Neurological:     General: No focal deficit present.     Mental Status: She is alert.  Psychiatric:        Mood and Affect: Mood normal.        Behavior: Behavior normal.       Assessment and Plan:        Well woman exam with routine gynecological exam Assessment & Plan: Cervical cancer screening performed according to ASCCP guidelines. Encouraged annual mammogram screening Colonoscopy UTD DXA N/A Labs and immunizations with her primary Encouraged safe sexual practices as indicated Encouraged healthy lifestyle practices with diet and exercise For patients under 50-70yo, I recommend 1200mg  calcium daily and 600IU of vitamin D daily.    Cervical cancer screening -  Cytology - PAP  Vaginal odor -     WET PREP FOR TRICH, YEAST, CLUE  BMI 38.0-38.9,adult Assessment & Plan: Encouraged healthy diet and exercise.   Discussed use of goldbond powder for abdominal fold  Rosalyn Gess, MD

## 2023-04-28 ENCOUNTER — Encounter: Payer: Self-pay | Admitting: Obstetrics and Gynecology

## 2023-04-28 LAB — CYTOLOGY - PAP
Adequacy: ABSENT
Comment: NEGATIVE
Diagnosis: NEGATIVE
High risk HPV: NEGATIVE

## 2023-05-05 ENCOUNTER — Other Ambulatory Visit: Payer: Self-pay | Admitting: Family Medicine

## 2023-05-05 DIAGNOSIS — F418 Other specified anxiety disorders: Secondary | ICD-10-CM

## 2023-05-12 ENCOUNTER — Encounter: Payer: Self-pay | Admitting: Family Medicine

## 2023-05-18 ENCOUNTER — Ambulatory Visit: Payer: Managed Care, Other (non HMO) | Admitting: Family Medicine

## 2023-05-19 ENCOUNTER — Other Ambulatory Visit (HOSPITAL_COMMUNITY): Payer: Self-pay

## 2023-05-19 ENCOUNTER — Telehealth: Payer: Self-pay

## 2023-05-19 ENCOUNTER — Encounter: Payer: Self-pay | Admitting: Family Medicine

## 2023-05-19 ENCOUNTER — Ambulatory Visit (INDEPENDENT_AMBULATORY_CARE_PROVIDER_SITE_OTHER): Payer: Managed Care, Other (non HMO) | Admitting: Family Medicine

## 2023-05-19 VITALS — BP 124/80 | HR 95 | Temp 97.7°F | Ht 58.25 in | Wt 185.6 lb

## 2023-05-19 DIAGNOSIS — Z7984 Long term (current) use of oral hypoglycemic drugs: Secondary | ICD-10-CM

## 2023-05-19 DIAGNOSIS — I10 Essential (primary) hypertension: Secondary | ICD-10-CM | POA: Diagnosis not present

## 2023-05-19 DIAGNOSIS — Z7985 Long-term (current) use of injectable non-insulin antidiabetic drugs: Secondary | ICD-10-CM

## 2023-05-19 DIAGNOSIS — E119 Type 2 diabetes mellitus without complications: Secondary | ICD-10-CM | POA: Diagnosis not present

## 2023-05-19 DIAGNOSIS — E782 Mixed hyperlipidemia: Secondary | ICD-10-CM

## 2023-05-19 LAB — BASIC METABOLIC PANEL
BUN: 9 mg/dL (ref 6–23)
CO2: 28 meq/L (ref 19–32)
Calcium: 9.7 mg/dL (ref 8.4–10.5)
Chloride: 100 meq/L (ref 96–112)
Creatinine, Ser: 0.72 mg/dL (ref 0.40–1.20)
GFR: 93.92 mL/min (ref >60.00)
Glucose, Bld: 100 mg/dL — ABNORMAL HIGH (ref 70–99)
Potassium: 3.4 meq/L — ABNORMAL LOW (ref 3.5–5.1)
Sodium: 139 meq/L (ref 135–145)

## 2023-05-19 LAB — URINALYSIS, ROUTINE W REFLEX MICROSCOPIC
Bilirubin Urine: NEGATIVE
Ketones, ur: NEGATIVE
Nitrite: NEGATIVE
Specific Gravity, Urine: 1.02 (ref 1.000–1.030)
Total Protein, Urine: NEGATIVE
Urine Glucose: NEGATIVE
Urobilinogen, UA: 0.2 (ref 0.0–1.0)
pH: 6 (ref 5.0–8.0)

## 2023-05-19 LAB — LIPID PANEL
Cholesterol: 136 mg/dL (ref 0–200)
HDL: 61.7 mg/dL (ref >39.00)
LDL Cholesterol: 61 mg/dL (ref 0–99)
NonHDL: 74.16
Total CHOL/HDL Ratio: 2
Triglycerides: 66 mg/dL (ref 0.0–149.0)
VLDL: 13.2 mg/dL (ref 0.0–40.0)

## 2023-05-19 LAB — HEMOGLOBIN A1C: Hgb A1c MFr Bld: 7.3 % — ABNORMAL HIGH (ref 4.6–6.5)

## 2023-05-19 LAB — MICROALBUMIN / CREATININE URINE RATIO
Creatinine,U: 135.3 mg/dL
Microalb Creat Ratio: 9.2 mg/g (ref 0.0–30.0)
Microalb, Ur: 1.2 mg/dL (ref 0.0–1.9)

## 2023-05-19 MED ORDER — OLMESARTAN MEDOXOMIL-HCTZ 40-12.5 MG PO TABS: 1.0000 | ORAL_TABLET | Freq: Every day | ORAL | 3 refills | Status: AC

## 2023-05-19 MED ORDER — OZEMPIC (0.25 OR 0.5 MG/DOSE) 2 MG/3ML ~~LOC~~ SOPN: PEN_INJECTOR | SUBCUTANEOUS | 0 refills | Status: AC

## 2023-05-19 MED ORDER — SEMAGLUTIDE (1 MG/DOSE) 4 MG/3ML ~~LOC~~ SOPN: 1.0000 mg | PEN_INJECTOR | SUBCUTANEOUS | 0 refills | Status: DC

## 2023-05-19 NOTE — Telephone Encounter (Signed)
 PA for Ozempic submitted though cover my meds.  Awaiting response.  Dm/cma  Key: [id number]

## 2023-05-19 NOTE — Assessment & Plan Note (Signed)
 I will check lipids today. Continue rosuvastatin 10 mg daily.

## 2023-05-19 NOTE — Assessment & Plan Note (Signed)
 Blood pressure is in good control. Continue olmesartan-HCTZ (Benicar HCT) 40-12.5 mg daily. I will check annual renal labs today.

## 2023-05-19 NOTE — Assessment & Plan Note (Signed)
 We will check annual DM labs today. I do not advise stopping metformin. I am agreeable to switching her from saxagliptin to semaglutide (Ozempic). I reviewed potential side effects and management of these. We will start at 0.25 mg weekly and taper up to 1 mg over the next few months.

## 2023-05-19 NOTE — Progress Notes (Signed)
 [facility] [address] [place] [address] Dept: [phone] Dept Fax: [phone]  Chronic Care Office Visit  Subjective:    Patient ID: [name], female    DOB: 06-17-1965, 58 y.o..   MRN: [id number]  Chief Complaint  Patient presents with   Diabetes    3 month f/u.  BS 88-104   History of Present Illness:  Patient is in today for reassessment of chronic medical issues.  Ms. Canale has a history of hypertension. She is managed on olmesartan-HCTZ (Benicar HCT) 40-12.5 mg daily.    Ms. Howser has a history of Type 2 diabetes. She is managed on saxagliptin (Onglyza) 5 mg daily and metformin 500 mg daily.  We switched to saxagliptin after her last visit due to an insurance change. Ms. Lamere wonders about switching from her metformin to another medicine, possibly one that might help with weight loss. She notes she has been on metformin for a long time now. She has friends/family that have use Trulicity or Ozempic and had good weight loss.   Ms. Prisco has a history of hyperlipidemia. She is managed on rosuvastatin 10 mg daily.  Past Medical History: Patient Active Problem List   Diagnosis Date Noted   Nonintractable headache 12/08/2022   Depression with anxiety 09/27/2020   Migraine headache 09/27/2020   Insomnia 09/27/2020   Thyroid nodule- benign follicular 09/27/2020   BMI 38.0-38.9,adult 03/09/2019   Acute right-sided low back pain with right-sided sciatica 06/10/2017   Trochanteric bursitis, right hip 05/27/2017   Atypical chest pain 11/18/2012   Type 2 diabetes mellitus (HCC) 11/18/2012   Essential hypertension 11/18/2012   Hyperlipidemia    Past Surgical History:  Procedure Laterality Date   COMBINED HYSTEROSCOPY DIAGNOSTIC / D&C  yrs ago   DIAGNOSTIC LAPAROSCOPY  1993   with laser adhesions   DILITATION & CURRETTAGE/HYSTROSCOPY WITH NOVASURE ABLATION N/A 11/04/2019   Procedure:  DILATATION & CURETTAGE/HYSTEROSCOPY WITH NOVASURE ABLATION;  Surgeon: Theresia Majors, MD;  Location: [facility];  Service: Gynecology;  Laterality: N/A;   INTRAUTERINE DEVICE INSERTION     mirena-Inserted 05-16-14   KNEE SURGERY Left yrs ago   meniscurs tear repair   mirena removed  2021   OOPHORECTOMY  2008   left   ROTATOR CUFF REPAIR Right yrs ago   SHOULDER ARTHROSCOPY WITH DISTAL CLAVICLE RESECTION Left 10/03/2022   Procedure: SHOULDER ARTHROSCOPY WITH DISTAL CLAVICLE RESECTION CAPSULE RELEASE, EXTENSIVE DEBRIDEMENT;  Surgeon: Yolonda Kida, MD;  Location: [facility];  Service: Orthopedics;  Laterality: Left;   WRIST SURGERY Right    gang. cyst   Family History  Problem Relation Age of Onset   Hypertension Mother    Diabetes Mother    COPD Mother    Cancer Mother        Lung   Hypertension Father    Stroke Father 28   Diabetes Sister    Hypertension Brother    Diabetes Maternal Aunt    Cancer Maternal Aunt        Kidney   Kidney disease Maternal Aunt    Diabetes Maternal Aunt    Stroke Maternal Grandfather    Diabetes Maternal Grandfather    Colon cancer Neg Hx    Esophageal cancer Neg Hx    Rectal cancer Neg Hx    Stomach cancer Neg Hx    Outpatient Medications Prior to Visit  Medication Sig Dispense Refill   Ascorbic Acid (VITAMIN C) 1000 MG  tablet Take 1,000 mg by mouth daily.     cholecalciferol (VITAMIN D3) 25 MCG (1000 UNIT) tablet Take 1,000 Units by mouth daily.     clonazePAM (KLONOPIN) 0.5 MG tablet Take 1 tablet (0.5 mg total) by mouth 2 (two) times daily as needed for anxiety. 30 tablet 1   metFORMIN (GLUCOPHAGE) 500 MG tablet TAKE 1 TABLET BY MOUTH EVERY DAY WITH BREAKFAST 90 tablet 3   Multiple Vitamin (MULTIVITAMIN) tablet Take 1 tablet by mouth daily. Gummy     ondansetron (ZOFRAN) 4 MG tablet Take 1 tablet (4 mg total) by mouth every 8 (eight) hours as needed for nausea or vomiting. 20 tablet 0   PARoxetine  (PAXIL) 20 MG tablet TAKE 1 TABLET BY MOUTH EVERY DAY 90 tablet 3   rosuvastatin (CRESTOR) 10 MG tablet TAKE 1 TABLET BY MOUTH EVERY DAY 90 tablet 3   saxagliptin HCl (ONGLYZA) 5 MG TABS tablet Take 1 tablet (5 mg total) by mouth daily. 90 tablet 3   zinc gluconate 50 MG tablet Take 50 mg by mouth daily.     olmesartan-hydrochlorothiazide (BENICAR HCT) 40-12.5 MG tablet Take 1 tablet by mouth daily. 90 tablet 3   No facility-administered medications prior to visit.   Allergies  Allergen Reactions   Dilaudid [Hydromorphone Hcl] Other (See Comments)    Broke in sweat and started shaking, can take oral   Sulfa Antibiotics Hives   Tylox [Oxycodone-Acetaminophen] Nausea And Vomiting    Can take plain tylenol   Clindamycin/Lincomycin Rash    hives   Objective:   Today's Vitals   05/19/23 0846  BP: 124/80  Pulse: 95  Temp: 97.7 F (36.5 C)  TempSrc: Temporal  SpO2: 98%  Weight: 185 lb 9.6 oz (84.2 kg)  Height: 4' 10.25" (1.48 m)   Body mass index is 38.46 kg/m.   General: Well developed, well nourished. No acute distress. Feet- Skin intact. No sign of maceration between toes. Nails are normal. Dorsalis pedis and posterior tibial artery   pulses are normal. 5.07 monofilament testing normal. Psych: Alert and oriented. Normal mood and affect.  Health Maintenance Due  Topic Date Due   Diabetic kidney evaluation - Urine ACR  05/19/2023   FOOT EXAM  05/19/2023     Assessment & Plan:   Problem List Items Addressed This Visit       Cardiovascular and Mediastinum   Essential hypertension - Primary   Blood pressure is in good control. Continue olmesartan-HCTZ (Benicar HCT) 40-12.5 mg daily. I will check annual renal labs today.      Relevant Medications   olmesartan-hydrochlorothiazide (BENICAR HCT) 40-12.5 MG tablet     Endocrine   Type 2 diabetes mellitus (HCC)   We will check annual DM labs today. I do not advise stopping metformin. I am agreeable to switching her from  saxagliptin to semaglutide (Ozempic). I reviewed potential side effects and management of these. We will start at 0.25 mg weekly and taper up to 1 mg over the next few months.      Relevant Medications   Semaglutide,0.25 or 0.5MG /DOS, (OZEMPIC, 0.25 OR 0.5 MG/DOSE,) 2 MG/3ML SOPN   Semaglutide, 1 MG/DOSE, 4 MG/3ML SOPN   olmesartan-hydrochlorothiazide (BENICAR HCT) 40-12.5 MG tablet   Other Relevant Orders   Microalbumin / creatinine urine ratio   Basic metabolic panel   Hemoglobin A1c   Urinalysis, Routine w reflex microscopic     Other   Hyperlipidemia   I will check lipids today. Continue rosuvastatin 10 mg  daily.      Relevant Medications   olmesartan-hydrochlorothiazide (BENICAR HCT) 40-12.5 MG tablet   Other Relevant Orders   Lipid panel    Return in about 3 months (around 08/19/2023) for Reassessment.   Loyola Mast, MD

## 2023-06-02 ENCOUNTER — Encounter: Payer: Self-pay | Admitting: Family Medicine

## 2023-06-04 ENCOUNTER — Encounter: Payer: Self-pay | Admitting: Internal Medicine

## 2023-06-04 ENCOUNTER — Ambulatory Visit (INDEPENDENT_AMBULATORY_CARE_PROVIDER_SITE_OTHER): Payer: Managed Care, Other (non HMO) | Admitting: Internal Medicine

## 2023-06-04 VITALS — BP 120/70 | HR 100 | Ht 58.25 in | Wt 184.0 lb

## 2023-06-04 DIAGNOSIS — E041 Nontoxic single thyroid nodule: Secondary | ICD-10-CM

## 2023-06-04 LAB — TSH: TSH: 1.32 m[IU]/L

## 2023-06-04 LAB — T4, FREE: Free T4: 1.2 ng/dL (ref 0.8–1.8)

## 2023-06-04 NOTE — Progress Notes (Unsigned)
 Name: [name]  MRN/ DOB: [id number], January 22, 1966    Age/ Sex: 58 y.o., female     PCP: Loyola Mast, MD   Reason for Endocrinology Evaluation: Left thyroid nodule      Initial Endocrinology Clinic Visit: 05/21/2020    PATIENT IDENTIFIER: [name] is a 58 y.o., female with a past medical history of  T2DM and dyslipidemia. She has followed with [facility] since 05/21/2020 for consultative assistance with management of her left thyroid nodule .   HISTORICAL SUMMARY: She was diagnosed with left thyroid nodule on ultrasound 05/2020. She is S/P FNA of the left mid nodule 1.5 cm on 06/05/2020 with benign cytology, but scan cellularity , repeat ultrasound 05/2021 showed decrease in size from 1.5 to 1.1 cm   NO FH of thyroid disease    SUBJECTIVE:    Today (06/04/2023):  [name] is here for a follow up on left thyroid nodule.     Weight has been stable  Denies local neck swelling,  or dysphagia  Denies loose stools or diarrhea  Denies tremors Denies palpitations  Denies anxiety or jittery sensation      HISTORY:  Past Medical History:  Past Medical History:  Diagnosis Date   Abnormal uterine bleeding (AUB)    Anxiety    Arthritis    Diabetes mellitus    Elevated cholesterol    Endometrial polyp    Endometrial polyp    Endometriosis    Frozen shoulder    Hypertension    Migraines    Nodule    benign, thyroid   Ovarian cyst    Past Surgical History:  Past Surgical History:  Procedure Laterality Date   COMBINED HYSTEROSCOPY DIAGNOSTIC / D&C  yrs ago   DIAGNOSTIC LAPAROSCOPY  1993   with laser adhesions   DILITATION & CURRETTAGE/HYSTROSCOPY WITH NOVASURE ABLATION N/A 11/04/2019   Procedure: DILATATION & CURETTAGE/HYSTEROSCOPY WITH NOVASURE ABLATION;  Surgeon: Theresia Majors, MD;  Location: [facility] ;  Service: Gynecology;  Laterality: N/A;   INTRAUTERINE DEVICE INSERTION     mirena-Inserted  05-16-14   KNEE SURGERY Left yrs ago   meniscurs tear repair   mirena removed  2021   OOPHORECTOMY  2008   left   ROTATOR CUFF REPAIR Right yrs ago   SHOULDER ARTHROSCOPY WITH DISTAL CLAVICLE RESECTION Left 10/03/2022   Procedure: SHOULDER ARTHROSCOPY WITH DISTAL CLAVICLE RESECTION CAPSULE RELEASE, EXTENSIVE DEBRIDEMENT;  Surgeon: Yolonda Kida, MD;  Location: [facility];  Service: Orthopedics;  Laterality: Left;   WRIST SURGERY Right    gang. cyst   Social History:  reports that she has never smoked. She has never used smokeless tobacco. She reports that she does not currently use alcohol. She reports that she does not use drugs. Family History:  Family History  Problem Relation Age of Onset   Hypertension Mother    Diabetes Mother    COPD Mother    Cancer Mother        Lung   Hypertension Father    Stroke Father 13   Diabetes Sister    Hypertension Brother    Diabetes Maternal Aunt    Cancer Maternal Aunt        Kidney   Kidney disease Maternal Aunt    Diabetes Maternal Aunt    Stroke Maternal Grandfather    Diabetes Maternal Grandfather    Colon cancer Neg Hx    Esophageal cancer Neg Hx    Rectal  cancer Neg Hx    Stomach cancer Neg Hx      HOME MEDICATIONS: Allergies as of 06/04/2023       Reactions   Dilaudid [hydromorphone Hcl] Other (See Comments)   Broke in sweat and started shaking, can take oral   Sulfa Antibiotics Hives   Tylox [oxycodone-acetaminophen] Nausea And Vomiting   Can take plain tylenol   Clindamycin/lincomycin Rash   hives        Medication List        Accurate as of June 04, 2023  6:50 AM. If you have any questions, ask your nurse or doctor.          cholecalciferol 25 MCG (1000 UNIT) tablet Commonly known as: VITAMIN D3 Take 1,000 Units by mouth daily.   clonazePAM 0.5 MG tablet Commonly known as: KLONOPIN Take 1 tablet (0.5 mg total) by mouth 2 (two) times daily as needed for anxiety.   metFORMIN 500  MG tablet Commonly known as: GLUCOPHAGE TAKE 1 TABLET BY MOUTH EVERY DAY WITH BREAKFAST   multivitamin tablet Take 1 tablet by mouth daily. Gummy   olmesartan-hydrochlorothiazide 40-12.5 MG tablet Commonly known as: BENICAR HCT Take 1 tablet by mouth daily.   ondansetron 4 MG tablet Commonly known as: Zofran Take 1 tablet (4 mg total) by mouth every 8 (eight) hours as needed for nausea or vomiting.   Ozempic (0.25 or 0.5 MG/DOSE) 2 MG/3ML Sopn Generic drug: Semaglutide(0.25 or 0.5MG /DOS) Inject 0.25 mg into the skin once a week for 28 days, THEN 0.5 mg once a week for 28 days. Start taking on: May 19, 2023   Semaglutide (1 MG/DOSE) 4 MG/3ML Sopn Inject 1 mg as directed once a week. Start after 28 days on the 0.5 mg weekly dose.   PARoxetine 20 MG tablet Commonly known as: PAXIL TAKE 1 TABLET BY MOUTH EVERY DAY   rosuvastatin 10 MG tablet Commonly known as: CRESTOR TAKE 1 TABLET BY MOUTH EVERY DAY   vitamin C 1000 MG tablet Take 1,000 mg by mouth daily.   zinc gluconate 50 MG tablet Take 50 mg by mouth daily.          OBJECTIVE:   PHYSICAL EXAM: VS: LMP  (LMP Unknown)    EXAM: General: Pt appears well and is in NAD  Neck: General: Supple without adenopathy. Thyroid: Thyroid size normal. Left isthmic nodule palpated.   Heart: RRR  Chest : CTA  Abdomen: Soft, non tender   Mental Status: Judgment, insight: Intact Memory: Intact for recent and remote events Mood and affect: No depression, anxiety, or agitation     DATA REVIEWED:     Thyroid Ultrasound 06/20/2022  Estimated total number of nodules >/= 1 cm: 1   Number of spongiform nodules >/=  2 cm not described below (TR1): 0   Number of mixed cystic and solid nodules >/= 1.5 cm not described below (TR2): 0   _________________________________________________________   Similar appearance of previously biopsied left superior thyroid nodule (labeled 1, 1.4 cm, previously 1.1 cm). No new  discrete nodules. No cervical lymphadenopathy.   IMPRESSION: Similar appearance of previously biopsied left mid solid thyroid nodule. Recommend correlation with prior biopsy results   FNA Left Nodule  06/05/2020  FINAL MICROSCOPIC DIAGNOSIS:  - Consistent with benign follicular nodule (Bethesda category II)   ASSESSMENT / PLAN / RECOMMENDATIONS:   Left thyroid Nodule :  - Pt is clinically euthyroid  - No local neck symptoms  - S/P Benign FNA in 06/2020 -  Will repeat thyroid ultrasound    F/U in 1 yr   Signed electronically by: Lyndle Herrlich, MD  [facility]  [facility] [address] [place] [place], [place] [address] Phone: [phone] FAX: [phone]      CC: Loyola Mast, MD [address] [place] [address] Phone: [phone]  Fax: [phone]   Return to Endocrinology clinic as below: Future Appointments  Date Time Provider Department Center  06/04/2023  7:30 AM Ecko Beasley, Konrad Dolores, MD [facility] None  06/19/2023  3:00 PM O'Neal, Ronnald Ramp, MD [facility] None  08/18/2023  9:20 AM Loyola Mast, MD [facility]  12/21/2023  9:40 AM [facility] MOBILE MM 1 [facility] CE  04/27/2024  8:00 AM Rosalyn Gess, MD [facility] None

## 2023-06-05 ENCOUNTER — Encounter: Payer: Self-pay | Admitting: Internal Medicine

## 2023-06-12 ENCOUNTER — Ambulatory Visit
Admission: RE | Admit: 2023-06-12 | Discharge: 2023-06-12 | Disposition: A | Discharge status: Home / Self Care | Source: Ambulatory Visit | Attending: Internal Medicine | Admitting: Internal Medicine

## 2023-06-12 DIAGNOSIS — E041 Nontoxic single thyroid nodule: Secondary | ICD-10-CM

## 2023-06-15 ENCOUNTER — Encounter: Payer: Self-pay | Admitting: Internal Medicine

## 2023-06-18 NOTE — Progress Notes (Signed)
 Cardiology Office Note:  .   Date:  06/19/2023  ID:  [name], DOB 1965-12-23, MRN [id number] PCP: Graig Lawyer, MD  [facility] Providers Cardiologist:  Oneil Bigness, MD Cardiology APP:  Lamond Pilot, PA {  History of Present Illness: .    Chief Complaint  Patient presents with   Follow-up    [name] is a 58 y.o. female with history of DM, HTN, HLD who presents for follow-up.   History of Present Illness   [name] is a 58 year old female with diabetes, hypertension, and hyperlipidemia who presents for follow-up.  She has diabetes, and her management was recently adjusted with a switch to Ozempic , which she has been taking for three weeks, with today being her fourth dose. She is hopeful about the potential benefits of Ozempic , especially in terms of weight management, and mentions that her sister, who is also diabetic, is on Trulicity and has experienced weight loss. No chest pain or trouble breathing. She is not on any heart medications and wishes to maintain this status.  Her hypertension is well-controlled with Benicar . She is also on Crestor  (rosuvastatin ) 10 mg daily for hyperlipidemia, and her cholesterol levels are at goal. She reports no swelling in her legs and no changes in her medical history aside from the addition of Ozempic .  In terms of physical activity, she states that she is not exercising regularly but stays busy and is working on incorporating more exercise into her routine.          Problem List 1. HTN 2. DM -A1c 7.3 3. HLD -T chol 136, HDL 61, LDL 61, TG 66 4. Obesity -BMI 38    ROS: All other ROS reviewed and negative. Pertinent positives noted in the HPI.     Studies Reviewed: Aaron Aas   EKG Interpretation Date/Time:  Friday June 19 2023 14:59:27 EDT Ventricular Rate:  81 PR Interval:  160 QRS Duration:  92 QT Interval:  374 QTC Calculation: 434 R Axis:   -59  Text  Interpretation: Normal sinus rhythm with sinus arrhythmia Left axis deviation Incomplete right bundle branch block Confirmed by Jackquelyn Mass [phone]) on 06/19/2023 3:07:02 PM    CCTA 09/17/2020 IMPRESSION: 1. Coronary calcium  score of 0.   2. Normal coronary origin with right dominance.   3. No evidence of CAD.   CAD-RADS 0. No evidence of CAD (0%). Consider non-atherosclerotic causes of chest pain. Physical Exam:   VS:  BP 126/72   Pulse 81   Ht 4' 10.5" (1.486 m)   Wt 188 lb (85.3 kg)   LMP  (LMP Unknown)   SpO2 98%   BMI 38.62 kg/m    Wt Readings from Last 3 Encounters:  06/19/23 188 lb (85.3 kg)  06/04/23 184 lb (83.5 kg)  05/19/23 185 lb 9.6 oz (84.2 kg)    GEN: Well nourished, well developed in no acute distress NECK: No JVD; No carotid bruits CARDIAC: RRR, no murmurs, rubs, gallops RESPIRATORY:  Clear to auscultation without rales, wheezing or rhonchi  ABDOMEN: Soft, non-tender, non-distended EXTREMITIES:  No edema; No deformity  ASSESSMENT AND PLAN: .   Assessment and Plan    Diabetes Mellitus Diabetes management is a concern due to increasing blood glucose levels. Switched to Ozempic , expected to aid in weight loss and glycemic control. Encouraged collaboration with primary care physician. - Continue Ozempic  as prescribed. - Collaborate with primary care physician for diabetes management.  Hypertension Hypertension is well-controlled  with Benicar  and HCTZ. - Continue Benicar  and HCTZ as prescribed.  Hyperlipidemia Hyperlipidemia is well-managed. LDL at goal with Crestor  10 mg daily. Calcium  score is zero, indicating no current cardiovascular issues. - Continue Crestor  10 mg daily. - Goal LDL <70              Follow-up: Return if symptoms worsen or fail to improve.  Signed, Gigi Kyle. Rolm Clos, MD, [facility]  [facility]  [address], [address] [place], [place] [address] [phone]  3:15 PM

## 2023-06-19 ENCOUNTER — Ambulatory Visit: Payer: Managed Care, Other (non HMO) | Attending: Cardiovascular Disease | Admitting: Cardiovascular Disease

## 2023-06-19 ENCOUNTER — Encounter: Payer: Self-pay | Admitting: Cardiovascular Disease

## 2023-06-19 VITALS — BP 126/72 | HR 81 | Ht 58.5 in | Wt 188.0 lb

## 2023-06-19 DIAGNOSIS — I10 Essential (primary) hypertension: Secondary | ICD-10-CM | POA: Diagnosis not present

## 2023-06-19 DIAGNOSIS — E785 Hyperlipidemia, unspecified: Secondary | ICD-10-CM | POA: Diagnosis not present

## 2023-06-19 NOTE — Patient Instructions (Signed)
 Medication Instructions:  Your physician recommends that you continue on your current medications as directed. Please refer to the Current Medication list given to you today.    *If you need a refill on your cardiac medications before your next appointment, please call your pharmacy*   Lab Work:  NONE   If you have labs (blood work) drawn today and your tests are completely normal, you will receive your results only by: MyChart Message (if you have MyChart) OR A paper copy in the mail If you have any lab test that is abnormal or we need to change your treatment, we will call you to review the results.   Testing/Procedures: NONE    Follow-Up: At [facility], you and your health needs are our priority.  As part of our continuing mission to provide you with exceptional heart care, we have created designated Provider Care Teams.  These Care Teams include your primary Cardiologist (physician) and Advanced Practice Providers (APPs -  Physician Assistants and Nurse Practitioners) who all work together to provide you with the care you need, when you need it.  We recommend signing up for the patient portal called MyChart.  Sign up information is provided on this After Visit Summary.  MyChart is used to connect with patients for Virtual Visits (Telemedicine).  Patients are able to view lab/test results, encounter notes, upcoming appointments, etc.  Non-urgent messages can be sent to your provider as well.   To learn more about what you can do with MyChart, go to [email or web address].    Your next appointment:   Call or return to clinic as needed if these symptoms worsen or fail to improve as anticipated.    Provider:   Darryle ONEIDA Decent, MD    Other Instructions

## 2023-06-29 ENCOUNTER — Encounter: Payer: Self-pay | Admitting: Family Medicine

## 2023-07-13 ENCOUNTER — Ambulatory Visit (INDEPENDENT_AMBULATORY_CARE_PROVIDER_SITE_OTHER): Admitting: Obstetrics and Gynecology

## 2023-07-13 ENCOUNTER — Encounter: Payer: Self-pay | Admitting: Obstetrics and Gynecology

## 2023-07-13 VITALS — BP 112/84 | HR 84 | Temp 98.3°F

## 2023-07-13 DIAGNOSIS — N760 Acute vaginitis: Secondary | ICD-10-CM

## 2023-07-13 DIAGNOSIS — B9689 Other specified bacterial agents as the cause of diseases classified elsewhere: Secondary | ICD-10-CM

## 2023-07-13 DIAGNOSIS — N898 Other specified noninflammatory disorders of vagina: Secondary | ICD-10-CM

## 2023-07-13 DIAGNOSIS — N3941 Urge incontinence: Secondary | ICD-10-CM

## 2023-07-13 LAB — WET PREP FOR TRICH, YEAST, CLUE

## 2023-07-13 MED ORDER — PHENAZOPYRIDINE HCL 200 MG PO TABS: 200.0000 mg | ORAL_TABLET | Freq: Three times a day (TID) | ORAL | 0 refills | Status: AC | PRN

## 2023-07-13 MED ORDER — METRONIDAZOLE 0.75 % VA GEL: 1.0000 | Freq: Every day | VAGINAL | 0 refills | Status: AC

## 2023-07-13 NOTE — Progress Notes (Signed)
 58 y.o. G33P1011 female here for urinary and vaginal sx. Married.  No LMP recorded (lmp unknown). Patient is postmenopausal.   Pt reports sharp RLQ pains with voiding, urinary urgency, and frequency. Startesd yesterday. Hx of recurrent BV. Using boric acid when notices scent.  Has used scented soaps recently. Sexually active: once over the past year   GYN HISTORY: No significant history   OB History  Gravida Para Term Preterm AB Living  2 1 1  1 1   SAB IAB Ectopic Multiple Live Births          # Outcome Date GA Lbr Len/2nd Weight Sex Type Anes PTL Lv  2 AB           1 Term             Past Medical History:  Diagnosis Date   Abnormal uterine bleeding (AUB)    Anxiety    Arthritis    Diabetes mellitus    Elevated cholesterol    Endometrial polyp    Endometrial polyp    Endometriosis    Frozen shoulder    Hypertension    Migraines    Nodule    benign, thyroid    Ovarian cyst     Past Surgical History:  Procedure Laterality Date   COMBINED HYSTEROSCOPY DIAGNOSTIC / D&C  yrs ago   DIAGNOSTIC LAPAROSCOPY  1993   with laser adhesions   DILITATION & CURRETTAGE/HYSTROSCOPY WITH NOVASURE ABLATION N/A 11/04/2019   Procedure: DILATATION & CURETTAGE/HYSTEROSCOPY WITH NOVASURE ABLATION;  Surgeon: Mallory Seaman, MD;  Location: [facility];  Service: Gynecology;  Laterality: N/A;   INTRAUTERINE DEVICE INSERTION     mirena -Inserted 05-16-14   KNEE SURGERY Left yrs ago   meniscurs tear repair   mirena  removed  2021   OOPHORECTOMY  2008   left   ROTATOR CUFF REPAIR Right yrs ago   SHOULDER ARTHROSCOPY WITH DISTAL CLAVICLE RESECTION Left 10/03/2022   Procedure: SHOULDER ARTHROSCOPY WITH DISTAL CLAVICLE RESECTION CAPSULE RELEASE, EXTENSIVE DEBRIDEMENT;  Surgeon: Janeth Medicus, MD;  Location: [facility];  Service: Orthopedics;  Laterality: Left;   WRIST SURGERY Right    gang. cyst    Current Outpatient Medications on File Prior to  Visit  Medication Sig Dispense Refill   Ascorbic Acid (VITAMIN C) 1000 MG tablet Take 1,000 mg by mouth daily.     cholecalciferol (VITAMIN D3) 25 MCG (1000 UNIT) tablet Take 1,000 Units by mouth daily.     clonazePAM  (KLONOPIN ) 0.5 MG tablet Take 1 tablet (0.5 mg total) by mouth 2 (two) times daily as needed for anxiety. 30 tablet 1   metFORMIN  (GLUCOPHAGE ) 500 MG tablet TAKE 1 TABLET BY MOUTH EVERY DAY WITH BREAKFAST 90 tablet 3   Multiple Vitamin (MULTIVITAMIN) tablet Take 1 tablet by mouth daily. Gummy     olmesartan -hydrochlorothiazide  (BENICAR  HCT) 40-12.5 MG tablet Take 1 tablet by mouth daily. 90 tablet 3   ondansetron  (ZOFRAN ) 4 MG tablet Take 1 tablet (4 mg total) by mouth every 8 (eight) hours as needed for nausea or vomiting. 20 tablet 0   PARoxetine  (PAXIL ) 20 MG tablet TAKE 1 TABLET BY MOUTH EVERY DAY 90 tablet 3   rosuvastatin  (CRESTOR ) 10 MG tablet TAKE 1 TABLET BY MOUTH EVERY DAY 90 tablet 3   Semaglutide , 1 MG/DOSE, 4 MG/3ML SOPN Inject 1 mg as directed once a week. Start after 28 days on the 0.5 mg weekly dose. 3 mL 0   Semaglutide ,0.25 or 0.5MG /DOS, (OZEMPIC ,  0.25 OR 0.5 MG/DOSE,) 2 MG/3ML SOPN Inject 0.25 mg into the skin once a week for 28 days, THEN 0.5 mg once a week for 28 days. 6 mL 0   zinc gluconate 50 MG tablet Take 50 mg by mouth daily.     No current facility-administered medications on file prior to visit.    Allergies  Allergen Reactions   Dilaudid  [Hydromorphone  Hcl] Other (See Comments)    Broke in sweat and started shaking, can take oral   Sulfa Antibiotics Hives   Tylox [Oxycodone -Acetaminophen ] Nausea And Vomiting    Can take plain tylenol    Clindamycin/Lincomycin Rash    hives      PE Today's Vitals   07/13/23 1620  BP: 112/84  Pulse: 84  Temp: 98.3 F (36.8 C)  TempSrc: Oral  SpO2: 96%   There is no height or weight on file to calculate BMI.  Physical Exam Vitals reviewed. Exam conducted with a chaperone present.  Constitutional:       General: She is not in acute distress.    Appearance: Normal appearance.  HENT:     Head: Normocephalic and atraumatic.     Nose: Nose normal.  Eyes:     Extraocular Movements: Extraocular movements intact.     Conjunctiva/sclera: Conjunctivae normal.  Pulmonary:     Effort: Pulmonary effort is normal.  Genitourinary:    General: Normal vulva.     Exam position: Lithotomy position.     Vagina: Normal. No vaginal discharge.     Cervix: Normal. No cervical motion tenderness, discharge or lesion.     Uterus: Normal. Not enlarged and not tender.      Adnexa: Right adnexa normal and left adnexa normal.  Musculoskeletal:        General: Normal range of motion.     Cervical back: Normal range of motion.  Neurological:     General: No focal deficit present.     Mental Status: She is alert.  Psychiatric:        Mood and Affect: Mood normal.        Behavior: Behavior normal.      Assessment and Plan:        Urgency incontinence -     Urinalysis,Complete w/RFL Culture -     Phenazopyridine HCl; Take 1 tablet (200 mg total) by mouth 3 (three) times daily as needed for up to 2 days for pain.  Dispense: 6 tablet; Refill: 0  Vaginal irritation -     WET PREP FOR TRICH, YEAST, CLUE  BV (bacterial vaginosis) -     metroNIDAZOLE ; Place 1 Applicatorful vaginally at bedtime for 5 days.  Dispense: 50 g; Refill: 0   Increase hydration.  Romaine Closs, MD

## 2023-07-14 LAB — URINALYSIS, COMPLETE W/RFL CULTURE
Bacteria, UA: NONE SEEN /HPF
Bilirubin Urine: NEGATIVE
Glucose, UA: NEGATIVE
Hyaline Cast: NONE SEEN /LPF
Ketones, ur: NEGATIVE
Leukocyte Esterase: NEGATIVE
Nitrites, Initial: NEGATIVE
Protein, ur: NEGATIVE
Specific Gravity, Urine: 1.01 (ref 1.001–1.035)
pH: 7 (ref 5.0–8.0)

## 2023-07-14 LAB — URINE CULTURE
MICRO NUMBER:: 16442714
Result:: NO GROWTH
SPECIMEN QUALITY:: ADEQUATE

## 2023-07-14 LAB — CULTURE INDICATED

## 2023-07-15 ENCOUNTER — Ambulatory Visit: Payer: Self-pay | Admitting: Obstetrics and Gynecology

## 2023-08-18 ENCOUNTER — Ambulatory Visit (INDEPENDENT_AMBULATORY_CARE_PROVIDER_SITE_OTHER): Admitting: Family Medicine

## 2023-08-18 ENCOUNTER — Ambulatory Visit: Payer: Self-pay | Admitting: Family Medicine

## 2023-08-18 ENCOUNTER — Encounter: Payer: Self-pay | Admitting: Family Medicine

## 2023-08-18 ENCOUNTER — Ambulatory Visit: Admitting: Family Medicine

## 2023-08-18 VITALS — BP 124/82 | HR 85 | Temp 97.5°F | Ht 58.5 in | Wt 179.2 lb

## 2023-08-18 DIAGNOSIS — I10 Essential (primary) hypertension: Secondary | ICD-10-CM

## 2023-08-18 DIAGNOSIS — E119 Type 2 diabetes mellitus without complications: Secondary | ICD-10-CM | POA: Diagnosis not present

## 2023-08-18 DIAGNOSIS — Z6836 Body mass index (BMI) 36.0-36.9, adult: Secondary | ICD-10-CM

## 2023-08-18 DIAGNOSIS — F418 Other specified anxiety disorders: Secondary | ICD-10-CM

## 2023-08-18 DIAGNOSIS — Z7985 Long-term (current) use of injectable non-insulin antidiabetic drugs: Secondary | ICD-10-CM

## 2023-08-18 DIAGNOSIS — E785 Hyperlipidemia, unspecified: Secondary | ICD-10-CM | POA: Diagnosis not present

## 2023-08-18 DIAGNOSIS — E66812 Obesity, class 2: Secondary | ICD-10-CM

## 2023-08-18 DIAGNOSIS — G44209 Tension-type headache, unspecified, not intractable: Secondary | ICD-10-CM | POA: Insufficient documentation

## 2023-08-18 DIAGNOSIS — Z7984 Long term (current) use of oral hypoglycemic drugs: Secondary | ICD-10-CM

## 2023-08-18 DIAGNOSIS — G5711 Meralgia paresthetica, right lower limb: Secondary | ICD-10-CM

## 2023-08-18 LAB — GLUCOSE, RANDOM: Glucose, Bld: 80 mg/dL (ref 70–99)

## 2023-08-18 LAB — HEMOGLOBIN A1C: Hgb A1c MFr Bld: 6.6 % — ABNORMAL HIGH (ref 4.6–6.5)

## 2023-08-18 MED ORDER — METFORMIN HCL 500 MG PO TABS: 500.0000 mg | ORAL_TABLET | Freq: Every day | ORAL | 3 refills | Status: DC

## 2023-08-18 MED ORDER — ROSUVASTATIN CALCIUM 10 MG PO TABS: 10.0000 mg | ORAL_TABLET | Freq: Every day | ORAL | 3 refills | Status: AC

## 2023-08-18 MED ORDER — SEMAGLUTIDE (1 MG/DOSE) 4 MG/3ML ~~LOC~~ SOPN: 1.0000 mg | PEN_INJECTOR | SUBCUTANEOUS | 0 refills | Status: DC

## 2023-08-18 MED ORDER — CLONAZEPAM 0.5 MG PO TABS
0.5000 mg | ORAL_TABLET | Freq: Every day | ORAL | 0 refills | Status: AC | PRN
Start: 2023-08-18 — End: ?

## 2023-08-18 NOTE — Assessment & Plan Note (Signed)
 Lipids are at goal. Continue rosuvastatin 10 mg daily.

## 2023-08-18 NOTE — Patient Instructions (Addendum)
   Meralgia paresthetica is a condition that causes tingling, numbness and burning pain in the outer thigh. It's caused by compression of the nerve that provides feeling to the skin covering the thigh. Meralgia paresthetica also is known as lateral femoral cutaneous nerve entrapment.  Tight clothing, obesity or weight gain, and pregnancy are common causes of meralgia paresthetica. But meralgia paresthetica also can be due to an injury or a disease such as diabetes.  Meralgia paresthetica often can be relieved with conservative measures, including wearing looser clothing. If symptoms aren't relieved by those measures, treatment may include medicines. Rarely, surgery is needed.  Symptoms Meralgia paresthetica may cause these symptoms in the outer part of the thigh:  Tingling. Burning pain. Decreased feeling or numbness. Increased sensitivity and pain to even a light touch. These symptoms commonly occur on one side of your body and might intensify after walking or standing.  Causes Meralgia paresthetica occurs when the lateral femoral cutaneous nerve is pinched, also known as compression. The nerve supplies feeling to the surface of the outer thigh. The nerve only affects sensation and doesn't impact your ability to use your leg muscles.  In most people, this nerve passes through the groin to the upper thigh without trouble. But in meralgia paresthetica, the lateral femoral cutaneous nerve becomes trapped. Often the inguinal ligament pinches the nerve. This ligament runs along the groin from the stomach to the upper thigh.  Common causes of this compression include any condition that increases pressure on the groin, including:  Tight clothing, such as belts, corsets and tight pants. Obesity or weight gain. Wearing a heavy tool belt. Pregnancy. Fluid accumulation in the abdomen causing increased abdominal pressure. Scar tissue near the inguinal ligament due to injury or past surgery. Nerve  injury also can cause meralgia paresthetica. Nerve injury can be due to diabetes, trauma after surgery or seat belt injury after a motor vehicle accident.  Risk factors The following might increase your risk of meralgia paresthetica:  Extra weight. Being overweight or obese can increase the pressure on your lateral femoral cutaneous nerve. Pregnancy. A growing belly puts added pressure on your groin, through which the lateral femoral cutaneous nerve passes. Diabetes. Diabetes-related nerve injury can lead to meralgia paresthetica. Age. People between ages 24 and 57 are at a higher risk.  Treatment For most people, the symptoms of meralgia paresthetica ease in a few months. Treatment focuses on relieving nerve compression.  Conservative measures Conservative measures include:  Wearing looser clothing. Losing excess weight. Taking pain relievers available without a prescription. They might include acetaminophen  (Tylenol , others), ibuprofen  (Advil , Motrin  IB, others) or aspirin . Medications  If symptoms last for more than two months or if your pain doesn't go away with conservative measures, treatment might include:  Corticosteroid injections. Injections can reduce inflammation and relieve pain for a short time. Possible side effects include joint infection, nerve damage, pain and lightening of skin around the injection site.  Tricyclic antidepressants. These medicines might relieve your pain. Side effects include drowsiness, dry mouth, constipation and impaired sexual functioning.  Gabapentin  (Gralise , Neurontin ), phenytoin (Dilantin, Phenytek) or pregabalin (Lyrica). These anti-seizure medicines might help lessen pain. Side effects include constipation, nausea, dizziness, drowsiness and lightheadedness.  Surgery Rarely, surgery to decompress the nerve may be considered. This option is only for people with very painful and long-lasting symptoms.

## 2023-08-18 NOTE — Assessment & Plan Note (Signed)
 Blood pressure is in good control. Continue olmesartan -HCTZ (Benicar  HCT) 40-12.5 mg daily.

## 2023-08-18 NOTE — Assessment & Plan Note (Signed)
 We will check an A1c today. Continue metformin  500 mg daily and  semaglutide  (Ozempic ) 1 mg weekly.

## 2023-08-18 NOTE — Assessment & Plan Note (Signed)
 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 I provided [name] with recommendations for weight loss and avoiding constrictive clothing.

## 2023-08-18 NOTE — Assessment & Plan Note (Signed)
 Well managed on paroxetine  20 mg daily. Will renew some limited clonazepam  for management of anxiety attacks.

## 2023-08-18 NOTE — Progress Notes (Signed)
 [facility] [address] [place] [address] Dept: [phone] Dept Fax: [phone]  Chronic Care Office Visit  Subjective:    Patient ID: [name], female    DOB: 10/28/1965, 58 y.o..   MRN: [id number]  Chief Complaint  Patient presents with   Hypertension    3 month f/u.  Down 9 lbs.  C/o having an over-sensitive feeling to things touching her RT upper thigh x months.   Referral for Nutritionist   History of Present Illness:  Patient is in today for reassessment of chronic medical issues.  [name] has a history of hypertension. She is managed on olmesartan -HCTZ (Benicar  HCT) 40-12.5 mg daily.    [name] has a history of Type 2 diabetes. She is managed on semaglutide  (Ozempic ) 1 mg weekly (started at her last visit and titrated up to current dose) and metformin  500 mg daily.  She is tolerating the new medicine well.    [name] has a history of hyperlipidemia. She is managed on rosuvastatin  10 mg daily.  [name] notes an area on the outer aspect of her right thigh that has a strange sensation periodically. She does not classify this as a numbness. She notes this sometimes feels uncomfortable with clothing against it. She is not having low back pain, leg pain or numbness or tingling of other areas.  [name] has episodic headaches. These occur across her frontal scalp. She does not feel her hair is too tightly woven, noting she is careful about this. She has a history of migraine headaches.  These headaches feel different. She does not get associated nausea. These are relieved with Tylenol .  Past Medical History: Patient Active Problem List   Diagnosis Date Noted   Meralgia paraesthetica, right 08/18/2023   Tension headache 08/18/2023   Nonintractable headache 12/08/2022   Depression with anxiety 09/27/2020   Migraine headache 09/27/2020   Insomnia 09/27/2020   Thyroid  nodule-  benign follicular 09/27/2020   Class 2 obesity due to excess calories with body mass index (BMI) of 36.0 to 36.9 in adult 03/09/2019   Acute right-sided low back pain with right-sided sciatica 06/10/2017   Trochanteric bursitis, right hip 05/27/2017   Atypical chest pain 11/18/2012   Type 2 diabetes mellitus (HCC) 11/18/2012   Essential hypertension 11/18/2012   Hyperlipidemia    Past Surgical History:  Procedure Laterality Date   COMBINED HYSTEROSCOPY DIAGNOSTIC / D&C  yrs ago   DIAGNOSTIC LAPAROSCOPY  1993   with laser adhesions   DILITATION & CURRETTAGE/HYSTROSCOPY WITH NOVASURE ABLATION N/A 11/04/2019   Procedure: DILATATION & CURETTAGE/HYSTEROSCOPY WITH NOVASURE ABLATION;  Surgeon: Mallory Seaman, MD;  Location: [facility];  Service: Gynecology;  Laterality: N/A;   INTRAUTERINE DEVICE INSERTION     mirena -Inserted 05-16-14   KNEE SURGERY Left yrs ago   meniscurs tear repair   mirena  removed  2021   OOPHORECTOMY  2008   left   ROTATOR CUFF REPAIR Right yrs ago   SHOULDER ARTHROSCOPY WITH DISTAL CLAVICLE RESECTION Left 10/03/2022   Procedure: SHOULDER ARTHROSCOPY WITH DISTAL CLAVICLE RESECTION CAPSULE RELEASE, EXTENSIVE DEBRIDEMENT;  Surgeon: Janeth Medicus, MD;  Location: [facility];  Service: Orthopedics;  Laterality: Left;   WRIST SURGERY Right    gang. cyst   Family History  Problem Relation Age of Onset   Hypertension Mother    Diabetes Mother    COPD Mother    Cancer Mother  Lung   Hypertension Father    Stroke Father 32   Diabetes Sister    Hypertension Brother    Diabetes Maternal Aunt    Cancer Maternal Aunt        Kidney   Kidney disease Maternal Aunt    Diabetes Maternal Aunt    Stroke Maternal Grandfather    Diabetes Maternal Grandfather    Colon cancer Neg Hx    Esophageal cancer Neg Hx    Rectal cancer Neg Hx    Stomach cancer Neg Hx    Outpatient Medications Prior to Visit  Medication Sig Dispense  Refill   Ascorbic Acid (VITAMIN C) 1000 MG tablet Take 1,000 mg by mouth daily.     cholecalciferol (VITAMIN D3) 25 MCG (1000 UNIT) tablet Take 1,000 Units by mouth daily.     Multiple Vitamin (MULTIVITAMIN) tablet Take 1 tablet by mouth daily. Gummy     olmesartan -hydrochlorothiazide  (BENICAR  HCT) 40-12.5 MG tablet Take 1 tablet by mouth daily. 90 tablet 3   ondansetron  (ZOFRAN ) 4 MG tablet Take 1 tablet (4 mg total) by mouth every 8 (eight) hours as needed for nausea or vomiting. 20 tablet 0   PARoxetine  (PAXIL ) 20 MG tablet TAKE 1 TABLET BY MOUTH EVERY DAY (Patient taking differently: Take 20 mg by mouth daily. Taking every other day) 90 tablet 3   zinc gluconate 50 MG tablet Take 50 mg by mouth daily.     clonazePAM  (KLONOPIN ) 0.5 MG tablet Take 1 tablet (0.5 mg total) by mouth 2 (two) times daily as needed for anxiety. 30 tablet 1   metFORMIN  (GLUCOPHAGE ) 500 MG tablet TAKE 1 TABLET BY MOUTH EVERY DAY WITH BREAKFAST 90 tablet 3   rosuvastatin  (CRESTOR ) 10 MG tablet TAKE 1 TABLET BY MOUTH EVERY DAY 90 tablet 3   Semaglutide , 1 MG/DOSE, 4 MG/3ML SOPN Inject 1 mg as directed once a week. Start after 28 days on the 0.5 mg weekly dose. 3 mL 0   No facility-administered medications prior to visit.   Allergies  Allergen Reactions   Dilaudid  [Hydromorphone  Hcl] Other (See Comments)    Broke in sweat and started shaking, can take oral   Sulfa Antibiotics Hives   Tylox [Oxycodone -Acetaminophen ] Nausea And Vomiting    Can take plain tylenol    Clindamycin/Lincomycin Rash    hives   Objective:   Today's Vitals   08/18/23 0911  BP: 124/82  Pulse: 85  Temp: (!) 97.5 F (36.4 C)  TempSrc: Temporal  SpO2: 97%  Weight: 179 lb 3.2 oz (81.3 kg)  Height: 4' 10.5 (1.486 m)   Body mass index is 36.82 kg/m.   General: Well developed, well nourished. No acute distress. HEENT: Normocephalic, non-traumatic. PERRL, EOMI.  Extremities: Full ROM. No joint swelling or tenderness. No edema  noted. Neuro: CN II-XII grossly intact. Normal sensation and DTR bilaterally. Psych: Alert and oriented. Normal mood and affect.  There are no preventive care reminders to display for this patient.    Assessment & Plan:   Problem List Items Addressed This Visit       Cardiovascular and Mediastinum   Essential hypertension - Primary   Blood pressure is in good control. Continue olmesartan -HCTZ (Benicar  HCT) 40-12.5 mg daily.       Relevant Medications   rosuvastatin  (CRESTOR ) 10 MG tablet     Endocrine   Type 2 diabetes mellitus (HCC)   We will check an A1c today. Continue metformin  500 mg daily and  semaglutide  (Ozempic ) 1 mg weekly.  Relevant Medications   metFORMIN  (GLUCOPHAGE ) 500 MG tablet   rosuvastatin  (CRESTOR ) 10 MG tablet   Semaglutide , 1 MG/DOSE, 4 MG/3ML SOPN   Other Relevant Orders   Glucose, random (Completed)   Hemoglobin A1c (Completed)   Amb Referral to Nutrition and Diabetic Education     Nervous and Auditory   Meralgia paraesthetica, right   Symptoms are consistent with a lateral femoral cutaneous nerve issue. I provided Ms. Puder with recommendations for weight loss and avoiding constrictive clothing.      Relevant Medications   clonazePAM  (KLONOPIN ) 0.5 MG tablet     Other   Class 2 obesity due to excess calories with body mass index (BMI) of 36.0 to 36.9 in adult   Maximum weight: 195 lbs (02/2020) Current weight: 179 lbs Weight change since last visit: - 6 lbs Total weight loss: - 16 lbs (8.2 %)  Weight loss likely related to Ozempic  use. I will refer her to nutrition. Continue to monitor this.      Relevant Medications   metFORMIN  (GLUCOPHAGE ) 500 MG tablet   Semaglutide , 1 MG/DOSE, 4 MG/3ML SOPN   Other Relevant Orders   Amb Referral to Nutrition and Diabetic Education   Depression with anxiety   Well managed on paroxetine  20 mg daily. Will renew some limited clonazepam  for management of anxiety attacks.      Relevant  Medications   clonazePAM  (KLONOPIN ) 0.5 MG tablet   Hyperlipidemia   Lipids are at goal. Continue rosuvastatin  10 mg daily.      Relevant Medications   rosuvastatin  (CRESTOR ) 10 MG tablet   Tension headache   Headaches appear to be tension related. I recommend she continue use of Tylenol  as needed.      Relevant Medications   clonazePAM  (KLONOPIN ) 0.5 MG tablet    Return in about 3 months (around 11/18/2023) for Reassessment.   Graig Lawyer, MD

## 2023-08-18 NOTE — Assessment & Plan Note (Signed)
 Maximum weight: 195 lbs (02/2020) Current weight: 179 lbs Weight change since last visit: - 6 lbs Total weight loss: - 16 lbs (8.2 %)  Weight loss likely related to Ozempic  use. I will refer her to nutrition. Continue to monitor this.

## 2023-08-18 NOTE — Assessment & Plan Note (Signed)
 Headaches appear to be tension related. I recommend she continue use of Tylenol  as needed.

## 2023-09-25 ENCOUNTER — Encounter: Payer: Self-pay | Admitting: Family Medicine

## 2023-09-25 DIAGNOSIS — E119 Type 2 diabetes mellitus without complications: Secondary | ICD-10-CM

## 2023-09-25 MED ORDER — SEMAGLUTIDE (1 MG/DOSE) 4 MG/3ML ~~LOC~~ SOPN: 1.0000 mg | PEN_INJECTOR | SUBCUTANEOUS | 3 refills | Status: DC

## 2023-09-30 ENCOUNTER — Encounter: Payer: Self-pay | Admitting: Dietician

## 2023-09-30 ENCOUNTER — Encounter: Attending: Family Medicine | Admitting: Dietician

## 2023-09-30 VITALS — Wt 175.0 lb

## 2023-09-30 DIAGNOSIS — E119 Type 2 diabetes mellitus without complications: Secondary | ICD-10-CM | POA: Insufficient documentation

## 2023-09-30 NOTE — Patient Instructions (Signed)
 Goals Established by Patient:   Goal 1: Exercise 2 times a week for 15 minutes.  Goal 2: incorporate a protein with meals and snacks.   Aim to follow scheduled meal and snack times.  -Breakfast -Snack (spaced 2 hours between) -Lunch -Snack (spaced 2 hours between) -Dinner   When snacking, aim to include a complex carb and protein.  At meals, aim to include 1/2 plate non-starchy vegetables, 1/4 plate protein, and 1/4 plate complex carbs.

## 2023-09-30 NOTE — Progress Notes (Unsigned)
 Diabetes Self-Management Education  Visit Type: First/Initial  Appt. Start Time: 1700 Appt. End Time: 1745  09/30/2023  [name], identified by name and date of birth, is a 58 y.o. female with a diagnosis of Diabetes: Type 2.   ASSESSMENT  History includes: anxiety, arthritis, type 2 diabetes, HLD, HTN, thyroid  disease Labs noted: 08/18/23: A1c 6.6% Medications include: metformin , semaglutide  Supplements: vitamin D , vitamin C, MVI  Pt reports she has never worked with a dietitian before so she wants to learn how she should be eating to best control blood sugar.   Pt states she has issues with her hips and back, preventing her from comfortable exercise.   Pt reports her appetite has greatly decreased since starting ozempic . Pt reports some times she does not feel like eating.   Pt reports she occasionally gets low blood glucose. Pt reports once her blood glucose was 71mg /dL and she felt weak. Pt reports she is checking her blood glucose every other day.   Pt states she may either pack her lunch for work or go out. Pt reports most days getting panera or [place] on the way to work.   Pt reports before her shoulder surgery last August she was more active and would take exercise classes and line dance.   Weight 175 lb (79.4 kg). Body mass index is 35.95 kg/m.   Diabetes Self-Management Education - 09/30/23 1704       Visit Information   Visit Type First/Initial      Initial Visit   Diabetes Type Type 2    Date Diagnosed unsure    Are you currently following a meal plan? No    Are you taking your medications as prescribed? Yes      Health Coping   How would you rate your overall health? Fair      Psychosocial Assessment   Patient Belief/Attitude about Diabetes Motivated to manage diabetes    What is the hardest part about your diabetes right now, causing you the most concern, or is the most worrisome to you about your diabetes?   Making healty food and  beverage choices    Self-care barriers None    Self-management support Doctor's office    Other persons present Patient    Patient Concerns Nutrition/Meal planning    Special Needs None    Preferred Learning Style No preference indicated    Learning Readiness Ready    How often do you need to have someone help you when you read instructions, pamphlets, or other written materials from your doctor or pharmacy? 1 - Never    What is the last grade level you completed in school? some college      Pre-Education Assessment   Patient understands the diabetes disease and treatment process. Needs Instruction    Patient understands incorporating nutritional management into lifestyle. Needs Instruction    Patient undertands incorporating physical activity into lifestyle. Needs Instruction    Patient understands using medications safely. Needs Instruction    Patient understands monitoring blood glucose, interpreting and using results Needs Instruction    Patient understands prevention, detection, and treatment of acute complications. Needs Instruction    Patient understands prevention, detection, and treatment of chronic complications. Needs Instruction    Patient understands how to develop strategies to address psychosocial issues. Needs Instruction    Patient understands how to develop strategies to promote health/change behavior. Needs Instruction      Complications   Last HgB A1C per patient/outside source 6.6 %  How often do you check your blood sugar? 3-4 times / week    Fasting Blood glucose range (mg/dL) 29-870    Have you had a dilated eye exam in the past 12 months? Yes    Have you had a dental exam in the past 12 months? Yes    Are you checking your feet? N/A      Dietary Intake   Breakfast 9am: panera bagel OR [place] sausage and egg white english muffin    Snack (morning) none OR nabs crackers OR fruit    Lunch 1pm: grilled chicken and broccoli OR [employer] kids meal grilled chicken,  broccoli, and sweet potato    Snack (afternoon) none    Dinner salad OR pizza    Snack (evening) oreo OR peanuts    Beverage(s) mushroom coffee and dairy free creamer, 80 oz water      Activity / Exercise   Activity / Exercise Type ADL's      Patient Education   Previous Diabetes Education No    Disease Pathophysiology Factors that contribute to the development of diabetes;Explored patient's options for treatment of their diabetes    Healthy Eating Role of diet in the treatment of diabetes and the relationship between the three main macronutrients and blood glucose level;Plate Method;Reviewed blood glucose goals for pre and post meals and how to evaluate the patients' food intake on their blood glucose level.;Information on hints to eating out and maintain blood glucose control.;Meal options for control of blood glucose level and chronic complications.;Meal timing in regards to the patients' current diabetes medication.    Being Active Role of exercise on diabetes management, blood pressure control and cardiac health.;Helped patient identify appropriate exercises in relation to his/her diabetes, diabetes complications and other health issue.    Medications Reviewed patients medication for diabetes, action, purpose, timing of dose and side effects.    Monitoring Purpose and frequency of SMBG.    Acute complications Taught prevention, symptoms, and  treatment of hypoglycemia - the 15 rule.    Chronic complications Relationship between chronic complications and blood glucose control;Identified and discussed with patient  current chronic complications    Diabetes Stress and Support Identified and addressed patients feelings and concerns about diabetes;Worked with patient to identify barriers to care and solutions;Role of stress on diabetes    Lifestyle and Health Coping Lifestyle issues that need to be addressed for better diabetes care      Individualized Goals (developed by patient)   Nutrition  General guidelines for healthy choices and portions discussed    Physical Activity Exercise 1-2 times per week;15 minutes per day;30 minutes per day    Medications take my medication as prescribed    Monitoring  Test my blood glucose as discussed    Problem Solving Eating Pattern    Reducing Risk examine blood glucose patterns;do foot checks daily;treat hypoglycemia with 15 grams of carbs if blood glucose less than 70mg /dL    Health Coping Ask for help with psychological, social, or emotional issues      Post-Education Assessment   Patient understands the diabetes disease and treatment process. Comprehends key points    Patient understands incorporating nutritional management into lifestyle. Comprehends key points    Patient undertands incorporating physical activity into lifestyle. Comprehends key points    Patient understands using medications safely. Comphrehends key points    Patient understands monitoring blood glucose, interpreting and using results Comprehends key points    Patient understands prevention, detection, and treatment of acute  complications. Comprehends key points    Patient understands prevention, detection, and treatment of chronic complications. Comprehends key points    Patient understands how to develop strategies to address psychosocial issues. Comprehends key points    Patient understands how to develop strategies to promote health/change behavior. Comprehends key points      Outcomes   Expected Outcomes Demonstrated interest in learning. Expect positive outcomes    Future DMSE 2 months    Program Status Not Completed          Individualized Plan for Diabetes Self-Management Training:   Learning Objective:  Patient will have a greater understanding of diabetes self-management. Patient education plan is to attend individual and/or group sessions per assessed needs and concerns.   Plan:   Patient Instructions  Goals Established by Patient:   Goal 1:  Exercise 2 times a week for 15 minutes.  Goal 2: incorporate a protein with meals and snacks.   Aim to follow scheduled meal and snack times.  -Breakfast -Snack (spaced 2 hours between) -Lunch -Snack (spaced 2 hours between) -Dinner   When snacking, aim to include a complex carb and protein.  At meals, aim to include 1/2 plate non-starchy vegetables, 1/4 plate protein, and 1/4 plate complex carbs.   Expected Outcomes:  Demonstrated interest in learning. Expect positive outcomes  Education material provided: ADA - How to Thrive: A Guide for Your Journey with Diabetes, My Plate, and Snack sheet  If problems or questions, patient to contact team via:  Phone  Future DSME appointment: 2 months

## 2023-10-23 ENCOUNTER — Telehealth: Payer: Self-pay

## 2023-10-23 ENCOUNTER — Other Ambulatory Visit (HOSPITAL_COMMUNITY): Payer: Self-pay

## 2023-10-23 NOTE — Telephone Encounter (Signed)
 Pharmacy Patient Advocate Encounter   Received notification from Onbase that prior authorization for Ozempic  (1 MG/DOSE) 4MG /3ML pen-injectors  is required/requested.   Insurance verification completed.   The patient is insured through [facility] .   Per test claim: PA required; PA submitted to above mentioned insurance via Latent Key/confirmation #/EOC [id number] Status is pending

## 2023-10-26 ENCOUNTER — Other Ambulatory Visit (HOSPITAL_COMMUNITY): Payer: Self-pay

## 2023-10-26 NOTE — Telephone Encounter (Signed)
 Pharmacy Patient Advocate Encounter  Received notification from [facility] [employer] that Prior Authorization for Ozempic  (1 MG/DOSE) 4MG /3ML pen-injectors  has been APPROVED from 10/23/23 to 10/22/24. Unable to obtain price due to refill too soon rejection, last fill date 10/25/23 next available fill date09/14/25   PA #/Case ID/Reference #: [id number]

## 2023-11-24 ENCOUNTER — Encounter: Payer: Self-pay | Admitting: Family Medicine

## 2023-11-24 ENCOUNTER — Ambulatory Visit: Payer: Self-pay | Admitting: Family Medicine

## 2023-11-24 ENCOUNTER — Ambulatory Visit: Admitting: Family Medicine

## 2023-11-24 VITALS — BP 124/80 | HR 85 | Temp 97.3°F | Ht 58.5 in | Wt 171.6 lb

## 2023-11-24 DIAGNOSIS — Z6839 Body mass index (BMI) 39.0-39.9, adult: Secondary | ICD-10-CM

## 2023-11-24 DIAGNOSIS — Z7984 Long term (current) use of oral hypoglycemic drugs: Secondary | ICD-10-CM

## 2023-11-24 DIAGNOSIS — I10 Essential (primary) hypertension: Secondary | ICD-10-CM | POA: Diagnosis not present

## 2023-11-24 DIAGNOSIS — E66812 Obesity, class 2: Secondary | ICD-10-CM

## 2023-11-24 DIAGNOSIS — Z7985 Long-term (current) use of injectable non-insulin antidiabetic drugs: Secondary | ICD-10-CM

## 2023-11-24 DIAGNOSIS — Z23 Encounter for immunization: Secondary | ICD-10-CM

## 2023-11-24 DIAGNOSIS — E782 Mixed hyperlipidemia: Secondary | ICD-10-CM | POA: Diagnosis not present

## 2023-11-24 DIAGNOSIS — E119 Type 2 diabetes mellitus without complications: Secondary | ICD-10-CM

## 2023-11-24 LAB — HEMOGLOBIN A1C: Hgb A1c MFr Bld: 6.8 % — ABNORMAL HIGH (ref 4.6–6.5)

## 2023-11-24 LAB — GLUCOSE, RANDOM: Glucose, Bld: 90 mg/dL (ref 70–99)

## 2023-11-24 MED ORDER — SEMAGLUTIDE (2 MG/DOSE) 8 MG/3ML ~~LOC~~ SOPN
2.0000 mg | PEN_INJECTOR | SUBCUTANEOUS | 3 refills | Status: AC
Start: 2023-11-24 — End: ?

## 2023-11-24 NOTE — Progress Notes (Signed)
 [facility] [address] [place] [address] Dept: [phone] Dept Fax: [phone]  Chronic Care Office Visit  Subjective:    Patient ID: [name], female    DOB: 1965-10-21, 58 y.o..   MRN: [id number]  Chief Complaint  Patient presents with   Diabetes    3 month f/u DM/HTN.  Fasting today.   No concerns.  Flu shot today.    History of Present Illness:  Patient is in today for reassessment of chronic medical issues.  [name] has a history of hypertension. She is managed on olmesartan -HCTZ (Benicar  HCT) 40-12.5 mg daily.    [name] has a history of Type 2 diabetes. She is managed on semaglutide  (Ozempic ) 1 mg weekly and metformin  500 mg daily.  She is interested in increasing her Ozempic  dose and stopping her metformin .   [name] has a history of hyperlipidemia. She is managed on rosuvastatin  10 mg daily.  Past Medical History: Patient Active Problem List   Diagnosis Date Noted   Meralgia paraesthetica, right 08/18/2023   Tension headache 08/18/2023   Nonintractable headache 12/08/2022   Depression with anxiety 09/27/2020   Migraine headache 09/27/2020   Insomnia 09/27/2020   Thyroid  nodule- benign follicular 09/27/2020   Class 2 obesity- Initial BMI 39.1 03/09/2019   Acute right-sided low back pain with right-sided sciatica 06/10/2017   Trochanteric bursitis, right hip 05/27/2017   Atypical chest pain 11/18/2012   Type 2 diabetes mellitus (HCC) 11/18/2012   Essential hypertension 11/18/2012   Hyperlipidemia    Past Surgical History:  Procedure Laterality Date   COMBINED HYSTEROSCOPY DIAGNOSTIC / D&C  yrs ago   DIAGNOSTIC LAPAROSCOPY  1993   with laser adhesions   DILITATION & CURRETTAGE/HYSTROSCOPY WITH NOVASURE ABLATION N/A 11/04/2019   Procedure: DILATATION & CURETTAGE/HYSTEROSCOPY WITH NOVASURE ABLATION;  Surgeon: Arnaldo Purchase, MD;  Location: [facility];   Service: Gynecology;  Laterality: N/A;   INTRAUTERINE DEVICE INSERTION     mirena -Inserted 05-16-14   KNEE SURGERY Left yrs ago   meniscurs tear repair   mirena  removed  2021   OOPHORECTOMY  2008   left   ROTATOR CUFF REPAIR Right yrs ago   SHOULDER ARTHROSCOPY WITH DISTAL CLAVICLE RESECTION Left 10/03/2022   Procedure: SHOULDER ARTHROSCOPY WITH DISTAL CLAVICLE RESECTION CAPSULE RELEASE, EXTENSIVE DEBRIDEMENT;  Surgeon: Sharl Selinda Dover, MD;  Location: [facility] ;  Service: Orthopedics;  Laterality: Left;   WRIST SURGERY Right    gang. cyst   Family History  Problem Relation Age of Onset   Hypertension Mother    Diabetes Mother    COPD Mother    Cancer Mother        Lung   Hypertension Father    Stroke Father 42   Diabetes Sister    Hypertension Brother    Diabetes Maternal Aunt    Cancer Maternal Aunt        Kidney   Kidney disease Maternal Aunt    Diabetes Maternal Aunt    Stroke Maternal Grandfather    Diabetes Maternal Grandfather    Colon cancer Neg Hx    Esophageal cancer Neg Hx    Rectal cancer Neg Hx    Stomach cancer Neg Hx    Outpatient Medications Prior to Visit  Medication Sig Dispense Refill   Ascorbic Acid (VITAMIN C) 1000 MG tablet Take 1,000 mg by mouth daily.     cholecalciferol (VITAMIN D3) 25 MCG (1000 UNIT) tablet Take 1,000  Units by mouth daily.     clonazePAM  (KLONOPIN ) 0.5 MG tablet Take 1 tablet (0.5 mg total) by mouth daily as needed for anxiety. 30 tablet 0   Multiple Vitamin (MULTIVITAMIN) tablet Take 1 tablet by mouth daily. Gummy     olmesartan -hydrochlorothiazide  (BENICAR  HCT) 40-12.5 MG tablet Take 1 tablet by mouth daily. 90 tablet 3   ondansetron  (ZOFRAN ) 4 MG tablet Take 1 tablet (4 mg total) by mouth every 8 (eight) hours as needed for nausea or vomiting. 20 tablet 0   PARoxetine  (PAXIL ) 20 MG tablet TAKE 1 TABLET BY MOUTH EVERY DAY 90 tablet 3   rosuvastatin  (CRESTOR ) 10 MG tablet Take 1 tablet (10 mg total) by mouth  daily. 90 tablet 3   zinc gluconate 50 MG tablet Take 50 mg by mouth daily.     metFORMIN  (GLUCOPHAGE ) 500 MG tablet Take 1 tablet (500 mg total) by mouth daily with breakfast. 90 tablet 3   Semaglutide , 1 MG/DOSE, 4 MG/3ML SOPN Inject 1 mg as directed once a week. 9 mL 3   No facility-administered medications prior to visit.   Allergies  Allergen Reactions   Dilaudid  [Hydromorphone  Hcl] Other (See Comments)    Broke in sweat and started shaking, can take oral   Sulfa Antibiotics Hives   Tylox [Oxycodone -Acetaminophen ] Nausea And Vomiting    Can take plain tylenol    Clindamycin/Lincomycin Rash    hives   Objective:   Today's Vitals   11/24/23 0853  BP: 124/80  Pulse: 85  Temp: (!) 97.3 F (36.3 C)  TempSrc: Temporal  SpO2: 97%  Weight: 171 lb 9.6 oz (77.8 kg)  Height: 4' 10.5 (1.486 m)   Body mass index is 35.25 kg/m.   General: Well developed, well nourished. No acute distress. Psych: Alert and oriented. Normal mood and affect.  Health Maintenance Due  Topic Date Due   Hepatitis B Vaccines 19-59 Average Risk (1 of 3 - 19+ 3-dose series) Never done   Influenza Vaccine  10/02/2023     Assessment & Plan:   Problem List Items Addressed This Visit       Cardiovascular and Mediastinum   Essential hypertension - Primary   Blood pressure is in good control. Continue olmesartan -HCTZ (Benicar  HCT) 40-12.5 mg daily.         Endocrine   Type 2 diabetes mellitus (HCC)   We will check an A1c today. This was meeting goals as of her last check 3 months ago. I am willing to try to get her to monotherapy with semaglutide  (Ozempic ). increasing her dose to 2 mg weekly. Once she starts the new dose, she can stop her metformin  use.      Relevant Medications   Semaglutide , 2 MG/DOSE, 8 MG/3ML SOPN   Other Relevant Orders   Glucose, random   Hemoglobin A1c     Other   Class 2 obesity- Initial BMI 39.1   Maximum weight: 195 lbs (02/2020) Current weight: 171 lbs Weight  change since last visit: - 8 lbs Total weight loss: - 24 lbs (12.3 %)  Ongoing weight loss associated with semaglutide . I will increase her dose to 2 mg weekly. Continue to encourage regular physical activity.      Relevant Medications   Semaglutide , 2 MG/DOSE, 8 MG/3ML SOPN   Hyperlipidemia   Other Visit Diagnoses       Need for immunization against influenza       Relevant Orders   Flu vaccine trivalent PF, 6mos and older(Flulaval,Afluria,Fluarix,Fluzone) (Completed)  Return in about 3 months (around 02/23/2024) for Reassessment.   Garnette CHRISTELLA Simpler, MD

## 2023-11-24 NOTE — Assessment & Plan Note (Signed)
 Blood pressure is in good control. Continue olmesartan -HCTZ (Benicar  HCT) 40-12.5 mg daily.

## 2023-11-24 NOTE — Assessment & Plan Note (Signed)
 We will check an A1c today. This was meeting goals as of her last check 3 months ago. I am willing to try to get her to monotherapy with semaglutide  (Ozempic ). increasing her dose to 2 mg weekly. Once she starts the new dose, she can stop her metformin  use.

## 2023-11-24 NOTE — Assessment & Plan Note (Signed)
 Maximum weight: 195 lbs (02/2020) Current weight: 171 lbs Weight change since last visit: - 8 lbs Total weight loss: - 24 lbs (12.3 %)  Ongoing weight loss associated with semaglutide . I will increase her dose to 2 mg weekly. Continue to encourage regular physical activity.

## 2023-12-03 LAB — HM DIABETES EYE EXAM

## 2023-12-04 ENCOUNTER — Encounter: Payer: Self-pay | Admitting: Family Medicine

## 2023-12-21 ENCOUNTER — Other Ambulatory Visit: Payer: Self-pay | Admitting: Family Medicine

## 2023-12-21 DIAGNOSIS — Z1231 Encounter for screening mammogram for malignant neoplasm of breast: Secondary | ICD-10-CM

## 2024-01-07 ENCOUNTER — Ambulatory Visit
Admission: RE | Admit: 2024-01-07 | Discharge: 2024-01-07 | Disposition: A | Source: Ambulatory Visit | Attending: Family Medicine

## 2024-01-07 DIAGNOSIS — Z1231 Encounter for screening mammogram for malignant neoplasm of breast: Secondary | ICD-10-CM

## 2024-01-08 ENCOUNTER — Ambulatory Visit: Admitting: Nurse Practitioner

## 2024-01-08 ENCOUNTER — Encounter: Payer: Self-pay | Admitting: Nurse Practitioner

## 2024-01-08 VITALS — BP 136/78 | HR 104 | Temp 99.5°F | Ht 59.0 in | Wt 168.9 lb

## 2024-01-08 DIAGNOSIS — J0140 Acute pansinusitis, unspecified: Secondary | ICD-10-CM

## 2024-01-08 DIAGNOSIS — J209 Acute bronchitis, unspecified: Secondary | ICD-10-CM

## 2024-01-08 LAB — POCT INFLUENZA A/B
Influenza A, POC: NEGATIVE
Influenza B, POC: NEGATIVE

## 2024-01-08 LAB — POC COVID19 BINAXNOW: SARS Coronavirus 2 Ag: NEGATIVE

## 2024-01-08 MED ORDER — ALBUTEROL SULFATE HFA 108 (90 BASE) MCG/ACT IN AERS: 2.0000 | INHALATION_SPRAY | Freq: Four times a day (QID) | RESPIRATORY_TRACT | 0 refills | Status: AC | PRN

## 2024-01-08 MED ORDER — AMOXICILLIN-POT CLAVULANATE 875-125 MG PO TABS: 1.0000 | ORAL_TABLET | Freq: Two times a day (BID) | ORAL | 0 refills | Status: DC

## 2024-01-08 MED ORDER — PROMETHAZINE-DM 6.25-15 MG/5ML PO SYRP: 5.0000 mL | ORAL_SOLUTION | Freq: Three times a day (TID) | ORAL | 0 refills | Status: DC | PRN

## 2024-01-08 NOTE — Patient Instructions (Signed)
 Negative flu and COVID.  Encourage adequate oral hydration. Avoid decongestants if you have high blood pressure.  Ok to use Coricidin HBP for chest and sinus congestion Use mucinex DM or Robitussin  or delsym for cough without sinus congestion  You can use plain Tylenol  or Advil  for fever, chills and achyness. Use cool mist humidifier at bedtime to help with nasal congestion and cough.  Cold/cough medications may have tylenol  or ibuprofen  or guaifenesin or dextromethophan in them, so be careful not to take beyond the recommended dose for each of these medications.

## 2024-01-08 NOTE — Progress Notes (Signed)
 Acute Office Visit  Subjective:    Patient ID: [name], female    DOB: 06-11-1965, 58 y.o.   MRN: [id number]  Chief Complaint  Patient presents with   Cough    Started Monday cough     Cough This is a new problem. The current episode started in the past 7 days. The problem has been unchanged. The problem occurs constantly. The cough is Productive of sputum. Associated symptoms include chest pain, chills, a fever, headaches, myalgias, nasal congestion, postnasal drip, rhinorrhea, a sore throat and shortness of breath. Pertinent negatives include no ear congestion, ear pain, heartburn, hemoptysis, rash, sweats, weight loss or wheezing. The symptoms are aggravated by lying down. She has tried OTC cough suppressant for the symptoms. The treatment provided no relief. There is no history of asthma, bronchiectasis, bronchitis, COPD, emphysema, environmental allergies or pneumonia.   Outpatient Medications Prior to Visit  Medication Sig   Ascorbic Acid (VITAMIN C) 1000 MG tablet Take 1,000 mg by mouth daily.   cholecalciferol (VITAMIN D3) 25 MCG (1000 UNIT) tablet Take 1,000 Units by mouth daily.   clonazePAM  (KLONOPIN ) 0.5 MG tablet Take 1 tablet (0.5 mg total) by mouth daily as needed for anxiety.   Multiple Vitamin (MULTIVITAMIN) tablet Take 1 tablet by mouth daily. Gummy   olmesartan -hydrochlorothiazide  (BENICAR  HCT) 40-12.5 MG tablet Take 1 tablet by mouth daily.   ondansetron  (ZOFRAN ) 4 MG tablet Take 1 tablet (4 mg total) by mouth every 8 (eight) hours as needed for nausea or vomiting.   PARoxetine  (PAXIL ) 20 MG tablet TAKE 1 TABLET BY MOUTH EVERY DAY   rosuvastatin  (CRESTOR ) 10 MG tablet Take 1 tablet (10 mg total) by mouth daily.   Semaglutide , 2 MG/DOSE, 8 MG/3ML SOPN Inject 2 mg as directed once a week.   zinc gluconate 50 MG tablet Take 50 mg by mouth daily.   No facility-administered medications prior to visit.    Reviewed past medical and social history.  Review  of Systems  Constitutional:  Positive for chills and fever. Negative for weight loss.  HENT:  Positive for postnasal drip, rhinorrhea and sore throat. Negative for ear pain.   Respiratory:  Positive for cough and shortness of breath. Negative for hemoptysis and wheezing.   Cardiovascular:  Positive for chest pain.  Gastrointestinal:  Negative for heartburn.  Musculoskeletal:  Positive for myalgias.  Skin:  Negative for rash.  Allergic/Immunologic: Negative for environmental allergies.  Neurological:  Positive for headaches.   Per HPI     Objective:    Physical Exam Vitals and nursing note reviewed.  Constitutional:      General: She is not in acute distress. HENT:     Right Ear: Tympanic membrane, ear canal and external ear normal.     Left Ear: Tympanic membrane, ear canal and external ear normal.     Nose: Congestion and rhinorrhea present. No nasal tenderness or mucosal edema.     Right Nostril: No occlusion.     Left Nostril: No occlusion.     Right Turbinates: Not enlarged, swollen or pale.     Left Turbinates: Not enlarged, swollen or pale.     Right Sinus: No maxillary sinus tenderness or frontal sinus tenderness.     Left Sinus: No maxillary sinus tenderness or frontal sinus tenderness.     Mouth/Throat:     Pharynx: Oropharynx is clear. Uvula midline. Posterior oropharyngeal erythema present. No oropharyngeal exudate.     Tonsils: No tonsillar exudate or tonsillar abscesses.  Eyes:     Extraocular Movements: Extraocular movements intact.     Conjunctiva/sclera: Conjunctivae normal.  Cardiovascular:     Rate and Rhythm: Normal rate and regular rhythm.     Pulses: Normal pulses.     Heart sounds: Normal heart sounds.  Pulmonary:     Effort: Pulmonary effort is normal.     Breath sounds: Normal breath sounds.  Musculoskeletal:     Cervical back: Normal range of motion and neck supple.  Lymphadenopathy:     Cervical: No cervical adenopathy.  Neurological:      Mental Status: She is alert and oriented to person, place, and time.  Psychiatric:        Mood and Affect: Mood normal.        Behavior: Behavior normal.        Thought Content: Thought content normal.    BP 136/78 (BP Location: Left Arm, Patient Position: Sitting, Cuff Size: Large)   Pulse (!) 104   Temp 99.5 F (37.5 C) (Oral)   Ht 4' 11 (1.499 m)   Wt 168 lb 14.4 oz (76.6 kg)   LMP  (LMP Unknown)   SpO2 96%   BMI 34.11 kg/m    No results found for any visits on 01/08/24.     Assessment & Plan:   Problem List Items Addressed This Visit   None Visit Diagnoses       Acute bronchitis, unspecified organism    -  Primary   Relevant Medications   albuterol (VENTOLIN HFA) 108 (90 Base) MCG/ACT inhaler   promethazine -dextromethorphan (PROMETHAZINE -DM) 6.25-15 MG/5ML syrup   Other Relevant Orders   POCT Influenza A/B   POC COVID-19     Acute non-recurrent pansinusitis       Relevant Medications   promethazine -dextromethorphan (PROMETHAZINE -DM) 6.25-15 MG/5ML syrup   amoxicillin -clavulanate (AUGMENTIN ) 875-125 MG tablet   Other Relevant Orders   POCT Influenza A/B   POC COVID-19        Meds ordered this encounter  Medications   albuterol (VENTOLIN HFA) 108 (90 Base) MCG/ACT inhaler    Sig: Inhale 2 puffs into the lungs every 6 (six) hours as needed.    Dispense:  8 g    Refill:  0    Supervising Provider:   BERNETA FALLOW ALFRED [[id number]]   promethazine -dextromethorphan (PROMETHAZINE -DM) 6.25-15 MG/5ML syrup    Sig: Take 5 mLs by mouth 3 (three) times daily as needed for cough.    Dispense:  118 mL    Refill:  0    Supervising Provider:   BERNETA FALLOW ALFRED [[id number]]   amoxicillin -clavulanate (AUGMENTIN ) 875-125 MG tablet    Sig: Take 1 tablet by mouth 2 (two) times daily.    Dispense:  20 tablet    Refill:  0    Supervising Provider:   BERNETA FALLOW SAYRE [[id number]]   Return if symptoms worsen or fail to improve.    Roselie Mood, NP

## 2024-01-11 ENCOUNTER — Encounter: Payer: Self-pay | Admitting: Family Medicine

## 2024-01-11 ENCOUNTER — Ambulatory Visit: Admitting: Emergency Medicine

## 2024-01-11 ENCOUNTER — Encounter: Payer: Self-pay | Admitting: Emergency Medicine

## 2024-01-11 ENCOUNTER — Ambulatory Visit: Payer: Self-pay

## 2024-01-11 VITALS — BP 128/82 | HR 65 | Temp 99.3°F | Resp 18 | Ht 59.0 in | Wt 168.0 lb

## 2024-01-11 DIAGNOSIS — J020 Streptococcal pharyngitis: Secondary | ICD-10-CM

## 2024-01-11 DIAGNOSIS — J029 Acute pharyngitis, unspecified: Secondary | ICD-10-CM

## 2024-01-11 DIAGNOSIS — J069 Acute upper respiratory infection, unspecified: Secondary | ICD-10-CM

## 2024-01-11 LAB — POCT RAPID STREP A (OFFICE): Rapid Strep A Screen: POSITIVE — AB

## 2024-01-11 NOTE — Telephone Encounter (Signed)
 FYI Only or Action Required?: FYI only for provider: appointment scheduled on 11/10.  Patient was last seen in primary care on 01/08/2024 by Nche, Roselie Rockford, NP.  Called Nurse Triage reporting Cough.  Symptoms began a week ago.  Interventions attempted: Prescription medications: Inhaler, amoxicillin , promethazine .  Symptoms are: gradually worsening.  Triage Disposition: See Physician Within 24 Hours  Patient/caregiver understands and will follow disposition?: Yes          Copied from CRM [id number]. Topic: Clinical - Red Word Triage >> Jan 11, 2024  2:15 PM Eva FALCON wrote: Red Word that prompted transfer to Nurse Triage: really bad facial pain, bad cough, congestion, greenish/yellowish mucus when coughing. Reason for Disposition  [1] Continuous (nonstop) coughing interferes with work or school AND [2] no improvement using cough treatment per Care Advice  Answer Assessment - Initial Assessment Questions 1. ONSET: When did the cough begin?      X 1 week   2. SEVERITY: How bad is the cough today?      Constant cough  3. SPUTUM: Describe the color of your sputum (e.g., none, dry cough; clear, white, yellow, green)      greenish/yellowish   4. HEMOPTYSIS: Are you coughing up any blood? If Yes, ask: How much? (e.g., flecks, streaks, tablespoons, etc.)     No   5. DIFFICULTY BREATHING: Are you having difficulty breathing? If Yes, ask: How bad is it? (e.g., mild, moderate, severe)      No   6. FEVER: Do you have a fever? If Yes, ask: What is your temperature, how was it measured, and when did it start?     No   7. CARDIAC HISTORY: Do you have any history of heart disease? (e.g., heart attack, congestive heart failure)  No       8. LUNG HISTORY: Do you have any history of lung disease?  (e.g., pulmonary embolus, asthma, emphysema) No   10. OTHER SYMPTOMS: Do you have any other symptoms? (e.g., runny nose, wheezing, chest pain) No    Patient  called in with complaints of really sinus pain, cough, congestion, greenish/yellowish mucus when coughing. Patient seen in office on 11/7, for Acute bronchitis' symptoms persist with Prescription medications: Inhaler, amoxicillin , promethazine . Appointment scheduled for evaluation. Patient agrees with plan of care, and will call back if anything changes, or if symptoms worsen.  Protocols used: Cough - Acute Productive-A-AH

## 2024-01-11 NOTE — Telephone Encounter (Signed)
 Seen today by Corean.  Dm/cma

## 2024-01-11 NOTE — Progress Notes (Unsigned)
 Assessment & Plan:  1. Strep throat (Primary) - POCT rapid strep A  2. Acute URI  3. Sore throat - CBC w/Diff - Mononucleosis screen  RSS positive, but patient already 72h into augmentin  therapy. Consider false pos, colonization, or less likely resistance to augmentin . Likely not primary source of her current symptoms given cough/rhinorrhea. Allergy to clindamycin. Consider switch to cefdinir if no other clear causative agent found.   URI sx, now about 1 week into illness feeling worse. Poss secondary viral illness, abrs resistance to augmentin . Cont supportive care  Labs pending as above Lungs clear no hypoxia, no SOB- no indication at this time that her presentation represents bacterial PNA.     Corean Geralds, MSPAS, PA-C   Subjective:  HPI: [name] is a 58 y.o. female with history of  has a past medical history of Abnormal uterine bleeding (AUB), Anxiety, Arthritis, Diabetes mellitus, Elevated cholesterol, Endometrial polyp, Endometrial polyp, Endometriosis, Frozen shoulder, Hypertension, Migraines, Nodule, and Ovarian cyst. presenting on 01/11/2024 for Headache (Headache, facial pressure, coughing, sore throat and neck pain x 7 days- sx are worsening. Negative COVID and flu Friday 11/7.) On augmentin , also was rx inhaler and prometh -dm syrup 3d ago by another provider but not getting any better. Feeling worse in the last 24h- ache, facial pressure, ST. Denies CP and does not feel like breathing is getting any worse, but is concerned about possibility of developing PNA.  +known sick contact (toddler) 3d before sx started initially.    ROS: Negative unless specifically indicated above in HPI.   Relevant past medical history reviewed and updated as indicated.   Allergies and medications reviewed and updated.   Current Outpatient Medications:    albuterol (VENTOLIN HFA) 108 (90 Base) MCG/ACT inhaler, Inhale 2 puffs into the lungs every 6 (six) hours as  needed., Disp: 8 g, Rfl: 0   amoxicillin -clavulanate (AUGMENTIN ) 875-125 MG tablet, Take 1 tablet by mouth 2 (two) times daily., Disp: 20 tablet, Rfl: 0   Ascorbic Acid (VITAMIN C) 1000 MG tablet, Take 1,000 mg by mouth daily., Disp: , Rfl:    cholecalciferol (VITAMIN D3) 25 MCG (1000 UNIT) tablet, Take 1,000 Units by mouth daily., Disp: , Rfl:    clonazePAM  (KLONOPIN ) 0.5 MG tablet, Take 1 tablet (0.5 mg total) by mouth daily as needed for anxiety., Disp: 30 tablet, Rfl: 0   Multiple Vitamin (MULTIVITAMIN) tablet, Take 1 tablet by mouth daily. Gummy, Disp: , Rfl:    olmesartan -hydrochlorothiazide  (BENICAR  HCT) 40-12.5 MG tablet, Take 1 tablet by mouth daily., Disp: 90 tablet, Rfl: 3   ondansetron  (ZOFRAN ) 4 MG tablet, Take 1 tablet (4 mg total) by mouth every 8 (eight) hours as needed for nausea or vomiting., Disp: 20 tablet, Rfl: 0   PARoxetine  (PAXIL ) 20 MG tablet, TAKE 1 TABLET BY MOUTH EVERY DAY, Disp: 90 tablet, Rfl: 3   promethazine -dextromethorphan (PROMETHAZINE -DM) 6.25-15 MG/5ML syrup, Take 5 mLs by mouth 3 (three) times daily as needed for cough., Disp: 118 mL, Rfl: 0   rosuvastatin  (CRESTOR ) 10 MG tablet, Take 1 tablet (10 mg total) by mouth daily., Disp: 90 tablet, Rfl: 3   Semaglutide , 2 MG/DOSE, 8 MG/3ML SOPN, Inject 2 mg as directed once a week., Disp: 9 mL, Rfl: 3   zinc gluconate 50 MG tablet, Take 50 mg by mouth daily., Disp: , Rfl:   Allergies  Allergen Reactions   Dilaudid  [Hydromorphone  Hcl] Other (See Comments)    Broke in sweat and started shaking, can take oral  Sulfa Antibiotics Hives   Tylox [Oxycodone -Acetaminophen ] Nausea And Vomiting    Can take plain tylenol    Clindamycin/Lincomycin Rash    hives      Objective:   Vitals:   01/11/24 1520  BP: 128/82  Pulse: 65  Temp: 99.3 F (37.4 C)  Resp: 18  Height: 4' 11 (1.499 m)  Weight: 168 lb (76.2 kg)  SpO2: 99%  BMI (Calculated): 33.91      Gen: appears fatigued, sl ill non-toxic, alert Ears:  Right canal normal. Right TM dull, retracted. Left canal normal. Left TM dull, retracted.  Nose: mucosal congestion and mucosal erythema Mouth: Oral mucosa moist. Throat: red, no exudate or ulcerations no soft palate fullness or retropharyngeal edema. Normal phonation.  Neck: supple mild bilateral anterior cervical LAD Heart RRR Lungs: Respiratory effort: normal.  clear to auscultation, no wheezes, rales, or rhonchi Skin: Warm and dry without acute rash to exposed areas.

## 2024-01-11 NOTE — Telephone Encounter (Signed)
Please see message below from pt. Thank you!

## 2024-01-12 LAB — CBC WITH DIFFERENTIAL/PLATELET
Basophils Absolute: 0.1 K/uL (ref 0.0–0.1)
Basophils Relative: 1.1 % (ref 0.0–3.0)
Eosinophils Absolute: 0.1 K/uL (ref 0.0–0.7)
Eosinophils Relative: 0.8 % (ref 0.0–5.0)
HCT: 40 % (ref 36.0–46.0)
Hemoglobin: 13.5 g/dL (ref 12.0–15.0)
Lymphocytes Relative: 33.3 % (ref 12.0–46.0)
Lymphs Abs: 2.4 K/uL (ref 0.7–4.0)
MCHC: 33.6 g/dL (ref 30.0–36.0)
MCV: 80.7 fl (ref 78.0–100.0)
Monocytes Absolute: 0.6 K/uL (ref 0.1–1.0)
Monocytes Relative: 8.4 % (ref 3.0–12.0)
Neutro Abs: 4.1 K/uL (ref 1.4–7.7)
Neutrophils Relative %: 56.4 % (ref 43.0–77.0)
Platelets: 263 K/uL (ref 150.0–400.0)
RBC: 4.96 Mil/uL (ref 3.87–5.11)
RDW: 14 % (ref 11.5–15.5)
WBC: 7.3 K/uL (ref 4.0–10.5)

## 2024-01-12 LAB — MONONUCLEOSIS SCREEN: Heterophile, Mono Screen: POSITIVE — AB

## 2024-01-13 ENCOUNTER — Ambulatory Visit: Payer: Self-pay | Admitting: Emergency Medicine

## 2024-01-13 MED ORDER — CEFDINIR 300 MG PO CAPS: 300.0000 mg | ORAL_CAPSULE | Freq: Two times a day (BID) | ORAL | 0 refills | Status: AC

## 2024-01-13 NOTE — Telephone Encounter (Signed)
 There's not a specific timeline to return to work- we tell people when note feverish and when feeling improved ok to return.

## 2024-02-29 ENCOUNTER — Ambulatory Visit: Admitting: Family Medicine

## 2024-02-29 ENCOUNTER — Encounter: Payer: Self-pay | Admitting: Family Medicine

## 2024-02-29 ENCOUNTER — Ambulatory Visit: Payer: Self-pay | Admitting: Family Medicine

## 2024-02-29 VITALS — BP 124/76 | HR 83 | Temp 97.7°F | Ht 59.0 in | Wt 169.2 lb

## 2024-02-29 DIAGNOSIS — E66812 Obesity, class 2: Secondary | ICD-10-CM | POA: Diagnosis not present

## 2024-02-29 DIAGNOSIS — Z7985 Long-term (current) use of injectable non-insulin antidiabetic drugs: Secondary | ICD-10-CM | POA: Diagnosis not present

## 2024-02-29 DIAGNOSIS — R3989 Other symptoms and signs involving the genitourinary system: Secondary | ICD-10-CM | POA: Diagnosis not present

## 2024-02-29 DIAGNOSIS — I10 Essential (primary) hypertension: Secondary | ICD-10-CM | POA: Diagnosis not present

## 2024-02-29 DIAGNOSIS — Z7984 Long term (current) use of oral hypoglycemic drugs: Secondary | ICD-10-CM | POA: Diagnosis not present

## 2024-02-29 DIAGNOSIS — E119 Type 2 diabetes mellitus without complications: Secondary | ICD-10-CM | POA: Diagnosis not present

## 2024-02-29 DIAGNOSIS — E782 Mixed hyperlipidemia: Secondary | ICD-10-CM | POA: Diagnosis not present

## 2024-02-29 DIAGNOSIS — Z6839 Body mass index (BMI) 39.0-39.9, adult: Secondary | ICD-10-CM | POA: Diagnosis not present

## 2024-02-29 DIAGNOSIS — J069 Acute upper respiratory infection, unspecified: Secondary | ICD-10-CM | POA: Diagnosis not present

## 2024-02-29 LAB — URINALYSIS, ROUTINE W REFLEX MICROSCOPIC
Bilirubin Urine: NEGATIVE
Ketones, ur: NEGATIVE
Nitrite: NEGATIVE
Specific Gravity, Urine: 1.01 (ref 1.000–1.030)
Total Protein, Urine: NEGATIVE
Urine Glucose: NEGATIVE
Urobilinogen, UA: 0.2 (ref 0.0–1.0)
pH: 7 (ref 5.0–8.0)

## 2024-02-29 LAB — HEMOGLOBIN A1C: Hgb A1c MFr Bld: 6.2 % (ref 4.6–6.5)

## 2024-02-29 LAB — GLUCOSE, RANDOM: Glucose, Bld: 81 mg/dL (ref 70–99)

## 2024-02-29 NOTE — Assessment & Plan Note (Signed)
 We will check an A1c today. Continue semaglutide  (Ozempic ) 2 mg weekly.

## 2024-02-29 NOTE — Progress Notes (Signed)
 " [facility] [address] [place] [address] Dept: [phone] Dept Fax: [phone]  Chronic Care Office Visit  Subjective:    Patient ID: [name], female    DOB: May 10, 1965, 58 y.o..   MRN: [id number]  Chief Complaint  Patient presents with   Hypertension    F/u HTN/DM.  Fasting today.  C/o having sinus congestion.  Taking NyQuil and Alka-Seltzer.     History of Present Illness:  Patient is in today for reassessment of chronic medical conditions.   Ms. Quickel has a history of hypertension. She is managed on olmesartan -HCTZ (Benicar  HCT) 40-12.5 mg daily.    Ms. Teasdale has a history of Type 2 diabetes. She is managed on semaglutide  (Ozempic ) 2 mg weekly. At her last visit, we discontinued her metformin  500 mg daily, at the same time as increasing her Ozempic  to 2 mg weekly.   Ms. Scheiderer has a history of hyperlipidemia. She is managed on rosuvastatin  10 mg daily.  Ms. Fuentes notes a 6-day history of nasal congestion, rhinorrhea, and PND. She has used Nyquil and Alka-Seltzer  Plus for symptomatic management. She denies sore throat, fever, or cough.  Ms. Raburn has noted a change int he color of her urine. she is taking a daily shot of a beet extract.   Past Medical History: Patient Active Problem List   Diagnosis Date Noted   Meralgia paraesthetica, right 08/18/2023   Tension headache 08/18/2023   Nonintractable headache 12/08/2022   Depression with anxiety 09/27/2020   Migraine headache 09/27/2020   Insomnia 09/27/2020   Thyroid  nodule- benign follicular 09/27/2020   Class 2 obesity- Initial BMI 39.1 03/09/2019   Acute right-sided low back pain with right-sided sciatica 06/10/2017   Trochanteric bursitis, right hip 05/27/2017   Atypical chest pain 11/18/2012   Type 2 diabetes mellitus (HCC) 11/18/2012   Essential hypertension 11/18/2012   Hyperlipidemia    Past Surgical History:   Procedure Laterality Date   COMBINED HYSTEROSCOPY DIAGNOSTIC / D&C  yrs ago   DIAGNOSTIC LAPAROSCOPY  1993   with laser adhesions   DILITATION & CURRETTAGE/HYSTROSCOPY WITH NOVASURE ABLATION N/A 11/04/2019   Procedure: DILATATION & CURETTAGE/HYSTEROSCOPY WITH NOVASURE ABLATION;  Surgeon: Arnaldo Purchase, MD;  Location: [facility];  Service: Gynecology;  Laterality: N/A;   INTRAUTERINE DEVICE INSERTION     mirena -Inserted 05-16-14   KNEE SURGERY Left yrs ago   meniscurs tear repair   mirena  removed  2021   OOPHORECTOMY  2008   left   ROTATOR CUFF REPAIR Right yrs ago   SHOULDER ARTHROSCOPY WITH DISTAL CLAVICLE RESECTION Left 10/03/2022   Procedure: SHOULDER ARTHROSCOPY WITH DISTAL CLAVICLE RESECTION CAPSULE RELEASE, EXTENSIVE DEBRIDEMENT;  Surgeon: Sharl Selinda Dover, MD;  Location: [facility];  Service: Orthopedics;  Laterality: Left;   WRIST SURGERY Right    gang. cyst   Family History  Problem Relation Age of Onset   Hypertension Mother    Diabetes Mother    COPD Mother    Cancer Mother        Lung   Hypertension Father    Stroke Father 72   Diabetes Sister    Hypertension Brother    Diabetes Maternal Aunt    Cancer Maternal Aunt        Kidney   Kidney disease Maternal Aunt    Diabetes Maternal Aunt    Stroke Maternal Grandfather    Diabetes Maternal Grandfather    Colon cancer Neg Hx  Esophageal cancer Neg Hx    Rectal cancer Neg Hx    Stomach cancer Neg Hx    Outpatient Medications Prior to Visit  Medication Sig Dispense Refill   albuterol  (VENTOLIN  HFA) 108 (90 Base) MCG/ACT inhaler Inhale 2 puffs into the lungs every 6 (six) hours as needed. 8 g 0   Ascorbic Acid (VITAMIN C) 1000 MG tablet Take 1,000 mg by mouth daily.     cholecalciferol (VITAMIN D3) 25 MCG (1000 UNIT) tablet Take 1,000 Units by mouth daily.     clonazePAM  (KLONOPIN ) 0.5 MG tablet Take 1 tablet (0.5 mg total) by mouth daily as needed for anxiety. 30 tablet 0    Multiple Vitamin (MULTIVITAMIN) tablet Take 1 tablet by mouth daily. Gummy     olmesartan -hydrochlorothiazide  (BENICAR  HCT) 40-12.5 MG tablet Take 1 tablet by mouth daily. 90 tablet 3   ondansetron  (ZOFRAN ) 4 MG tablet Take 1 tablet (4 mg total) by mouth every 8 (eight) hours as needed for nausea or vomiting. 20 tablet 0   PARoxetine  (PAXIL ) 20 MG tablet TAKE 1 TABLET BY MOUTH EVERY DAY 90 tablet 3   promethazine -dextromethorphan (PROMETHAZINE -DM) 6.25-15 MG/5ML syrup Take 5 mLs by mouth 3 (three) times daily as needed for cough. 118 mL 0   rosuvastatin  (CRESTOR ) 10 MG tablet Take 1 tablet (10 mg total) by mouth daily. 90 tablet 3   Semaglutide , 2 MG/DOSE, 8 MG/3ML SOPN Inject 2 mg as directed once a week. 9 mL 3   zinc gluconate 50 MG tablet Take 50 mg by mouth daily.     No facility-administered medications prior to visit.   Allergies[1]   Objective:   Today's Vitals   02/29/24 0918  BP: 124/76  Pulse: 83  Temp: 97.7 F (36.5 C)  TempSrc: Temporal  SpO2: 98%  Weight: 169 lb 3.2 oz (76.7 kg)  Height: 4' 11 (1.499 m)   Body mass index is 34.17 kg/m.   General: Well developed, well nourished. No acute distress. HEENT: Normocephalic, non-traumatic. PERRL, EOMI. Conjunctiva clear. Nose with mild congestion and rhinorrhea. No pain on   percussion over the sinuses.  Mucous membranes moist. Oropharynx clear. Good dentition. Psych: Alert and oriented. Normal mood and affect.  Health Maintenance Due  Topic Date Due   Hepatitis B Vaccines 19-59 Average Risk (1 of 3 - 19+ 3-dose series) Never done   Imaging MM 3D SCREENING MAMMOGRAM BILATERAL BREAST Result Date: 01/10/2024 IMPRESSION: No mammographic evidence of malignancy.  BI-RADS CATEGORY  1: Negative.   Lab Results Lab Results  Component Value Date   HGBA1C 6.8 (H) 11/24/2023   Lipid Panel:  Lab Results  Component Value Date   CHOL 136 05/19/2023   HDL 61.70 05/19/2023   LDLCALC 61 05/19/2023   TRIG 66.0 05/19/2023     Assessment & Plan:   Problem List Items Addressed This Visit       Cardiovascular and Mediastinum   Essential hypertension   Blood pressure is in good control. Continue olmesartan -HCTZ (Benicar  HCT) 40-12.5 mg daily.         Respiratory   Viral URI   Discussed home care for viral illness, including rest, pushing fluids, and OTC medications as needed for symptom relief. Recommend hot tea with honey for sore throat symptoms. Follow-up if needed for worsening or persistent symptoms.         Endocrine   Type 2 diabetes mellitus (HCC) - Primary   We will check an A1c today. Continue semaglutide  (Ozempic ) 2 mg weekly.  Relevant Orders   Glucose, random   Hemoglobin A1c     Other   Class 2 obesity- Initial BMI 39.1   Maximum weight: 195 lbs (02/2020) Current weight: 169 lbs Weight change since last visit: - 2 lbs Total weight loss: - 26 lbs (13.3 %)  Ongoing weight loss associated with semaglutide  2 mg weekly. Continue to encourage regular physical activity.      Hyperlipidemia   Lipids are at goal. Continue rosuvastatin  10 mg daily.      Other Visit Diagnoses       Abnormal urine color       I will check a UA today. Likely due to the use of a beet concentrate.   Relevant Orders   Urinalysis, Routine w reflex microscopic     Long term current use of oral hypoglycemic drug         Long-term current use of injectable noninsulin antidiabetic medication           Return in about 3 months (around 05/29/2024) for Reassessment.   Garnette CHRISTELLA Simpler, MD  I,Emily Lagle,acting as a scribe for Garnette CHRISTELLA Simpler, MD.,have documented all relevant documentation on the behalf of Garnette CHRISTELLA Simpler, MD.  I, Garnette CHRISTELLA Simpler, MD, have reviewed all documentation for this visit. The documentation on 02/29/2024 for the exam, diagnosis, procedures, and orders are all accurate and complete.     [1]  Allergies Allergen Reactions   Dilaudid  [Hydromorphone  Hcl] Other (See Comments)     Broke in sweat and started shaking, can take oral   Sulfa Antibiotics Hives   Tylox [Oxycodone -Acetaminophen ] Nausea And Vomiting    Can take plain tylenol    Clindamycin/Lincomycin Rash    hives   "

## 2024-02-29 NOTE — Assessment & Plan Note (Addendum)
 Maximum weight: 195 lbs (02/2020) Current weight: 169 lbs Weight change since last visit: - 2 lbs Total weight loss: - 26 lbs (13.3 %)  Ongoing weight loss associated with semaglutide  2 mg weekly. Continue to encourage regular physical activity.

## 2024-02-29 NOTE — Assessment & Plan Note (Signed)
 Lipids are at goal. Continue rosuvastatin 10 mg daily.

## 2024-02-29 NOTE — Assessment & Plan Note (Signed)
 Discussed home care for viral illness, including rest, pushing fluids, and OTC medications as needed for symptom relief. Recommend hot tea with honey for sore throat symptoms. Follow-up if needed for worsening or persistent symptoms.

## 2024-02-29 NOTE — Assessment & Plan Note (Signed)
 Blood pressure is in good control. Continue olmesartan -HCTZ (Benicar  HCT) 40-12.5 mg daily.

## 2024-04-27 ENCOUNTER — Ambulatory Visit: Payer: 59 | Admitting: Obstetrics and Gynecology

## 2024-05-30 ENCOUNTER — Ambulatory Visit: Admitting: Family Medicine

## 2024-06-02 ENCOUNTER — Ambulatory Visit: Admitting: Internal Medicine

## 2024-06-07 ENCOUNTER — Ambulatory Visit: Admitting: Internal Medicine
